# Patient Record
Sex: Female | Born: 1950 | Race: White | Hispanic: No | Smoking: Current every day smoker
Health system: Southern US, Community
[De-identification: ages and names within clinical notes are randomized; demographics above are authoritative.]

## PROBLEM LIST (undated history)

## (undated) DIAGNOSIS — J9601 Acute respiratory failure with hypoxia: Secondary | ICD-10-CM

## (undated) DIAGNOSIS — I700 Atherosclerosis of aorta: Secondary | ICD-10-CM

## (undated) DIAGNOSIS — D509 Iron deficiency anemia, unspecified: Secondary | ICD-10-CM

## (undated) DIAGNOSIS — M8580 Other specified disorders of bone density and structure, unspecified site: Secondary | ICD-10-CM

## (undated) DIAGNOSIS — J45909 Unspecified asthma, uncomplicated: Secondary | ICD-10-CM

## (undated) DIAGNOSIS — D638 Anemia in other chronic diseases classified elsewhere: Secondary | ICD-10-CM

## (undated) DIAGNOSIS — J189 Pneumonia, unspecified organism: Secondary | ICD-10-CM

## (undated) DIAGNOSIS — G5773 Causalgia of bilateral lower limbs: Secondary | ICD-10-CM

## (undated) DIAGNOSIS — I739 Peripheral vascular disease, unspecified: Secondary | ICD-10-CM

## (undated) DIAGNOSIS — F32A Depression, unspecified: Secondary | ICD-10-CM

## (undated) DIAGNOSIS — M810 Age-related osteoporosis without current pathological fracture: Secondary | ICD-10-CM

## (undated) DIAGNOSIS — G9341 Metabolic encephalopathy: Secondary | ICD-10-CM

## (undated) DIAGNOSIS — R112 Nausea with vomiting, unspecified: Secondary | ICD-10-CM

## (undated) DIAGNOSIS — I10 Essential (primary) hypertension: Secondary | ICD-10-CM

## (undated) DIAGNOSIS — J449 Chronic obstructive pulmonary disease, unspecified: Secondary | ICD-10-CM

## (undated) DIAGNOSIS — R0609 Other forms of dyspnea: Secondary | ICD-10-CM

## (undated) DIAGNOSIS — F329 Major depressive disorder, single episode, unspecified: Secondary | ICD-10-CM

## (undated) DIAGNOSIS — Z7982 Long term (current) use of aspirin: Secondary | ICD-10-CM

## (undated) DIAGNOSIS — F419 Anxiety disorder, unspecified: Secondary | ICD-10-CM

## (undated) DIAGNOSIS — R1310 Dysphagia, unspecified: Secondary | ICD-10-CM

## (undated) DIAGNOSIS — Z9889 Other specified postprocedural states: Secondary | ICD-10-CM

## (undated) DIAGNOSIS — K5790 Diverticulosis of intestine, part unspecified, without perforation or abscess without bleeding: Secondary | ICD-10-CM

## (undated) DIAGNOSIS — G5771 Causalgia of right lower limb: Secondary | ICD-10-CM

## (undated) DIAGNOSIS — E785 Hyperlipidemia, unspecified: Secondary | ICD-10-CM

## (undated) DIAGNOSIS — I255 Ischemic cardiomyopathy: Secondary | ICD-10-CM

## (undated) DIAGNOSIS — Z7902 Long term (current) use of antithrombotics/antiplatelets: Secondary | ICD-10-CM

## (undated) DIAGNOSIS — K7460 Unspecified cirrhosis of liver: Secondary | ICD-10-CM

## (undated) DIAGNOSIS — I219 Acute myocardial infarction, unspecified: Secondary | ICD-10-CM

## (undated) HISTORY — PX: CARDIAC SURGERY: SHX584

## (undated) HISTORY — DX: Depression, unspecified: F32.A

## (undated) HISTORY — DX: Major depressive disorder, single episode, unspecified: F32.9

## (undated) HISTORY — DX: Causalgia of right lower limb: G57.71

## (undated) HISTORY — PX: CORONARY ANGIOPLASTY: SHX604

## (undated) HISTORY — PX: CARDIAC CATHETERIZATION: SHX172

## (undated) HISTORY — PX: PARTIAL HYSTERECTOMY: SHX80

## (undated) HISTORY — PX: ABDOMINAL HYSTERECTOMY: SHX81

## (undated) HISTORY — PX: REDUCTION MAMMAPLASTY: SUR839

---

## 2004-06-05 ENCOUNTER — Inpatient Hospital Stay: Payer: Self-pay

## 2004-06-05 ENCOUNTER — Other Ambulatory Visit: Payer: Self-pay

## 2004-06-05 DIAGNOSIS — I214 Non-ST elevation (NSTEMI) myocardial infarction: Secondary | ICD-10-CM

## 2004-06-05 HISTORY — DX: Non-ST elevation (NSTEMI) myocardial infarction: I21.4

## 2004-06-06 ENCOUNTER — Other Ambulatory Visit: Payer: Self-pay

## 2004-06-07 DIAGNOSIS — I5030 Unspecified diastolic (congestive) heart failure: Secondary | ICD-10-CM

## 2004-06-07 HISTORY — DX: Unspecified diastolic (congestive) heart failure: I50.30

## 2004-06-08 ENCOUNTER — Other Ambulatory Visit: Payer: Self-pay

## 2004-06-08 DIAGNOSIS — I2510 Atherosclerotic heart disease of native coronary artery without angina pectoris: Secondary | ICD-10-CM

## 2004-06-08 HISTORY — DX: Atherosclerotic heart disease of native coronary artery without angina pectoris: I25.10

## 2004-07-07 ENCOUNTER — Encounter: Payer: Self-pay | Admitting: Internal Medicine

## 2004-07-29 ENCOUNTER — Encounter: Payer: Self-pay | Admitting: Internal Medicine

## 2004-11-03 ENCOUNTER — Ambulatory Visit: Payer: Self-pay | Admitting: Obstetrics and Gynecology

## 2004-11-08 ENCOUNTER — Ambulatory Visit: Payer: Self-pay | Admitting: Obstetrics and Gynecology

## 2005-01-09 ENCOUNTER — Other Ambulatory Visit: Payer: Self-pay

## 2005-01-09 ENCOUNTER — Emergency Department: Payer: Self-pay | Admitting: General Practice

## 2006-05-11 ENCOUNTER — Ambulatory Visit: Payer: Self-pay

## 2006-08-11 ENCOUNTER — Ambulatory Visit: Payer: Self-pay | Admitting: Obstetrics and Gynecology

## 2008-09-05 ENCOUNTER — Ambulatory Visit: Payer: Self-pay | Admitting: Internal Medicine

## 2009-02-11 ENCOUNTER — Ambulatory Visit: Payer: Self-pay | Admitting: Obstetrics and Gynecology

## 2009-03-20 ENCOUNTER — Ambulatory Visit: Payer: Self-pay | Admitting: Internal Medicine

## 2010-03-16 ENCOUNTER — Emergency Department: Payer: Self-pay | Admitting: Emergency Medicine

## 2010-03-20 ENCOUNTER — Emergency Department: Payer: Self-pay | Admitting: Emergency Medicine

## 2010-04-02 ENCOUNTER — Ambulatory Visit: Payer: Self-pay | Admitting: Gastroenterology

## 2010-04-13 ENCOUNTER — Ambulatory Visit: Payer: Self-pay | Admitting: Obstetrics and Gynecology

## 2011-08-25 ENCOUNTER — Emergency Department: Payer: Self-pay | Admitting: Unknown Physician Specialty

## 2011-09-15 ENCOUNTER — Ambulatory Visit: Payer: Self-pay | Admitting: Obstetrics and Gynecology

## 2013-10-31 ENCOUNTER — Ambulatory Visit: Payer: Self-pay | Admitting: Obstetrics and Gynecology

## 2013-10-31 LAB — LIPID PANEL
CHOLESTEROL: 296 mg/dL — AB (ref 0–200)
HDL Cholesterol: 53 mg/dL (ref 40–60)
Ldl Cholesterol, Calc: 197 mg/dL — ABNORMAL HIGH (ref 0–100)
Triglycerides: 229 mg/dL — ABNORMAL HIGH (ref 0–200)
VLDL CHOLESTEROL, CALC: 46 mg/dL — AB (ref 5–40)

## 2013-10-31 LAB — CBC WITH DIFFERENTIAL/PLATELET
BASOS ABS: 0.1 10*3/uL (ref 0.0–0.1)
Basophil %: 1.1 %
EOS PCT: 3.7 %
Eosinophil #: 0.2 10*3/uL (ref 0.0–0.7)
HCT: 41.4 % (ref 35.0–47.0)
HGB: 13.6 g/dL (ref 12.0–16.0)
LYMPHS PCT: 36.8 %
Lymphocyte #: 2 10*3/uL (ref 1.0–3.6)
MCH: 30 pg (ref 26.0–34.0)
MCHC: 32.9 g/dL (ref 32.0–36.0)
MCV: 91 fL (ref 80–100)
Monocyte #: 0.3 x10 3/mm (ref 0.2–0.9)
Monocyte %: 5 %
Neutrophil #: 2.9 10*3/uL (ref 1.4–6.5)
Neutrophil %: 53.4 %
Platelet: 272 10*3/uL (ref 150–440)
RBC: 4.54 10*6/uL (ref 3.80–5.20)
RDW: 13.5 % (ref 11.5–14.5)
WBC: 5.5 10*3/uL (ref 3.6–11.0)

## 2013-10-31 LAB — COMPREHENSIVE METABOLIC PANEL
ALBUMIN: 4 g/dL (ref 3.4–5.0)
ALT: 20 U/L (ref 12–78)
Alkaline Phosphatase: 82 U/L
Anion Gap: 8 (ref 7–16)
BILIRUBIN TOTAL: 0.2 mg/dL (ref 0.2–1.0)
BUN: 10 mg/dL (ref 7–18)
Calcium, Total: 9.1 mg/dL (ref 8.5–10.1)
Chloride: 101 mmol/L (ref 98–107)
Co2: 29 mmol/L (ref 21–32)
Creatinine: 0.91 mg/dL (ref 0.60–1.30)
EGFR (African American): >60
Glucose: 96 mg/dL (ref 65–99)
Osmolality: 275 (ref 275–301)
POTASSIUM: 4.5 mmol/L (ref 3.5–5.1)
SGOT(AST): 14 U/L — ABNORMAL LOW (ref 15–37)
SODIUM: 138 mmol/L (ref 136–145)
TOTAL PROTEIN: 7.2 g/dL (ref 6.4–8.2)

## 2013-10-31 LAB — TSH: Thyroid Stimulating Horm: 2.23 u[IU]/mL

## 2014-03-26 ENCOUNTER — Ambulatory Visit: Payer: Self-pay | Admitting: General Practice

## 2014-10-02 ENCOUNTER — Encounter: Payer: Self-pay | Admitting: *Deleted

## 2015-06-09 ENCOUNTER — Other Ambulatory Visit: Payer: Self-pay | Admitting: Obstetrics and Gynecology

## 2015-06-09 DIAGNOSIS — N951 Menopausal and female climacteric states: Secondary | ICD-10-CM

## 2015-07-24 ENCOUNTER — Other Ambulatory Visit: Payer: Self-pay | Admitting: Obstetrics and Gynecology

## 2015-07-24 DIAGNOSIS — Z13820 Encounter for screening for osteoporosis: Secondary | ICD-10-CM

## 2015-08-12 ENCOUNTER — Other Ambulatory Visit
Admission: RE | Admit: 2015-08-12 | Discharge: 2015-08-12 | Disposition: A | Discharge status: Home / Self Care | Payer: PPO | Source: Ambulatory Visit | Attending: Obstetrics and Gynecology | Admitting: Obstetrics and Gynecology

## 2015-08-12 DIAGNOSIS — Z131 Encounter for screening for diabetes mellitus: Secondary | ICD-10-CM | POA: Diagnosis not present

## 2015-08-12 DIAGNOSIS — Z136 Encounter for screening for cardiovascular disorders: Secondary | ICD-10-CM | POA: Diagnosis not present

## 2015-08-12 DIAGNOSIS — Z13220 Encounter for screening for lipoid disorders: Secondary | ICD-10-CM | POA: Insufficient documentation

## 2015-08-12 DIAGNOSIS — Z1329 Encounter for screening for other suspected endocrine disorder: Secondary | ICD-10-CM | POA: Insufficient documentation

## 2015-08-12 DIAGNOSIS — Z1321 Encounter for screening for nutritional disorder: Secondary | ICD-10-CM | POA: Diagnosis not present

## 2015-08-12 DIAGNOSIS — I10 Essential (primary) hypertension: Secondary | ICD-10-CM | POA: Diagnosis not present

## 2015-08-12 LAB — COMPREHENSIVE METABOLIC PANEL
ALBUMIN: 4.1 g/dL (ref 3.5–5.0)
ALT: 17 U/L (ref 14–54)
ANION GAP: 7 (ref 5–15)
AST: 19 U/L (ref 15–41)
Alkaline Phosphatase: 82 U/L (ref 38–126)
BUN: 13 mg/dL (ref 6–20)
CO2: 28 mmol/L (ref 22–32)
Calcium: 9.3 mg/dL (ref 8.9–10.3)
Chloride: 105 mmol/L (ref 101–111)
Creatinine, Ser: 0.72 mg/dL (ref 0.44–1.00)
GFR calc Af Amer: >60 mL/min (ref >60)
GFR calc non Af Amer: >60 mL/min (ref >60)
GLUCOSE: 86 mg/dL (ref 65–99)
POTASSIUM: 4.4 mmol/L (ref 3.5–5.1)
SODIUM: 140 mmol/L (ref 135–145)
Total Bilirubin: 0.6 mg/dL (ref 0.3–1.2)
Total Protein: 7 g/dL (ref 6.5–8.1)

## 2015-08-12 LAB — LIPID PANEL
CHOL/HDL RATIO: 3.2 ratio
Cholesterol: 218 mg/dL — ABNORMAL HIGH (ref 0–200)
HDL: 69 mg/dL (ref >40)
LDL CALC: 128 mg/dL — AB (ref 0–99)
Triglycerides: 106 mg/dL (ref <150)
VLDL: 21 mg/dL (ref 0–40)

## 2015-08-12 LAB — CBC WITH DIFFERENTIAL/PLATELET
BASOS ABS: 0.1 10*3/uL (ref 0–0.1)
Basophils Relative: 1 %
EOS ABS: 0.1 10*3/uL (ref 0–0.7)
EOS PCT: 2 %
HCT: 39.2 % (ref 35.0–47.0)
Hemoglobin: 13.1 g/dL (ref 12.0–16.0)
LYMPHS ABS: 1.3 10*3/uL (ref 1.0–3.6)
Lymphocytes Relative: 26 %
MCH: 29.4 pg (ref 26.0–34.0)
MCHC: 33.5 g/dL (ref 32.0–36.0)
MCV: 88 fL (ref 80.0–100.0)
MONO ABS: 0.3 10*3/uL (ref 0.2–0.9)
Monocytes Relative: 6 %
Neutro Abs: 3.3 10*3/uL (ref 1.4–6.5)
Neutrophils Relative %: 65 %
PLATELETS: 244 10*3/uL (ref 150–440)
RBC: 4.45 MIL/uL (ref 3.80–5.20)
RDW: 14.5 % (ref 11.5–14.5)
WBC: 5.1 10*3/uL (ref 3.6–11.0)

## 2015-08-12 LAB — TSH: TSH: 2.47 u[IU]/mL (ref 0.350–4.500)

## 2015-08-13 LAB — MICROALBUMIN, URINE: MICROALB UR: 3.6 ug/mL — AB

## 2015-08-13 LAB — VITAMIN D 25 HYDROXY (VIT D DEFICIENCY, FRACTURES): Vit D, 25-Hydroxy: 15.5 ng/mL — ABNORMAL LOW (ref 30.0–100.0)

## 2015-09-10 ENCOUNTER — Other Ambulatory Visit: Payer: Self-pay

## 2015-09-10 ENCOUNTER — Ambulatory Visit: Payer: Self-pay

## 2015-09-15 DIAGNOSIS — I10 Essential (primary) hypertension: Secondary | ICD-10-CM | POA: Diagnosis not present

## 2015-10-01 DIAGNOSIS — M84361A Stress fracture, right tibia, initial encounter for fracture: Secondary | ICD-10-CM | POA: Diagnosis not present

## 2015-10-01 DIAGNOSIS — M79604 Pain in right leg: Secondary | ICD-10-CM | POA: Diagnosis not present

## 2015-10-02 ENCOUNTER — Other Ambulatory Visit: Payer: Self-pay | Admitting: Orthopedic Surgery

## 2015-10-02 DIAGNOSIS — M84361A Stress fracture, right tibia, initial encounter for fracture: Secondary | ICD-10-CM

## 2015-10-23 ENCOUNTER — Ambulatory Visit
Admission: RE | Admit: 2015-10-23 | Discharge: 2015-10-23 | Disposition: A | Discharge status: Home / Self Care | Payer: PPO | Source: Ambulatory Visit | Attending: Orthopedic Surgery | Admitting: Orthopedic Surgery

## 2015-10-23 ENCOUNTER — Ambulatory Visit
Admission: RE | Admit: 2015-10-23 | Discharge: 2015-10-23 | Disposition: A | Discharge status: Home / Self Care | Payer: PPO | Source: Ambulatory Visit | Attending: Obstetrics and Gynecology | Admitting: Obstetrics and Gynecology

## 2015-10-23 ENCOUNTER — Other Ambulatory Visit: Payer: Self-pay | Admitting: Obstetrics and Gynecology

## 2015-10-23 DIAGNOSIS — Z1231 Encounter for screening mammogram for malignant neoplasm of breast: Secondary | ICD-10-CM

## 2015-10-23 DIAGNOSIS — M85851 Other specified disorders of bone density and structure, right thigh: Secondary | ICD-10-CM | POA: Diagnosis not present

## 2015-10-23 DIAGNOSIS — M810 Age-related osteoporosis without current pathological fracture: Secondary | ICD-10-CM | POA: Diagnosis not present

## 2015-10-23 DIAGNOSIS — Z13820 Encounter for screening for osteoporosis: Secondary | ICD-10-CM

## 2015-10-23 DIAGNOSIS — M7121 Synovial cyst of popliteal space [Baker], right knee: Secondary | ICD-10-CM | POA: Diagnosis not present

## 2015-10-23 DIAGNOSIS — N951 Menopausal and female climacteric states: Secondary | ICD-10-CM

## 2015-10-23 DIAGNOSIS — M84361A Stress fracture, right tibia, initial encounter for fracture: Secondary | ICD-10-CM | POA: Insufficient documentation

## 2015-11-10 DIAGNOSIS — R209 Unspecified disturbances of skin sensation: Secondary | ICD-10-CM | POA: Diagnosis not present

## 2015-11-10 DIAGNOSIS — H608X2 Other otitis externa, left ear: Secondary | ICD-10-CM | POA: Diagnosis not present

## 2015-11-12 ENCOUNTER — Other Ambulatory Visit: Payer: Self-pay | Admitting: Obstetrics and Gynecology

## 2015-11-13 ENCOUNTER — Other Ambulatory Visit: Payer: Self-pay | Admitting: Obstetrics and Gynecology

## 2015-11-13 DIAGNOSIS — R200 Anesthesia of skin: Secondary | ICD-10-CM

## 2015-11-13 DIAGNOSIS — M79604 Pain in right leg: Secondary | ICD-10-CM

## 2015-11-13 DIAGNOSIS — R202 Paresthesia of skin: Secondary | ICD-10-CM

## 2015-11-14 ENCOUNTER — Other Ambulatory Visit: Payer: Self-pay | Admitting: Obstetrics and Gynecology

## 2015-11-14 DIAGNOSIS — R202 Paresthesia of skin: Secondary | ICD-10-CM

## 2015-11-14 DIAGNOSIS — M79604 Pain in right leg: Secondary | ICD-10-CM

## 2015-11-14 DIAGNOSIS — R200 Anesthesia of skin: Secondary | ICD-10-CM

## 2016-01-14 DIAGNOSIS — I10 Essential (primary) hypertension: Secondary | ICD-10-CM | POA: Diagnosis not present

## 2016-06-01 DIAGNOSIS — I10 Essential (primary) hypertension: Secondary | ICD-10-CM | POA: Diagnosis not present

## 2016-09-14 ENCOUNTER — Encounter: Payer: Self-pay | Admitting: Emergency Medicine

## 2016-09-14 ENCOUNTER — Emergency Department: Payer: PPO

## 2016-09-14 ENCOUNTER — Emergency Department
Admission: EM | Admit: 2016-09-14 | Discharge: 2016-09-14 | Disposition: A | Discharge status: Home / Self Care | Payer: PPO | Attending: Emergency Medicine | Admitting: Emergency Medicine

## 2016-09-14 DIAGNOSIS — I10 Essential (primary) hypertension: Secondary | ICD-10-CM | POA: Diagnosis not present

## 2016-09-14 DIAGNOSIS — R0602 Shortness of breath: Secondary | ICD-10-CM | POA: Diagnosis not present

## 2016-09-14 DIAGNOSIS — R112 Nausea with vomiting, unspecified: Secondary | ICD-10-CM | POA: Diagnosis not present

## 2016-09-14 DIAGNOSIS — J40 Bronchitis, not specified as acute or chronic: Secondary | ICD-10-CM | POA: Diagnosis not present

## 2016-09-14 DIAGNOSIS — F17200 Nicotine dependence, unspecified, uncomplicated: Secondary | ICD-10-CM | POA: Insufficient documentation

## 2016-09-14 DIAGNOSIS — R197 Diarrhea, unspecified: Secondary | ICD-10-CM | POA: Insufficient documentation

## 2016-09-14 DIAGNOSIS — R05 Cough: Secondary | ICD-10-CM | POA: Diagnosis not present

## 2016-09-14 HISTORY — DX: Age-related osteoporosis without current pathological fracture: M81.0

## 2016-09-14 HISTORY — DX: Essential (primary) hypertension: I10

## 2016-09-14 MED ORDER — PROMETHAZINE HCL 25 MG PO TABS: 25.0000 mg | ORAL_TABLET | Freq: Four times a day (QID) | ORAL | 0 refills | Status: DC | PRN

## 2016-09-14 MED ORDER — PROMETHAZINE HCL 25 MG PO TABS
25.0000 mg | ORAL_TABLET | Freq: Once | ORAL | Status: AC
  Administered 2016-09-14: 25 mg via ORAL
  Filled 2016-09-14: qty 1

## 2016-09-14 MED ORDER — AZITHROMYCIN 250 MG PO TABS
500.0000 mg | ORAL_TABLET | Freq: Once | ORAL | Status: DC
Start: 2016-09-14 — End: 2016-09-14

## 2016-09-14 MED ORDER — PREDNISONE 20 MG PO TABS
60.0000 mg | ORAL_TABLET | Freq: Once | ORAL | Status: AC
  Administered 2016-09-14: 60 mg via ORAL
  Filled 2016-09-14: qty 3

## 2016-09-14 MED ORDER — DOXYCYCLINE HYCLATE 100 MG PO CAPS: 100.0000 mg | ORAL_CAPSULE | Freq: Two times a day (BID) | ORAL | 0 refills | Status: DC

## 2016-09-14 MED ORDER — PREDNISONE 50 MG PO TABS: ORAL_TABLET | ORAL | 0 refills | Status: DC

## 2016-09-14 MED ORDER — DOXYCYCLINE HYCLATE 100 MG PO TABS
100.0000 mg | ORAL_TABLET | Freq: Once | ORAL | Status: AC
  Administered 2016-09-14: 100 mg via ORAL
  Filled 2016-09-14: qty 1

## 2016-09-14 NOTE — ED Notes (Signed)
Pt discharged home after verbalizing understanding of discharge instructions; nad noted. 

## 2016-09-14 NOTE — ED Notes (Addendum)
Pt presents with cough x 2 weeks and diarrhea x 6 days. Pt has hx of copd and is a smoker (1.5 packs per day). Pt also c/o not being able to eat for a week. She states she has had some vomiting (last time Saturday), but that she has had continual diarrhea since Wednesday. Pt states she has had 12 episodes of diarrhea in last 24 hours. Pt alert & oriented with NAD noted.  Addend: pt states she DOES NOT smoke a pack and a half of cigarettes a day; she only smokes about a pack.

## 2016-09-14 NOTE — ED Triage Notes (Signed)
Patient presents to the ED with cough and congestion with shortness of breath.  Patient states, "my grandkids have been sick lately and I'm worried I have the flu."  Patient states cough and congestion began approx. 1 week ago.  Patient denies fever.  Patient is speaking in full sentences without obvious distress.  Patient has raspy cough.  Patient also has bruising to her right forehead at this time.  Patient states approx. 2 weeks ago she walked into a door frame accidentally.  Patient denies any issues post fall.

## 2016-09-14 NOTE — ED Provider Notes (Signed)
[facility] Emergency Department Provider Note  ____________________________________________   First MD Initiated Contact with Patient 09/14/16 1614     (approximate)  I have reviewed the triage vital signs and the nursing notes.   HISTORY  Chief Complaint Shortness of Breath and Nasal Congestion   HPI Wendy Arias is a 66 y.o. female with a history of hypertension and osteoporosis was present. Emergency department today with 2 weeks of cough, nasal congestion as well as nausea, vomiting and diarrhea. She says that she only has diarrhea and nausea and vomiting when she eats. Says she has not had anything to eat or drink today but says she is ready to eat at this time and would like to when she gets home. She does request Phenergan for her nausea.  Denies any pain at this time. Says that her most concerning symptom is her shortness of breath and a cough. Says that she has been using her rescue inhaler at home. Also sick contacts of her children.   Past Medical History:  Diagnosis Date  . Hypertension   . Osteoporosis     There are no active problems to display for this patient.   Past Surgical History:  Procedure Laterality Date  . ABDOMINAL HYSTERECTOMY    . CARDIAC SURGERY    . REDUCTION MAMMAPLASTY      Prior to Admission medications   Not on File    Allergies Penicillins  Family History  Problem Relation Age of Onset  . Breast cancer Mother 38  . Breast cancer Maternal Aunt 70  . Breast cancer Other 57    Social History Social History  Substance Use Topics  . Smoking status: Current Some Day Smoker  . Smokeless tobacco: Never Used  . Alcohol use Yes     Comment: rarely    Review of Systems Constitutional: No fever/chills Eyes: No visual changes. ENT: No sore throat. Cardiovascular: Denies chest pain. Respiratory: as above Gastrointestinal: No abdominal pain.  No nausea, no vomiting.   Genitourinary: Negative for  dysuria. Musculoskeletal: Negative for back pain. Skin: Negative for rash. Neurological: Negative for headaches, focal weakness or numbness.  10-point ROS otherwise negative.  ____________________________________________   PHYSICAL EXAM:  VITAL SIGNS: ED Triage Vitals  Enc Vitals Group     BP 09/14/16 1544 (!) 120/99     Pulse Rate 09/14/16 1544 87     Resp 09/14/16 1544 18     Temp 09/14/16 1544 97.8 F (36.6 C)     Temp Source 09/14/16 1544 Oral     SpO2 09/14/16 1544 95 %     Weight 09/14/16 1545 102 lb (46.3 kg)     Height 09/14/16 1545 5\' 2"  (1.575 m)     Head Circumference --      Peak Flow --      Pain Score --      Pain Loc --      Pain Edu? --      Excl. in [place]? --     Constitutional: Alert and oriented. Well appearing and in no acute distress. Eyes: Conjunctivae are normal. PERRL. EOMI. Head: Atraumatic. Nose: No congestion/rhinnorhea. Mouth/Throat: Mucous membranes are moist.  Oropharynx non-erythematous. Neck: No stridor.   Cardiovascular: Normal rate, regular rhythm. Grossly normal heart sounds.   Respiratory: Normal respiratory effort.  No retractions. Lungs CTAB. Gastrointestinal: Soft and nontender. No distention.  Musculoskeletal: No lower extremity tenderness nor edema.  No joint effusions. Neurologic:  Normal speech and language. No gross  focal neurologic deficits are appreciated. No gait instability. Skin:  Skin is warm, dry and intact. No rash noted. Psychiatric: Mood and affect are normal. Speech and behavior are normal.  ____________________________________________   LABS (all labs ordered are listed, but only abnormal results are displayed)  Labs Reviewed - No data to display ____________________________________________  EKG   ____________________________________________  RADIOLOGY DG Chest 2 View (Final result)  Result time 09/14/16 16:46:57  Final result by Lavonia Dana, MD (09/14/16 16:46:57)           Narrative:   CLINICAL  DATA: Cough for 2 weeks with nausea and vomiting for 1 week, history COPD, hypertension, smoker, MI and stent placement  EXAM: CHEST 2 VIEW  COMPARISON: 03/26/2014  FINDINGS: Normal heart size, mediastinal contours, and pulmonary vascularity.  Atherosclerotic calcification aorta.  Linear subsegmental atelectasis versus scarring at RIGHT base.  Lungs emphysematous but otherwise clear.  No pleural effusion or pneumothorax.  Osseous demineralization without acute osseous findings  IMPRESSION: Emphysematous changes with linear atelectasis versus scarring at RIGHT lung base.  No acute infiltrate.   Electronically Signed By: Lavonia Dana M.D. On: 09/14/2016 16:46           ____________________________________________   PROCEDURES  Procedure(s) performed:   Procedures  Critical Care performed:   ____________________________________________   INITIAL IMPRESSION / ASSESSMENT AND PLAN / ED COURSE  Pertinent labs & imaging results that were available during my care of the patient were reviewed by me and considered in my medical decision making (see chart for details).  Because of the duration of the patient's symptoms I'll be treating her with doxycycline as well as prednisone and Phenergan when necessary. She says that she is ready to eat normally distention she gets home. We'll discharge to home with her son. She is understanding of the plan and willing to comply.      ____________________________________________   FINAL CLINICAL IMPRESSION(S) / ED DIAGNOSES  Bronchitis. Nausea vomiting and diarrhea.    NEW MEDICATIONS STARTED DURING THIS VISIT:  New Prescriptions   No medications on file     Note:  This document was prepared using Dragon voice recognition software and may include unintentional dictation errors.    Orbie Pyo, MD 09/14/16 [phone]

## 2016-09-21 DIAGNOSIS — I10 Essential (primary) hypertension: Secondary | ICD-10-CM | POA: Diagnosis not present

## 2016-11-30 DIAGNOSIS — R197 Diarrhea, unspecified: Secondary | ICD-10-CM | POA: Diagnosis not present

## 2017-04-12 ENCOUNTER — Encounter
Admission: RE | Disposition: A | Discharge status: Home / Self Care | Payer: Self-pay | Source: Ambulatory Visit | Attending: Gastroenterology

## 2017-04-12 ENCOUNTER — Ambulatory Visit: Payer: PPO | Admitting: Anesthesiology

## 2017-04-12 ENCOUNTER — Ambulatory Visit
Admission: RE | Admit: 2017-04-12 | Discharge: 2017-04-12 | Disposition: A | Discharge status: Home / Self Care | Payer: PPO | Source: Ambulatory Visit | Attending: Gastroenterology | Admitting: Gastroenterology

## 2017-04-12 ENCOUNTER — Encounter: Payer: Self-pay | Admitting: Anesthesiology

## 2017-04-12 DIAGNOSIS — Z90710 Acquired absence of both cervix and uterus: Secondary | ICD-10-CM | POA: Insufficient documentation

## 2017-04-12 DIAGNOSIS — I2510 Atherosclerotic heart disease of native coronary artery without angina pectoris: Secondary | ICD-10-CM | POA: Insufficient documentation

## 2017-04-12 DIAGNOSIS — R197 Diarrhea, unspecified: Secondary | ICD-10-CM | POA: Diagnosis not present

## 2017-04-12 DIAGNOSIS — K529 Noninfective gastroenteritis and colitis, unspecified: Secondary | ICD-10-CM | POA: Diagnosis not present

## 2017-04-12 DIAGNOSIS — Z7982 Long term (current) use of aspirin: Secondary | ICD-10-CM | POA: Insufficient documentation

## 2017-04-12 DIAGNOSIS — Q438 Other specified congenital malformations of intestine: Secondary | ICD-10-CM | POA: Insufficient documentation

## 2017-04-12 DIAGNOSIS — K621 Rectal polyp: Secondary | ICD-10-CM | POA: Diagnosis not present

## 2017-04-12 DIAGNOSIS — I10 Essential (primary) hypertension: Secondary | ICD-10-CM | POA: Diagnosis not present

## 2017-04-12 DIAGNOSIS — K635 Polyp of colon: Secondary | ICD-10-CM | POA: Diagnosis not present

## 2017-04-12 DIAGNOSIS — F17200 Nicotine dependence, unspecified, uncomplicated: Secondary | ICD-10-CM | POA: Insufficient documentation

## 2017-04-12 DIAGNOSIS — Z79899 Other long term (current) drug therapy: Secondary | ICD-10-CM | POA: Diagnosis not present

## 2017-04-12 DIAGNOSIS — K5730 Diverticulosis of large intestine without perforation or abscess without bleeding: Secondary | ICD-10-CM | POA: Insufficient documentation

## 2017-04-12 DIAGNOSIS — K5790 Diverticulosis of intestine, part unspecified, without perforation or abscess without bleeding: Secondary | ICD-10-CM | POA: Diagnosis not present

## 2017-04-12 DIAGNOSIS — D128 Benign neoplasm of rectum: Secondary | ICD-10-CM | POA: Diagnosis not present

## 2017-04-12 DIAGNOSIS — Z880 Allergy status to penicillin: Secondary | ICD-10-CM | POA: Diagnosis not present

## 2017-04-12 DIAGNOSIS — D123 Benign neoplasm of transverse colon: Secondary | ICD-10-CM | POA: Diagnosis not present

## 2017-04-12 DIAGNOSIS — M810 Age-related osteoporosis without current pathological fracture: Secondary | ICD-10-CM | POA: Insufficient documentation

## 2017-04-12 DIAGNOSIS — D124 Benign neoplasm of descending colon: Secondary | ICD-10-CM | POA: Diagnosis not present

## 2017-04-12 HISTORY — PX: COLONOSCOPY WITH PROPOFOL: SHX5780

## 2017-04-12 SURGERY — COLONOSCOPY WITH PROPOFOL
Anesthesia: General

## 2017-04-12 MED ORDER — LIDOCAINE HCL (PF) 2 % IJ SOLN
INTRAMUSCULAR | Status: AC
  Filled 2017-04-12: qty 10

## 2017-04-12 MED ORDER — PHENYLEPHRINE HCL 10 MG/ML IJ SOLN
INTRAMUSCULAR | Status: AC
  Filled 2017-04-12: qty 1

## 2017-04-12 MED ORDER — PROPOFOL 10 MG/ML IV BOLUS
INTRAVENOUS | Status: DC | PRN
  Administered 2017-04-12: 80 mg via INTRAVENOUS

## 2017-04-12 MED ORDER — GLYCOPYRROLATE 0.2 MG/ML IJ SOLN
INTRAMUSCULAR | Status: AC
  Filled 2017-04-12: qty 1

## 2017-04-12 MED ORDER — PROPOFOL 500 MG/50ML IV EMUL
INTRAVENOUS | Status: DC | PRN
  Administered 2017-04-12: 120 ug/kg/min via INTRAVENOUS

## 2017-04-12 MED ORDER — FENTANYL CITRATE (PF) 100 MCG/2ML IJ SOLN
INTRAMUSCULAR | Status: AC
  Filled 2017-04-12: qty 2

## 2017-04-12 MED ORDER — PROPOFOL 500 MG/50ML IV EMUL
INTRAVENOUS | Status: AC
  Filled 2017-04-12: qty 50

## 2017-04-12 MED ORDER — FENTANYL CITRATE (PF) 100 MCG/2ML IJ SOLN
INTRAMUSCULAR | Status: DC | PRN
  Administered 2017-04-12: 50 ug via INTRAVENOUS

## 2017-04-12 MED ORDER — PHENYLEPHRINE HCL 10 MG/ML IJ SOLN
INTRAMUSCULAR | Status: DC | PRN
  Administered 2017-04-12: 100 ug via INTRAVENOUS
  Administered 2017-04-12 (×2): 50 ug via INTRAVENOUS
  Administered 2017-04-12: 100 ug via INTRAVENOUS

## 2017-04-12 MED ORDER — LIDOCAINE 2% (20 MG/ML) 5 ML SYRINGE
INTRAMUSCULAR | Status: DC | PRN
  Administered 2017-04-12: 30 mg via INTRAVENOUS

## 2017-04-12 MED ORDER — SODIUM CHLORIDE 0.9 % IV SOLN
INTRAVENOUS | Status: DC
  Administered 2017-04-12: 07:00:00 via INTRAVENOUS

## 2017-04-12 MED ORDER — EPHEDRINE SULFATE 50 MG/ML IJ SOLN
INTRAMUSCULAR | Status: AC
  Filled 2017-04-12: qty 1

## 2017-04-12 MED ORDER — SODIUM CHLORIDE 0.9 % IV SOLN: INTRAVENOUS | Status: DC

## 2017-04-12 MED ORDER — PROPOFOL 10 MG/ML IV BOLUS
INTRAVENOUS | Status: AC
  Filled 2017-04-12: qty 20

## 2017-04-12 NOTE — Op Note (Signed)
[facility] Patient Name: Wendy Arias Procedure Date: 04/12/2017 7:33 AM MRN: [id number] Account #: [id number] Date of Birth: 09/19/1950 Admit Type: Outpatient Age: 66 Room: [facility] ENDO ROOM 3 Gender: Female Note Status: Finalized Procedure:            Colonoscopy Indications:          Chronic diarrhea Providers:            Lollie Sails, MD Referring MD:         Shelby Mattocks. Georga Bora, MD (Referring MD) Medicines:            Monitored Anesthesia Care Complications:        No immediate complications. Procedure:            Pre-Anesthesia Assessment:                       - ASA Grade Assessment: III - A patient with severe                        systemic disease.                       After obtaining informed consent, the colonoscope was                        passed under direct vision. Throughout the procedure,                        the patient's blood pressure, pulse, and oxygen                        saturations were monitored continuously. The                        Colonoscope was introduced through the anus and                        advanced to the the terminal ileum. The colonoscopy was                        unusually difficult due to a tortuous colon. Successful                        completion of the procedure was aided by changing the                        patient to a supine position, changing the patient to a                        prone position, using manual pressure and withdrawing                        and reinserting the scope. The patient tolerated the                        procedure well. The quality of the bowel preparation                        was fair. Findings:      Multiple small to medium-mouthed diverticula were found in the  sigmoid       colon and descending colon.      Biopsies for histology were taken with a cold forceps from the right       colon and left colon for evaluation of microscopic colitis.  Two sessile polyps were found in the transverse colon. The polyps were 3       to 4 mm in size. These polyps were removed with a cold snare. Resection       and retrieval were complete.      A 6 mm polyp was found in the transverse colon. The polyp was sessile.       The polyp was removed with a cold snare. Resection and retrieval were       complete.      A 7 mm polyp was found in the descending colon. The polyp was sessile.       The polyp was removed with a cold snare. Resection and retrieval were       complete.      Three sessile polyps were found in the rectum. The polyps were 1 to 3 mm       in size. These polyps were removed with a cold biopsy forceps. Resection       and retrieval were complete. Impression:           - Preparation of the colon was fair.                       - Diverticulosis in the sigmoid colon and in the                        descending colon.                       - Two 3 to 4 mm polyps in the transverse colon, removed                        with a cold snare. Resected and retrieved.                       - One 6 mm polyp in the transverse colon, removed with                        a cold snare. Resected and retrieved.                       - One 7 mm polyp in the descending colon, removed with                        a cold snare. Resected and retrieved.                       - Three 1 to 3 mm polyps in the rectum, removed with a                        cold biopsy forceps. Resected and retrieved.                       - Biopsies were taken with a cold forceps from the  right colon and left colon for evaluation of                        microscopic colitis. Recommendation:       - Telephone GI clinic for pathology results in 1 week. Procedure Code(s):    --- Professional ---                       [phone], Colonoscopy, flexible; with removal of tumor(s),                        polyp(s), or other lesion(s) by snare technique                        45380, 29, Colonoscopy, flexible; with biopsy, single                        or multiple Diagnosis Code(s):    --- Professional ---                       D12.3, Benign neoplasm of transverse colon (hepatic                        flexure or splenic flexure)                       K62.1, Rectal polyp                       D12.4, Benign neoplasm of descending colon                       K52.9, Noninfective gastroenteritis and colitis,                        unspecified                       K57.30, Diverticulosis of large intestine without                        perforation or abscess without bleeding CPT copyright 2016 American Medical [employer]. All rights reserved. The codes documented in this report are preliminary and upon coder review may  be revised to meet current compliance requirements. Lollie Sails, MD 04/12/2017 8:41:52 AM This report has been signed electronically. Number of Addenda: 0 Note Initiated On: 04/12/2017 7:33 AM Scope Withdrawal Time: 0 hours 21 minutes 8 seconds  Total Procedure Duration: 0 hours 47 minutes 32 seconds       [facility]

## 2017-04-12 NOTE — H&P (Signed)
Outpatient short stay form Pre-procedure 04/12/2017 7:33 AM Wendy Sails MD  Primary Physician: Dr. Quay Burow  Reason for visit:  colonoscopy  History of present illness:  Patient is a 66 year old female presenting today as above. She has a history of chronic intermittent diarrhea. There seems to be multiple dietary triggers but at times anythiing cause her episode of She does not see blood in the stools. She does have some abdominal discomfort crampiness prior to the bowel movement this relieves with the bowel movement.  Patient tolerated her prep well. She does take a 325 mg aspirin daily but has not for at least a week to 2 weeks. She denies taking any Plavix although this is listed as one of her medications. She denies taking any other aspirin products.    Current Facility-Administered Medications:  .  0.9 %  sodium chloride infusion, , Intravenous, Continuous, Wendy Sails, MD, Last Rate: 20 mL/hr at 04/12/17 0724 .  0.9 %  sodium chloride infusion, , Intravenous, Continuous, Wendy Sails, MD  Prescriptions Prior to Admission  Medication Sig Dispense Refill Last Dose  . ALPRAZolam (XANAX) 1 MG tablet Take 1 mg by mouth 2 (two) times daily as needed for anxiety.   Past Month at Unknown time  . aspirin 325 MG tablet Take 325 mg by mouth daily.   Past Month at Unknown time  . ibandronate (BONIVA) 150 MG tablet Take 150 mg by mouth every 30 (thirty) days. Take in the morning with a full glass of water, on an empty stomach, and do not take anything else by mouth or lie down for the next 30 min.   03/13/2017  . metoprolol succinate (TOPROL-XL) 50 MG 24 hr tablet Take 50 mg by mouth daily. Take with or immediately following a meal.   04/11/2017 at Unknown time  . simvastatin (ZOCOR) 40 MG tablet Take 40 mg by mouth daily.   alprazolam  . vitamin B-12 (CYANOCOBALAMIN) 100 MCG tablet Take 100 mcg by mouth daily.   04/01/2017  . doxycycline (VIBRAMYCIN) 100 MG capsule  Take 1 capsule (100 mg total) by mouth 2 (two) times daily. 20 capsule 0   . predniSONE (DELTASONE) 50 MG tablet Take 1 tab PO daily for 5 days (Patient not taking: Reported on 04/12/2017) 5 tablet 0 Not Taking at Unknown time  . promethazine (PHENERGAN) 25 MG tablet Take 1 tablet (25 mg total) by mouth every 6 (six) hours as needed for nausea or vomiting. (Patient not taking: Reported on 04/12/2017) 15 tablet 0 Not Taking at Unknown time     Allergies  Allergen Reactions  . Penicillins      Past Medical History:  Diagnosis Date  . Hypertension   . Osteoporosis     Review of systems:      Physical Exam    Heart and lungs: regular rate and rhythm without rub or gallop, lungs are bilaterally clear.    HEENT: normocephalic atraumatic eyes are anicteric    Other:     Pertinant exam for procedure: soft nontender nondistended bowel sounds positive normoactive    Planned proceedures: colonoscopy and indicated procedures. I have discussed the risks benefits and complications of procedures to include not limited to bleeding, infection, perforation and the risk of sedation and the patient wishes to proceed.    Wendy Sails, MD Gastroenterology 04/12/2017  7:33 AM

## 2017-04-12 NOTE — Transfer of Care (Signed)
Immediate Anesthesia Transfer of Care Note  Patient: Wendy Arias  Procedure(s) Performed: COLONOSCOPY WITH PROPOFOL (N/A )  Patient Location: PACU and Endoscopy Unit  Anesthesia Type:General  Level of Consciousness: awake and drowsy  Airway & Oxygen Therapy: Patient Spontanous Breathing and Patient connected to nasal cannula oxygen  Post-op Assessment: Report given to RN and Post -op Vital signs reviewed and stable  Post vital signs: Reviewed and stable  Last Vitals:  Vitals:   04/12/17 0708  BP: (!) 158/87  Pulse: 87  Resp: 18  Temp: (!) 35.7 C  SpO2: 97%    Last Pain:  Vitals:   04/12/17 0708  TempSrc: Tympanic         Complications: No apparent anesthesia complications

## 2017-04-12 NOTE — Anesthesia Post-op Follow-up Note (Signed)
Anesthesia QCDR form completed.        

## 2017-04-12 NOTE — Anesthesia Postprocedure Evaluation (Signed)
Anesthesia Post Note  Patient: SHERMAN LIPUMA  Procedure(s) Performed: COLONOSCOPY WITH PROPOFOL (N/A )  Patient location during evaluation: Endoscopy Anesthesia Type: General Level of consciousness: awake and alert Pain management: pain level controlled Vital Signs Assessment: post-procedure vital signs reviewed and stable Respiratory status: spontaneous breathing, nonlabored ventilation, respiratory function stable and patient connected to nasal cannula oxygen Cardiovascular status: blood pressure returned to baseline and stable Postop Assessment: no apparent nausea or vomiting Anesthetic complications: no     Last Vitals:  Vitals:   04/12/17 0901 04/12/17 0911  BP: (!) 152/106 (!) 130/103  Pulse: 68 70  Resp: (!) 22 18  Temp:    SpO2: 99% 98%    Last Pain:  Vitals:   04/12/17 0841  TempSrc: Tympanic                 Precious Haws Nel Stoneking

## 2017-04-12 NOTE — Anesthesia Preprocedure Evaluation (Signed)
Anesthesia Evaluation  Patient identified by MRN, date of birth, ID band Patient awake    Reviewed: Allergy & Precautions, H&P , NPO status , Patient's Chart, lab work & pertinent test results  History of Anesthesia Complications Negative for: history of anesthetic complications  Airway Mallampati: III  TM Distance: <3 FB Neck ROM: limited    Dental  (+) Poor Dentition, Chipped, Missing, Upper Dentures, Partial Lower   Pulmonary neg shortness of breath, Current Smoker,           Cardiovascular Exercise Tolerance: Good hypertension, (-) angina+ CAD and + Cardiac Stents  (-) DOE      Neuro/Psych negative neurological ROS  negative psych ROS   GI/Hepatic negative GI ROS, Neg liver ROS, neg GERD  ,  Endo/Other  negative endocrine ROS  Renal/GU negative Renal ROS  negative genitourinary   Musculoskeletal   Abdominal   Peds  Hematology negative hematology ROS (+)   Anesthesia Other Findings Past Medical History: No date: Hypertension No date: Osteoporosis  Past Surgical History: No date: ABDOMINAL HYSTERECTOMY No date: CARDIAC SURGERY No date: REDUCTION MAMMAPLASTY  BMI    Body Mass Index:  18.66 kg/m      Reproductive/Obstetrics negative OB ROS                             Anesthesia Physical Anesthesia Plan  ASA: III  Anesthesia Plan: General   Post-op Pain Management:    Induction: Intravenous  PONV Risk Score and Plan: Propofol infusion  Airway Management Planned: Natural Airway and Nasal Cannula  Additional Equipment:   Intra-op Plan:   Post-operative Plan:   Informed Consent: I have reviewed the patients History and Physical, chart, labs and discussed the procedure including the risks, benefits and alternatives for the proposed anesthesia with the patient or authorized representative who has indicated his/her understanding and acceptance.   Dental Advisory  Given  Plan Discussed with: Anesthesiologist, CRNA and Surgeon  Anesthesia Plan Comments: (Patient consented for risks of anesthesia including but not limited to:  - adverse reactions to medications - risk of intubation if required - damage to teeth, lips or other oral mucosa - sore throat or hoarseness - Damage to heart, brain, lungs or loss of life  Patient voiced understanding.)        Anesthesia Quick Evaluation

## 2017-04-13 ENCOUNTER — Encounter: Payer: Self-pay | Admitting: Gastroenterology

## 2017-04-14 LAB — SURGICAL PATHOLOGY

## 2017-04-27 ENCOUNTER — Other Ambulatory Visit: Payer: Self-pay | Admitting: Obstetrics and Gynecology

## 2017-04-27 DIAGNOSIS — Z1231 Encounter for screening mammogram for malignant neoplasm of breast: Secondary | ICD-10-CM

## 2017-04-27 DIAGNOSIS — Z13820 Encounter for screening for osteoporosis: Secondary | ICD-10-CM

## 2017-05-13 ENCOUNTER — Ambulatory Visit: Payer: PPO | Admitting: Psychology

## 2017-05-13 DIAGNOSIS — F322 Major depressive disorder, single episode, severe without psychotic features: Secondary | ICD-10-CM | POA: Diagnosis not present

## 2017-05-23 ENCOUNTER — Ambulatory Visit: Payer: PPO | Admitting: Psychology

## 2017-05-30 ENCOUNTER — Ambulatory Visit: Payer: PPO | Admitting: Psychology

## 2017-05-30 DIAGNOSIS — F322 Major depressive disorder, single episode, severe without psychotic features: Secondary | ICD-10-CM | POA: Diagnosis not present

## 2017-06-01 ENCOUNTER — Ambulatory Visit: Payer: PPO | Admitting: Psychology

## 2017-06-13 ENCOUNTER — Ambulatory Visit: Payer: PPO | Admitting: Psychology

## 2017-06-13 DIAGNOSIS — F322 Major depressive disorder, single episode, severe without psychotic features: Secondary | ICD-10-CM

## 2017-06-14 DIAGNOSIS — Z87898 Personal history of other specified conditions: Secondary | ICD-10-CM | POA: Insufficient documentation

## 2017-06-14 DIAGNOSIS — K580 Irritable bowel syndrome with diarrhea: Secondary | ICD-10-CM | POA: Diagnosis not present

## 2017-06-14 DIAGNOSIS — D369 Benign neoplasm, unspecified site: Secondary | ICD-10-CM | POA: Diagnosis not present

## 2017-06-29 ENCOUNTER — Ambulatory Visit: Payer: PPO | Admitting: Psychology

## 2017-08-13 DIAGNOSIS — H6693 Otitis media, unspecified, bilateral: Secondary | ICD-10-CM | POA: Diagnosis not present

## 2017-08-13 DIAGNOSIS — B349 Viral infection, unspecified: Secondary | ICD-10-CM | POA: Diagnosis not present

## 2017-08-13 DIAGNOSIS — H6123 Impacted cerumen, bilateral: Secondary | ICD-10-CM | POA: Diagnosis not present

## 2017-08-16 ENCOUNTER — Ambulatory Visit: Payer: PPO | Admitting: Psychology

## 2017-09-09 DIAGNOSIS — I10 Essential (primary) hypertension: Secondary | ICD-10-CM | POA: Diagnosis not present

## 2017-11-02 ENCOUNTER — Encounter: Payer: Self-pay | Admitting: Emergency Medicine

## 2017-11-02 ENCOUNTER — Other Ambulatory Visit: Payer: Self-pay

## 2017-11-02 ENCOUNTER — Emergency Department: Payer: PPO

## 2017-11-02 ENCOUNTER — Inpatient Hospital Stay
Admission: EM | Admit: 2017-11-02 | Discharge: 2017-11-04 | DRG: 896 | Disposition: A | Discharge status: Home / Self Care | Payer: PPO | Attending: Specialist | Admitting: Specialist

## 2017-11-02 DIAGNOSIS — Z7982 Long term (current) use of aspirin: Secondary | ICD-10-CM

## 2017-11-02 DIAGNOSIS — R4020 Unspecified coma: Secondary | ICD-10-CM | POA: Diagnosis not present

## 2017-11-02 DIAGNOSIS — R4781 Slurred speech: Secondary | ICD-10-CM | POA: Diagnosis not present

## 2017-11-02 DIAGNOSIS — G9340 Encephalopathy, unspecified: Secondary | ICD-10-CM

## 2017-11-02 DIAGNOSIS — M810 Age-related osteoporosis without current pathological fracture: Secondary | ICD-10-CM | POA: Diagnosis present

## 2017-11-02 DIAGNOSIS — F13239 Sedative, hypnotic or anxiolytic dependence with withdrawal, unspecified: Secondary | ICD-10-CM | POA: Diagnosis not present

## 2017-11-02 DIAGNOSIS — E785 Hyperlipidemia, unspecified: Secondary | ICD-10-CM | POA: Diagnosis not present

## 2017-11-02 DIAGNOSIS — F329 Major depressive disorder, single episode, unspecified: Secondary | ICD-10-CM | POA: Diagnosis not present

## 2017-11-02 DIAGNOSIS — R443 Hallucinations, unspecified: Secondary | ICD-10-CM | POA: Diagnosis present

## 2017-11-02 DIAGNOSIS — F17200 Nicotine dependence, unspecified, uncomplicated: Secondary | ICD-10-CM | POA: Diagnosis not present

## 2017-11-02 DIAGNOSIS — F419 Anxiety disorder, unspecified: Secondary | ICD-10-CM | POA: Diagnosis present

## 2017-11-02 DIAGNOSIS — F1010 Alcohol abuse, uncomplicated: Secondary | ICD-10-CM | POA: Diagnosis not present

## 2017-11-02 DIAGNOSIS — I4581 Long QT syndrome: Secondary | ICD-10-CM | POA: Diagnosis not present

## 2017-11-02 DIAGNOSIS — F13939 Sedative, hypnotic or anxiolytic use, unspecified with withdrawal, unspecified: Secondary | ICD-10-CM

## 2017-11-02 DIAGNOSIS — R4182 Altered mental status, unspecified: Secondary | ICD-10-CM | POA: Diagnosis not present

## 2017-11-02 DIAGNOSIS — G92 Toxic encephalopathy: Secondary | ICD-10-CM | POA: Diagnosis present

## 2017-11-02 DIAGNOSIS — Z79899 Other long term (current) drug therapy: Secondary | ICD-10-CM

## 2017-11-02 DIAGNOSIS — F10239 Alcohol dependence with withdrawal, unspecified: Secondary | ICD-10-CM | POA: Diagnosis not present

## 2017-11-02 DIAGNOSIS — I10 Essential (primary) hypertension: Secondary | ICD-10-CM | POA: Diagnosis present

## 2017-11-02 DIAGNOSIS — F13231 Sedative, hypnotic or anxiolytic dependence with withdrawal delirium: Secondary | ICD-10-CM | POA: Diagnosis not present

## 2017-11-02 DIAGNOSIS — N179 Acute kidney failure, unspecified: Secondary | ICD-10-CM | POA: Diagnosis not present

## 2017-11-02 MED ORDER — HALOPERIDOL LACTATE 5 MG/ML IJ SOLN
5.0000 mg | Freq: Once | INTRAMUSCULAR | Status: AC
  Administered 2017-11-03: 5 mg via INTRAVENOUS
  Filled 2017-11-02: qty 1

## 2017-11-02 NOTE — ED Provider Notes (Addendum)
[facility] Emergency Department Provider Note  ____________________________________________   First MD Initiated Contact with Patient 11/02/17 2258     (approximate)  I have reviewed the triage vital signs and the nursing notes.   HISTORY  Chief Complaint Altered Mental Status  Level 5 exemption history limited by the patient's clinical condition  HPI Wendy Arias is a 67 y.o. female is brought to the emergency department via EMS after family noted that the patient had been demonstrating bizarre behavior for the past 3 hours or so.  The patient lives at home alone and has a long-standing history of depression.  Family found multiple prescription pill bottles open and the patient lying on the floor.  She was rather uncooperative with EMS in route.  She had normal vital signs and normal blood sugar.  Patient herself is unable to provide any meaningful history.  Past Medical History:  Diagnosis Date  . Hypertension   . Osteoporosis     Patient Active Problem List   Diagnosis Date Noted  . Acute encephalopathy 11/03/2017    Past Surgical History:  Procedure Laterality Date  . ABDOMINAL HYSTERECTOMY    . CARDIAC SURGERY    . COLONOSCOPY WITH PROPOFOL N/A 04/12/2017   Procedure: COLONOSCOPY WITH PROPOFOL;  Surgeon: Lollie Sails, MD;  Location: [facility] ENDOSCOPY;  Service: Endoscopy;  Laterality: N/A;  . REDUCTION MAMMAPLASTY      Prior to Admission medications   Medication Sig Start Date End Date Taking? Authorizing Provider  alprazolam Duanne Moron) 2 MG tablet Take 1 mg by mouth 4 (four) times daily. 10/05/17  Yes [provider]  busPIRone (BUSPAR) 10 MG tablet Take 10 mg by mouth 2 (two) times daily. 11/01/17  Yes [provider]  citalopram (CELEXA) 20 MG tablet Take 20 mg by mouth daily. 11/01/17  Yes [provider]  dicyclomine (BENTYL) 10 MG capsule Take 10 mg by mouth every 6 (six) hours as needed for spasms. 11/01/17  Yes  [provider]  metoprolol succinate (TOPROL-XL) 50 MG 24 hr tablet Take 50 mg by mouth daily. Take with or immediately following a meal.   Yes [provider]  zolpidem (AMBIEN) 10 MG tablet Take 5-10 mg by mouth at bedtime and may repeat dose one time if needed. 10mg  at bedtime, may repeat 5mg  if needed 10/05/17  Yes [provider]  aspirin 325 MG tablet Take 325 mg by mouth daily.    [provider]  ibandronate (BONIVA) 150 MG tablet Take 150 mg by mouth every 30 (thirty) days. Take in the morning with a full glass of water, on an empty stomach, and do not take anything else by mouth or lie down for the next 30 min.    [provider]  predniSONE (DELTASONE) 50 MG tablet Take 1 tab PO daily for 5 days Patient not taking: Reported on 04/12/2017 09/14/16   Orbie Pyo, MD  promethazine (PHENERGAN) 25 MG tablet Take 1 tablet (25 mg total) by mouth every 6 (six) hours as needed for nausea or vomiting. Patient not taking: Reported on 04/12/2017 09/14/16   Orbie Pyo, MD  simvastatin (ZOCOR) 40 MG tablet Take 40 mg by mouth daily.    [provider]  vitamin B-12 (CYANOCOBALAMIN) 100 MCG tablet Take 100 mcg by mouth daily.    [provider]    Allergies Penicillins  Family History  Problem Relation Age of Onset  . Breast cancer Mother 65  . Breast  cancer Maternal Aunt 70  . Breast cancer Other 60    Social History Social History   Tobacco Use  . Smoking status: Current Some Day Smoker  . Smokeless tobacco: Never Used  Substance Use Topics  . Alcohol use: Yes    Comment: rarely  . Drug use: Not on file    Review of Systems Level 5 exemption history limited by the patient's clinical condition  ____________________________________________   PHYSICAL EXAM:  VITAL SIGNS: ED Triage Vitals  Enc Vitals Group     BP      Pulse      Resp      Temp      Temp src      SpO2      Weight       Height      Head Circumference      Peak Flow      Pain Score      Pain Loc      Pain Edu?      Excl. in [place]?     Constitutional: Alert and oriented x0.  Does not know her name, year, where she is, or why she is here.  She appears frustrated and is confabulating.  She answers questions that have not been asked and seems to be making no sense Eyes: PERRL EOMI. pupils are dilated and reactive and brisk Head: Atraumatic. Nose: No congestion/rhinnorhea. Mouth/Throat: No trismus Neck: No stridor.   Cardiovascular: Tachycardic rate, regular rhythm. Grossly normal heart sounds.  Good peripheral circulation. Respiratory: Normal respiratory effort.  No retractions. Lungs CTAB and moving good air Gastrointestinal: Soft nontender Musculoskeletal: No lower extremity edema   Neurologic: Moves all 4 feels all 4.  Normal reflexes in all 4.  Confabulating Skin:  Skin is warm, dry and intact. No rash noted. Psychiatric: Encephalopathic   ____________________________________________   DIFFERENTIAL includes but not limited to  Stroke, intracerebral hemorrhage, metabolic arrangement, alcohol intoxication, therapeutic misadventure, encephalitis, alcohol withdrawal, Wernicke's encephalitis ____________________________________________   LABS (all labs ordered are listed, but only abnormal results are displayed)  Labs Reviewed  URINALYSIS, COMPLETE (UACMP) WITH MICROSCOPIC - Abnormal; Notable for the following components:      Result Value   Color, Urine YELLOW (*)    APPearance CLEAR (*)    All other components within normal limits  URINE DRUG SCREEN, QUALITATIVE ([facility] ONLY) - Abnormal; Notable for the following components:   Tricyclic, Ur Screen POSITIVE (*)    Benzodiazepine, Ur Scrn POSITIVE (*)    All other components within normal limits  COMPREHENSIVE METABOLIC PANEL - Abnormal; Notable for the following components:   Glucose, Bld 148 (*)    Creatinine, Ser 1.33 (*)    Alkaline  Phosphatase 134 (*)    GFR calc non Af Amer 40 (*)    GFR calc Af Amer 47 (*)    All other components within normal limits  ACETAMINOPHEN LEVEL - Abnormal; Notable for the following components:   Acetaminophen (Tylenol), Serum <10 (*)    All other components within normal limits  CBC WITH DIFFERENTIAL/PLATELET - Abnormal; Notable for the following components:   RDW 19.2 (*)    All other components within normal limits  CK - Abnormal; Notable for the following components:   Total CK 585 (*)    All other components within normal limits  ETHANOL  SALICYLATE LEVEL  AMMONIA  PROTIME-INR  BASIC METABOLIC PANEL  CBC    Lab work reviewed by me is positive for both tricyclics  and benzodiazepines.  Otherwise unremarkable __________________________________________  EKG  ED ECG REPORT I, Darel Hong, the attending physician, personally viewed and interpreted this ECG.  Date: 11/03/2017 EKG Time:  Rate: 97 Rhythm: normal sinus rhythm QRS Axis: normal Intervals: normal ST/T Wave abnormalities: normal Narrative Interpretation: no evidence of acute ischemia  ____________________________________________  RADIOLOGY  Head CT reviewed by me with no acute disease ____________________________________________   PROCEDURES  Procedure(s) performed: no  .Critical Care Performed by: Darel Hong, MD Authorized by: Darel Hong, MD   Critical care provider statement:    Critical care time (minutes):  35   Critical care time was exclusive of:  Separately billable procedures and treating other patients   Critical care was necessary to treat or prevent imminent or life-threatening deterioration of the following conditions:  Toxidrome and CNS failure or compromise   Critical care was time spent personally by me on the following activities:  Development of treatment plan with patient or surrogate, discussions with consultants, evaluation of patient's response to treatment,  examination of patient, obtaining history from patient or surrogate, ordering and performing treatments and interventions, ordering and review of laboratory studies, ordering and review of radiographic studies, pulse oximetry, re-evaluation of patient's condition and review of old charts    Critical Care performed: no  Observation: no ____________________________________________   INITIAL IMPRESSION / ASSESSMENT AND PLAN / ED COURSE  Pertinent labs & imaging results that were available during my care of the patient were reviewed by me and considered in my medical decision making (see chart for details).  The patient arrives tremulous, confused, somewhat agitated.  Differential is broad but includes alcohol intoxication, therapeutic misadventure with benzodiazepines, serotonin syndrome, intracerebral hemorrhage.  The patient is largely uncooperative with our exam and she will require intravenous haloperidol for her own safety to facilitate a medical work-up searching for an organic cause of her behavior.     The patient's behavior remains quite odd and she is confabulating.  She has erratic eye movements and is unsteady when attempting to get up.  According to family she has a long-standing history of chronic alcohol abuse and drinks every single day.  She does not appear to be withdrawing from alcohol and actually concerned that she might have Warnicke's encephalopathy.  At this point she requires inpatient admission for IV vitamin, magnesium, and further work-up and treatment of her altered mental status.  I had a lengthy discussion with her children who verbalized understanding and agreement with the plan.  As the patient appears to have a medical etiology of her symptoms I have lifted her involuntary commitment. ____________________________________________   FINAL CLINICAL IMPRESSION(S) / ED DIAGNOSES  Final diagnoses:  Encephalopathy      NEW MEDICATIONS STARTED DURING THIS  VISIT:  Current Discharge Medication List       Note:  This document was prepared using Dragon voice recognition software and may include unintentional dictation errors.     Darel Hong, MD 11/03/17 9675    Darel Hong, MD 11/09/17 [phone]

## 2017-11-02 NOTE — ED Triage Notes (Addendum)
Pt in from home by [facility] family called ems due to altered mental status and confusion. They told ems that she lives alone and they found pills all over the house, they are unsure of overdose. Pt alert to name and place but not answering all questions appropriately. Does state that she has had diarrhea, no vomiting. FSBS per ems 127, family stated positive etoh today.

## 2017-11-03 ENCOUNTER — Inpatient Hospital Stay: Payer: PPO

## 2017-11-03 DIAGNOSIS — Z79899 Other long term (current) drug therapy: Secondary | ICD-10-CM | POA: Diagnosis not present

## 2017-11-03 DIAGNOSIS — F13231 Sedative, hypnotic or anxiolytic dependence with withdrawal delirium: Secondary | ICD-10-CM | POA: Diagnosis not present

## 2017-11-03 DIAGNOSIS — F13239 Sedative, hypnotic or anxiolytic dependence with withdrawal, unspecified: Secondary | ICD-10-CM | POA: Diagnosis present

## 2017-11-03 DIAGNOSIS — E785 Hyperlipidemia, unspecified: Secondary | ICD-10-CM | POA: Diagnosis present

## 2017-11-03 DIAGNOSIS — F13939 Sedative, hypnotic or anxiolytic use, unspecified with withdrawal, unspecified: Secondary | ICD-10-CM

## 2017-11-03 DIAGNOSIS — F419 Anxiety disorder, unspecified: Secondary | ICD-10-CM | POA: Diagnosis present

## 2017-11-03 DIAGNOSIS — G92 Toxic encephalopathy: Secondary | ICD-10-CM | POA: Diagnosis present

## 2017-11-03 DIAGNOSIS — F17200 Nicotine dependence, unspecified, uncomplicated: Secondary | ICD-10-CM | POA: Diagnosis present

## 2017-11-03 DIAGNOSIS — F10239 Alcohol dependence with withdrawal, unspecified: Secondary | ICD-10-CM | POA: Diagnosis present

## 2017-11-03 DIAGNOSIS — N179 Acute kidney failure, unspecified: Secondary | ICD-10-CM | POA: Diagnosis present

## 2017-11-03 DIAGNOSIS — F329 Major depressive disorder, single episode, unspecified: Secondary | ICD-10-CM | POA: Diagnosis present

## 2017-11-03 DIAGNOSIS — M810 Age-related osteoporosis without current pathological fracture: Secondary | ICD-10-CM | POA: Diagnosis present

## 2017-11-03 DIAGNOSIS — G9340 Encephalopathy, unspecified: Secondary | ICD-10-CM

## 2017-11-03 DIAGNOSIS — I4581 Long QT syndrome: Secondary | ICD-10-CM | POA: Diagnosis not present

## 2017-11-03 DIAGNOSIS — R443 Hallucinations, unspecified: Secondary | ICD-10-CM | POA: Diagnosis present

## 2017-11-03 DIAGNOSIS — Z7982 Long term (current) use of aspirin: Secondary | ICD-10-CM | POA: Diagnosis not present

## 2017-11-03 DIAGNOSIS — I10 Essential (primary) hypertension: Secondary | ICD-10-CM | POA: Diagnosis present

## 2017-11-03 LAB — COMPREHENSIVE METABOLIC PANEL
ALT: 22 U/L (ref 14–54)
ANION GAP: 9 (ref 5–15)
AST: 30 U/L (ref 15–41)
Albumin: 3.9 g/dL (ref 3.5–5.0)
Alkaline Phosphatase: 134 U/L — ABNORMAL HIGH (ref 38–126)
BILIRUBIN TOTAL: 0.4 mg/dL (ref 0.3–1.2)
BUN: 17 mg/dL (ref 6–20)
CHLORIDE: 107 mmol/L (ref 101–111)
CO2: 25 mmol/L (ref 22–32)
Calcium: 8.9 mg/dL (ref 8.9–10.3)
Creatinine, Ser: 1.33 mg/dL — ABNORMAL HIGH (ref 0.44–1.00)
GFR calc non Af Amer: 40 mL/min — ABNORMAL LOW (ref >60)
GFR, EST AFRICAN AMERICAN: 47 mL/min — AB (ref >60)
Glucose, Bld: 148 mg/dL — ABNORMAL HIGH (ref 65–99)
POTASSIUM: 3.7 mmol/L (ref 3.5–5.1)
Sodium: 141 mmol/L (ref 135–145)
TOTAL PROTEIN: 7.1 g/dL (ref 6.5–8.1)

## 2017-11-03 LAB — CBC WITH DIFFERENTIAL/PLATELET
Basophils Absolute: 0 10*3/uL (ref 0–0.1)
Basophils Relative: 1 %
EOS ABS: 0 10*3/uL (ref 0–0.7)
Eosinophils Relative: 1 %
HCT: 36.6 % (ref 35.0–47.0)
HEMOGLOBIN: 12.5 g/dL (ref 12.0–16.0)
LYMPHS ABS: 1.2 10*3/uL (ref 1.0–3.6)
Lymphocytes Relative: 23 %
MCH: 32.4 pg (ref 26.0–34.0)
MCHC: 34.1 g/dL (ref 32.0–36.0)
MCV: 94.8 fL (ref 80.0–100.0)
Monocytes Absolute: 0.5 10*3/uL (ref 0.2–0.9)
Monocytes Relative: 9 %
NEUTROS PCT: 66 %
Neutro Abs: 3.6 10*3/uL (ref 1.4–6.5)
Platelets: 332 10*3/uL (ref 150–440)
RBC: 3.87 MIL/uL (ref 3.80–5.20)
RDW: 19.2 % — ABNORMAL HIGH (ref 11.5–14.5)
WBC: 5.3 10*3/uL (ref 3.6–11.0)

## 2017-11-03 LAB — BASIC METABOLIC PANEL
Anion gap: 10 (ref 5–15)
BUN: 21 mg/dL — AB (ref 6–20)
CALCIUM: 8.6 mg/dL — AB (ref 8.9–10.3)
CHLORIDE: 106 mmol/L (ref 101–111)
CO2: 23 mmol/L (ref 22–32)
CREATININE: 1.02 mg/dL — AB (ref 0.44–1.00)
GFR calc non Af Amer: 56 mL/min — ABNORMAL LOW (ref >60)
Glucose, Bld: 166 mg/dL — ABNORMAL HIGH (ref 65–99)
Potassium: 3.6 mmol/L (ref 3.5–5.1)
SODIUM: 139 mmol/L (ref 135–145)

## 2017-11-03 LAB — URINE DRUG SCREEN, QUALITATIVE (ARMC ONLY)
Amphetamines, Ur Screen: NOT DETECTED
BARBITURATES, UR SCREEN: NOT DETECTED
BENZODIAZEPINE, UR SCRN: POSITIVE — AB
CANNABINOID 50 NG, UR ~~LOC~~: NOT DETECTED
Cocaine Metabolite,Ur ~~LOC~~: NOT DETECTED
MDMA (Ecstasy)Ur Screen: NOT DETECTED
METHADONE SCREEN, URINE: NOT DETECTED
OPIATE, UR SCREEN: NOT DETECTED
PHENCYCLIDINE (PCP) UR S: NOT DETECTED
Tricyclic, Ur Screen: POSITIVE — AB

## 2017-11-03 LAB — URINALYSIS, COMPLETE (UACMP) WITH MICROSCOPIC
BILIRUBIN URINE: NEGATIVE
Bacteria, UA: NONE SEEN
Glucose, UA: NEGATIVE mg/dL
HGB URINE DIPSTICK: NEGATIVE
KETONES UR: NEGATIVE mg/dL
LEUKOCYTES UA: NEGATIVE
Nitrite: NEGATIVE
PH: 5 (ref 5.0–8.0)
Protein, ur: NEGATIVE mg/dL
Specific Gravity, Urine: 1.017 (ref 1.005–1.030)

## 2017-11-03 LAB — CBC
HCT: 35.4 % (ref 35.0–47.0)
Hemoglobin: 11.9 g/dL — ABNORMAL LOW (ref 12.0–16.0)
MCH: 32.1 pg (ref 26.0–34.0)
MCHC: 33.5 g/dL (ref 32.0–36.0)
MCV: 95.9 fL (ref 80.0–100.0)
PLATELETS: 341 10*3/uL (ref 150–440)
RBC: 3.69 MIL/uL — AB (ref 3.80–5.20)
RDW: 19.9 % — AB (ref 11.5–14.5)
WBC: 5 10*3/uL (ref 3.6–11.0)

## 2017-11-03 LAB — ACETAMINOPHEN LEVEL: Acetaminophen (Tylenol), Serum: <10 ug/mL — ABNORMAL LOW (ref 10–30)

## 2017-11-03 LAB — AMMONIA: AMMONIA: 31 umol/L (ref 9–35)

## 2017-11-03 LAB — PROTIME-INR
INR: 0.93
PROTHROMBIN TIME: 12.4 s (ref 11.4–15.2)

## 2017-11-03 LAB — CK: CK TOTAL: 585 U/L — AB (ref 38–234)

## 2017-11-03 LAB — ETHANOL

## 2017-11-03 LAB — GLUCOSE, CAPILLARY: GLUCOSE-CAPILLARY: 138 mg/dL — AB (ref 65–99)

## 2017-11-03 LAB — SALICYLATE LEVEL

## 2017-11-03 LAB — MAGNESIUM: Magnesium: 2.5 mg/dL — ABNORMAL HIGH (ref 1.7–2.4)

## 2017-11-03 MED ORDER — SIMVASTATIN 20 MG PO TABS
40.0000 mg | ORAL_TABLET | Freq: Every day | ORAL | Status: DC
  Administered 2017-11-03: 18:00:00 40 mg via ORAL
  Filled 2017-11-03: qty 2

## 2017-11-03 MED ORDER — SODIUM CHLORIDE 0.9 % IV SOLN
INTRAVENOUS | Status: DC
  Administered 2017-11-03 – 2017-11-04 (×3): via INTRAVENOUS

## 2017-11-03 MED ORDER — TRAZODONE HCL 50 MG PO TABS
25.0000 mg | ORAL_TABLET | Freq: Every evening | ORAL | Status: DC | PRN
Start: 2017-11-03 — End: 2017-11-04

## 2017-11-03 MED ORDER — LORAZEPAM 2 MG/ML IJ SOLN: 0.0000 mg | Freq: Two times a day (BID) | INTRAMUSCULAR | Status: DC

## 2017-11-03 MED ORDER — BUSPIRONE HCL 5 MG PO TABS
10.0000 mg | ORAL_TABLET | Freq: Two times a day (BID) | ORAL | Status: DC
  Administered 2017-11-03 (×2): 10 mg via ORAL
  Filled 2017-11-03: qty 1
  Filled 2017-11-03 (×3): qty 2
  Filled 2017-11-03: qty 1
  Filled 2017-11-03: qty 2

## 2017-11-03 MED ORDER — ALPRAZOLAM 1 MG PO TABS
1.0000 mg | ORAL_TABLET | Freq: Four times a day (QID) | ORAL | Status: DC
  Administered 2017-11-03 – 2017-11-04 (×4): 1 mg via ORAL
  Filled 2017-11-03 (×4): qty 1

## 2017-11-03 MED ORDER — METOPROLOL SUCCINATE ER 50 MG PO TB24
50.0000 mg | ORAL_TABLET | Freq: Every day | ORAL | Status: DC
  Administered 2017-11-03 – 2017-11-04 (×2): 50 mg via ORAL
  Filled 2017-11-03 (×2): qty 1

## 2017-11-03 MED ORDER — MAGNESIUM SULFATE 2 GM/50ML IV SOLN
2.0000 g | Freq: Once | INTRAVENOUS | Status: AC
  Administered 2017-11-03: 2 g via INTRAVENOUS
  Filled 2017-11-03: qty 50

## 2017-11-03 MED ORDER — CITALOPRAM HYDROBROMIDE 20 MG PO TABS
20.0000 mg | ORAL_TABLET | Freq: Every day | ORAL | Status: DC
  Administered 2017-11-03 – 2017-11-04 (×2): 20 mg via ORAL
  Filled 2017-11-03 (×2): qty 1

## 2017-11-03 MED ORDER — THIAMINE HCL 100 MG/ML IJ SOLN
500.0000 mg | Freq: Once | INTRAMUSCULAR | Status: AC
  Administered 2017-11-03: 500 mg via INTRAVENOUS

## 2017-11-03 MED ORDER — ASPIRIN EC 325 MG PO TBEC
325.0000 mg | DELAYED_RELEASE_TABLET | Freq: Every day | ORAL | Status: DC
  Administered 2017-11-03 – 2017-11-04 (×2): 325 mg via ORAL
  Filled 2017-11-03 (×2): qty 1

## 2017-11-03 MED ORDER — DOCUSATE SODIUM 100 MG PO CAPS
100.0000 mg | ORAL_CAPSULE | Freq: Two times a day (BID) | ORAL | Status: DC
  Administered 2017-11-03 (×2): 100 mg via ORAL
  Filled 2017-11-03 (×3): qty 1

## 2017-11-03 MED ORDER — IBANDRONATE SODIUM 150 MG PO TABS: 150.0000 mg | ORAL_TABLET | ORAL | Status: DC

## 2017-11-03 MED ORDER — HYDROCODONE-ACETAMINOPHEN 5-325 MG PO TABS: 1.0000 | ORAL_TABLET | ORAL | Status: DC | PRN

## 2017-11-03 MED ORDER — LORAZEPAM 1 MG PO TABS: 1.0000 mg | ORAL_TABLET | Freq: Four times a day (QID) | ORAL | Status: DC | PRN

## 2017-11-03 MED ORDER — ACETAMINOPHEN 650 MG RE SUPP: 650.0000 mg | Freq: Four times a day (QID) | RECTAL | Status: DC | PRN

## 2017-11-03 MED ORDER — ONDANSETRON HCL 4 MG PO TABS: 4.0000 mg | ORAL_TABLET | Freq: Four times a day (QID) | ORAL | Status: DC | PRN

## 2017-11-03 MED ORDER — DICYCLOMINE HCL 10 MG PO CAPS
10.0000 mg | ORAL_CAPSULE | Freq: Four times a day (QID) | ORAL | Status: DC | PRN
  Filled 2017-11-03: qty 1

## 2017-11-03 MED ORDER — LORAZEPAM 2 MG/ML IJ SOLN: 1.0000 mg | Freq: Four times a day (QID) | INTRAMUSCULAR | Status: DC | PRN

## 2017-11-03 MED ORDER — LORAZEPAM 2 MG/ML IJ SOLN
0.0000 mg | Freq: Four times a day (QID) | INTRAMUSCULAR | Status: DC
  Administered 2017-11-03: 2 mg via INTRAVENOUS
  Filled 2017-11-03: qty 1

## 2017-11-03 MED ORDER — BISACODYL 5 MG PO TBEC: 5.0000 mg | DELAYED_RELEASE_TABLET | Freq: Every day | ORAL | Status: DC | PRN

## 2017-11-03 MED ORDER — ZOLPIDEM TARTRATE 5 MG PO TABS
5.0000 mg | ORAL_TABLET | Freq: Every day | ORAL | Status: DC
  Administered 2017-11-03: 5 mg via ORAL
  Filled 2017-11-03: qty 1

## 2017-11-03 MED ORDER — HEPARIN SODIUM (PORCINE) 5000 UNIT/ML IJ SOLN
5000.0000 [IU] | Freq: Three times a day (TID) | INTRAMUSCULAR | Status: DC
  Administered 2017-11-03 – 2017-11-04 (×4): 5000 [IU] via SUBCUTANEOUS
  Filled 2017-11-03 (×3): qty 1

## 2017-11-03 MED ORDER — VITAMIN B-12 100 MCG PO TABS
100.0000 ug | ORAL_TABLET | Freq: Every day | ORAL | Status: DC
  Administered 2017-11-03 – 2017-11-04 (×2): 100 ug via ORAL
  Filled 2017-11-03 (×2): qty 1

## 2017-11-03 MED ORDER — THIAMINE HCL 100 MG/ML IJ SOLN
100.0000 mg | Freq: Every day | INTRAMUSCULAR | Status: DC
  Administered 2017-11-03: 100 mg via INTRAVENOUS
  Filled 2017-11-03 (×2): qty 1

## 2017-11-03 MED ORDER — ACETAMINOPHEN 325 MG PO TABS: 650.0000 mg | ORAL_TABLET | Freq: Four times a day (QID) | ORAL | Status: DC | PRN

## 2017-11-03 MED ORDER — ADULT MULTIVITAMIN W/MINERALS CH
1.0000 | ORAL_TABLET | Freq: Every day | ORAL | Status: DC
  Administered 2017-11-03: 1 via ORAL
  Filled 2017-11-03 (×2): qty 1

## 2017-11-03 MED ORDER — ONDANSETRON HCL 4 MG/2ML IJ SOLN: 4.0000 mg | Freq: Four times a day (QID) | INTRAMUSCULAR | Status: DC | PRN

## 2017-11-03 MED ORDER — FOLIC ACID 1 MG PO TABS
1.0000 mg | ORAL_TABLET | Freq: Every day | ORAL | Status: DC
  Administered 2017-11-03 – 2017-11-04 (×2): 1 mg via ORAL
  Filled 2017-11-03 (×2): qty 1

## 2017-11-03 MED ORDER — VITAMIN B-1 100 MG PO TABS
100.0000 mg | ORAL_TABLET | Freq: Every day | ORAL | Status: DC
  Administered 2017-11-04: 09:00:00 100 mg via ORAL
  Filled 2017-11-03 (×2): qty 1

## 2017-11-03 NOTE — ED Notes (Addendum)
Report called to [employer]. Pt AC we have to hold patient till sitter available due to IVC status. IVC released per Dr. Mable [place].

## 2017-11-03 NOTE — Plan of Care (Signed)

## 2017-11-03 NOTE — Progress Notes (Signed)
[place] at [place] NAME: Wendy Arias    MR#:  [id number]  DATE OF BIRTH:  11-20-1950  SUBJECTIVE:   Pt. Here due to altered mental status, confusion. Pt. Confabulating and hallucinating at times.  Patient also having twitching and abnormal movements at times.  Patient's daughter is at bedside.  REVIEW OF SYSTEMS:    Review of Systems  Unable to perform ROS: Mental acuity    Nutrition: Heart heatlhy Tolerating Diet: Yes Tolerating PT: Await Eval.    DRUG ALLERGIES:   Allergies  Allergen Reactions  . Penicillins     Has patient had a PCN reaction causing immediate rash, facial/tongue/throat swelling, SOB or lightheadedness with hypotension: Yes Has patient had a PCN reaction causing severe rash involving mucus membranes or skin necrosis: Unknown Has patient had a PCN reaction that required hospitalization: Unknown Has patient had a PCN reaction occurring within the last 10 years: Unknown If all of the above answers are "NO", then may proceed with Cephalosporin use.    VITALS:  Blood pressure 118/90, pulse 81, temperature 98.9 F (37.2 C), temperature source Axillary, resp. rate 18, height 5\' 2"  (1.575 m), weight 49.9 kg (110 lb), SpO2 95 %.  PHYSICAL EXAMINATION:   Physical Exam  GENERAL:  67 y.o.-year-old patient lying in bed confused and hallucinating but in NAD.   EYES: Pupils equal, round, reactive to light and accommodation. No scleral icterus. Extraocular muscles intact.  HEENT: Head atraumatic, normocephalic. Oropharynx and nasopharynx clear.  NECK:  Supple, no jugular venous distention. No thyroid enlargement, no tenderness.  LUNGS: Normal breath sounds bilaterally, no wheezing, rales, rhonchi. No use of accessory muscles of respiration.  CARDIOVASCULAR: S1, S2 normal. No murmurs, rubs, or gallops.  ABDOMEN: Soft, nontender, nondistended. Bowel sounds present. No organomegaly or mass.  EXTREMITIES: No cyanosis, clubbing  or edema b/l.    NEUROLOGIC: Moves all Ext. Spontaneously. Twitching, Tremor at times.  Confused, Hallucinating and confused.  PSYCHIATRIC: The patient is alert and oriented x 1.   SKIN: No obvious rash, lesion, or ulcer.    LABORATORY PANEL:   CBC Recent Labs  Lab 11/03/17 0430  WBC 5.0  HGB 11.9*  HCT 35.4  PLT 341   ------------------------------------------------------------------------------------------------------------------  Chemistries  Recent Labs  Lab 11/02/17 2343 11/03/17 0430  NA 141 139  K 3.7 3.6  CL 107 106  CO2 25 23  GLUCOSE 148* 166*  BUN 17 21*  CREATININE 1.33* 1.02*  CALCIUM 8.9 8.6*  MG  --  2.5*  AST 30  --   ALT 22  --   ALKPHOS 134*  --   BILITOT 0.4  --    ------------------------------------------------------------------------------------------------------------------  Cardiac Enzymes No results for input(s): TROPONINI in the last 168 hours. ------------------------------------------------------------------------------------------------------------------  RADIOLOGY:  Ct Head Wo Contrast  Result Date: 11/03/2017 CLINICAL DATA:  67 year old female with altered mental status. EXAM: CT HEAD WITHOUT CONTRAST TECHNIQUE: Contiguous axial images were obtained from the base of the skull through the vertex without intravenous contrast. COMPARISON:  Head CT dated 08/25/2011 FINDINGS: Brain: No evidence of acute infarction, hemorrhage, hydrocephalus, extra-axial collection or mass lesion/mass effect. Vascular: No hyperdense vessel or unexpected calcification. Skull: Normal. Negative for fracture or focal lesion. Sinuses/Orbits: No acute finding. Other: None. IMPRESSION: Unremarkable noncontrast CT of the brain. Electronically Signed   By: Anner Crete M.D.   On: 11/03/2017 00:10   Mr Brain Wo Contrast  Result Date: 11/03/2017 CLINICAL DATA:  Altered level of consciousness EXAM:  MRI HEAD WITHOUT CONTRAST TECHNIQUE: Multiplanar, multiecho pulse  sequences of the brain and surrounding structures were obtained without intravenous contrast. COMPARISON:  Head CT from yesterday FINDINGS: Brain: No acute infarction, hemorrhage, hydrocephalus, extra-axial collection or mass lesion. Vascular: Major flow voids are grossly preserved Skull and upper cervical spine: No evidence of marrow lesion. Sinuses/Orbits: Negative IMPRESSION: Motion degraded to a degree that findings could easily be obscured. No abnormality detected. Electronically Signed   By: Monte Fantasia M.D.   On: 11/03/2017 12:16     ASSESSMENT AND PLAN:   67 year old female with past medical history of osteoporosis, hypertension, anxiety, hyperlipidemia, irritable bowel syndrome, who presents to the hospital due to her altered mental status/confusion.  1.  Altered mental status/confusion- etiology unclear but suspected to be secondary to polypharmacy/alcohol withdrawal/medication withdrawal. - Patient having significant hallucinations twitching. - Neurologic work-up so far has been negative including CT head, MRI of the brain.  Appreciate neurology consult and will get EEG. -Also get psychiatric consult to help with polypharmacy/med management.  2.  Alcohol abuse- patient actually going through alcohol withdrawal.  Continue CIWA protocol. -Continue thiamine, folate.  3.  Anxiety- continue Xanax, BuSpar.  Await further psychiatric input.  4.  Depression-continue Celexa.  5.  Hyperlipidemia-continue Zocor.  6. Essential HTN - cont. Toprol.    All the records are reviewed and case discussed with Care Management/Social Worker. Management plans discussed with the patient, family and they are in agreement.  CODE STATUS: Full code  DVT Prophylaxis: Hep. SQ  TOTAL TIME TAKING CARE OF THIS PATIENT: 30 minutes.   POSSIBLE D/C IN 2-3 DAYS, DEPENDING ON CLINICAL CONDITION.  Updated family on plan on care over the phone and at bedside.   Henreitta Leber M.D on 11/03/2017 at 4:28  PM  Between 7am to 6pm - Pager - [phone]  After 6pm go to [email or web address] - Proofreader  [facility] [facility]  Office  [phone]  CC: Primary care physician; Elgie Collard, MD

## 2017-11-03 NOTE — Consult Note (Addendum)
[facility] Consult   Reason for Consult: Consult for 67 year old woman brought to the hospital after being found confused by her family Referring Physician: Verdell Carmine Patient Identification: MIGNON BECHLER MRN:  [id number] Principal Diagnosis: Benzodiazepine withdrawal [facility]) Diagnosis:   Patient Active Problem List   Diagnosis Date Noted  . Acute encephalopathy [G93.40] 11/03/2017  . Benzodiazepine withdrawal ([place]) [F13.239] 11/03/2017    Total Time spent with patient: 1 hour  Subjective:   TERREE GAULTNEY is a 67 y.o. female patient admitted with "I been sick for a while".  HPI: Patient interviewed chart reviewed.  This is a 67 year old woman brought to the emergency room by her family with a report that she had seemed confused for several hours.  Patient was not able to give much of any history in the emergency room.  Appears to be delirious but with unclear etiology.  On interview today the patient was disoriented not knowing where she was right now.  Did not know the correct town or year either.  Had no idea how or why she came to be in the hospital.  Patient tried to tell me the names of her medicines but slurred her words and frequently inserted other bizarre words in her speech.  Denied being depressed but said her mood "could be better".  She denies suicidal ideation.  Denies any psychosis.  Patient tells me that she drinks about a half a bottle of wine every day but has never thought that it was a problem.  Patient is on 4 mg/day total of alprazolam and has been getting that for quite a while and also takes 10 mg of Ambien at night which adds up to a pretty good load of medication.  Unclear if she had run out of it early.  Social history: Evidently lives alone  Medical history: Patient has a history of having had a colonoscopy.  Has high blood pressure.  Substance abuse history: Patient denies thinking alcohol was ever a problem for her.  Nothing in the old chart clearly  indicates past substance abuse was considered a problem  Past Psychiatric History: Patient is seen by an outpatient provider for depression or at least for anxiety.  Is on high dose Xanax and has been chronically.  No history of past hospitalization no history of suicide attempts.  Risk to Self: Is patient at risk for suicide?: No Risk to Others:   Prior Inpatient Therapy:   Prior Outpatient Therapy:    Past Medical History:  Past Medical History:  Diagnosis Date  . Hypertension   . Osteoporosis     Past Surgical History:  Procedure Laterality Date  . ABDOMINAL HYSTERECTOMY    . CARDIAC SURGERY    . COLONOSCOPY WITH PROPOFOL N/A 04/12/2017   Procedure: COLONOSCOPY WITH PROPOFOL;  Surgeon: Lollie Sails, MD;  Location: [facility] ENDOSCOPY;  Service: Endoscopy;  Laterality: N/A;  . REDUCTION MAMMAPLASTY     Family History:  Family History  Problem Relation Age of Onset  . Breast cancer Mother 66  . Breast cancer Maternal Aunt 70  . Breast cancer Other 46   Family Psychiatric  History: None known Social History:  Social History   Substance and Sexual Activity  Alcohol Use Yes   Comment: rarely     Social History   Substance and Sexual Activity  Drug Use Not on file    Social History   Socioeconomic History  . Marital status: Divorced    Spouse name: Not on file  .  Number of children: Not on file  . Years of education: Not on file  . Highest education level: Not on file  Occupational History  . Not on file  Social Needs  . Financial resource strain: Not on file  . Food insecurity:    Worry: Not on file    Inability: Not on file  . Transportation needs:    Medical: Not on file    Non-medical: Not on file  Tobacco Use  . Smoking status: Current Some Day Smoker  . Smokeless tobacco: Never Used  Substance and Sexual Activity  . Alcohol use: Yes    Comment: rarely  . Drug use: Not on file  . Sexual activity: Not on file  Lifestyle  . Physical activity:     Days per week: Not on file    Minutes per session: Not on file  . Stress: Not on file  Relationships  . Social connections:    Talks on phone: Not on file    Gets together: Not on file    Attends religious service: Not on file    Active member of club or organization: Not on file    Attends meetings of clubs or organizations: Not on file    Relationship status: Not on file  Other Topics Concern  . Not on file  Social History Narrative  . Not on file   Additional Social History:    Allergies:   Allergies  Allergen Reactions  . Penicillins     Has patient had a PCN reaction causing immediate rash, facial/tongue/throat swelling, SOB or lightheadedness with hypotension: Yes Has patient had a PCN reaction causing severe rash involving mucus membranes or skin necrosis: Unknown Has patient had a PCN reaction that required hospitalization: Unknown Has patient had a PCN reaction occurring within the last 10 years: Unknown If all of the above answers are "NO", then may proceed with Cephalosporin use.    Labs:  Results for orders placed or performed during the hospital encounter of 11/02/17 (from the past 48 hour(s))  Comprehensive metabolic panel     Status: Abnormal   Collection Time: 11/02/17 11:43 PM  Result Value Ref Range   Sodium 141 135 - 145 mmol/L   Potassium 3.7 3.5 - 5.1 mmol/L   Chloride 107 101 - 111 mmol/L   CO2 25 22 - 32 mmol/L   Glucose, Bld 148 (H) 65 - 99 mg/dL   BUN 17 6 - 20 mg/dL   Creatinine, Ser 1.33 (H) 0.44 - 1.00 mg/dL   Calcium 8.9 8.9 - 10.3 mg/dL   Total Protein 7.1 6.5 - 8.1 g/dL   Albumin 3.9 3.5 - 5.0 g/dL   AST 30 15 - 41 U/L   ALT 22 14 - 54 U/L   Alkaline Phosphatase 134 (H) 38 - 126 U/L   Total Bilirubin 0.4 0.3 - 1.2 mg/dL   GFR calc non Af Amer 40 (L) >60 mL/min   GFR calc Af Amer 47 (L) >60 mL/min    Comment: (NOTE) The eGFR has been calculated using the CKD EPI equation. This calculation has not been validated in all clinical  situations. eGFR's persistently <60 mL/min signify possible Chronic Kidney Disease.    Anion gap 9 5 - 15    Comment: Performed at [facility], [place], [place] [address]  Acetaminophen level     Status: Abnormal   Collection Time: 11/02/17 11:43 PM  Result Value Ref Range   Acetaminophen (Tylenol), Serum <10 (  L) 10 - 30 ug/mL    Comment:        THERAPEUTIC CONCENTRATIONS VARY SIGNIFICANTLY. A RANGE OF 10-30 ug/mL MAY BE AN EFFECTIVE CONCENTRATION FOR MANY PATIENTS. HOWEVER, SOME ARE BEST TREATED AT CONCENTRATIONS OUTSIDE THIS RANGE. ACETAMINOPHEN CONCENTRATIONS >150 ug/mL AT 4 HOURS AFTER INGESTION AND >50 ug/mL AT 12 HOURS AFTER INGESTION ARE OFTEN ASSOCIATED WITH TOXIC REACTIONS. Performed at [facility], [address] [place], [place] [address]   Ethanol     Status: None   Collection Time: 11/02/17 11:43 PM  Result Value Ref Range   Alcohol, Ethyl (B) <10 <10 mg/dL    Comment:        LOWEST DETECTABLE LIMIT FOR SERUM ALCOHOL IS 10 mg/dL FOR MEDICAL PURPOSES ONLY Performed at [facility], [address] [place], [place] [address]   Salicylate level     Status: None   Collection Time: 11/02/17 11:43 PM  Result Value Ref Range   Salicylate Lvl <0.2 2.8 - 30.0 mg/dL    Comment: Performed at [facility], [address] [place], [place] [address]  CBC with Differential     Status: Abnormal   Collection Time: 11/02/17 11:43 PM  Result Value Ref Range   WBC 5.3 3.6 - 11.0 K/uL   RBC 3.87 3.80 - 5.20 MIL/uL   Hemoglobin 12.5 12.0 - 16.0 g/dL   HCT 36.6 35.0 - 47.0 %   MCV 94.8 80.0 - 100.0 fL   MCH 32.4 26.0 - 34.0 pg   MCHC 34.1 32.0 - 36.0 g/dL   RDW 19.2 (H) 11.5 - 14.5 %   Platelets 332 150 - 440 K/uL   Neutrophils Relative % 66 %   Neutro Abs 3.6 1.4 - 6.5 K/uL   Lymphocytes Relative 23 %   Lymphs Abs 1.2 1.0 - 3.6 K/uL   Monocytes Relative 9 %   Monocytes Absolute 0.5 0.2 - 0.9 K/uL    Eosinophils Relative 1 %   Eosinophils Absolute 0.0 0 - 0.7 K/uL   Basophils Relative 1 %   Basophils Absolute 0.0 0 - 0.1 K/uL    Comment: Performed at [facility], [address] [place], [place] [address]  CK     Status: Abnormal   Collection Time: 11/02/17 11:43 PM  Result Value Ref Range   Total CK 585 (H) 38 - 234 U/L    Comment: Performed at [facility], [address] [place], [place] [address]  Ammonia     Status: None   Collection Time: 11/02/17 11:43 PM  Result Value Ref Range   Ammonia 31 9 - 35 umol/L    Comment: Performed at [facility], [address] [place], [place] [address]  Protime-INR     Status: None   Collection Time: 11/02/17 11:43 PM  Result Value Ref Range   Prothrombin Time 12.4 11.4 - 15.2 seconds   INR 0.93     Comment: Performed at [facility], [address] [place], [place] [address]  Urinalysis, Complete w Microscopic     Status: Abnormal   Collection Time: 11/03/17  2:28 AM  Result Value Ref Range   Color, Urine YELLOW (A) YELLOW   APPearance CLEAR (A) CLEAR   Specific Gravity, Urine 1.017 1.005 - 1.030   pH 5.0 5.0 - 8.0   Glucose, UA NEGATIVE NEGATIVE mg/dL   Hgb urine dipstick NEGATIVE NEGATIVE   Bilirubin Urine NEGATIVE NEGATIVE   Ketones, ur NEGATIVE NEGATIVE mg/dL  Protein, ur NEGATIVE NEGATIVE mg/dL   Nitrite NEGATIVE NEGATIVE   Leukocytes, UA NEGATIVE NEGATIVE   RBC / HPF 0-5 0 - 5 RBC/hpf   WBC, UA 0-5 0 - 5 WBC/hpf   Bacteria, UA NONE SEEN NONE SEEN   Squamous Epithelial / LPF 0-5 0 - 5    Comment: Please note change in reference range.   Mucus PRESENT    Hyaline Casts, UA PRESENT     Comment: Performed at [facility], [address] [place], [place] [address]  Urine Drug Screen, Qualitative     Status: Abnormal   Collection Time: 11/03/17  2:28 AM  Result Value Ref Range   Tricyclic, Ur Screen POSITIVE (A) NONE DETECTED   Amphetamines, Ur Screen NONE DETECTED NONE  DETECTED   MDMA (Ecstasy)Ur Screen NONE DETECTED NONE DETECTED   Cocaine Metabolite,Ur [place] NONE DETECTED NONE DETECTED   Opiate, Ur Screen NONE DETECTED NONE DETECTED   Phencyclidine (PCP) Ur S NONE DETECTED NONE DETECTED   Cannabinoid 50 Ng, Ur [place] NONE DETECTED NONE DETECTED   Barbiturates, Ur Screen NONE DETECTED NONE DETECTED   Benzodiazepine, Ur Scrn POSITIVE (A) NONE DETECTED   Methadone Scn, Ur NONE DETECTED NONE DETECTED    Comment: (NOTE) Tricyclics + metabolites, urine    Cutoff 1000 ng/mL Amphetamines + metabolites, urine  Cutoff 1000 ng/mL MDMA (Ecstasy), urine              Cutoff 500 ng/mL Cocaine Metabolite, urine          Cutoff 300 ng/mL Opiate + metabolites, urine        Cutoff 300 ng/mL Phencyclidine (PCP), urine         Cutoff 25 ng/mL Cannabinoid, urine                 Cutoff 50 ng/mL Barbiturates + metabolites, urine  Cutoff 200 ng/mL Benzodiazepine, urine              Cutoff 200 ng/mL Methadone, urine                   Cutoff 300 ng/mL The urine drug screen provides only a preliminary, unconfirmed analytical test result and should not be used for non-medical purposes. Clinical consideration and professional judgment should be applied to any positive drug screen result due to possible interfering substances. A more specific alternate chemical method must be used in order to obtain a confirmed analytical result. Gas chromatography / mass spectrometry (GC/MS) is the preferred confirmat ory method. Performed at [facility], [address] [place], [place] [address]   Basic metabolic panel     Status: Abnormal   Collection Time: 11/03/17  4:30 AM  Result Value Ref Range   Sodium 139 135 - 145 mmol/L   Potassium 3.6 3.5 - 5.1 mmol/L   Chloride 106 101 - 111 mmol/L   CO2 23 22 - 32 mmol/L   Glucose, Bld 166 (H) 65 - 99 mg/dL   BUN 21 (H) 6 - 20 mg/dL   Creatinine, Ser 1.02 (H) 0.44 - 1.00 mg/dL   Calcium 8.6 (L) 8.9 - 10.3 mg/dL   GFR calc non Af  Amer 56 (L) >60 mL/min   GFR calc Af Amer >60 >60 mL/min    Comment: (NOTE) The eGFR has been calculated using the CKD EPI equation. This calculation has not been validated in all clinical situations. eGFR's persistently <60 mL/min signify possible Chronic Kidney Disease.    Anion gap 10 5 -  15    Comment: Performed at [facility], [address] [place], [place] [address]  CBC     Status: Abnormal   Collection Time: 11/03/17  4:30 AM  Result Value Ref Range   WBC 5.0 3.6 - 11.0 K/uL   RBC 3.69 (L) 3.80 - 5.20 MIL/uL   Hemoglobin 11.9 (L) 12.0 - 16.0 g/dL   HCT 35.4 35.0 - 47.0 %   MCV 95.9 80.0 - 100.0 fL   MCH 32.1 26.0 - 34.0 pg   MCHC 33.5 32.0 - 36.0 g/dL   RDW 19.9 (H) 11.5 - 14.5 %   Platelets 341 150 - 440 K/uL    Comment: Performed at [facility], [address], [place], [place] [address]  Magnesium     Status: Abnormal   Collection Time: 11/03/17  4:30 AM  Result Value Ref Range   Magnesium 2.5 (H) 1.7 - 2.4 mg/dL    Comment: Performed at [facility], [address] [place], [place] [address]  Glucose, capillary     Status: Abnormal   Collection Time: 11/03/17  7:53 AM  Result Value Ref Range   Glucose-Capillary 138 (H) 65 - 99 mg/dL    Current Facility-Administered Medications  Medication Dose Route Frequency Provider Last Rate Last Dose  . 0.9 %  sodium chloride infusion   Intravenous Continuous Amelia Jo, MD   Stopped at 11/03/17 1114  . acetaminophen (TYLENOL) tablet 650 mg  650 mg Oral Q6H PRN Amelia Jo, MD       Or  . acetaminophen (TYLENOL) suppository 650 mg  650 mg Rectal Q6H PRN Amelia Jo, MD      . ALPRAZolam Duanne Moron) tablet 1 mg  1 mg Oral QID Amelia Jo, MD   1 mg at 11/03/17 1815  . aspirin EC tablet 325 mg  325 mg Oral Daily Amelia Jo, MD   325 mg at 11/03/17 1016  . bisacodyl (DULCOLAX) EC tablet 5 mg  5 mg Oral Daily PRN Amelia Jo, MD      . busPIRone (BUSPAR) tablet 10 mg  10 mg Oral BID  Amelia Jo, MD   10 mg at 11/03/17 1315  . citalopram (CELEXA) tablet 20 mg  20 mg Oral Daily Amelia Jo, MD   20 mg at 11/03/17 1016  . dicyclomine (BENTYL) capsule 10 mg  10 mg Oral Q6H PRN Amelia Jo, MD      . docusate sodium (COLACE) capsule 100 mg  100 mg Oral BID Amelia Jo, MD   100 mg at 11/03/17 1016  . folic acid (FOLVITE) tablet 1 mg  1 mg Oral Daily Amelia Jo, MD   1 mg at 11/03/17 1016  . heparin injection 5,000 Units  5,000 Units Subcutaneous Q8H Amelia Jo, MD   5,000 Units at 11/03/17 1517  . HYDROcodone-acetaminophen (NORCO/VICODIN) 5-325 MG per tablet 1-2 tablet  1-2 tablet Oral Q4H PRN Amelia Jo, MD      . LORazepam (ATIVAN) injection 0-4 mg  0-4 mg Intravenous Q6H Amelia Jo, MD   2 mg at 11/03/17 1015   Followed by  . [START ON 11/05/2017] LORazepam (ATIVAN) injection 0-4 mg  0-4 mg Intravenous Q12H Amelia Jo, MD      . LORazepam (ATIVAN) tablet 1 mg  1 mg Oral Q6H PRN Amelia Jo, MD       Or  . LORazepam (ATIVAN) injection 1 mg  1 mg Intravenous Q6H PRN Amelia Jo, MD      . metoprolol  succinate (TOPROL-XL) 24 hr tablet 50 mg  50 mg Oral Daily Amelia Jo, MD   50 mg at 11/03/17 1016  . multivitamin with minerals tablet 1 tablet  1 tablet Oral Daily Amelia Jo, MD   1 tablet at 11/03/17 1016  . ondansetron (ZOFRAN) tablet 4 mg  4 mg Oral Q6H PRN Amelia Jo, MD       Or  . ondansetron [facility]) injection 4 mg  4 mg Intravenous Q6H PRN Amelia Jo, MD      . simvastatin (ZOCOR) tablet 40 mg  40 mg Oral Daily Amelia Jo, MD   40 mg at 11/03/17 1815  . thiamine (VITAMIN B-1) tablet 100 mg  100 mg Oral Daily Amelia Jo, MD       Or  . thiamine (B-1) injection 100 mg  100 mg Intravenous Daily Amelia Jo, MD   100 mg at 11/03/17 1015  . traZODone (DESYREL) tablet 25 mg  25 mg Oral QHS PRN Amelia Jo, MD      . vitamin B-12 (CYANOCOBALAMIN) tablet 100 mcg  100 mcg Oral Daily Amelia Jo, MD   100 mcg at 11/03/17 1018  .  zolpidem (AMBIEN) tablet 5 mg  5 mg Oral QHS Amelia Jo, MD        Musculoskeletal: Strength & Muscle Tone: decreased Gait & Station: unable to stand Patient leans: N/A  Psychiatric Specialty Exam: Physical Exam  Nursing note and vitals reviewed. Constitutional: She appears well-developed and well-nourished.  HENT:  Head: Normocephalic and atraumatic.  Eyes: Pupils are equal, round, and reactive to light. Conjunctivae are normal.  Neck: Normal range of motion.  Cardiovascular: Regular rhythm and normal heart sounds.  Respiratory: Effort normal. No respiratory distress.  GI: Soft.  Musculoskeletal: Normal range of motion.  Neurological: She is alert.  Skin: Skin is warm and dry.  Psychiatric: Her affect is blunt and inappropriate. Her speech is tangential and slurred. She is not agitated. Thought content is not paranoid. Cognition and memory are impaired. She expresses impulsivity and inappropriate judgment. She expresses no homicidal and no suicidal ideation. She is noncommunicative. She exhibits abnormal recent memory and abnormal remote memory.    Review of Systems  Constitutional: Negative.   HENT: Negative.   Eyes: Negative.   Respiratory: Negative.   Cardiovascular: Negative.   Gastrointestinal: Negative.   Musculoskeletal: Negative.   Skin: Negative.   Neurological: Negative.   Psychiatric/Behavioral: Negative for depression, hallucinations, substance abuse and suicidal ideas.    Blood pressure 118/90, pulse 81, temperature 98.9 F (37.2 C), temperature source Axillary, resp. rate 18, height '5\' 2"'$  (1.575 m), weight 49.9 kg (110 lb), SpO2 95 %.Body mass index is 20.12 kg/m.  General Appearance: Disheveled  Eye Contact:  Minimal  Speech:  Garbled, Slow and Slurred  Volume:  Decreased  Mood:  Dysphoric  Affect:  Blunt  Thought Process:  Disorganized  Orientation:  Negative  Thought Content:  Illogical  Suicidal Thoughts:  No  Homicidal Thoughts:  No  Memory:   Immediate;   Fair Recent;   Poor Remote;   Fair  Judgement:  Impaired  Insight:  Lacking  Psychomotor Activity:  Decreased  Concentration:  Concentration: Fair  Recall:  [employer] of Knowledge:  Fair  Language:  Fair  Akathisia:  No  Handed:  Right  AIMS (if indicated):     Assets:  Desire for Improvement Housing Social Support  ADL's:  Impaired  Cognition:  Impaired,  Moderate  Sleep:  Treatment Plan Summary: Medication management and Plan 67 year old woman in the hospital with delirium.  Presentation would be consistent with benzodiazepine and alcohol withdrawal.  Patient's last fill of her Xanax was on April 10 which means she would have probably been at just the point of running out.  Patient is not depressed not psychotic no indication for psychiatric hospitalization.  I see that the patient has already been put back on alprazolam 1 mg 4 times a day as she was taking at home.  This is probably a reasonably good idea since she is likely to just continue taking this medicine that she has been on chronically at home.  I would not change anything about the detox orders.  She looks like she is already starting to improve.  Once she becomes lucid the question can be raised with her or the family as to whether the alcohol use is a problem.  I would continue the sitter for now if needed for agitation.  Disposition: No evidence of imminent risk to self or others at present.   Patient does not meet criteria for psychiatric inpatient admission. Supportive therapy provided about ongoing stressors.  Alethia Berthold, MD 11/03/2017 6:50 PM

## 2017-11-03 NOTE — H&P (Addendum)
[place] [facility] [place] NAME: Wendy Arias    MR#:  [id number]  DATE OF BIRTH:  July 31, 1950  DATE OF ADMISSION:  11/02/2017  PRIMARY CARE PHYSICIAN: Elgie Collard, MD   REQUESTING/REFERRING PHYSICIAN:   CHIEF COMPLAINT:   Chief Complaint  Patient presents with  . Altered Mental Status    HISTORY OF PRESENT ILLNESS: Wendy Arias  is a 67 y.o. female with a known history of depression, hypertension and alcohol abuse. Patient is unable to provide history due to severe confusion.  Most of the information was taken from reviewing the medical chart and from discussion with emergency room physician. Patient was brought to emergency room for severe confusion noted by the family, when they visited her few hours ago.  Family found her laying on the floor with several prescription pill bottles, laying open around her.  Apparently she was agitated during the EMS route.  She is currently calm, alert, but completely disoriented. Blood test done emergency room are remarkable for creatinine level of 1.33, glucose level at 148.  AST/ALT are within normal limits.  Alcohol level is normal.  Urine drug screen is positive for tricyclics and benzodiazepines.  Salicylate level is within normal limits.  UA is negative for infection.  Brain CT, reviewed by myself, is negative for any acute findings. Patient is admitted for further evaluation and treatment.  PAST MEDICAL HISTORY:   Past Medical History:  Diagnosis Date  . Hypertension   . Osteoporosis     PAST SURGICAL HISTORY:  Past Surgical History:  Procedure Laterality Date  . ABDOMINAL HYSTERECTOMY    . CARDIAC SURGERY    . COLONOSCOPY WITH PROPOFOL N/A 04/12/2017   Procedure: COLONOSCOPY WITH PROPOFOL;  Surgeon: Lollie Sails, MD;  Location: [facility] ENDOSCOPY;  Service: Endoscopy;  Laterality: N/A;  . REDUCTION MAMMAPLASTY      SOCIAL HISTORY:  Social History   Tobacco Use  . Smoking status:  Current Some Day Smoker  . Smokeless tobacco: Never Used  Substance Use Topics  . Alcohol use: Yes    Comment: rarely    FAMILY HISTORY:  Family History  Problem Relation Age of Onset  . Breast cancer Mother 9  . Breast cancer Maternal Aunt 70  . Breast cancer Other 35    DRUG ALLERGIES:  Allergies  Allergen Reactions  . Penicillins     REVIEW OF SYSTEMS:   Unable to obtain, due to patient being confused.  MEDICATIONS AT HOME:  Prior to Admission medications   Medication Sig Start Date End Date Taking? Authorizing Provider  alprazolam Duanne Moron) 2 MG tablet Take 1 mg by mouth 4 (four) times daily. 10/05/17  Yes [provider]  busPIRone (BUSPAR) 10 MG tablet Take 10 mg by mouth 2 (two) times daily. 11/01/17  Yes [provider]  citalopram (CELEXA) 20 MG tablet Take 20 mg by mouth daily. 11/01/17  Yes [provider]  dicyclomine (BENTYL) 10 MG capsule Take 10 mg by mouth every 6 (six) hours as needed for spasms. 11/01/17  Yes [provider]  metoprolol succinate (TOPROL-XL) 50 MG 24 hr tablet Take 50 mg by mouth daily. Take with or immediately following a meal.   Yes [provider]  zolpidem (AMBIEN) 10 MG tablet Take 5-10 mg by mouth at bedtime and may repeat dose one time if needed. 10mg  at bedtime, may repeat 5mg  if needed 10/05/17  Yes [provider]  aspirin 325 MG tablet Take 325  mg by mouth daily.    [provider]  ibandronate (BONIVA) 150 MG tablet Take 150 mg by mouth every 30 (thirty) days. Take in the morning with a full glass of water, on an empty stomach, and do not take anything else by mouth or lie down for the next 30 min.    [provider]  predniSONE (DELTASONE) 50 MG tablet Take 1 tab PO daily for 5 days Patient not taking: Reported on 04/12/2017 09/14/16   Orbie Pyo, MD  promethazine (PHENERGAN) 25 MG tablet Take 1 tablet (25 mg total) by mouth every 6 (six) hours as needed  for nausea or vomiting. Patient not taking: Reported on 04/12/2017 09/14/16   Orbie Pyo, MD  simvastatin (ZOCOR) 40 MG tablet Take 40 mg by mouth daily.    [provider]  vitamin B-12 (CYANOCOBALAMIN) 100 MCG tablet Take 100 mcg by mouth daily.    [provider]      PHYSICAL EXAMINATION:   VITAL SIGNS: Blood pressure (!) 151/99, pulse 84, temperature 98.4 F (36.9 C), temperature source Axillary, resp. rate 18, height 5\' 2"  (1.575 m), weight 49.9 kg (110 lb), SpO2 94 %.  GENERAL:  67 y.o.-year-old patient lying in the bed with no acute distress.  EYES: Pupils equal, round, reactive to light and accommodation. No scleral icterus.   HEENT: Head atraumatic, normocephalic. Oropharynx and nasopharynx clear.  NECK:  Supple, no jugular venous distention. No thyroid enlargement, no tenderness.  LUNGS: Normal breath sounds bilaterally, no wheezing, rales,rhonchi or crepitation. No use of accessory muscles of respiration.  CARDIOVASCULAR: S1, S2 normal. No S3/S4.  ABDOMEN: Soft, nontender, nondistended. Bowel sounds present. No organomegaly or mass.  EXTREMITIES: No pedal edema, cyanosis, or clubbing.  NEUROLOGIC: No focal weakness.  Gait is unstable.  PSYCHIATRIC: The patient is alert, but completely disoriented.  SKIN: No obvious rash, lesion, or ulcer.   LABORATORY PANEL:   CBC Recent Labs  Lab 11/02/17 2343 11/03/17 0430  WBC 5.3 5.0  HGB 12.5 11.9*  HCT 36.6 35.4  PLT 332 341  MCV 94.8 95.9  MCH 32.4 32.1  MCHC 34.1 33.5  RDW 19.2* 19.9*  LYMPHSABS 1.2  --   MONOABS 0.5  --   EOSABS 0.0  --   BASOSABS 0.0  --    ------------------------------------------------------------------------------------------------------------------  Chemistries  Recent Labs  Lab 11/02/17 2343 11/03/17 0430  NA 141 139  K 3.7 3.6  CL 107 106  CO2 25 23  GLUCOSE 148* 166*  BUN 17 21*  CREATININE 1.33* 1.02*  CALCIUM 8.9 8.6*  AST 30  --   ALT 22  --    ALKPHOS 134*  --   BILITOT 0.4  --    ------------------------------------------------------------------------------------------------------------------ estimated creatinine clearance is 42.2 mL/min (A) (by C-G formula based on SCr of 1.02 mg/dL (H)). ------------------------------------------------------------------------------------------------------------------ No results for input(s): TSH, T4TOTAL, T3FREE, THYROIDAB in the last 72 hours.  Invalid input(s): FREET3   Coagulation profile Recent Labs  Lab 11/02/17 2343  INR 0.93   ------------------------------------------------------------------------------------------------------------------- No results for input(s): DDIMER in the last 72 hours. -------------------------------------------------------------------------------------------------------------------  Cardiac Enzymes No results for input(s): CKMB, TROPONINI, MYOGLOBIN in the last 168 hours.  Invalid input(s): CK ------------------------------------------------------------------------------------------------------------------ Invalid input(s): POCBNP  ---------------------------------------------------------------------------------------------------------------  Urinalysis    Component Value Date/Time   COLORURINE YELLOW (A) 11/03/2017 0228   APPEARANCEUR CLEAR (A) 11/03/2017 0228   LABSPEC 1.017 11/03/2017 0228   PHURINE 5.0 11/03/2017 0228   GLUCOSEU NEGATIVE 11/03/2017 0228   HGBUR  NEGATIVE 11/03/2017 0228   BILIRUBINUR NEGATIVE 11/03/2017 0228   KETONESUR NEGATIVE 11/03/2017 0228   PROTEINUR NEGATIVE 11/03/2017 0228   NITRITE NEGATIVE 11/03/2017 0228   LEUKOCYTESUR NEGATIVE 11/03/2017 0228     RADIOLOGY: Ct Head Wo Contrast  Result Date: 11/03/2017 CLINICAL DATA:  67 year old female with altered mental status. EXAM: CT HEAD WITHOUT CONTRAST TECHNIQUE: Contiguous axial images were obtained from the base of the skull through the vertex without  intravenous contrast. COMPARISON:  Head CT dated 08/25/2011 FINDINGS: Brain: No evidence of acute infarction, hemorrhage, hydrocephalus, extra-axial collection or mass lesion/mass effect. Vascular: No hyperdense vessel or unexpected calcification. Skull: Normal. Negative for fracture or focal lesion. Sinuses/Orbits: No acute finding. Other: None. IMPRESSION: Unremarkable noncontrast CT of the brain. Electronically Signed   By: Anner Crete M.D.   On: 11/03/2017 00:10    EKG: Orders placed or performed during the hospital encounter of 11/02/17  . EKG 12-Lead  . EKG 12-Lead    IMPRESSION AND PLAN:  1.  Acute encephalopathy, of unclear etiology.  Will check brain MRI to rule out Wernicke's.  We will start treatment with thiamine and magnesium.  Will place patient on CIWA protocol.  Neurology is consulted for further evaluation and treatment. 2.  Hypertension, stable, will restart home medications. 3.  Hyperlipidemia, stable, will restart home medications. 4.  Anxiety/depression disorder.  It is unclear whether the patient overdosed on some of her psych meds.  We will continue to monitor clinically closely for now and slowly restart some of her home medications, to avoid withdrawal symptoms.  5.  History of alcohol abuse and dependence, per family.  Will monitor patient for withdrawal symptoms.  We will place her on CIWA protocol.  6. ARF, likely prerenal. Will treat with IVF.  All the records are reviewed and case discussed with ED provider.   CODE STATUS: FULL    Code Status Orders  (From admission, onward)        Start     Ordered   11/03/17 0336  Full code  Continuous     11/03/17 0336    Code Status History    This patient has a current code status but no historical code status.       TOTAL TIME TAKING CARE OF THIS PATIENT: 45 minutes.    Amelia Jo M.D on 11/03/2017 at 5:51 AM  Between 7am to 6pm - Pager - [phone]  After 6pm go to [email or web address] - password [email or web address]  [facility]  Office  [phone]  CC: Primary care physician; Elgie Collard, MD

## 2017-11-03 NOTE — ED Notes (Addendum)
Pts family in family room for MD update and plan of care. Rifenbark, MD and this RN in room to provide family with details of pts care and plan. Pts family aware that they are not able to see pt at this time. Visiting hours given to family. Numbers left for contact purposes:  Mariana Kaufman (daughter) [phone] Ryan/Holly (Son/Wife) [phone]    [phone]

## 2017-11-03 NOTE — Consult Note (Signed)
Reason for Consult:Altered mental status Referring Physician: Verdell Carmine  CC: Altered mental status  HPI: Wendy Arias is an 67 y.o. female admitted with AMS.  Patient unable to provide history due to mental status therefore all history obtained from daughter.  At baseline patient is independent and able to drive and handle all affairs.  A few months ago went downhill similar to this but less severe.  Was diagnosed with depression and ETOH abuse.  Went with her sister to [place] and stayed some time.  When she returned she was much better. She lives with her son and niece.  Due to the situation she has spent much of her time in her room and ober the past few weeks has been slowly deteriorating again.  Yesterday patient was found in her room on the floor with multiple empty bottles around her.    Past Medical History:  Diagnosis Date  . Hypertension   . Osteoporosis     Past Surgical History:  Procedure Laterality Date  . ABDOMINAL HYSTERECTOMY    . CARDIAC SURGERY    . COLONOSCOPY WITH PROPOFOL N/A 04/12/2017   Procedure: COLONOSCOPY WITH PROPOFOL;  Surgeon: Lollie Sails, MD;  Location: [facility] ENDOSCOPY;  Service: Endoscopy;  Laterality: N/A;  . REDUCTION MAMMAPLASTY      Family History  Problem Relation Age of Onset  . Breast cancer Mother 22  . Breast cancer Maternal Aunt 70  . Breast cancer Other 44    Social History:  reports that she has been smoking.  She has never used smokeless tobacco. She reports that she drinks alcohol. Her drug history is not on file.  Allergies  Allergen Reactions  . Penicillins     Medications:  I have reviewed the patient's current medications. Prior to Admission:  Medications Prior to Admission  Medication Sig Dispense Refill Last Dose  . alprazolam (XANAX) 2 MG tablet Take 1 mg by mouth 4 (four) times daily.  2 unknown at unknown  . aspirin 325 MG tablet Take 325 mg by mouth daily.   Past Month at Unknown time  . busPIRone (BUSPAR) 10 MG tablet  Take 10 mg by mouth 2 (two) times daily.   unknown at unknown  . citalopram (CELEXA) 20 MG tablet Take 20 mg by mouth daily.   unknown at unknown  . dicyclomine (BENTYL) 10 MG capsule Take 10 mg by mouth every 6 (six) hours as needed for spasms.   prn at prn  . ibandronate (BONIVA) 150 MG tablet Take 150 mg by mouth every 30 (thirty) days. Take in the morning with a full glass of water, on an empty stomach, and do not take anything else by mouth or lie down for the next 30 min.   03/13/2017  . metoprolol succinate (TOPROL-XL) 50 MG 24 hr tablet Take 50 mg by mouth daily. Take with or immediately following a meal.     . simvastatin (ZOCOR) 40 MG tablet Take 40 mg by mouth daily.     . vitamin B-12 (CYANOCOBALAMIN) 1000 MCG tablet Take 1,000 mcg by mouth daily.     Marland Kitchen zolpidem (AMBIEN) 10 MG tablet Take 5-10 mg by mouth at bedtime and may repeat dose one time if needed. 10mg  at bedtime, may repeat 5mg  if needed  2 unknown at unknown  . predniSONE (DELTASONE) 50 MG tablet Take 1 tab PO daily for 5 days (Patient not taking: Reported on 04/12/2017) 5 tablet 0 Completed Course at Unknown time  . promethazine (PHENERGAN) 25  MG tablet Take 1 tablet (25 mg total) by mouth every 6 (six) hours as needed for nausea or vomiting. (Patient not taking: Reported on 04/12/2017) 15 tablet 0 Completed Course at Unknown time   Scheduled: . alprazolam  1 mg Oral QID  . aspirin EC  325 mg Oral Daily  . busPIRone  10 mg Oral BID  . citalopram  20 mg Oral Daily  . docusate sodium  100 mg Oral BID  . folic acid  1 mg Oral Daily  . heparin  5,000 Units Subcutaneous Q8H  . LORazepam  0-4 mg Intravenous Q6H   Followed by  . [START ON 11/05/2017] LORazepam  0-4 mg Intravenous Q12H  . metoprolol succinate  50 mg Oral Daily  . multivitamin with minerals  1 tablet Oral Daily  . simvastatin  40 mg Oral Daily  . thiamine  100 mg Oral Daily   Or  . thiamine  100 mg Intravenous Daily  . vitamin B-12  100 mcg Oral Daily  .  zolpidem  5 mg Oral QHS    ROS: History obtained from daughter  General ROS: negative for - chills, fatigue, fever, night sweats, weight gain or weight loss Psychological ROS: depression Ophthalmic ROS: negative for - blurry vision, double vision, eye pain or loss of vision ENT ROS: negative for - epistaxis, nasal discharge, oral lesions, sore throat, tinnitus or vertigo Allergy and Immunology ROS: negative for - hives or itchy/watery eyes Hematological and Lymphatic ROS: negative for - bleeding problems, bruising or swollen lymph nodes Endocrine ROS: negative for - galactorrhea, hair pattern changes, polydipsia/polyuria or temperature intolerance Respiratory ROS: negative for - cough, hemoptysis, shortness of breath or wheezing Cardiovascular ROS: negative for - chest pain, dyspnea on exertion, edema or irregular heartbeat Gastrointestinal ROS: abdominal pain, diarrhea, abdominal distention Genito-Urinary ROS: negative for - dysuria, hematuria, incontinence or urinary frequency/urgency Musculoskeletal ROS: negative for - joint swelling or muscular weakness Neurological ROS: as noted in HPI Dermatological ROS: negative for rash and skin lesion changes  Physical Examination: Blood pressure (!) 193/90, pulse 98, temperature 98.4 F (36.9 C), temperature source Axillary, resp. rate 18, height 5\' 2"  (1.575 m), weight 49.9 kg (110 lb), SpO2 94 %.  HEENT-  Normocephalic, no lesions, without obvious abnormality.  Normal external eye and conjunctiva.  Normal TM's bilaterally.  Normal auditory canals and external ears. Normal external nose, mucus membranes and septum.  Normal pharynx. Cardiovascular- S1, S2 normal, pulses palpable throughout   Lungs- chest clear, no wheezing, rales, normal symmetric air entry Abdomen- soft, non-tender; bowel sounds normal; no masses,  no organomegaly Extremities- no edema Lymph-no adenopathy palpable Musculoskeletal-no joint tenderness, deformity or  swelling Skin-warm and dry, no hyperpigmentation, vitiligo, or suspicious lesions  Neurological Examination   Mental Status: Alert.  Uncooperative for much of the examination.  Follows some simple commands but for the most part has difficulty with the neurological exam.  Knows she is in the hospital but does not know which one.  Speech at times is fluent and at other times is nonsensical.   Cranial Nerves: II: Does not cooperate to visualize discs.  Pupils slightly dilated and do not appear reactive.   III,IV, VI: Extra-ocular motions grossly intact bilaterally V,VII: smile symmetric, facial light touch sensation normal bilaterally VIII: hearing normal bilaterally IX,X: gag reflex unable to be tested XI: bilateral shoulder shrug XII: midline tongue extension Motor: Moves all extremities against gravity with no focal weakness noted. Sensory: Responds to light noxious stimuli throughout Deep Tendon  Reflexes: 2+ and symmetric with absent AJ's bilaterally Plantars: Unable to reliably obtain due to cooperation. Cerebellar: Unable to perform Gait: not tested due to safety concerns    Laboratory Studies:   Basic Metabolic Panel: Recent Labs  Lab 11/02/17 2343 11/03/17 0430  NA 141 139  K 3.7 3.6  CL 107 106  CO2 25 23  GLUCOSE 148* 166*  BUN 17 21*  CREATININE 1.33* 1.02*  CALCIUM 8.9 8.6*  MG  --  2.5*    Liver Function Tests: Recent Labs  Lab 11/02/17 2343  AST 30  ALT 22  ALKPHOS 134*  BILITOT 0.4  PROT 7.1  ALBUMIN 3.9   No results for input(s): LIPASE, AMYLASE in the last 168 hours. Recent Labs  Lab 11/02/17 2343  AMMONIA 31    CBC: Recent Labs  Lab 11/02/17 2343 11/03/17 0430  WBC 5.3 5.0  NEUTROABS 3.6  --   HGB 12.5 11.9*  HCT 36.6 35.4  MCV 94.8 95.9  PLT 332 341    Cardiac Enzymes: Recent Labs  Lab 11/02/17 2343  CKTOTAL 585*    BNP: Invalid input(s): POCBNP  CBG: Recent Labs  Lab 11/03/17 [place]*     Microbiology: No results found for this or any previous visit.  Coagulation Studies: Recent Labs    11/02/17 2343  LABPROT 12.4  INR 0.93    Urinalysis:  Recent Labs  Lab 11/03/17 0228  COLORURINE YELLOW*  LABSPEC 1.017  PHURINE 5.0  GLUCOSEU NEGATIVE  HGBUR NEGATIVE  BILIRUBINUR NEGATIVE  KETONESUR NEGATIVE  PROTEINUR NEGATIVE  NITRITE NEGATIVE  LEUKOCYTESUR NEGATIVE    Lipid Panel:     Component Value Date/Time   CHOL 218 (H) 08/12/2015 1534   CHOL 296 (H) 10/31/2013 1541   TRIG 106 08/12/2015 1534   TRIG 229 (H) 10/31/2013 1541   HDL 69 08/12/2015 1534   HDL 53 10/31/2013 1541   CHOLHDL 3.2 08/12/2015 1534   VLDL 21 08/12/2015 1534   VLDL 46 (H) 10/31/2013 1541   LDLCALC 128 (H) 08/12/2015 1534   LDLCALC 197 (H) 10/31/2013 1541    HgbA1C: No results found for: HGBA1C  Urine Drug Screen:      Component Value Date/Time   LABOPIA NONE DETECTED 11/03/2017 0228   COCAINSCRNUR NONE DETECTED 11/03/2017 0228   LABBENZ POSITIVE (A) 11/03/2017 0228   AMPHETMU NONE DETECTED 11/03/2017 0228   THCU NONE DETECTED 11/03/2017 0228   LABBARB NONE DETECTED 11/03/2017 0228    Alcohol Level:  Recent Labs  Lab 11/02/17 2343  ETH <10    Other results: EKG: sinus rhythm at 85 bpm.  Imaging: Ct Head Wo Contrast  Result Date: 11/03/2017 CLINICAL DATA:  67 year old female with altered mental status. EXAM: CT HEAD WITHOUT CONTRAST TECHNIQUE: Contiguous axial images were obtained from the base of the skull through the vertex without intravenous contrast. COMPARISON:  Head CT dated 08/25/2011 FINDINGS: Brain: No evidence of acute infarction, hemorrhage, hydrocephalus, extra-axial collection or mass lesion/mass effect. Vascular: No hyperdense vessel or unexpected calcification. Skull: Normal. Negative for fracture or focal lesion. Sinuses/Orbits: No acute finding. Other: None. IMPRESSION: Unremarkable noncontrast CT of the brain. Electronically Signed   By: Anner Crete M.D.   On: 11/03/2017 00:10   Mr Brain Wo Contrast  Result Date: 11/03/2017 CLINICAL DATA:  Altered level of consciousness EXAM: MRI HEAD WITHOUT CONTRAST TECHNIQUE: Multiplanar, multiecho pulse sequences of the brain and surrounding structures were obtained without intravenous contrast. COMPARISON:  Head CT from yesterday FINDINGS:  Brain: No acute infarction, hemorrhage, hydrocephalus, extra-axial collection or mass lesion. Vascular: Major flow voids are grossly preserved Skull and upper cervical spine: No evidence of marrow lesion. Sinuses/Orbits: Negative IMPRESSION: Motion degraded to a degree that findings could easily be obscured. No abnormality detected. Electronically Signed   By: Monte Fantasia M.D.   On: 11/03/2017 12:16     Assessment/Plan: 67 year old female with a history of depression and ETOH abuse presenting with altered mental status.  Mental status changes of a subacute presentation.  Patient found with multiple medication bottles in her midst as well.  No focality noted on neurological examination.  Some questionable serotonin signs.  MRI of the brain reviewed and despite artifact shows no acute changes.  At this point differential includes serotonin syndrome related to ingestion (patient TCA positive), ETOH withdrawal and/or psychotic depression.  Recommendations: 1.  Would discontinue all serotonin medications, psych involvement will be helpful. 2.  EEG 3.  CIWA   Alexis Goodell, MD Neurology [phone] 11/03/2017, 1:07 PM

## 2017-11-04 DIAGNOSIS — F13231 Sedative, hypnotic or anxiolytic dependence with withdrawal delirium: Secondary | ICD-10-CM

## 2017-11-04 LAB — GLUCOSE, CAPILLARY: GLUCOSE-CAPILLARY: 91 mg/dL (ref 65–99)

## 2017-11-04 MED ORDER — BUSPIRONE HCL 15 MG PO TABS
7.5000 mg | ORAL_TABLET | Freq: Two times a day (BID) | ORAL | Status: DC
  Administered 2017-11-04: 15:00:00 7.5 mg via ORAL
  Filled 2017-11-04 (×2): qty 1

## 2017-11-04 NOTE — Consult Note (Signed)
Wendy Arias is an established patient with [facility] in [place]. I made an appointment for her for 11/10/2017 at 2:45 PM. She must have her insurance card and a photo ID. They will check with her insurer and call her on Monday if problems.  If the patient unable to keep this appointment, she has to call. If she misses this appointment, she will not be able to return to [facility].

## 2017-11-04 NOTE — Progress Notes (Signed)
Pt vss. A&O. Eager to be dc.  meds given. Dc instructions given.  Follow appt instructions to call Monday for appt. And medicine instructions given.  Dc home.

## 2017-11-04 NOTE — Progress Notes (Signed)
Subjective: Patient much improved today.  Sitting on side of bed speaking with visitors.    Objective: Current vital signs: BP (!) 149/92 (BP Location: Left Arm)   Pulse 78   Temp (!) 97.4 F (36.3 C) (Axillary)   Resp 16   Ht 5\' 2"  (1.575 m)   Wt 52.3 kg (115 lb 3.2 oz)   SpO2 96%   BMI 21.07 kg/m  Vital signs in last 24 hours: Temp:  [97.4 F (36.3 C)-98.9 F (37.2 C)] 97.4 F (36.3 C) (05/10 0925) Pulse Rate:  [75-81] 78 (05/10 0925) Resp:  [16-18] 16 (05/10 0925) BP: (110-170)/(75-96) 149/92 (05/10 0925) SpO2:  [93 %-96 %] 96 % (05/10 0925) Weight:  [52.3 kg (115 lb 3.2 oz)] 52.3 kg (115 lb 3.2 oz) (05/10 0500)  Intake/Output from previous day: 05/09 0701 - 05/10 0700 In: 1230 [I.V.:1230] Out: -  Intake/Output this shift: Total I/O In: 240 [P.O.:240] Out: -  Nutritional status:  Diet Order           Diet Heart Room service appropriate? Yes; Fluid consistency: Thin  Diet effective now          Neurologic Exam: Mental Status: Alert, oriented, thought content appropriate.  Speech fluent without evidence of aphasia.  Able to follow 3 step commands without difficulty. Cranial Nerves: II: Discs flat bilaterally; Visual fields grossly normal, pupils equal, round, reactive to light and accommodation III,IV, VI: ptosis not present, extra-ocular motions intact bilaterally V,VII: smile symmetric, facial light touch sensation normal bilaterally VIII: hearing normal bilaterally IX,X: gag reflex present XI: bilateral shoulder shrug XII: midline tongue extension Motor: 5/5 throughout    Lab Results: Basic Metabolic Panel: Recent Labs  Lab 11/02/17 2343 11/03/17 0430  NA 141 139  K 3.7 3.6  CL 107 106  CO2 25 23  GLUCOSE 148* 166*  BUN 17 21*  CREATININE 1.33* 1.02*  CALCIUM 8.9 8.6*  MG  --  2.5*    Liver Function Tests: Recent Labs  Lab 11/02/17 2343  AST 30  ALT 22  ALKPHOS 134*  BILITOT 0.4  PROT 7.1  ALBUMIN 3.9   No results for input(s):  LIPASE, AMYLASE in the last 168 hours. Recent Labs  Lab 11/02/17 2343  AMMONIA 31    CBC: Recent Labs  Lab 11/02/17 2343 11/03/17 0430  WBC 5.3 5.0  NEUTROABS 3.6  --   HGB 12.5 11.9*  HCT 36.6 35.4  MCV 94.8 95.9  PLT 332 341    Cardiac Enzymes: Recent Labs  Lab 11/02/17 2343  CKTOTAL 585*    Lipid Panel: No results for input(s): CHOL, TRIG, HDL, CHOLHDL, VLDL, LDLCALC in the last 168 hours.  CBG: Recent Labs  Lab 11/03/17 0753 11/04/17 0733  GLUCAP 138* 91    Microbiology: No results found for this or any previous visit.  Coagulation Studies: Recent Labs    11/02/17 04-Oct-2341  LABPROT 12.4  INR 0.93    Imaging: Ct Head Wo Contrast  Result Date: 11/03/2017 CLINICAL DATA:  67 year old female with altered mental status. EXAM: CT HEAD WITHOUT CONTRAST TECHNIQUE: Contiguous axial images were obtained from the base of the skull through the vertex without intravenous contrast. COMPARISON:  Head CT dated 08/25/2011 FINDINGS: Brain: No evidence of acute infarction, hemorrhage, hydrocephalus, extra-axial collection or mass lesion/mass effect. Vascular: No hyperdense vessel or unexpected calcification. Skull: Normal. Negative for fracture or focal lesion. Sinuses/Orbits: No acute finding. Other: None. IMPRESSION: Unremarkable noncontrast CT of the brain. Electronically Signed   By: Milas Hock  Radparvar M.D.   On: 11/03/2017 00:10   Mr Brain Wo Contrast  Result Date: 11/03/2017 CLINICAL DATA:  Altered level of consciousness EXAM: MRI HEAD WITHOUT CONTRAST TECHNIQUE: Multiplanar, multiecho pulse sequences of the brain and surrounding structures were obtained without intravenous contrast. COMPARISON:  Head CT from yesterday FINDINGS: Brain: No acute infarction, hemorrhage, hydrocephalus, extra-axial collection or mass lesion. Vascular: Major flow voids are grossly preserved Skull and upper cervical spine: No evidence of marrow lesion. Sinuses/Orbits: Negative IMPRESSION: Motion  degraded to a degree that findings could easily be obscured. No abnormality detected. Electronically Signed   By: Monte Fantasia M.D.   On: 11/03/2017 12:16    Medications:  I have reviewed the patient's current medications. Scheduled: . alprazolam  1 mg Oral QID  . aspirin EC  325 mg Oral Daily  . busPIRone  7.5 mg Oral BID  . citalopram  20 mg Oral Daily  . docusate sodium  100 mg Oral BID  . folic acid  1 mg Oral Daily  . heparin  5,000 Units Subcutaneous Q8H  . LORazepam  0-4 mg Intravenous Q6H   Followed by  . [START ON 11/05/2017] LORazepam  0-4 mg Intravenous Q12H  . metoprolol succinate  50 mg Oral Daily  . multivitamin with minerals  1 tablet Oral Daily  . simvastatin  40 mg Oral Daily  . thiamine  100 mg Oral Daily   Or  . thiamine  100 mg Intravenous Daily  . vitamin B-12  100 mcg Oral Daily  . zolpidem  5 mg Oral QHS    Assessment/Plan: Patient much improved.  Agrees that she is psychiatric evaluation.  Hopefully medications can  Be addressed at that time as well.  EEG unremarkable.    No further neurologic intervention is recommended at this time.  If further questions arise, please call or page at that time.  Thank you for allowing neurology to participate in the care of this patient.    LOS: 1 day   Alexis Goodell, MD Neurology [phone] 11/04/2017  12:18 PM

## 2017-11-04 NOTE — Discharge Summary (Signed)
[facility] NAME: Wendy Arias    MR#:  [id number]  DATE OF BIRTH:  1950-08-03  DATE OF ADMISSION:  11/02/2017 ADMITTING PHYSICIAN: Amelia Jo, MD  DATE OF DISCHARGE: 11/04/2017  PRIMARY CARE PHYSICIAN: Elgie Collard, MD    ADMISSION DIAGNOSIS:  Encephalopathy [G93.40]  DISCHARGE DIAGNOSIS:  Principal Problem:   Benzodiazepine withdrawal ([place]) Active Problems:   Acute encephalopathy   SECONDARY DIAGNOSIS:   Past Medical History:  Diagnosis Date  . Hypertension   . Osteoporosis     HOSPITAL COURSE:   67 year old female with past medical history of osteoporosis, hypertension, anxiety, hyperlipidemia, irritable bowel syndrome, who presents to the hospital due to her altered mental status/confusion.  1.  Altered mental status/confusion- this was secondary to a combination of alcohol withdrawal and also withdrawal from benzos. -Patient apparently takes high doses of Xanax and also BuSpar and had stopped them recently as she ran out of her medications.  Patient was admitted to the hospital placed on CIWA protocol.  A neurology and also a psychiatry consult was obtained. - Neurology obtained the EEG which showed no evidence of seizures, and they thought this was likely withdrawal as mentioned above.  After being placed back on her Xanax, BuSpar and being placed on CIWA protocol her mental status has significantly improved and is back to baseline. - She will continue her Xanax and follow-up with psychiatry as an outpatient.  Patient's neurologic work-up including CT head, MRI of the brain were negative for acute pathology.  2.  Alcohol abuse- this was treated with CIWA protocol, she has improved and her mental status is back to baseline.  She was also given thiamine and folate.  3.  Anxiety- She will continue Xanax, BuSpar.  Appreciate psychiatry input and she will continue these medications and follow-up with her  psychiatrist as an outpatient.  4.  Depression- she will continue Celexa.  5.  Hyperlipidemia- she will continue Zocor.  6. Essential HTN - she will cont. Toprol.      DISCHARGE CONDITIONS:   Stable.   CONSULTS OBTAINED:  Treatment Team:  Alexis Goodell, MD Clapacs, Madie Reno, MD  DRUG ALLERGIES:   Allergies  Allergen Reactions  . Penicillins     Has patient had a PCN reaction causing immediate rash, facial/tongue/throat swelling, SOB or lightheadedness with hypotension: Yes Has patient had a PCN reaction causing severe rash involving mucus membranes or skin necrosis: Unknown Has patient had a PCN reaction that required hospitalization: Unknown Has patient had a PCN reaction occurring within the last 10 years: Unknown If all of the above answers are "NO", then may proceed with Cephalosporin use.    DISCHARGE MEDICATIONS:   Allergies as of 11/04/2017      Reactions   Penicillins    Has patient had a PCN reaction causing immediate rash, facial/tongue/throat swelling, SOB or lightheadedness with hypotension: Yes Has patient had a PCN reaction causing severe rash involving mucus membranes or skin necrosis: Unknown Has patient had a PCN reaction that required hospitalization: Unknown Has patient had a PCN reaction occurring within the last 10 years: Unknown If all of the above answers are "NO", then may proceed with Cephalosporin use.      Medication List    STOP taking these medications   predniSONE 50 MG tablet Commonly known as:  DELTASONE   promethazine 25 MG tablet Commonly known as:  PHENERGAN     TAKE these medications   alprazolam 2 MG  tablet Commonly known as:  XANAX Take 1 mg by mouth 4 (four) times daily.   aspirin 325 MG tablet Take 325 mg by mouth daily.   busPIRone 10 MG tablet Commonly known as:  BUSPAR Take 10 mg by mouth 2 (two) times daily.   citalopram 20 MG tablet Commonly known as:  CELEXA Take 20 mg by mouth daily.   dicyclomine  10 MG capsule Commonly known as:  BENTYL Take 10 mg by mouth every 6 (six) hours as needed for spasms.   ibandronate 150 MG tablet Commonly known as:  BONIVA Take 150 mg by mouth every 30 (thirty) days. Take in the morning with a full glass of water, on an empty stomach, and do not take anything else by mouth or lie down for the next 30 min.   metoprolol succinate 50 MG 24 hr tablet Commonly known as:  TOPROL-XL Take 50 mg by mouth daily. Take with or immediately following a meal.   simvastatin 40 MG tablet Commonly known as:  ZOCOR Take 40 mg by mouth daily.   vitamin B-12 1000 MCG tablet Commonly known as:  CYANOCOBALAMIN Take 1,000 mcg by mouth daily.   zolpidem 10 MG tablet Commonly known as:  AMBIEN Take 5-10 mg by mouth at bedtime and may repeat dose one time if needed. 10mg  at bedtime, may repeat 5mg  if needed         DISCHARGE INSTRUCTIONS:   DIET:  Cardiac diet  DISCHARGE CONDITION:  Stable  ACTIVITY:  Activity as tolerated  OXYGEN:  Home Oxygen: No.   Oxygen Delivery: room air  DISCHARGE LOCATION:  home   If you experience worsening of your admission symptoms, develop shortness of breath, life threatening emergency, suicidal or homicidal thoughts you must seek medical attention immediately by calling 911 or calling your MD immediately  if symptoms less severe.  You Must read complete instructions/literature along with all the possible adverse reactions/side effects for all the Medicines you take and that have been prescribed to you. Take any new Medicines after you have completely understood and accpet all the possible adverse reactions/side effects.   Please note  You were cared for by a hospitalist during your hospital stay. If you have any questions about your discharge medications or the care you received while you were in the hospital after you are discharged, you can call the unit and asked to speak with the hospitalist on call if the hospitalist  that took care of you is not available. Once you are discharged, your primary care physician will handle any further medical issues. Please note that NO REFILLS for any discharge medications will be authorized once you are discharged, as it is imperative that you return to your primary care physician (or establish a relationship with a primary care physician if you do not have one) for your aftercare needs so that they can reassess your need for medications and monitor your lab values.     Today   No acute events overnight.  Mental status much improved and back to baseline.  Seen by psychiatry overnight they recommend continuing her Xanax and her maintenance anxiety meds.  She will follow-up with psychiatry as an outpatient.  She stable from the psychiatric standpoint to be discharged home.  VITAL SIGNS:  Blood pressure (!) 154/85, pulse 72, temperature 98.2 F (36.8 C), temperature source Oral, resp. rate (!) 22, height 5\' 2"  (1.575 m), weight 52.3 kg (115 lb 3.2 oz), SpO2 97 %.  I/O:  Intake/Output Summary (Last 24 hours) at 11/04/2017 1545 Last data filed at 11/04/2017 1300 Gross per 24 hour  Intake 1950 ml  Output -  Net 1950 ml    PHYSICAL EXAMINATION:  GENERAL:  67 y.o.-year-old patient lying in the bed with no acute distress.  EYES: Pupils equal, round, reactive to light and accommodation. No scleral icterus. Extraocular muscles intact.  HEENT: Head atraumatic, normocephalic. Oropharynx and nasopharynx clear.  NECK:  Supple, no jugular venous distention. No thyroid enlargement, no tenderness.  LUNGS: Normal breath sounds bilaterally, no wheezing, rales,rhonchi. No use of accessory muscles of respiration.  CARDIOVASCULAR: S1, S2 normal. No murmurs, rubs, or gallops.  ABDOMEN: Soft, non-tender, non-distended. Bowel sounds present. No organomegaly or mass.  EXTREMITIES: No pedal edema, cyanosis, or clubbing.  NEUROLOGIC: Cranial nerves II through XII are intact. No focal motor or  sensory defecits b/l.  PSYCHIATRIC: The patient is alert and oriented x 3. Anxious.  SKIN: No obvious rash, lesion, or ulcer.   DATA REVIEW:   CBC Recent Labs  Lab 11/03/17 0430  WBC 5.0  HGB 11.9*  HCT 35.4  PLT 341    Chemistries  Recent Labs  Lab 11/02/17 2343 11/03/17 0430  NA 141 139  K 3.7 3.6  CL 107 106  CO2 25 23  GLUCOSE 148* 166*  BUN 17 21*  CREATININE 1.33* 1.02*  CALCIUM 8.9 8.6*  MG  --  2.5*  AST 30  --   ALT 22  --   ALKPHOS 134*  --   BILITOT 0.4  --     Cardiac Enzymes No results for input(s): TROPONINI in the last 168 hours.  Microbiology Results  No results found for this or any previous visit.  RADIOLOGY:  Ct Head Wo Contrast  Result Date: 11/03/2017 CLINICAL DATA:  67 year old female with altered mental status. EXAM: CT HEAD WITHOUT CONTRAST TECHNIQUE: Contiguous axial images were obtained from the base of the skull through the vertex without intravenous contrast. COMPARISON:  Head CT dated 08/25/2011 FINDINGS: Brain: No evidence of acute infarction, hemorrhage, hydrocephalus, extra-axial collection or mass lesion/mass effect. Vascular: No hyperdense vessel or unexpected calcification. Skull: Normal. Negative for fracture or focal lesion. Sinuses/Orbits: No acute finding. Other: None. IMPRESSION: Unremarkable noncontrast CT of the brain. Electronically Signed   By: Anner Crete M.D.   On: 11/03/2017 00:10   Mr Brain Wo Contrast  Result Date: 11/03/2017 CLINICAL DATA:  Altered level of consciousness EXAM: MRI HEAD WITHOUT CONTRAST TECHNIQUE: Multiplanar, multiecho pulse sequences of the brain and surrounding structures were obtained without intravenous contrast. COMPARISON:  Head CT from yesterday FINDINGS: Brain: No acute infarction, hemorrhage, hydrocephalus, extra-axial collection or mass lesion. Vascular: Major flow voids are grossly preserved Skull and upper cervical spine: No evidence of marrow lesion. Sinuses/Orbits: Negative IMPRESSION:  Motion degraded to a degree that findings could easily be obscured. No abnormality detected. Electronically Signed   By: Monte Fantasia M.D.   On: 11/03/2017 12:16      Management plans discussed with the patient, family and they are in agreement.  CODE STATUS:     Code Status Orders  (From admission, onward)        Start     Ordered   11/03/17 0336  Full code  Continuous     11/03/17 0336    Code Status History    This patient has a current code status but no historical code status.      TOTAL TIME TAKING CARE OF THIS PATIENT: 50  minutes.    Henreitta Leber M.D on 11/04/2017 at 3:45 PM  Between 7am to 6pm - Pager - [phone]  After 6pm go to [email or web address] - Proofreader  [facility]  Office  [phone]  CC: Primary care physician; Elgie Collard, MD

## 2017-11-04 NOTE — Progress Notes (Signed)
[facility] MD Progress Note  11/04/2017 1:52 PM Wendy Arias  MRN:  [id number]  Subjective:  This is a follow up on a consult by Dr. Weber Cooks last night. Wendy Arias was admitted for AMS status resulting from Xanax withdrawal. She run out. Xanax was restarted here and the patient is back to her baseline. VS are stable. She has been taking high dose of Xanax 1 mg QID for over 20 years and wants to continue.  Spoke with Dr. Verdell Carmine about follow up with psychiatry. Appointment with [facility] scheduled. Spoke with her family at bedside who assured me that the patient has refills on Xanax and BuSpar to pick up immediately.    Principal Problem: Benzodiazepine withdrawal ([place]) Diagnosis:   Patient Active Problem List   Diagnosis Date Noted  . Acute encephalopathy [G93.40] 11/03/2017  . Benzodiazepine withdrawal ([place]) [F13.239] 11/03/2017   Total Time spent with patient: 20 minutes  Past Psychiatric History: anxiety  Past Medical History:  Past Medical History:  Diagnosis Date  . Hypertension   . Osteoporosis     Past Surgical History:  Procedure Laterality Date  . ABDOMINAL HYSTERECTOMY    . CARDIAC SURGERY    . COLONOSCOPY WITH PROPOFOL N/A 04/12/2017   Procedure: COLONOSCOPY WITH PROPOFOL;  Surgeon: Lollie Sails, MD;  Location: [facility] ENDOSCOPY;  Service: Endoscopy;  Laterality: N/A;  . REDUCTION MAMMAPLASTY     Family History:  Family History  Problem Relation Age of Onset  . Breast cancer Mother 17  . Breast cancer Maternal Aunt 70  . Breast cancer Other 51   Family Psychiatric  History: none Social History:  Social History   Substance and Sexual Activity  Alcohol Use Yes   Comment: rarely     Social History   Substance and Sexual Activity  Drug Use Not on file    Social History   Socioeconomic History  . Marital status: Divorced    Spouse name: Not on file  . Number of children: Not on file  . Years of education: Not on file  . Highest education level: Not on file   Occupational History  . Not on file  Social Needs  . Financial resource strain: Not on file  . Food insecurity:    Worry: Not on file    Inability: Not on file  . Transportation needs:    Medical: Not on file    Non-medical: Not on file  Tobacco Use  . Smoking status: Current Some Day Smoker  . Smokeless tobacco: Never Used  Substance and Sexual Activity  . Alcohol use: Yes    Comment: rarely  . Drug use: Not on file  . Sexual activity: Not on file  Lifestyle  . Physical activity:    Days per week: Not on file    Minutes per session: Not on file  . Stress: Not on file  Relationships  . Social connections:    Talks on phone: Not on file    Gets together: Not on file    Attends religious service: Not on file    Active member of club or organization: Not on file    Attends meetings of clubs or organizations: Not on file    Relationship status: Not on file  Other Topics Concern  . Not on file  Social History Narrative  . Not on file   Additional Social History:  Sleep: Fair  Appetite:  Fair  Current Medications: Current Facility-Administered Medications  Medication Dose Route Frequency Provider Last Rate Last Dose  . 0.9 %  sodium chloride infusion   Intravenous Continuous Amelia Jo, MD 75 mL/hr at 11/04/17 0932    . acetaminophen (TYLENOL) tablet 650 mg  650 mg Oral Q6H PRN Amelia Jo, MD       Or  . acetaminophen (TYLENOL) suppository 650 mg  650 mg Rectal Q6H PRN Amelia Jo, MD      . ALPRAZolam Duanne Moron) tablet 1 mg  1 mg Oral QID Amelia Jo, MD   1 mg at 11/04/17 4166  . aspirin EC tablet 325 mg  325 mg Oral Daily Amelia Jo, MD   325 mg at 11/04/17 0630  . bisacodyl (DULCOLAX) EC tablet 5 mg  5 mg Oral Daily PRN Amelia Jo, MD      . busPIRone (BUSPAR) tablet 7.5 mg  7.5 mg Oral BID Henreitta Leber, MD      . citalopram (CELEXA) tablet 20 mg  20 mg Oral Daily Amelia Jo, MD   20 mg at 11/04/17 0929  .  dicyclomine (BENTYL) capsule 10 mg  10 mg Oral Q6H PRN Amelia Jo, MD      . docusate sodium (COLACE) capsule 100 mg  100 mg Oral BID Amelia Jo, MD   100 mg at 11/03/17 2342  . folic acid (FOLVITE) tablet 1 mg  1 mg Oral Daily Amelia Jo, MD   1 mg at 11/04/17 0928  . heparin injection 5,000 Units  5,000 Units Subcutaneous Q8H Amelia Jo, MD   5,000 Units at 11/04/17 0540  . HYDROcodone-acetaminophen (NORCO/VICODIN) 5-325 MG per tablet 1-2 tablet  1-2 tablet Oral Q4H PRN Amelia Jo, MD      . LORazepam (ATIVAN) injection 0-4 mg  0-4 mg Intravenous Q6H Amelia Jo, MD   2 mg at 11/03/17 1015   Followed by  . [START ON 11/05/2017] LORazepam (ATIVAN) injection 0-4 mg  0-4 mg Intravenous Q12H Amelia Jo, MD      . LORazepam (ATIVAN) tablet 1 mg  1 mg Oral Q6H PRN Amelia Jo, MD       Or  . LORazepam (ATIVAN) injection 1 mg  1 mg Intravenous Q6H PRN Amelia Jo, MD      . metoprolol succinate (TOPROL-XL) 24 hr tablet 50 mg  50 mg Oral Daily Amelia Jo, MD   50 mg at 11/04/17 0929  . multivitamin with minerals tablet 1 tablet  1 tablet Oral Daily Amelia Jo, MD   1 tablet at 11/03/17 1016  . ondansetron (ZOFRAN) tablet 4 mg  4 mg Oral Q6H PRN Amelia Jo, MD       Or  . ondansetron [facility]) injection 4 mg  4 mg Intravenous Q6H PRN Amelia Jo, MD      . simvastatin (ZOCOR) tablet 40 mg  40 mg Oral Daily Amelia Jo, MD   40 mg at 11/03/17 1815  . thiamine (VITAMIN B-1) tablet 100 mg  100 mg Oral Daily Amelia Jo, MD   100 mg at 11/04/17 1601   Or  . thiamine (B-1) injection 100 mg  100 mg Intravenous Daily Amelia Jo, MD   100 mg at 11/03/17 1015  . traZODone (DESYREL) tablet 25 mg  25 mg Oral QHS PRN Amelia Jo, MD      . vitamin B-12 (CYANOCOBALAMIN) tablet 100 mcg  100 mcg Oral Daily Amelia Jo, MD   100 mcg at 11/04/17 0929  .  zolpidem (AMBIEN) tablet 5 mg  5 mg Oral QHS Amelia Jo, MD   5 mg at 11/03/17 2342    Lab Results:  Results for  orders placed or performed during the hospital encounter of 11/02/17 (from the past 48 hour(s))  Comprehensive metabolic panel     Status: Abnormal   Collection Time: 11/02/17 11:43 PM  Result Value Ref Range   Sodium 141 135 - 145 mmol/L   Potassium 3.7 3.5 - 5.1 mmol/L   Chloride 107 101 - 111 mmol/L   CO2 25 22 - 32 mmol/L   Glucose, Bld 148 (H) 65 - 99 mg/dL   BUN 17 6 - 20 mg/dL   Creatinine, Ser 1.33 (H) 0.44 - 1.00 mg/dL   Calcium 8.9 8.9 - 10.3 mg/dL   Total Protein 7.1 6.5 - 8.1 g/dL   Albumin 3.9 3.5 - 5.0 g/dL   AST 30 15 - 41 U/L   ALT 22 14 - 54 U/L   Alkaline Phosphatase 134 (H) 38 - 126 U/L   Total Bilirubin 0.4 0.3 - 1.2 mg/dL   GFR calc non Af Amer 40 (L) >60 mL/min   GFR calc Af Amer 47 (L) >60 mL/min    Comment: (NOTE) The eGFR has been calculated using the CKD EPI equation. This calculation has not been validated in all clinical situations. eGFR's persistently <60 mL/min signify possible Chronic Kidney Disease.    Anion gap 9 5 - 15    Comment: Performed at [facility], [address] [place], [place] [address]  Acetaminophen level     Status: Abnormal   Collection Time: 11/02/17 11:43 PM  Result Value Ref Range   Acetaminophen (Tylenol), Serum <10 (L) 10 - 30 ug/mL    Comment:        THERAPEUTIC CONCENTRATIONS VARY SIGNIFICANTLY. A RANGE OF 10-30 ug/mL MAY BE AN EFFECTIVE CONCENTRATION FOR MANY PATIENTS. HOWEVER, SOME ARE BEST TREATED AT CONCENTRATIONS OUTSIDE THIS RANGE. ACETAMINOPHEN CONCENTRATIONS >150 ug/mL AT 4 HOURS AFTER INGESTION AND >50 ug/mL AT 12 HOURS AFTER INGESTION ARE OFTEN ASSOCIATED WITH TOXIC REACTIONS. Performed at [facility], [address] [place], [place] [address]   Ethanol     Status: None   Collection Time: 11/02/17 11:43 PM  Result Value Ref Range   Alcohol, Ethyl (B) <10 <10 mg/dL    Comment:        LOWEST DETECTABLE LIMIT FOR SERUM ALCOHOL IS 10 mg/dL FOR MEDICAL PURPOSES ONLY Performed  at [facility], [address] [place], [place] [address]   Salicylate level     Status: None   Collection Time: 11/02/17 11:43 PM  Result Value Ref Range   Salicylate Lvl <3.7 2.8 - 30.0 mg/dL    Comment: Performed at [facility], [address] [place], [place] [address]  CBC with Differential     Status: Abnormal   Collection Time: 11/02/17 11:43 PM  Result Value Ref Range   WBC 5.3 3.6 - 11.0 K/uL   RBC 3.87 3.80 - 5.20 MIL/uL   Hemoglobin 12.5 12.0 - 16.0 g/dL   HCT 36.6 35.0 - 47.0 %   MCV 94.8 80.0 - 100.0 fL   MCH 32.4 26.0 - 34.0 pg   MCHC 34.1 32.0 - 36.0 g/dL   RDW 19.2 (H) 11.5 - 14.5 %   Platelets 332 150 - 440 K/uL   Neutrophils Relative % 66 %   Neutro Abs 3.6 1.4 - 6.5 K/uL   Lymphocytes Relative 23 %  Lymphs Abs 1.2 1.0 - 3.6 K/uL   Monocytes Relative 9 %   Monocytes Absolute 0.5 0.2 - 0.9 K/uL   Eosinophils Relative 1 %   Eosinophils Absolute 0.0 0 - 0.7 K/uL   Basophils Relative 1 %   Basophils Absolute 0.0 0 - 0.1 K/uL    Comment: Performed at [facility], [address] [place], [place] [address]  CK     Status: Abnormal   Collection Time: 11/02/17 11:43 PM  Result Value Ref Range   Total CK 585 (H) 38 - 234 U/L    Comment: Performed at [facility] [address] [place], [place] [address]  Ammonia     Status: None   Collection Time: 11/02/17 11:43 PM  Result Value Ref Range   Ammonia 31 9 - 35 umol/L    Comment: Performed at [facility], [address] [place], [place] [address]  Protime-INR     Status: None   Collection Time: 11/02/17 11:43 PM  Result Value Ref Range   Prothrombin Time 12.4 11.4 - 15.2 seconds   INR 0.93     Comment: Performed at [facility], [address] [place], [place] [address]  Urinalysis, Complete w Microscopic     Status: Abnormal   Collection Time: 11/03/17  2:28 AM  Result Value Ref Range   Color, Urine YELLOW (A) YELLOW   APPearance CLEAR (A)  CLEAR   Specific Gravity, Urine 1.017 1.005 - 1.030   pH 5.0 5.0 - 8.0   Glucose, UA NEGATIVE NEGATIVE mg/dL   Hgb urine dipstick NEGATIVE NEGATIVE   Bilirubin Urine NEGATIVE NEGATIVE   Ketones, ur NEGATIVE NEGATIVE mg/dL   Protein, ur NEGATIVE NEGATIVE mg/dL   Nitrite NEGATIVE NEGATIVE   Leukocytes, UA NEGATIVE NEGATIVE   RBC / HPF 0-5 0 - 5 RBC/hpf   WBC, UA 0-5 0 - 5 WBC/hpf   Bacteria, UA NONE SEEN NONE SEEN   Squamous Epithelial / LPF 0-5 0 - 5    Comment: Please note change in reference range.   Mucus PRESENT    Hyaline Casts, UA PRESENT     Comment: Performed at [facility], [address] [place], [place] [address]  Urine Drug Screen, Qualitative     Status: Abnormal   Collection Time: 11/03/17  2:28 AM  Result Value Ref Range   Tricyclic, Ur Screen POSITIVE (A) NONE DETECTED   Amphetamines, Ur Screen NONE DETECTED NONE DETECTED   MDMA (Ecstasy)Ur Screen NONE DETECTED NONE DETECTED   Cocaine Metabolite,Ur [place] NONE DETECTED NONE DETECTED   Opiate, Ur Screen NONE DETECTED NONE DETECTED   Phencyclidine (PCP) Ur S NONE DETECTED NONE DETECTED   Cannabinoid 50 Ng, Ur [place] NONE DETECTED NONE DETECTED   Barbiturates, Ur Screen NONE DETECTED NONE DETECTED   Benzodiazepine, Ur Scrn POSITIVE (A) NONE DETECTED   Methadone Scn, Ur NONE DETECTED NONE DETECTED    Comment: (NOTE) Tricyclics + metabolites, urine    Cutoff 1000 ng/mL Amphetamines + metabolites, urine  Cutoff 1000 ng/mL MDMA (Ecstasy), urine              Cutoff 500 ng/mL Cocaine Metabolite, urine          Cutoff 300 ng/mL Opiate + metabolites, urine        Cutoff 300 ng/mL Phencyclidine (PCP), urine         Cutoff 25 ng/mL Cannabinoid, urine  Cutoff 50 ng/mL Barbiturates + metabolites, urine  Cutoff 200 ng/mL Benzodiazepine, urine              Cutoff 200 ng/mL Methadone, urine                   Cutoff 300 ng/mL The urine drug screen provides only a preliminary, unconfirmed analytical test  result and should not be used for non-medical purposes. Clinical consideration and professional judgment should be applied to any positive drug screen result due to possible interfering substances. A more specific alternate chemical method must be used in order to obtain a confirmed analytical result. Gas chromatography / mass spectrometry (GC/MS) is the preferred confirmat ory method. Performed at [facility], [address] [place], [place] [address]   Basic metabolic panel     Status: Abnormal   Collection Time: 11/03/17  4:30 AM  Result Value Ref Range   Sodium 139 135 - 145 mmol/L   Potassium 3.6 3.5 - 5.1 mmol/L   Chloride 106 101 - 111 mmol/L   CO2 23 22 - 32 mmol/L   Glucose, Bld 166 (H) 65 - 99 mg/dL   BUN 21 (H) 6 - 20 mg/dL   Creatinine, Ser 1.02 (H) 0.44 - 1.00 mg/dL   Calcium 8.6 (L) 8.9 - 10.3 mg/dL   GFR calc non Af Amer 56 (L) >60 mL/min   GFR calc Af Amer >60 >60 mL/min    Comment: (NOTE) The eGFR has been calculated using the CKD EPI equation. This calculation has not been validated in all clinical situations. eGFR's persistently <60 mL/min signify possible Chronic Kidney Disease.    Anion gap 10 5 - 15    Comment: Performed at [facility], [address] [place], [place] [address]  CBC     Status: Abnormal   Collection Time: 11/03/17  4:30 AM  Result Value Ref Range   WBC 5.0 3.6 - 11.0 K/uL   RBC 3.69 (L) 3.80 - 5.20 MIL/uL   Hemoglobin 11.9 (L) 12.0 - 16.0 g/dL   HCT 35.4 35.0 - 47.0 %   MCV 95.9 80.0 - 100.0 fL   MCH 32.1 26.0 - 34.0 pg   MCHC 33.5 32.0 - 36.0 g/dL   RDW 19.9 (H) 11.5 - 14.5 %   Platelets 341 150 - 440 K/uL    Comment: Performed at [facility], [address] [place], [place] [address]  Magnesium     Status: Abnormal   Collection Time: 11/03/17  4:30 AM  Result Value Ref Range   Magnesium 2.5 (H) 1.7 - 2.4 mg/dL    Comment: Performed at [facility], [address] [place],  [place] [address]  Glucose, capillary     Status: Abnormal   Collection Time: 11/03/17  7:53 AM  Result Value Ref Range   Glucose-Capillary 138 (H) 65 - 99 mg/dL  Glucose, capillary     Status: None   Collection Time: 11/04/17  7:33 AM  Result Value Ref Range   Glucose-Capillary 91 65 - 99 mg/dL    Blood Alcohol level:  Lab Results  Component Value Date   ETH <10 68/05/7516    Metabolic Disorder Labs: No results found for: HGBA1C, MPG No results found for: PROLACTIN Lab Results  Component Value Date   CHOL 218 (H) 08/12/2015   TRIG 106 08/12/2015   HDL 69 08/12/2015   CHOLHDL 3.2 08/12/2015   VLDL 21 08/12/2015   LDLCALC 128 (H) 08/12/2015  LDLCALC 197 (H) 10/31/2013    Physical Findings: AIMS:  , ,  ,  ,    CIWA:  CIWA-Ar Total: 0 COWS:     Musculoskeletal: Strength & Muscle Tone: within normal limits Gait & Station: normal Patient leans: N/A  Psychiatric Specialty Exam: Physical Exam  Nursing note and vitals reviewed. Psychiatric: She has a normal mood and affect. Her speech is normal and behavior is normal. Judgment and thought content normal. Cognition and memory are normal.    Review of Systems  Neurological: Negative.   Psychiatric/Behavioral: Negative.   All other systems reviewed and are negative.   Blood pressure (!) 149/92, pulse 78, temperature (!) 97.4 F (36.3 C), temperature source Axillary, resp. rate 16, height '5\' 2"'$  (1.575 m), weight 52.3 kg (115 lb 3.2 oz), SpO2 96 %.Body mass index is 21.07 kg/m.  General Appearance: Casual  Eye Contact:  Good  Speech:  Clear and Coherent  Volume:  Normal  Mood:  Euthymic  Affect:  Appropriate  Thought Process:  Goal Directed and Descriptions of Associations: Intact  Orientation:  Full (Time, Place, and Person)  Thought Content:  WDL  Suicidal Thoughts:  No  Homicidal Thoughts:  No  Memory:  Immediate;   Fair Recent;   Fair Remote;   Fair  Judgement:  Impaired  Insight:  Shallow  Psychomotor Activity:   Normal  Concentration:  Concentration: Fair and Attention Span: Fair  Recall:  [employer] of Knowledge:  Fair  Language:  Fair  Akathisia:  No  Handed:  Right  AIMS (if indicated):     Assets:  Communication Skills Desire for Improvement Financial Resources/Insurance Housing Physical Health Resilience Social Support  ADL's:  Intact  Cognition:  WNL  Sleep:        Treatment Plan Summary: Medication management  PLAN: 1. Patient does not meet criteria for IVC. Please discharge as appropriate.  2. Please continue current regimen until patient sees a psychiatrist. No Rx given. Risks of long term, high dose use of Xanax was discussed with the patient and family.  3. Wendy Arias is an established patient with [facility] in [place]. I made an appointment for her for 11/10/2017 at 2:45 PM. She must have her insurance card and a photo ID. They will check with her insurer and call her on Monday if problems.  If the patient unable to keep this appointment, she has to call. If she misses this appointment, she will not be able to return to [facility].  Please include this in discharge information.    Orson Slick, MD 11/04/2017, 1:52 PM

## 2017-11-05 NOTE — Plan of Care (Signed)

## 2017-11-05 NOTE — Progress Notes (Signed)
Patient discharged with daughter, patient verbalized understanding of education. Patient discharged with no complaints.

## 2017-11-10 DIAGNOSIS — F411 Generalized anxiety disorder: Secondary | ICD-10-CM | POA: Diagnosis not present

## 2017-11-10 DIAGNOSIS — Z79899 Other long term (current) drug therapy: Secondary | ICD-10-CM | POA: Diagnosis not present

## 2017-11-10 DIAGNOSIS — F332 Major depressive disorder, recurrent severe without psychotic features: Secondary | ICD-10-CM | POA: Diagnosis not present

## 2017-11-14 ENCOUNTER — Other Ambulatory Visit: Payer: Self-pay | Admitting: Gastroenterology

## 2017-11-14 ENCOUNTER — Ambulatory Visit
Admission: RE | Admit: 2017-11-14 | Discharge: 2017-11-14 | Disposition: A | Discharge status: Home / Self Care | Payer: PPO | Source: Ambulatory Visit | Attending: Gastroenterology | Admitting: Gastroenterology

## 2017-11-14 DIAGNOSIS — R140 Abdominal distension (gaseous): Secondary | ICD-10-CM | POA: Diagnosis not present

## 2017-11-14 DIAGNOSIS — R1084 Generalized abdominal pain: Secondary | ICD-10-CM

## 2017-11-14 DIAGNOSIS — R634 Abnormal weight loss: Secondary | ICD-10-CM | POA: Diagnosis not present

## 2017-11-14 DIAGNOSIS — R109 Unspecified abdominal pain: Secondary | ICD-10-CM | POA: Diagnosis not present

## 2017-11-14 DIAGNOSIS — R918 Other nonspecific abnormal finding of lung field: Secondary | ICD-10-CM | POA: Diagnosis not present

## 2017-11-14 DIAGNOSIS — R635 Abnormal weight gain: Secondary | ICD-10-CM | POA: Diagnosis not present

## 2017-11-14 MED ORDER — IOPAMIDOL (ISOVUE-300) INJECTION 61%
75.0000 mL | Freq: Once | INTRAVENOUS | Status: AC | PRN
  Administered 2017-11-14: 75 mL via INTRAVENOUS

## 2017-11-17 ENCOUNTER — Ambulatory Visit
Admission: RE | Admit: 2017-11-17 | Discharge: 2017-11-17 | Disposition: A | Discharge status: Home / Self Care | Payer: PPO | Source: Ambulatory Visit | Attending: Gastroenterology | Admitting: Gastroenterology

## 2017-11-17 DIAGNOSIS — R918 Other nonspecific abnormal finding of lung field: Secondary | ICD-10-CM | POA: Diagnosis not present

## 2017-11-17 DIAGNOSIS — R590 Localized enlarged lymph nodes: Secondary | ICD-10-CM | POA: Insufficient documentation

## 2017-11-17 DIAGNOSIS — R911 Solitary pulmonary nodule: Secondary | ICD-10-CM | POA: Insufficient documentation

## 2017-11-17 DIAGNOSIS — J439 Emphysema, unspecified: Secondary | ICD-10-CM | POA: Diagnosis not present

## 2017-11-17 DIAGNOSIS — R1084 Generalized abdominal pain: Secondary | ICD-10-CM | POA: Diagnosis present

## 2017-11-17 DIAGNOSIS — I700 Atherosclerosis of aorta: Secondary | ICD-10-CM | POA: Diagnosis not present

## 2017-11-17 MED ORDER — IOPAMIDOL (ISOVUE-300) INJECTION 61%
50.0000 mL | Freq: Once | INTRAVENOUS | Status: AC | PRN
  Administered 2017-11-17: 50 mL via INTRAVENOUS

## 2017-11-18 ENCOUNTER — Encounter: Payer: Self-pay | Admitting: *Deleted

## 2017-11-18 DIAGNOSIS — R918 Other nonspecific abnormal finding of lung field: Secondary | ICD-10-CM

## 2017-11-18 NOTE — Progress Notes (Signed)
  Oncology Nurse Navigator Documentation  Navigator Location: [facility] (11/18/17 1100) Referral date to RadOnc/MedOnc: 11/18/17 (11/18/17 1100) )Navigator Encounter Type: Introductory phone call (11/18/17 1100)   Abnormal Finding Date: 11/17/17 (11/18/17 1100)                     Barriers/Navigation Needs: Coordination of Care (11/18/17 1100)   Interventions: Coordination of Care (11/18/17 1100)   Coordination of Care: Appts;Radiology (11/18/17 1100)        Acuity: Level 2 (11/18/17 1100)   Acuity Level 2: Initial guidance, education and coordination as needed;Educational needs;Assistance expediting appointments (11/18/17 1100)  phone call made to patient to introduce to navigator services and give appt info for scheduled appts on Tues 5/28. Reviewed with pt PET scan scheduled on 5/28 at 9:30am. Instructed to arrive at 9am at the medical mall to register. Instructions given to remain NPO 6 hours before, may take meds with sip of water if can tolerate on empty stomach. Advised to not eat or chew gum. Pt informed that after PET scan to come to the cancer center to meet with Dr. Mike Gip at 11:45am to review results from PET scan if available. Contact info given and instructed pt to call with any further questions or needs. Pt verbalized understanding. Nothing further needed at this time.    Time Spent with Patient: 30 (11/18/17 1100)

## 2017-11-22 ENCOUNTER — Ambulatory Visit (INDEPENDENT_AMBULATORY_CARE_PROVIDER_SITE_OTHER): Payer: PPO | Admitting: Internal Medicine

## 2017-11-22 ENCOUNTER — Encounter: Payer: Self-pay | Admitting: Hematology and Oncology

## 2017-11-22 ENCOUNTER — Inpatient Hospital Stay: Payer: PPO

## 2017-11-22 ENCOUNTER — Encounter: Payer: Self-pay | Admitting: Internal Medicine

## 2017-11-22 ENCOUNTER — Other Ambulatory Visit: Payer: Self-pay

## 2017-11-22 ENCOUNTER — Encounter: Payer: Self-pay | Admitting: *Deleted

## 2017-11-22 ENCOUNTER — Inpatient Hospital Stay: Payer: PPO | Attending: Hematology and Oncology | Admitting: Hematology and Oncology

## 2017-11-22 ENCOUNTER — Encounter
Admission: RE | Admit: 2017-11-22 | Discharge: 2017-11-22 | Disposition: A | Discharge status: Home / Self Care | Payer: PPO | Source: Ambulatory Visit | Attending: Hematology and Oncology | Admitting: Hematology and Oncology

## 2017-11-22 VITALS — BP 110/69 | HR 64 | Temp 97.6°F | Resp 18 | Ht 62.0 in | Wt 118.3 lb

## 2017-11-22 VITALS — BP 90/54 | HR 83 | Resp 16 | Ht 62.0 in | Wt 118.0 lb

## 2017-11-22 DIAGNOSIS — R918 Other nonspecific abnormal finding of lung field: Secondary | ICD-10-CM | POA: Insufficient documentation

## 2017-11-22 DIAGNOSIS — F17210 Nicotine dependence, cigarettes, uncomplicated: Secondary | ICD-10-CM | POA: Diagnosis not present

## 2017-11-22 DIAGNOSIS — Z79899 Other long term (current) drug therapy: Secondary | ICD-10-CM | POA: Insufficient documentation

## 2017-11-22 DIAGNOSIS — I10 Essential (primary) hypertension: Secondary | ICD-10-CM | POA: Insufficient documentation

## 2017-11-22 DIAGNOSIS — J439 Emphysema, unspecified: Secondary | ICD-10-CM | POA: Diagnosis not present

## 2017-11-22 DIAGNOSIS — R590 Localized enlarged lymph nodes: Secondary | ICD-10-CM | POA: Insufficient documentation

## 2017-11-22 DIAGNOSIS — D126 Benign neoplasm of colon, unspecified: Secondary | ICD-10-CM | POA: Diagnosis not present

## 2017-11-22 DIAGNOSIS — Z720 Tobacco use: Secondary | ICD-10-CM

## 2017-11-22 DIAGNOSIS — Z7982 Long term (current) use of aspirin: Secondary | ICD-10-CM | POA: Diagnosis not present

## 2017-11-22 DIAGNOSIS — M25511 Pain in right shoulder: Secondary | ICD-10-CM | POA: Insufficient documentation

## 2017-11-22 DIAGNOSIS — F329 Major depressive disorder, single episode, unspecified: Secondary | ICD-10-CM | POA: Diagnosis not present

## 2017-11-22 LAB — PROTIME-INR
INR: 1.11
Prothrombin Time: 14.2 seconds (ref 11.4–15.2)

## 2017-11-22 LAB — AMMONIA: AMMONIA: 13 umol/L (ref 9–35)

## 2017-11-22 LAB — GLUCOSE, CAPILLARY: Glucose-Capillary: 84 mg/dL (ref 65–99)

## 2017-11-22 MED ORDER — FLUDEOXYGLUCOSE F - 18 (FDG) INJECTION
6.4500 | Freq: Once | INTRAVENOUS | Status: AC | PRN
  Administered 2017-11-22: 6.45 via INTRAVENOUS

## 2017-11-22 MED ORDER — HYDROCODONE-ACETAMINOPHEN 5-325 MG PO TABS: 1.0000 | ORAL_TABLET | Freq: Four times a day (QID) | ORAL | 0 refills | Status: AC | PRN

## 2017-11-22 MED ORDER — BENZONATATE 100 MG PO CAPS: 200.0000 mg | ORAL_CAPSULE | Freq: Three times a day (TID) | ORAL | 2 refills | Status: DC

## 2017-11-22 MED ORDER — TIOTROPIUM BROMIDE MONOHYDRATE 18 MCG IN CAPS: 18.0000 ug | ORAL_CAPSULE | Freq: Every day | RESPIRATORY_TRACT | 12 refills | Status: DC

## 2017-11-22 NOTE — Patient Instructions (Signed)
Will arrange for bronchoscopy.

## 2017-11-22 NOTE — Progress Notes (Signed)
[facility] Consultation      Assessment and Plan:  Lung mass with mediastinal lymphadenopathy. -Discussed today with patient and sisters.  We will plan for EBUS bronchoscopy with EBUS guided lymph node sampling of the right paratracheal and subcarinal nodes, as well as transbronchial fluoroscopic guided biopsies of the right lower lobe mass.  Dyspnea on exertion/cough. - Suspect these are secondary to underlying lung mass.  However may also be secondary to COPD/emphysema, also noted on CT chest. - Start Spiriva. -Start Tessalon.  Nicotine abuse. - Discussed the importance of smoke cessation, spent 3 minutes of discussion.  Does not appear that the patient is ready to quit at this time.  Orders Placed This Encounter  Procedures  . DG C-Arm 1-60 Min-No Report   Meds ordered this encounter  Medications  . tiotropium (SPIRIVA) 18 MCG inhalation capsule    Sig: Place 1 capsule (18 mcg total) into inhaler and inhale daily.    Dispense:  30 capsule    Refill:  12  . benzonatate (TESSALON PERLES) 100 MG capsule    Sig: Take 2 capsules (200 mg total) by mouth 3 (three) times daily.    Dispense:  90 capsule    Refill:  2   Return 1 week after bronchoscopy.   Date: 11/22/2017  MRN# [id number] ABRA LINGENFELTER 1951-02-12  Referring Physician: Dr. Allegra Grana is a 67 y.o. old female seen in consultation for chief complaint of:    Chief Complaint  Patient presents with  . Consult    Right lung mass; poss bronch  . Shortness of Breath    x 2 weeks increased    HPI:  The patient is a 67 year old female, she was admitted to the hospital on 11/02/2017 for 2 days for acute encephalopathy, possibly related to benzodiazepine and/or alcohol withdrawal.  Her mental status improved after being placed on CIWA protocol as well as her baseline Xanax and BuSpar.  Subsequently she was seen by gastroenterology because of abdominal cramping.  She was sent for CT abdomen  pelvis and chest on 11/17/2017, this showed opacities in the right lower lungs.  She was then referred to oncology and pulmonary. She is present today with her sisters. She has been having bad cough and shortness of breath for several week. She also notes that she has pain in her right arm and right neck. She is a smoker of less than 1 ppd. She is not yet ready to quit "I am working on thinking about it". 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 She is an occasional drinker.  She has gained 20 pounds in past few months.  She has never been diagnosed with cancer.   Imaging personally reviewed, CT chest 11/17/2017; right lower lobe opacity, as well as in the base of the right middle lobe, there is also lymphadenopathy at the right paratracheal and subcarinal areas.   PMHX:   Past Medical History:  Diagnosis Date  . Depression   . Hypertension   . Osteoporosis    Surgical Hx:  Past Surgical History:  Procedure Laterality Date  . ABDOMINAL HYSTERECTOMY     partial  . CARDIAC SURGERY    . COLONOSCOPY WITH PROPOFOL N/A 04/12/2017   Procedure: COLONOSCOPY WITH PROPOFOL;  Surgeon: Lollie Sails, MD;  Location: [facility] ENDOSCOPY;  Service: Endoscopy;  Laterality: N/A;  . REDUCTION MAMMAPLASTY     Family Hx:  Family History  Problem Relation Age  of Onset  . Breast cancer Mother 34  . Hypertension Mother   . Dementia Mother   . Breast cancer Maternal Aunt 70  . Breast cancer Other 35  . Hypertension Father   . Heart disease Father   . Diabetes Father   . Diabetes Sister    Social Hx:   Social History   Tobacco Use  . Smoking status: Current Some Day Smoker    Packs/day: 0.50    Years: 20.00    Pack years: 10.00  . Smokeless tobacco: Never Used  Substance Use Topics  . Alcohol use: Yes    Comment: rarely  . Drug use: Never   Medication:    Current Outpatient Medications:  .  alprazolam (XANAX) 2 MG tablet, Take 1 mg by mouth 4 (four) times daily., Disp:  , Rfl: 2 .  aspirin 325 MG tablet, Take 325 mg by mouth daily., Disp: , Rfl:  .  busPIRone (BUSPAR) 10 MG tablet, Take 10 mg by mouth 2 (two) times daily., Disp: , Rfl:  .  citalopram (CELEXA) 20 MG tablet, Take 20 mg by mouth daily., Disp: , Rfl:  .  dicyclomine (BENTYL) 10 MG capsule, Take 10 mg by mouth every 6 (six) hours as needed for spasms., Disp: , Rfl:  .  [START ON 11/23/2017] HYDROcodone-acetaminophen (NORCO/VICODIN) 5-325 MG tablet, Take 1 tablet by mouth every 6 (six) hours as needed for up to 5 days., Disp: 20 tablet, Rfl: 0 .  hydrOXYzine (ATARAX/VISTARIL) 25 MG tablet, , Disp: , Rfl: 0 .  ibandronate (BONIVA) 150 MG tablet, Take 150 mg by mouth every 30 (thirty) days. Take in the morning with a full glass of water, on an empty stomach, and do not take anything else by mouth or lie down for the next 30 min., Disp: , Rfl:  .  metoprolol succinate (TOPROL-XL) 50 MG 24 hr tablet, Take 50 mg by mouth daily. Take with or immediately following a meal., Disp: , Rfl:  .  simvastatin (ZOCOR) 40 MG tablet, Take 40 mg by mouth daily., Disp: , Rfl:  .  venlafaxine XR (EFFEXOR-XR) 75 MG 24 hr capsule, Take 75 mg by mouth daily with breakfast. , Disp: , Rfl:  .  vitamin B-12 (CYANOCOBALAMIN) 1000 MCG tablet, Take 1,000 mcg by mouth daily., Disp: , Rfl:  .  zolpidem (AMBIEN) 10 MG tablet, Take 5-10 mg by mouth at bedtime and may repeat dose one time if needed. 10mg  at bedtime, may repeat 5mg  if needed, Disp: , Rfl: 2   Allergies:  Penicillins  Review of Systems: Gen:  Denies  fever, sweats, chills HEENT: Denies blurred vision, double vision. bleeds, sore throat Cvc:  No dizziness, chest pain. Resp:   Denies cough or sputum production. Gi: Denies swallowing difficulty, stomach pain. Gu:  Denies bladder incontinence, burning urine Ext:   No Joint pain, stiffness. Skin: No skin rash,  hives  Endoc:  No polyuria, polydipsia. Psych: No depression, insomnia. Other:  All other systems were  reviewed with the patient and were negative other that what is mentioned in the HPI.   Physical Examination:   VS: BP (!) 90/54 (BP Location: Left Arm, Cuff Size: Normal)   Pulse 83   Resp 16   Ht 5\' 2"  (1.575 m)   Wt 118 lb (53.5 kg)   SpO2 92%   BMI 21.58 kg/m   General Appearance: No distress  Neuro:without focal findings,  speech normal,  HEENT: PERRLA, EOM intact.   Pulmonary: normal  breath sounds, No wheezing.  CardiovascularNormal S1,S2.  No m/r/g.   Abdomen: Benign, Soft, non-tender. Renal:  No costovertebral tenderness  GU:  No performed at this time. Endoc: No evident thyromegaly, no signs of acromegaly. Skin:   warm, no rashes, no ecchymosis  Extremities: normal, no cyanosis, clubbing.  Other findings:    LABORATORY PANEL:   CBC No results for input(s): WBC, HGB, HCT, PLT in the last 168 hours. ------------------------------------------------------------------------------------------------------------------  Chemistries  No results for input(s): NA, K, CL, CO2, GLUCOSE, BUN, CREATININE, CALCIUM, MG, AST, ALT, ALKPHOS, BILITOT in the last 168 hours.  Invalid input(s): GFRCGP ------------------------------------------------------------------------------------------------------------------  Cardiac Enzymes No results for input(s): TROPONINI in the last 168 hours. ------------------------------------------------------------  RADIOLOGY:  Nm Pet Image Initial (pi) Skull Base To Thigh  Result Date: 11/22/2017 CLINICAL DATA:  Initial treatment strategy for right middle lobe and right lower lobe masslike opacities. EXAM: NUCLEAR MEDICINE PET SKULL BASE TO THIGH TECHNIQUE: 6.5 mCi F-18 FDG was injected intravenously. Full-ring PET imaging was performed from the skull base to thigh after the radiotracer. CT data was obtained and used for attenuation correction and anatomic localization. Fasting blood glucose: 84 mg/dl COMPARISON:  11/17/2017 chest CT. FINDINGS:  Mediastinal blood pool activity: SUV max 2.5 NECK: No hypermetabolic lymph nodes in the neck. Incidental CT findings: none CHEST: Hypermetabolic masslike consolidation in medial right middle lobe measuring 4.6 x 4.0 cm with max SUV 8.8 (series 4/image 105), not appreciably changed since 11/17/2017 chest CT. Hypermetabolic masslike consolidation in the anterior right lower lobe measuring 6.4 x 4.8 cm with max SUV 7.7 (series 4/image 105), not appreciably changed since 11/17/2017 chest CT. Hypermetabolic irregular nodular focus of consolidation in the peripheral right middle lobe with max SUV 3.3 (series 4/image 93), not appreciably changed since 11/17/2017 chest CT. Hypermetabolic right hilar adenopathy with max SUV 3.7. Hypermetabolic enlarged 1.3 cm subcarinal node with max SUV 4.5 (series 4/image 79). Hypermetabolic enlarged 1.6 cm right paratracheal node with max SUV 4.3 (series 4/image 74). No hypermetabolic left mediastinal or left hilar nodes. No hypermetabolic axillary adenopathy. Incidental CT findings: Trace dependent right pleural effusion. Atherosclerotic nonaneurysmal thoracic aorta. ABDOMEN/PELVIS: No abnormal hypermetabolic activity within the liver, pancreas, adrenal glands, or spleen. No hypermetabolic lymph nodes in the abdomen or pelvis. Incidental CT findings: Simple 3.6 cm lateral segment left liver lobe cyst. Mild sigmoid diverticulosis. Atherosclerotic abdominal aorta with ectatic 2.5 cm infrarenal abdominal aorta. SKELETON: No focal hypermetabolic activity to suggest skeletal metastasis. Incidental CT findings: none IMPRESSION: 1. Large hypermetabolic masslike foci of consolidation in the medial right middle lobe and anterior right lower lobe. Hypermetabolic irregular nodular focus of consolidation in the peripheral right middle lobe. These findings are not appreciably changed since the recent 11/17/2017 chest CT study and are nonspecific. Primary bronchogenic malignancy is certainly of  concern, although a multilobar pneumonia cannot be excluded. Tissue sampling may be obtained at this time versus short-term follow-up post treatment chest CT with IV contrast after a trial of antibiotic therapy, depending on level of clinical concern for malignancy. 2. Hypermetabolic right hilar, subcarinal and right paratracheal adenopathy, nonspecific, metastatic disease not excluded. 3. No extrathoracic or skeletal hypermetabolic disease. 4. Trace dependent right pleural effusion. 5.  Aortic Atherosclerosis (ICD10-I70.0). Electronically Signed   By: Ilona Sorrel M.D.   On: 11/22/2017 12:04       Thank  you for the consultation and for allowing [facility] to assist in the care of your patient. Our recommendations are noted above.  Please contact us if we can be of further service.   Marda Stalker, MD.  Board Certified in Internal Medicine, Pulmonary Medicine, [place], and Sleep Medicine.  [facility] Office Number: [phone]  Patricia Pesa, M.D.  Merton Border, M.D  11/22/2017

## 2017-11-22 NOTE — Progress Notes (Signed)
[name] day:  11/22/2017  Chief Complaint: Wendy Arias is a 67 y.o. female with an abnormal chest CT who is referred in consultation by Dr Laurine Blazer, PA for assessment and management.  HPI:  She was admitted to [facility] from 11/02/2017 - 11/14/2017 with encephalopathy.  She presented with altered mental status/confusion secondary to a combination of alcohol and benzodiazepine withdrawal.  She apparently takes high doses of Xanax and also BuSpar and had stopped them recently as she ran out of her medications.  Neurology and psychiatry were consulted.  EEG revealed no seizure.  Head CT and head MRI were negative.  After being placed back on her Xanax, BuSpar and being placed on CIWA protocol, her mental status significantly improved and is back to baseline.  She was seen by Laurine Blazer on 11/14/2017 for follow-up.  She complained of abdominal bloating and progressive distention over about a 6 week period. She had early satiety, but 15 pound weight gain.  She described abdominal cramping.  Abdominal pain radiated to her right shoulder.  Lying flat caused shortness of breath.  Abdomen and pelvic CT on 11/17/2017 revealed no acute intra-abdominal or intra-pelvic abnormalities.  There were 2 large right lung base opacities, incompletely visualized.  Chest CT was recommended.  Chest CT on 11/17/2017 revealed a necrotic 5.4 x 4.2 cm RLL mass and a 4.4 x 4.1 cm RML mass.  Biopsy and PET-CT were felt to be helpful.  There was mediastinal and right hilar adenopathy.  There was a 8 mm subpleural RLL pulmonary nodule.  There was a small left supraclvicular node.  There was no definite osseous metastasis. There was emphysema.  Colonoscopy on 04/12/2017 revealed a tubular adenoma and hyperplastic colon polyp.  Follow-up was recommended in 3 years.  Symptomatically, she has not felt well over the course of the last couple of months. She denies fevers and sweats.  Patient has had a productive cough. She denies hemoptysis.  Patient describes diarrhea for over a year. Patient is more confused as of late per her sisters. She has been recently admitted to the hospital for altered mental status, due to BZO withdrawal, and depression. Patient was referred to psychiatry for further evaluation of her depression.   Patient has pain in her RIGHT shoulder, neck, and supraclavicular area. She noted that palpation of her shoulder causes her to have pain in her RIGHT ear. Short term memory recall was poor. She experienced frustration with 5 minute recall of 3 simple words (blue, seven, and different). Patient only able to recall the word "blue".    Maternal aunt had breast cancer (age 37).  Niece has breast and skin cancer (age 71).    Past Medical History:  Diagnosis Date  . Depression   . Hypertension   . Osteoporosis     Past Surgical History:  Procedure Laterality Date  . ABDOMINAL HYSTERECTOMY     partial  . CARDIAC SURGERY    . COLONOSCOPY WITH PROPOFOL N/A 04/12/2017   Procedure: COLONOSCOPY WITH PROPOFOL;  Surgeon: Lollie Sails, MD;  Location: [facility] ENDOSCOPY;  Service: Endoscopy;  Laterality: N/A;  . REDUCTION MAMMAPLASTY      Family History  Problem Relation Age of Onset  . Breast cancer Mother 18  . Hypertension Mother   . Dementia Mother   . Breast cancer Maternal Aunt 70  . Breast cancer Other 35  . Hypertension Father   . Heart disease Father   . Diabetes  Father   . Diabetes Sister     Social History:  reports that she has been smoking.  She has never used smokeless tobacco. She reports that she drinks alcohol. She reports that she does not use drugs.  She smokes 1-2 packs every 2 days. She has smoked for approximately 20 years. Patient denies known exposures to radiation on toxins.  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  She previously lived in [place].  She moved to [place] 20 years ago.  She lives in  [place].  The patient is accompanied by her sisters, [name] and [name], today. [name] (nurse navigator) is also present.   Allergies:  Allergies  Allergen Reactions  . Penicillins     Has patient had a PCN reaction causing immediate rash, facial/tongue/throat swelling, SOB or lightheadedness with hypotension: Yes Has patient had a PCN reaction causing severe rash involving mucus membranes or skin necrosis: Unknown Has patient had a PCN reaction that required hospitalization: Unknown Has patient had a PCN reaction occurring within the last 10 years: Unknown If all of the above answers are "NO", then may proceed with Cephalosporin use.    Current Medications: Current Outpatient Medications  Medication Sig Dispense Refill  . alprazolam (XANAX) 2 MG tablet Take 1 mg by mouth 4 (four) times daily.  2  . aspirin 325 MG tablet Take 325 mg by mouth daily.    . busPIRone (BUSPAR) 10 MG tablet Take 10 mg by mouth 2 (two) times daily.    . citalopram (CELEXA) 20 MG tablet Take 20 mg by mouth daily.    Marland Kitchen dicyclomine (BENTYL) 10 MG capsule Take 10 mg by mouth every 6 (six) hours as needed for spasms.    Marland Kitchen HYDROcodone-acetaminophen (NORCO/VICODIN) 5-325 MG tablet Take 1 tablet by mouth every 6 (six) hours as needed.     . hydrOXYzine (ATARAX/VISTARIL) 25 MG tablet   0  . ibandronate (BONIVA) 150 MG tablet Take 150 mg by mouth every 30 (thirty) days. Take in the morning with a full glass of water, on an empty stomach, and do not take anything else by mouth or lie down for the next 30 min.    . metoprolol succinate (TOPROL-XL) 50 MG 24 hr tablet Take 50 mg by mouth daily. Take with or immediately following a meal.    . simvastatin (ZOCOR) 40 MG tablet Take 40 mg by mouth daily.    Marland Kitchen venlafaxine XR (EFFEXOR-XR) 75 MG 24 hr capsule Take 75 mg by mouth daily with breakfast.     . vitamin B-12 (CYANOCOBALAMIN) 1000 MCG tablet Take 1,000 mcg by mouth daily.    Marland Kitchen zolpidem (AMBIEN) 10 MG tablet Take 5-10  mg by mouth at bedtime and may repeat dose one time if needed. 10mg  at bedtime, may repeat 5mg  if needed  2   No current facility-administered medications for this visit.     Review of Systems  Constitutional: Positive for malaise/fatigue and weight loss (15-20 pound weight loss; appetite "ok"). Negative for diaphoresis and fever.       Hasn't felt good for 2 months.  HENT: Positive for ear pain (with palpation/manipulation of RIGHT shoulder). Negative for nosebleeds and sore throat.   Eyes: Negative.  Negative for blurred vision, double vision, pain and redness.  Respiratory: Positive for cough and shortness of breath (exertional). Negative for hemoptysis and sputum production.   Cardiovascular: Negative.  Negative for chest pain, palpitations, orthopnea, leg swelling and PND.  Gastrointestinal: Positive for  diarrhea. Negative for abdominal pain, blood in stool, constipation, melena, nausea and vomiting.       Not eating a lot.  Bloating and distention  Genitourinary: Negative for dysuria, frequency, hematuria and urgency.  Musculoskeletal: Positive for joint pain (RIGHT shoulder and neck) and myalgias. Negative for back pain and falls.  Skin: Negative for itching and rash.  Neurological: Negative for dizziness, tremors, weakness and headaches.  Endo/Heme/Allergies: Does not bruise/bleed easily.  Psychiatric/Behavioral: Positive for depression and memory loss. Negative for suicidal ideas. The patient is not nervous/anxious and does not have insomnia.   All other systems reviewed and are negative.  Performance status (ECOG): 1 - Symptomatic but completely ambulatory  Physical Exam: Blood pressure 110/69, pulse 64, temperature 97.6 F (36.4 C), temperature source Tympanic, resp. rate 18, height 5\' 2"  (1.575 m), weight 118 lb 4.8 oz (53.7 kg), SpO2 92 %. GENERAL:  Fatigued appearing woman sitting comfortably in the exam room in no acute distress. MENTAL STATUS:  Alert and oriented to  person, place and time. HEAD:  Long straight brown hair.  Normocephalic, atraumatic, face symmetric, no Cushingoid features. EYES:  Glasses. Green eyes.  Pupils equal round and reactive to light and accomodation.  No conjunctivitis or scleral icterus. ENT:  Oropharynx clear without lesion.  Tongue normal. Mucous membranes moist.  RESPIRATORY:  Clear to auscultation without rales, wheezes or rhonchi. CARDIOVASCULAR:  Regular rate and rhythm without murmur, rub or gallop. ABDOMEN:  Upper and peri-umbilical abdominal tenderness. Firm. Bowel sound active in all quadrants. No hepatosplenomegaly.  No masses. SKIN:  No rashes, ulcers or lesions. EXTREMITIES: No edema, no skin discoloration or tenderness.  No palpable cords. LYMPH NODES: No palpable cervical, supraclavicular, axillary or inguinal adenopathy  NEUROLOGICAL: Alert & oriented, cranial nerves II-XII intact; motor strength 5/5 throughout; sensation intact; finger to nose and RAM normal somewhat difficult; unable to walk heel to toe; negative Rhomberg; no clonus or Babinski.  Difficulty following multi-step commands. PSYCH:  Appropriate.   Hospital Outpatient Visit on 11/22/2017  Component Date Value Ref Range Status  . Glucose-Capillary 11/22/2017 84  65 - 99 mg/dL Final    Assessment:  Wendy Arias is a 67 y.o. female with imaging studies worrisome for probable advanced lung cancer.  She presented with abdominal pain.  Chest CT on 11/17/2017 revealed a necrotic 5.4 x 4.2 cm RLL mass and a 4.4 x 4.1 cm RML mass.  There was mediastinal and right hilar adenopathy.  There was a 8 mm subpleural RLL pulmonary nodule.  There was a small left supraclvicular node.  There was no definite osseous metastasis. There was emphysema.  Clinical stage T4N2.  Head CT without contrast was negative on 11/02/2017.  Head MRI without contrast was negative on 11/03/2017.  CEA is 2.4.  Colonoscopy on 04/12/2017 revealed a tubular adenoma and hyperplastic colon  polyp.  Symptomatically, she has not felt good x 2 months.  She has pain in her RIGHT shoulder, neck, and supraclavicular area.  Memory is poor and multi-step commands are difficult.  Neurologic exam reveals difficulty with RAM and heel to toe walking.    Plan: 1. Labs today: CEA, ammonia, INR. 2. Review CT and PET imaging. Imagining demonstrates hypermetabolic lesions in the RIGHT lung (max SUV 8.8), as well as hypermetabolic RIGHT hilar, subcarinal, and paratracheal adenopathy (max SUV 4.5). Discuss need for biopsy for diagnosis and staging purposes. Patient in agreement.  3. Refer to pulmonary medicine. Patient needs a bronchoscopy. Patient able to be seen today at  1500 by Dr. Ashby Dawes.  4. Discuss patient at multidisciplinary tumor board on 11/24/2017. 5. Schedule bone scan to assess for osseous metastasis.  6. Discuss confusion. Patient had a motion degraded brained MRI on 11/03/2017. Discussed need to repeat imaging with contrast to rule out brain metastasis.  7. Discuss pain control. Patient having significant pain in her RIGHT shoulder and neck.  Abdomen is tender. She has been taking Norco 5/325 mg q6 PRN, however she is out. Given a current oncological diagnosis, this patient has the potential to experience significant cancer related pain. Discussed short term refill. Pain 8/10 in clinic today. [place] CSRS checked prior to prescribing (ORS 530). Benefits versus risks associated with continued therapy considered. Will continue short term pain management with opioids as previously prescribed. Patient educated that medications should not be bitten, chewed, or crushed. Additionally, safety precautions reviewed as she is on concurrent therapy with anxiolytics and sedatives. Reviewed risk for CNS depression. Patient verbalized understanding that medications should not be sold or shared, taken with alcohol, or used while driving. Refill prescription sent in for: Norco 5/325 mg q6h PRN (Disp #20).   8. RTC after bronchoscopy for MD assessment and further discussions regarding the direction of therapy.     Honor Loh, NP  11/22/2017, 12:44 PM   I saw and evaluated the patient, participating in the key portions of the service and reviewing pertinent diagnostic studies and records.  I reviewed the nurse practitioner's note and agree with the findings and the plan.  The assessment and plan were discussed with the patient.  Additional diagnostic studies of bone scan are needed to clarify potential metastatic disease given her bone pain and would change the clinical management.  Multiple questions were asked by the patient and answered.   Nolon Stalls, MD 11/22/2017, 12:44 PM

## 2017-11-22 NOTE — Progress Notes (Signed)
Patient here for initial evaluation. O2 sat 91-92% on room air at rest, voices shortness of breath with exertion.

## 2017-11-22 NOTE — H&P (View-Only) (Signed)
[facility] Consultation      Assessment and Plan:  Lung mass with mediastinal lymphadenopathy. -Discussed today with patient and sisters.  We will plan for EBUS bronchoscopy with EBUS guided lymph node sampling of the right paratracheal and subcarinal nodes, as well as transbronchial fluoroscopic guided biopsies of the right lower lobe mass.  Dyspnea on exertion/cough. - Suspect these are secondary to underlying lung mass.  However may also be secondary to COPD/emphysema, also noted on CT chest. - Start Spiriva. -Start Tessalon.  Nicotine abuse. - Discussed the importance of smoke cessation, spent 3 minutes of discussion.  Does not appear that the patient is ready to quit at this time.  Orders Placed This Encounter  Procedures  . DG C-Arm 1-60 Min-No Report   Meds ordered this encounter  Medications  . tiotropium (SPIRIVA) 18 MCG inhalation capsule    Sig: Place 1 capsule (18 mcg total) into inhaler and inhale daily.    Dispense:  30 capsule    Refill:  12  . benzonatate (TESSALON PERLES) 100 MG capsule    Sig: Take 2 capsules (200 mg total) by mouth 3 (three) times daily.    Dispense:  90 capsule    Refill:  2   Return 1 week after bronchoscopy.   Date: 11/22/2017  MRN# [id number] LENITA PEREGRINA [birth year]  Referring Physician: Dr. Allegra Grana is a 67 y.o. old female seen in consultation for chief complaint of:    Chief Complaint  Patient presents with  . Consult    Right lung mass; poss bronch  . Shortness of Breath    x 2 weeks increased    HPI:  The patient is a 67 year old female, she was admitted to the hospital on 11/02/2017 for 2 days for acute encephalopathy, possibly related to benzodiazepine and/or alcohol withdrawal.  Her mental status improved after being placed on CIWA protocol as well as her baseline Xanax and BuSpar.  Subsequently she was seen by gastroenterology because of abdominal cramping.  She was sent for CT abdomen  pelvis and chest on 11/17/2017, this showed opacities in the right lower lungs.  She was then referred to oncology and pulmonary. She is present today with her sisters. She has been having bad cough and shortness of breath for several week. She also notes that she has pain in her right arm and right neck. She is a smoker of less than 1 ppd. She is not yet ready to quit "I am working on thinking about it". 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 She is an occasional drinker.  She has gained 20 pounds in past few months.  She has never been diagnosed with cancer.   Imaging personally reviewed, CT chest 11/17/2017; right lower lobe opacity, as well as in the base of the right middle lobe, there is also lymphadenopathy at the right paratracheal and subcarinal areas.   PMHX:   Past Medical History:  Diagnosis Date  . Depression   . Hypertension   . Osteoporosis    Surgical Hx:  Past Surgical History:  Procedure Laterality Date  . ABDOMINAL HYSTERECTOMY     partial  . CARDIAC SURGERY    . COLONOSCOPY WITH PROPOFOL N/A 04/12/2017   Procedure: COLONOSCOPY WITH PROPOFOL;  Surgeon: Lollie Sails, MD;  Location: [facility] ENDOSCOPY;  Service: Endoscopy;  Laterality: N/A;  . REDUCTION MAMMAPLASTY     Family Hx:  Family History  Problem Relation Age  of Onset  . Breast cancer Mother 23  . Hypertension Mother   . Dementia Mother   . Breast cancer Maternal Aunt 70  . Breast cancer Other 35  . Hypertension Father   . Heart disease Father   . Diabetes Father   . Diabetes Sister    Social Hx:   Social History   Tobacco Use  . Smoking status: Current Some Day Smoker    Packs/day: 0.50    Years: 20.00    Pack years: 10.00  . Smokeless tobacco: Never Used  Substance Use Topics  . Alcohol use: Yes    Comment: rarely  . Drug use: Never   Medication:    Current Outpatient Medications:  .  alprazolam (XANAX) 2 MG tablet, Take 1 mg by mouth 4 (four) times daily., Disp:  , Rfl: 2 .  aspirin 325 MG tablet, Take 325 mg by mouth daily., Disp: , Rfl:  .  busPIRone (BUSPAR) 10 MG tablet, Take 10 mg by mouth 2 (two) times daily., Disp: , Rfl:  .  citalopram (CELEXA) 20 MG tablet, Take 20 mg by mouth daily., Disp: , Rfl:  .  dicyclomine (BENTYL) 10 MG capsule, Take 10 mg by mouth every 6 (six) hours as needed for spasms., Disp: , Rfl:  .  [START ON 11/23/2017] HYDROcodone-acetaminophen (NORCO/VICODIN) 5-325 MG tablet, Take 1 tablet by mouth every 6 (six) hours as needed for up to 5 days., Disp: 20 tablet, Rfl: 0 .  hydrOXYzine (ATARAX/VISTARIL) 25 MG tablet, , Disp: , Rfl: 0 .  ibandronate (BONIVA) 150 MG tablet, Take 150 mg by mouth every 30 (thirty) days. Take in the morning with a full glass of water, on an empty stomach, and do not take anything else by mouth or lie down for the next 30 min., Disp: , Rfl:  .  metoprolol succinate (TOPROL-XL) 50 MG 24 hr tablet, Take 50 mg by mouth daily. Take with or immediately following a meal., Disp: , Rfl:  .  simvastatin (ZOCOR) 40 MG tablet, Take 40 mg by mouth daily., Disp: , Rfl:  .  venlafaxine XR (EFFEXOR-XR) 75 MG 24 hr capsule, Take 75 mg by mouth daily with breakfast. , Disp: , Rfl:  .  vitamin B-12 (CYANOCOBALAMIN) 1000 MCG tablet, Take 1,000 mcg by mouth daily., Disp: , Rfl:  .  zolpidem (AMBIEN) 10 MG tablet, Take 5-10 mg by mouth at bedtime and may repeat dose one time if needed. 10mg  at bedtime, may repeat 5mg  if needed, Disp: , Rfl: 2   Allergies:  Penicillins  Review of Systems: Gen:  Denies  fever, sweats, chills HEENT: Denies blurred vision, double vision. bleeds, sore throat Cvc:  No dizziness, chest pain. Resp:   Denies cough or sputum production. Gi: Denies swallowing difficulty, stomach pain. Gu:  Denies bladder incontinence, burning urine Ext:   No Joint pain, stiffness. Skin: No skin rash,  hives  Endoc:  No polyuria, polydipsia. Psych: No depression, insomnia. Other:  All other systems were  reviewed with the patient and were negative other that what is mentioned in the HPI.   Physical Examination:   VS: BP (!) 90/54 (BP Location: Left Arm, Cuff Size: Normal)   Pulse 83   Resp 16   Ht 5\' 2"  (1.575 m)   Wt 118 lb (53.5 kg)   SpO2 92%   BMI 21.58 kg/m   General Appearance: No distress  Neuro:without focal findings,  speech normal,  HEENT: PERRLA, EOM intact.   Pulmonary: normal  breath sounds, No wheezing.  CardiovascularNormal S1,S2.  No m/r/g.   Abdomen: Benign, Soft, non-tender. Renal:  No costovertebral tenderness  GU:  No performed at this time. Endoc: No evident thyromegaly, no signs of acromegaly. Skin:   warm, no rashes, no ecchymosis  Extremities: normal, no cyanosis, clubbing.  Other findings:    LABORATORY PANEL:   CBC No results for input(s): WBC, HGB, HCT, PLT in the last 168 hours. ------------------------------------------------------------------------------------------------------------------  Chemistries  No results for input(s): NA, K, CL, CO2, GLUCOSE, BUN, CREATININE, CALCIUM, MG, AST, ALT, ALKPHOS, BILITOT in the last 168 hours.  Invalid input(s): GFRCGP ------------------------------------------------------------------------------------------------------------------  Cardiac Enzymes No results for input(s): TROPONINI in the last 168 hours. ------------------------------------------------------------  RADIOLOGY:  Nm Pet Image Initial (pi) Skull Base To Thigh  Result Date: 11/22/2017 CLINICAL DATA:  Initial treatment strategy for right middle lobe and right lower lobe masslike opacities. EXAM: NUCLEAR MEDICINE PET SKULL BASE TO THIGH TECHNIQUE: 6.5 mCi F-18 FDG was injected intravenously. Full-ring PET imaging was performed from the skull base to thigh after the radiotracer. CT data was obtained and used for attenuation correction and anatomic localization. Fasting blood glucose: 84 mg/dl COMPARISON:  11/17/2017 chest CT. FINDINGS:  Mediastinal blood pool activity: SUV max 2.5 NECK: No hypermetabolic lymph nodes in the neck. Incidental CT findings: none CHEST: Hypermetabolic masslike consolidation in medial right middle lobe measuring 4.6 x 4.0 cm with max SUV 8.8 (series 4/image 105), not appreciably changed since 11/17/2017 chest CT. Hypermetabolic masslike consolidation in the anterior right lower lobe measuring 6.4 x 4.8 cm with max SUV 7.7 (series 4/image 105), not appreciably changed since 11/17/2017 chest CT. Hypermetabolic irregular nodular focus of consolidation in the peripheral right middle lobe with max SUV 3.3 (series 4/image 93), not appreciably changed since 11/17/2017 chest CT. Hypermetabolic right hilar adenopathy with max SUV 3.7. Hypermetabolic enlarged 1.3 cm subcarinal node with max SUV 4.5 (series 4/image 79). Hypermetabolic enlarged 1.6 cm right paratracheal node with max SUV 4.3 (series 4/image 74). No hypermetabolic left mediastinal or left hilar nodes. No hypermetabolic axillary adenopathy. Incidental CT findings: Trace dependent right pleural effusion. Atherosclerotic nonaneurysmal thoracic aorta. ABDOMEN/PELVIS: No abnormal hypermetabolic activity within the liver, pancreas, adrenal glands, or spleen. No hypermetabolic lymph nodes in the abdomen or pelvis. Incidental CT findings: Simple 3.6 cm lateral segment left liver lobe cyst. Mild sigmoid diverticulosis. Atherosclerotic abdominal aorta with ectatic 2.5 cm infrarenal abdominal aorta. SKELETON: No focal hypermetabolic activity to suggest skeletal metastasis. Incidental CT findings: none IMPRESSION: 1. Large hypermetabolic masslike foci of consolidation in the medial right middle lobe and anterior right lower lobe. Hypermetabolic irregular nodular focus of consolidation in the peripheral right middle lobe. These findings are not appreciably changed since the recent 11/17/2017 chest CT study and are nonspecific. Primary bronchogenic malignancy is certainly of  concern, although a multilobar pneumonia cannot be excluded. Tissue sampling may be obtained at this time versus short-term follow-up post treatment chest CT with IV contrast after a trial of antibiotic therapy, depending on level of clinical concern for malignancy. 2. Hypermetabolic right hilar, subcarinal and right paratracheal adenopathy, nonspecific, metastatic disease not excluded. 3. No extrathoracic or skeletal hypermetabolic disease. 4. Trace dependent right pleural effusion. 5.  Aortic Atherosclerosis (ICD10-I70.0). Electronically Signed   By: Ilona Sorrel M.D.   On: 11/22/2017 12:04       Thank  you for the consultation and for allowing [facility] to assist in the care of your patient. Our recommendations are noted above.  Please contact us if we can be of further service.   Marda Stalker, MD.  Board Certified in Internal Medicine, Pulmonary Medicine, [place], and Sleep Medicine.  [facility] Office Number: [phone]  Patricia Pesa, M.D.  Merton Border, M.D  11/22/2017

## 2017-11-23 ENCOUNTER — Telehealth: Payer: Self-pay | Admitting: *Deleted

## 2017-11-23 LAB — CEA: CEA1: 2.4 ng/mL (ref 0.0–4.7)

## 2017-11-23 NOTE — Telephone Encounter (Signed)
Pt having EBUS on 11/28/17.CPT [id number].Marland Kitchen...[address] Dr. Ashby Dawes DX: lung mass right.

## 2017-11-23 NOTE — Progress Notes (Signed)
  Oncology Nurse Navigator Documentation  Navigator Location: [facility]-Med Onc (11/23/17 0800)   )Navigator Encounter Type: Initial MedOnc (11/23/17 0800)                       Treatment Phase: Abnormal Scans (11/23/17 0800) Barriers/Navigation Needs: Coordination of Care (11/23/17 0800)   Interventions: Coordination of Care (11/23/17 0800)   Coordination of Care: Appts;Broncoscopy (11/23/17 0800)         met with patient during initial med-onc consultation with Dr. Mike Gip. All imaging reviewed during visit by provider. All questions answered at the time of visit. Reviewed upcoming appts with patient and her two sisters present. Contact info given and encouraged to call with any further questions or needs. Pt and family verbalized understanding.         Time Spent with Patient: 60 (11/23/17 0800)

## 2017-11-23 NOTE — Telephone Encounter (Signed)
Checked [facility]TA web site and PA is not required for procedure code [phone] and [phone] as long as done outpatient. Rhonda J Cobb

## 2017-11-24 ENCOUNTER — Encounter
Admission: RE | Admit: 2017-11-24 | Discharge: 2017-11-24 | Disposition: A | Discharge status: Home / Self Care | Payer: PPO | Source: Ambulatory Visit | Attending: Internal Medicine | Admitting: Internal Medicine

## 2017-11-24 ENCOUNTER — Other Ambulatory Visit: Payer: Self-pay

## 2017-11-24 HISTORY — DX: Unspecified asthma, uncomplicated: J45.909

## 2017-11-24 HISTORY — DX: Acute myocardial infarction, unspecified: I21.9

## 2017-11-24 HISTORY — DX: Anxiety disorder, unspecified: F41.9

## 2017-11-24 NOTE — Patient Instructions (Signed)
Your procedure is scheduled on:Mon 11/28/17 Report to Day Surgery. To find out your arrival time please call [phone] between 1PM - 3PM on Friday 11/25/17.  Remember: Instructions that are not followed completely may result in serious medical risk, up to and including death, or upon the discretion of your surgeon and anesthesiologist your surgery may need to be rescheduled.     _X__ 1. Do not eat food after midnight the night before your procedure.                 No gum chewing or hard candies. You may drink clear liquids up to 2 hours                 before you are scheduled to arrive for your surgery- DO not drink clear                 liquids within 2 hours of the start of your surgery.                 Clear Liquids include:  water, apple juice without pulp, clear carbohydrate                 drink such as Clearfast of Gartorade, Black Coffee or Tea (Do not add                 anything to coffee or tea).  __X__2.  On the morning of surgery brush your teeth with toothpaste and water, you  may rinse your mouth with mouthwash if you wish.  Do not swallow any  toothpaste of mouthwash.     _X__ 3.  No Alcohol for 24 hours before or after surgery.   _X__ 4.  Do Not Smoke or use e-cigarettes For 24 Hours Prior to Your Surgery.                 Do not use any chewable tobacco products for at least 6 hours prior to                 surgery.  ____  5.  Bring all medications with you on the day of surgery if instructed.   __x__  6.  Notify your doctor if there is any change in your medical condition      (cold, fever, infections).     Do not wear jewelry, make-up, hairpins, clips or nail polish. Do not wear lotions, powders, or perfumes. You may wear deodorant. Do not shave 48 hours prior to surgery. Men may shave face and neck. Do not bring valuables to the hospital.    [facility] is not responsible for any belongings or valuables.  Contacts, dentures or  bridgework may not be worn into surgery. Leave your suitcase in the car. After surgery it may be brought to your room. For patients admitted to the hospital, discharge time is determined by your treatment team.   Patients discharged the day of surgery will not be allowed to drive home.   Please read over the following fact sheets that you were given:    __x__ Take these medicines the morning of surgery with A SIP OF WATER:    1. alprazolam (XANAX) 2 MG tablet  2. benzonatate (TESSALON PERLES) 100 MG capsule  3.busPIRone (BUSPAR) 10 MG tablet   4.citalopram (CELEXA) 20 MG tablet  5.HYDROcodone-acetaminophen (NORCO/VICODIN) 5-325 MG tablet if needed  6.metoprolol succinate (TOPROL-XL) 50 MG 24 hr tablet  7.venlafaxine XR (EFFEXOR-XR) 75 MG 24 hr capsule  ____ Fleet Enema (as directed)   ____ Use CHG Soap as directed  _x___ Use inhalers on the day of surgery tiotropium (SPIRIVA) 18 MCG inhalation capsule  ____ Stop metformin 2 days prior to surgery    ____ Take 1/2 of usual insulin dose the night before surgery. No insulin the morning          of surgery.   __x__ Stop aspirin today   __x__ Stop Anti-inflammatories today  May take up to 4000mg  of tylenol ( included 325mg  in each Norco dose in calculation)   ____ Stop supplements until after surgery.    ____ Bring C-Pap to the hospital.

## 2017-11-25 ENCOUNTER — Telehealth: Payer: Self-pay | Admitting: *Deleted

## 2017-11-25 NOTE — Telephone Encounter (Signed)
Called pt to inform of appt scheduled with Dr. Mike Gip on Friday 6/7 at 11:15am to discuss pathology results and bone scan results. Pt verbalized understanding.

## 2017-11-28 ENCOUNTER — Encounter: Payer: Self-pay | Admitting: Anesthesiology

## 2017-11-28 ENCOUNTER — Ambulatory Visit: Payer: PPO

## 2017-11-28 ENCOUNTER — Ambulatory Visit
Admission: RE | Admit: 2017-11-28 | Discharge: 2017-11-28 | Disposition: A | Discharge status: Home / Self Care | Payer: PPO | Source: Ambulatory Visit | Attending: Internal Medicine | Admitting: Internal Medicine

## 2017-11-28 ENCOUNTER — Ambulatory Visit: Payer: PPO | Admitting: Anesthesiology

## 2017-11-28 ENCOUNTER — Encounter
Admission: RE | Disposition: A | Discharge status: Home / Self Care | Payer: Self-pay | Source: Ambulatory Visit | Attending: Internal Medicine

## 2017-11-28 DIAGNOSIS — Z7982 Long term (current) use of aspirin: Secondary | ICD-10-CM | POA: Diagnosis not present

## 2017-11-28 DIAGNOSIS — R918 Other nonspecific abnormal finding of lung field: Secondary | ICD-10-CM | POA: Insufficient documentation

## 2017-11-28 DIAGNOSIS — J984 Other disorders of lung: Secondary | ICD-10-CM | POA: Diagnosis not present

## 2017-11-28 DIAGNOSIS — I10 Essential (primary) hypertension: Secondary | ICD-10-CM | POA: Diagnosis not present

## 2017-11-28 DIAGNOSIS — R590 Localized enlarged lymph nodes: Secondary | ICD-10-CM | POA: Insufficient documentation

## 2017-11-28 DIAGNOSIS — Z8249 Family history of ischemic heart disease and other diseases of the circulatory system: Secondary | ICD-10-CM | POA: Diagnosis not present

## 2017-11-28 DIAGNOSIS — Z803 Family history of malignant neoplasm of breast: Secondary | ICD-10-CM | POA: Diagnosis not present

## 2017-11-28 DIAGNOSIS — M810 Age-related osteoporosis without current pathological fracture: Secondary | ICD-10-CM | POA: Diagnosis not present

## 2017-11-28 DIAGNOSIS — Z880 Allergy status to penicillin: Secondary | ICD-10-CM | POA: Diagnosis not present

## 2017-11-28 DIAGNOSIS — Z79899 Other long term (current) drug therapy: Secondary | ICD-10-CM | POA: Insufficient documentation

## 2017-11-28 DIAGNOSIS — Z90710 Acquired absence of both cervix and uterus: Secondary | ICD-10-CM | POA: Insufficient documentation

## 2017-11-28 DIAGNOSIS — Z9889 Other specified postprocedural states: Secondary | ICD-10-CM

## 2017-11-28 DIAGNOSIS — F329 Major depressive disorder, single episode, unspecified: Secondary | ICD-10-CM | POA: Diagnosis not present

## 2017-11-28 DIAGNOSIS — J439 Emphysema, unspecified: Secondary | ICD-10-CM | POA: Insufficient documentation

## 2017-11-28 DIAGNOSIS — F17210 Nicotine dependence, cigarettes, uncomplicated: Secondary | ICD-10-CM | POA: Diagnosis not present

## 2017-11-28 HISTORY — PX: ENDOBRONCHIAL ULTRASOUND: SHX5096

## 2017-11-28 SURGERY — ENDOBRONCHIAL ULTRASOUND (EBUS)
Anesthesia: General

## 2017-11-28 MED ORDER — FENTANYL CITRATE (PF) 100 MCG/2ML IJ SOLN
INTRAMUSCULAR | Status: AC
  Filled 2017-11-28: qty 2

## 2017-11-28 MED ORDER — FAMOTIDINE 20 MG PO TABS
20.0000 mg | ORAL_TABLET | Freq: Once | ORAL | Status: AC
  Administered 2017-11-28: 20 mg via ORAL

## 2017-11-28 MED ORDER — IPRATROPIUM-ALBUTEROL 0.5-2.5 (3) MG/3ML IN SOLN
3.0000 mL | RESPIRATORY_TRACT | Status: DC
  Administered 2017-11-28: 3 mL via RESPIRATORY_TRACT

## 2017-11-28 MED ORDER — ROCURONIUM BROMIDE 100 MG/10ML IV SOLN
INTRAVENOUS | Status: DC | PRN
  Administered 2017-11-28 (×2): 10 mg via INTRAVENOUS
  Administered 2017-11-28: 5 mg via INTRAVENOUS
  Administered 2017-11-28: 10 mg via INTRAVENOUS

## 2017-11-28 MED ORDER — FENTANYL CITRATE (PF) 100 MCG/2ML IJ SOLN
INTRAMUSCULAR | Status: AC
  Administered 2017-11-28: 25 ug via INTRAVENOUS
  Filled 2017-11-28: qty 2

## 2017-11-28 MED ORDER — SUCCINYLCHOLINE CHLORIDE 20 MG/ML IJ SOLN
INTRAMUSCULAR | Status: DC | PRN
  Administered 2017-11-28: 100 mg via INTRAVENOUS

## 2017-11-28 MED ORDER — ONDANSETRON HCL 4 MG/2ML IJ SOLN: 4.0000 mg | Freq: Once | INTRAMUSCULAR | Status: DC | PRN

## 2017-11-28 MED ORDER — METHYLPREDNISOLONE SODIUM SUCC 125 MG IJ SOLR
62.5000 mg | Freq: Once | INTRAMUSCULAR | Status: AC
  Administered 2017-11-28: 62.5 mg via INTRAVENOUS

## 2017-11-28 MED ORDER — ONDANSETRON HCL 4 MG/2ML IJ SOLN
INTRAMUSCULAR | Status: AC
  Filled 2017-11-28: qty 2

## 2017-11-28 MED ORDER — ONDANSETRON HCL 4 MG/2ML IJ SOLN
INTRAMUSCULAR | Status: DC | PRN
  Administered 2017-11-28: 4 mg via INTRAVENOUS

## 2017-11-28 MED ORDER — DEXAMETHASONE SODIUM PHOSPHATE 10 MG/ML IJ SOLN
INTRAMUSCULAR | Status: DC | PRN
  Administered 2017-11-28: 10 mg via INTRAVENOUS

## 2017-11-28 MED ORDER — FAMOTIDINE 20 MG PO TABS
ORAL_TABLET | ORAL | Status: AC
  Administered 2017-11-28: 20 mg via ORAL
  Filled 2017-11-28: qty 1

## 2017-11-28 MED ORDER — IPRATROPIUM-ALBUTEROL 0.5-2.5 (3) MG/3ML IN SOLN
3.0000 mL | Freq: Once | RESPIRATORY_TRACT | Status: AC
  Administered 2017-11-28: 3 mL via RESPIRATORY_TRACT

## 2017-11-28 MED ORDER — BUTAMBEN-TETRACAINE-BENZOCAINE 2-2-14 % EX AERO
1.0000 | INHALATION_SPRAY | Freq: Once | CUTANEOUS | Status: DC
  Filled 2017-11-28: qty 20

## 2017-11-28 MED ORDER — FENTANYL CITRATE (PF) 100 MCG/2ML IJ SOLN: INTRAMUSCULAR | Status: DC | PRN

## 2017-11-28 MED ORDER — METHYLPREDNISOLONE SODIUM SUCC 125 MG IJ SOLR
INTRAMUSCULAR | Status: AC
  Administered 2017-11-28: 62.5 mg via INTRAVENOUS
  Filled 2017-11-28: qty 2

## 2017-11-28 MED ORDER — LIDOCAINE HCL (CARDIAC) PF 100 MG/5ML IV SOSY: PREFILLED_SYRINGE | INTRAVENOUS | Status: DC | PRN

## 2017-11-28 MED ORDER — MIDAZOLAM HCL 5 MG/5ML IJ SOLN
INTRAMUSCULAR | Status: DC | PRN
  Administered 2017-11-28: 1 mg via INTRAVENOUS

## 2017-11-28 MED ORDER — LACTATED RINGERS IV SOLN
INTRAVENOUS | Status: DC
  Administered 2017-11-28: 12:00:00 via INTRAVENOUS

## 2017-11-28 MED ORDER — IPRATROPIUM-ALBUTEROL 0.5-2.5 (3) MG/3ML IN SOLN
RESPIRATORY_TRACT | Status: AC
  Administered 2017-11-28: 3 mL via RESPIRATORY_TRACT
  Filled 2017-11-28: qty 3

## 2017-11-28 MED ORDER — ONDANSETRON HCL 4 MG/2ML IJ SOLN
INTRAMUSCULAR | Status: AC
  Filled 2017-11-28: qty 8

## 2017-11-28 MED ORDER — FENTANYL CITRATE (PF) 100 MCG/2ML IJ SOLN
INTRAMUSCULAR | Status: DC | PRN
  Administered 2017-11-28: 100 ug via INTRAVENOUS
  Administered 2017-11-28: 50 ug via INTRAVENOUS

## 2017-11-28 MED ORDER — PROPOFOL 10 MG/ML IV BOLUS
INTRAVENOUS | Status: DC | PRN
  Administered 2017-11-28: 120 mg via INTRAVENOUS

## 2017-11-28 MED ORDER — SUGAMMADEX SODIUM 200 MG/2ML IV SOLN
INTRAVENOUS | Status: DC | PRN
  Administered 2017-11-28: 110 mg via INTRAVENOUS

## 2017-11-28 MED ORDER — FENTANYL CITRATE (PF) 100 MCG/2ML IJ SOLN
25.0000 ug | INTRAMUSCULAR | Status: DC | PRN
  Administered 2017-11-28: 25 ug via INTRAVENOUS

## 2017-11-28 MED ORDER — MIDAZOLAM HCL 2 MG/2ML IJ SOLN
INTRAMUSCULAR | Status: AC
  Filled 2017-11-28: qty 2

## 2017-11-28 MED ORDER — LIDOCAINE HCL 2 % EX GEL
1.0000 "application " | Freq: Once | CUTANEOUS | Status: DC
  Filled 2017-11-28: qty 5

## 2017-11-28 MED ORDER — IPRATROPIUM-ALBUTEROL 0.5-2.5 (3) MG/3ML IN SOLN
RESPIRATORY_TRACT | Status: AC
  Filled 2017-11-28: qty 3

## 2017-11-28 MED ORDER — SUCCINYLCHOLINE CHLORIDE 20 MG/ML IJ SOLN
INTRAMUSCULAR | Status: AC
  Filled 2017-11-28: qty 1

## 2017-11-28 MED ORDER — DEXAMETHASONE SODIUM PHOSPHATE 10 MG/ML IJ SOLN
INTRAMUSCULAR | Status: AC
  Filled 2017-11-28: qty 1

## 2017-11-28 MED ORDER — PROPOFOL 10 MG/ML IV BOLUS
INTRAVENOUS | Status: AC
  Filled 2017-11-28: qty 20

## 2017-11-28 MED ORDER — ROCURONIUM BROMIDE 50 MG/5ML IV SOLN
INTRAVENOUS | Status: AC
  Filled 2017-11-28: qty 1

## 2017-11-28 MED ORDER — PHENYLEPHRINE HCL 0.25 % NA SOLN
1.0000 | Freq: Four times a day (QID) | NASAL | Status: DC | PRN
  Filled 2017-11-28: qty 15

## 2017-11-28 MED ORDER — LIDOCAINE HCL (CARDIAC) PF 100 MG/5ML IV SOSY
PREFILLED_SYRINGE | INTRAVENOUS | Status: DC | PRN
  Administered 2017-11-28: 60 mg via INTRAVENOUS

## 2017-11-28 MED ORDER — SUGAMMADEX SODIUM 200 MG/2ML IV SOLN
INTRAVENOUS | Status: AC
  Filled 2017-11-28: qty 4

## 2017-11-28 NOTE — Anesthesia Post-op Follow-up Note (Signed)
Anesthesia QCDR form completed.        

## 2017-11-28 NOTE — Transfer of Care (Signed)
Immediate Anesthesia Transfer of Care Note  Patient: RENATA GAMBINO  Procedure(s) Performed: ENDOBRONCHIAL ULTRASOUND (N/A )  Patient Location: PACU  Anesthesia Type:General  Level of Consciousness: sedated  Airway & Oxygen Therapy: Patient Spontanous Breathing and Patient connected to face mask oxygen  Post-op Assessment: Report given to RN and Post -op Vital signs reviewed and stable  Post vital signs: Reviewed and stable  Last Vitals:  Vitals Value Taken Time  BP 150/76 11/28/2017  2:39 PM  Temp 37.2 C 11/28/2017  2:39 PM  Pulse 95 11/28/2017  2:42 PM  Resp 18 11/28/2017  2:42 PM  SpO2 97 % 11/28/2017  2:42 PM  Vitals shown include unvalidated device data.  Last Pain:  Vitals:   11/28/17 1143  TempSrc: Oral  PainSc: 8          Complications: No apparent anesthesia complications

## 2017-11-28 NOTE — Anesthesia Procedure Notes (Signed)
Procedure Name: Intubation Date/Time: 11/28/2017 1:08 PM Performed by: Dionne Bucy, CRNA Pre-anesthesia Checklist: Patient identified, Patient being monitored, Timeout performed, Emergency Drugs available and Suction available Patient Re-evaluated:Patient Re-evaluated prior to induction Oxygen Delivery Method: Circle system utilized Preoxygenation: Pre-oxygenation with 100% oxygen Induction Type: IV induction Ventilation: Mask ventilation without difficulty Laryngoscope Size: Mac and 3 Grade View: Grade I Tube type: Oral Tube size: 8.0 mm Number of attempts: 1 Airway Equipment and Method: Stylet Placement Confirmation: ETT inserted through vocal cords under direct vision,  positive ETCO2 and breath sounds checked- equal and bilateral Secured at: 21 cm Tube secured with: Tape Dental Injury: Teeth and Oropharynx as per pre-operative assessment

## 2017-11-28 NOTE — Discharge Instructions (Signed)
  AMBULATORY SURGERY  DISCHARGE INSTRUCTIONS   1) The drugs that you were given will stay in your system until tomorrow so for the next 24 hours you should not:  A) Drive an automobile B) Make any legal decisions C) Drink any alcoholic beverage   2) You may resume regular meals tomorrow.  Today it is better to start with liquids and gradually work up to solid foods.  You may eat anything you prefer, but it is better to start with liquids, then soup and crackers, and gradually work up to solid foods.   3) Please notify your doctor immediately if you have any unusual bleeding, trouble breathing, redness and pain at the surgery site, drainage, fever, or pain not relieved by medication.    4) Additional Instructions: TAKE A STOOL SOFTENER TWICE A DAY WHILE TAKING NARCOTIC PAIN MEDICINE TO PREVENT CONSTIPATION   Please contact your physician with any problems or Same Day Surgery at [phone], Monday through Friday 6 am to 4 pm, or [facility] [facility] Main number at [phone].   

## 2017-11-28 NOTE — Interval H&P Note (Signed)
History and Physical Interval Note:  11/28/2017 12:15 PM  Wendy Arias  has presented today for surgery, with the diagnosis of LUNG MASS  The various methods of treatment have been discussed with the patient and family. After consideration of risks, benefits and other options for treatment, the patient has consented to  Procedure(s): ENDOBRONCHIAL ULTRASOUND (N/A) as a surgical intervention .  The patient's history has been reviewed, patient examined, no change in status, stable for surgery.  I have reviewed the patient's chart and labs.  Questions were answered to the patient's satisfaction.     Laverle Hobby

## 2017-11-28 NOTE — Op Note (Signed)
  [facility]            Bronchoscopy Note   FINDINGS/SUMMARY:  - Mild erythema, copious secretions throughout both airways, mucous plugs were suctioned from both lungs. -Narrowing seen in the medial aspects of the right middle lobe and right lower lobes.  Fluoroscopic guided transbronchial biopsies taken from right middle and right lower lobes. -Fluoroscopic guided transbronchial brushings taken from right middle lobe and right lower lobes. -Bronchoalveolar lavage taken from right middle lobe and right lower lobes. - EBUS guided lymph node sampling performed at the subcarinal and the right paratracheal nodes which were both enlarged.  Indication: Mediastinal lymphadenopathy, right lung mass. The patient (or their representative) was informed of the risks (including but not limited to bleeding, infection, respiratory failure, lung injury, tooth/oral injury) and benefits of the procedure and gave consent, see chart.   Pre-op diagnosis: Mediastinal lymphadenopathy, right lower lobe lung mass. Post-op diagnosis: Same Estimated blood loss: 10 cc  Medications for procedure: Please see anesthesia notes.  Procedure description:  After obtaining informed consent, timeout was called to confirm the patient the procedure.  Patient was intubated by anesthesia please see their notes for further details.  The flexible fiberoptic EBUS bronchoscope was passed by the endotracheal tube, subcarinal enlarged lymphadenopathy was visualized.  Several needle biopsies were taken under EBUS guidance.  The bronchoscope was then taken to the right paratracheal node station, here several more EBUS guided needle biopsies were taken.  The EBUS scope was then removed. The white light bronchoscope was passed by the endotracheal tube, an anatomical tumor was undertaken.  The left lung appeared normal, though there appeared to be moderate mucosal secretions and erythema throughout both lungs which were  suctioned. The bronchoscope was then taken to the right middle lobe transbronchial cytology brushings were performed under fluoroscopic guidance in the medial segments of the right middle lobe, this process was repeated in the medial segments of the right lower lobe.  Subsequently transbronchial fluoroscopic guided forceps biopsies were taken from the similar aspects of both the right middle lobe and right lower lobes.  Washings were then performed in both areas.  The bronchial lavage obtained from the right middle lobe was sent for cultures in addition to cytology.  As adequate samples had been obtained, the procedure was terminated, patient was extubated taken to recovery.   Condition post procedure: Stable   Complications: None noted   Patient instructions: Patient has a follow-up with oncology later this week, I have asked the family to make an appointment with our office for further procedural follow-up next week.   Marda Stalker, MD.  Board Certified in Internal Medicine, Pulmonary Medicine, [place], and Sleep Medicine.  [facility] Office Number: [phone]  Patricia Pesa, M.D.  Cheral Marker, M.D  11/28/2017

## 2017-11-28 NOTE — Addendum Note (Signed)
Addendum  created 11/28/17 1553 by Alvin Critchley, MD   Sign clinical note

## 2017-11-28 NOTE — Anesthesia Postprocedure Evaluation (Addendum)
Anesthesia Post Note  Patient: Wendy Arias  Procedure(s) Performed: ENDOBRONCHIAL ULTRASOUND (N/A )  Patient location during evaluation: PACU Anesthesia Type: General Level of consciousness: awake and alert and oriented Pain management: pain level controlled Respiratory status: spontaneous breathing and patient connected to nasal cannula oxygen Cardiovascular status: blood pressure returned to baseline Anesthetic complications: no Comments: Patient has been running lower sats pre induction of upper 80s to 90 and we had to give mask O2 to get them up We will contact the pulmonologist for some guidance      Last Vitals:  Vitals:   11/28/17 1509 11/28/17 1515  BP: 106/61   Pulse: 80 94  Resp: 15 14  Temp:    SpO2: (!) 89% 91%    Last Pain:  Vitals:   11/28/17 1515  TempSrc:   PainSc: Asleep                 Jonathen Rathman

## 2017-11-28 NOTE — Anesthesia Preprocedure Evaluation (Signed)
Anesthesia Evaluation  Patient identified by MRN, date of birth, ID band Patient awake    Reviewed: Allergy & Precautions, H&P , NPO status , Patient's Chart, lab work & pertinent test results, reviewed documented beta blocker date and time   History of Anesthesia Complications Negative for: history of anesthetic complications  Airway Mallampati: III  TM Distance: >3 FB Neck ROM: full    Dental  (+) Edentulous Upper, Partial Lower, Dental Advidsory Given, Poor Dentition   Pulmonary shortness of breath, asthma , neg sleep apnea, neg COPD, neg recent URI, Current Smoker,           Cardiovascular Exercise Tolerance: Good hypertension, (-) angina+ CAD, + Past MI and + Cardiac Stents  (-) CABG (-) dysrhythmias (-) Valvular Problems/Murmurs     Neuro/Psych PSYCHIATRIC DISORDERS Anxiety Depression negative neurological ROS     GI/Hepatic negative GI ROS, Neg liver ROS,   Endo/Other  negative endocrine ROS  Renal/GU negative Renal ROS  negative genitourinary   Musculoskeletal   Abdominal   Peds  Hematology negative hematology ROS (+)   Anesthesia Other Findings Past Medical History: No date: Anxiety No date: Asthma No date: Depression No date: Hypertension No date: Myocardial infarction ([place]) No date: Osteoporosis   Reproductive/Obstetrics negative OB ROS                             Anesthesia Physical Anesthesia Plan  ASA: II  Anesthesia Plan: General   Post-op Pain Management:    Induction: Intravenous  PONV Risk Score and Plan: 2 and Ondansetron and Dexamethasone  Airway Management Planned: Oral ETT  Additional Equipment:   Intra-op Plan:   Post-operative Plan: Extubation in OR  Informed Consent: I have reviewed the patients History and Physical, chart, labs and discussed the procedure including the risks, benefits and alternatives for the proposed anesthesia with the  patient or authorized representative who has indicated his/her understanding and acceptance.   Dental Advisory Given  Plan Discussed with: Anesthesiologist, CRNA and Surgeon  Anesthesia Plan Comments:         Anesthesia Quick Evaluation

## 2017-11-29 ENCOUNTER — Encounter: Payer: Self-pay | Admitting: Internal Medicine

## 2017-11-29 LAB — SURGICAL PATHOLOGY

## 2017-11-29 LAB — CYTOLOGY - NON PAP

## 2017-11-30 ENCOUNTER — Other Ambulatory Visit: Payer: Self-pay | Admitting: *Deleted

## 2017-11-30 ENCOUNTER — Encounter
Admission: RE | Admit: 2017-11-30 | Discharge: 2017-11-30 | Disposition: A | Discharge status: Home / Self Care | Payer: PPO | Source: Ambulatory Visit | Attending: Urgent Care | Admitting: Urgent Care

## 2017-11-30 DIAGNOSIS — R590 Localized enlarged lymph nodes: Secondary | ICD-10-CM | POA: Insufficient documentation

## 2017-11-30 DIAGNOSIS — S2231XD Fracture of one rib, right side, subsequent encounter for fracture with routine healing: Secondary | ICD-10-CM | POA: Diagnosis not present

## 2017-11-30 DIAGNOSIS — R918 Other nonspecific abnormal finding of lung field: Secondary | ICD-10-CM | POA: Diagnosis not present

## 2017-11-30 LAB — CULTURE, BAL-QUANTITATIVE W GRAM STAIN: Special Requests: NORMAL

## 2017-11-30 LAB — CULTURE, BAL-QUANTITATIVE

## 2017-11-30 LAB — ACID FAST SMEAR (AFB): ACID FAST SMEAR - AFSCU2: NEGATIVE

## 2017-11-30 MED ORDER — TECHNETIUM TC 99M MEDRONATE IV KIT
22.3500 | PACK | Freq: Once | INTRAVENOUS | Status: AC | PRN
  Administered 2017-11-30: 22.35 via INTRAVENOUS

## 2017-11-30 NOTE — Telephone Encounter (Signed)
Pt's sister has requested refill of hydrocodone. Per Gaspar Bidding, NP would like to hold off on refill at this time and pt will need to request refill from PCP. Pt's sister has been made aware.

## 2017-12-02 ENCOUNTER — Encounter: Payer: Self-pay | Admitting: *Deleted

## 2017-12-02 ENCOUNTER — Other Ambulatory Visit: Payer: Self-pay

## 2017-12-02 ENCOUNTER — Inpatient Hospital Stay: Payer: PPO | Attending: Hematology and Oncology | Admitting: Hematology and Oncology

## 2017-12-02 ENCOUNTER — Encounter: Payer: Self-pay | Admitting: Hematology and Oncology

## 2017-12-02 VITALS — BP 112/81 | HR 73 | Temp 97.2°F | Resp 16 | Ht 62.0 in | Wt 114.0 lb

## 2017-12-02 DIAGNOSIS — R599 Enlarged lymph nodes, unspecified: Secondary | ICD-10-CM | POA: Diagnosis not present

## 2017-12-02 DIAGNOSIS — J439 Emphysema, unspecified: Secondary | ICD-10-CM

## 2017-12-02 DIAGNOSIS — Z87891 Personal history of nicotine dependence: Secondary | ICD-10-CM

## 2017-12-02 DIAGNOSIS — R918 Other nonspecific abnormal finding of lung field: Secondary | ICD-10-CM

## 2017-12-02 DIAGNOSIS — J90 Pleural effusion, not elsewhere classified: Secondary | ICD-10-CM | POA: Insufficient documentation

## 2017-12-02 DIAGNOSIS — I10 Essential (primary) hypertension: Secondary | ICD-10-CM | POA: Diagnosis not present

## 2017-12-02 DIAGNOSIS — R590 Localized enlarged lymph nodes: Secondary | ICD-10-CM

## 2017-12-02 DIAGNOSIS — F329 Major depressive disorder, single episode, unspecified: Secondary | ICD-10-CM | POA: Insufficient documentation

## 2017-12-02 DIAGNOSIS — F419 Anxiety disorder, unspecified: Secondary | ICD-10-CM | POA: Insufficient documentation

## 2017-12-02 DIAGNOSIS — I252 Old myocardial infarction: Secondary | ICD-10-CM | POA: Insufficient documentation

## 2017-12-02 DIAGNOSIS — J45909 Unspecified asthma, uncomplicated: Secondary | ICD-10-CM | POA: Insufficient documentation

## 2017-12-02 NOTE — Progress Notes (Signed)
[facility] day:  12/02/2017  Chief Complaint: Wendy Arias is a 67 y.o. female with an abnormal chest CT who is seen for assessment after interval bronchoscopy and discussion regarding direction of therapy.  HPI:  The patient was last seen in the medical oncology clinic on 11/22/2017 for initial consultation.  Imaging studies were worrisome for advanced lung cancer.  We discussed bronchoscopy for tissue diagnosis.  PET scan on 11/22/2017 revealed a 4.6 x 4.0 cm (SUV 8.8) hypermetabolic masslike foci of consolidation in the medial right middle lobe and anterior right lower lobe (6.4 x 4.8 cm; SUV 7.7).  There was a hypermetabolic irregular nodular focus of consolidation in the peripheral right middle lobe (SUV 3.3).  There was hypermetabolic right hilar (SUV 3.7), subcarinal (1.3 cm; SUV 4.5), and right paratracheal adenopathy (1.6 cm; SUV 4.3), nonspecific.  There was no extrathoracic or skeletal hypermetabolic disease.  There was trace dependent right pleural effusion.  Bone scan on 11/30/2017 revealed no evidence of osseous metastatic disease.  She had a healing fracture of the anterior LEFT seventh rib.  She underwent endobronchial ultrasound on 11/28/2017 by Dr. Laverle Hobby.  Findings revealed mild erythema, copious secretions throughout both airways, mucous plugs were suctioned from both lungs.  There was narrowing seen in the medial aspects of the right middle lobe and right lower lobes.  Fluoroscopic guided transbronchial biopsies were taken from right middle and right lower lobes.  Bronchoalveolar lavage taken from right middle lobe and right lower lobes. EBUS guided lymph node sampling was performed at the subcarinal and the right paratracheal nodes which were both enlarged.  Surgical pathology and cytology revealed reactive bronchial and nodal inflammation without malignancy.  Symptomatically, patient feels "crappy" today. She notes that  she experienced coughing following her bronchoscopy. Patient denies other acute symptoms. Patient denies that she has experienced any B symptoms. She denies any interval infections.   Patient maintains an adequate appetite, and notes that she is eating well. Weight, compared to her last visit to the clinic, has decreased by 4 pounds.   Patient denies pain in the clinic today.   Past Medical History:  Diagnosis Date  . Anxiety   . Asthma   . Depression   . Hypertension   . Myocardial infarction ([place])   . Osteoporosis     Past Surgical History:  Procedure Laterality Date  . ABDOMINAL HYSTERECTOMY     partial  . CARDIAC CATHETERIZATION    . CARDIAC SURGERY    . COLONOSCOPY WITH PROPOFOL N/A 04/12/2017   Procedure: COLONOSCOPY WITH PROPOFOL;  Surgeon: Lollie Sails, MD;  Location: [facility] ENDOSCOPY;  Service: Endoscopy;  Laterality: N/A;  . CORONARY ANGIOPLASTY    . ENDOBRONCHIAL ULTRASOUND N/A 11/28/2017   Procedure: ENDOBRONCHIAL ULTRASOUND;  Surgeon: Laverle Hobby, MD;  Location: [facility] ORS;  Service: Pulmonary;  Laterality: N/A;  . REDUCTION MAMMAPLASTY      Family History  Problem Relation Age of Onset  . Breast cancer Mother 40  . Hypertension Mother   . Dementia Mother   . Breast cancer Maternal Aunt 70  . Breast cancer Other 35  . Hypertension Father   . Heart disease Father   . Diabetes Father   . Diabetes Sister     Social History:  reports that she has been smoking.  She has a 10.00 pack-year smoking history. She has never used smokeless tobacco. She reports that she drinks about 0.6 oz of alcohol per week. She  reports that she does not use drugs.  She smokes 1-2 packs every 2 days. She has smoked for approximately 20 years. Patient denies known exposures to radiation on toxins.  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  She previously lived in [place].  She moved to [place] 20 years ago.  She lives in [place].  The patient is  accompanied by her 4 children.  Allergies:  Allergies  Allergen Reactions  . Penicillins Other (See Comments)    Has patient had a PCN reaction causing immediate rash, facial/tongue/throat swelling, SOB or lightheadedness with hypotension: Yes Has patient had a PCN reaction causing severe rash involving mucus membranes or skin necrosis: Unknown Has patient had a PCN reaction that required hospitalization: Unknown Has patient had a PCN reaction occurring within the last 10 years: Unknown If all of the above answers are "NO", then may proceed with Cephalosporin use.    Current Medications: Current Outpatient Medications  Medication Sig Dispense Refill  . alprazolam (XANAX) 2 MG tablet Take 1 mg by mouth 4 (four) times daily.  2  . benzonatate (TESSALON PERLES) 100 MG capsule Take 2 capsules (200 mg total) by mouth 3 (three) times daily. 90 capsule 2  . busPIRone (BUSPAR) 10 MG tablet Take 10 mg by mouth 2 (two) times daily.    . citalopram (CELEXA) 20 MG tablet Take 20 mg by mouth daily.    Marland Kitchen dicyclomine (BENTYL) 10 MG capsule Take 10 mg by mouth every 6 (six) hours as needed for spasms.    . hydrOXYzine (ATARAX/VISTARIL) 25 MG tablet Take 25 mg by mouth as needed (sleep). 1-4 as needed for sleep  0  . ibandronate (BONIVA) 150 MG tablet Take 150 mg by mouth every 30 (thirty) days. Take in the morning with a full glass of water, on an empty stomach, and do not take anything else by mouth or lie down for the next 30 min.    . metoprolol succinate (TOPROL-XL) 50 MG 24 hr tablet Take 50 mg by mouth daily. Take with or immediately following a meal.    . simvastatin (ZOCOR) 40 MG tablet Take 40 mg by mouth daily.    Marland Kitchen tiotropium (SPIRIVA) 18 MCG inhalation capsule Place 1 capsule (18 mcg total) into inhaler and inhale daily. 30 capsule 12  . venlafaxine XR (EFFEXOR-XR) 75 MG 24 hr capsule Take 75 mg by mouth daily with breakfast.     . vitamin B-12 (CYANOCOBALAMIN) 1000 MCG tablet Take 1,000 mcg  by mouth daily.    Marland Kitchen zolpidem (AMBIEN) 10 MG tablet Take 5-10 mg by mouth at bedtime and may repeat dose one time if needed. 9m at bedtime, may repeat 550mif needed  2   No current facility-administered medications for this visit.     Review of Systems  Constitutional: Positive for malaise/fatigue and weight loss (15-20 pound weight loss; weight down 4 pounds since last visit). Negative for diaphoresis and fever.  HENT: Negative.  Negative for ear pain, nosebleeds and sore throat.   Eyes: Negative.  Negative for double vision, photophobia, pain and discharge.  Respiratory: Positive for cough and shortness of breath (exertional). Negative for hemoptysis and sputum production.   Cardiovascular: Negative for chest pain, palpitations, orthopnea, leg swelling and PND.  Gastrointestinal: Positive for diarrhea. Negative for abdominal pain, blood in stool, constipation, melena, nausea and vomiting.       Not eating much. Bloating and sensation of distention.  Genitourinary: Negative for dysuria, frequency, hematuria and  urgency.  Musculoskeletal: Positive for joint pain (RIGHT shoulder and neck) and myalgias. Negative for back pain and falls.  Skin: Negative for itching and rash.  Neurological: Negative for dizziness, tremors, weakness and headaches.  Endo/Heme/Allergies: Does not bruise/bleed easily.  Psychiatric/Behavioral: Positive for depression and memory loss. Negative for suicidal ideas. The patient is not nervous/anxious and does not have insomnia.   All other systems reviewed and are negative.  Performance status (ECOG): 1 - Symptomatic but completely ambulatory  Physical Exam: Blood pressure 112/81, pulse 73, temperature (!) 97.2 F (36.2 C), temperature source Tympanic, resp. rate 16, height _0  (1.575 m), weight 114 lb (51.7 kg). GENERAL:  Fatigued appearing woman sitting comfortably in the conference room in no acute distress. MENTAL STATUS:  Alert and oriented to person, place  and time. HEAD:  Long straight brown hair.  Normocephalic, atraumatic, face symmetric, no Cushingoid features. EYES:  Red glasses. Green eyes.  No conjunctivitis or scleral icterus. NEUROLOGICAL: Appropriate. PSYCH:  Appropriate.   No visits with results within 3 Day(s) from this visit.  Latest known visit with results is:  Admission on 11/28/2017, Discharged on 11/28/2017  Component Date Value Ref Range Status  . CYTOLOGY - NON GYN 11/28/2017    Final-Edited                   Value:Cytology - Non PAP CASE: [id number] PATIENT: Wendy Arias Non-Gyn Cytology Report     SPECIMEN SUBMITTED: A. Subcarinal node; FNA  CLINICAL HISTORY: None Provided  PRE-OPERATIVE DIAGNOSIS: None provided  POST-OPERATIVE DIAGNOSIS: None provided.     DIAGNOSIS: A.  SUBCARINAL LYMPH NODES; EBUS GUIDED FINE-NEEDLE ASPIRATION: - MIXED INFLAMMATION AND CARTILAGE.  Note: Cell block material, ThinPrep, Diff Quik and Pap stained slides were reviewed. Concurrent cases: [phone]; [id number], 294, 295, 296, 297    GROSS DESCRIPTION: A. Site: subcarinal node Procedure: EBUS Cytotechnologist: Shon Baton Specimen(s) collected: 2 Diff Quik stained slides 2  Pap stained slides Specimen labeled: subcarinal node      Volume: n/a      Description: pink cytology fluid with wispy material      Submitted for: thin prep and one cell block    Final Diagnosis performed by Delorse Lek, MD.   Electronically signed 11/29/2017 4:56:02PM Correction performed by Izora Ribas, MD.   Electronically signed 11/29/2017 5:02:18PM The electronic signature indicates that the named Attending Pathologist has evaluated the specimen  Technical component performed at [facility], [address] [place], [place] [address] Lab: [phone] Dir: Rush Farmer, MD, MMM  Professional component performed at [facility], [facility], [place], [place], [place] [address] Lab:  [phone] Dir: Dellia Nims. Rubinas, MD   . CYTOLOGY - NON GYN 11/28/2017    Final                   Value:Cytology - Non PAP CASE: [id number] PATIENT: Wendy Arias Non-Gyn Cytology Report     SPECIMEN SUBMITTED: A. Paratracheal, right; FNA  CLINICAL HISTORY: None Provided  PRE-OPERATIVE DIAGNOSIS: None provided  POST-OPERATIVE DIAGNOSIS: None provided.     DIAGNOSIS: A.  LYMPH NODE, RIGHT PARATRACHEAL; EBUS GUIDED FINE-NEEDLE ASPIRATION: - LYMPH NODE MATERIAL AND REACTIVE BRONCHIAL EPITHELIUM.  Note: Cell block material, ThinPrep, Diff Quik and Pap stained slides were reviewed. Concurrent cases: [phone]; [id number], 294, 295, 296, 297    GROSS DESCRIPTION: A.  Site: Right paratracheal Procedure: EBUS Cytotechnologist: Shon Baton Specimen(s) collected: 1 Diff Quik stained slides 1  Pap stained slides Specimen labeled: Right paratracheal      Volume: n/a      Description: red cytology fluid with tissue fragments      Submitted for: thin prep and one cell block   Final Diagnosis performed by Delorse Lek, MD.   Electronically signed 6/4/201                         9 5:01:28PM The electronic signature indicates that the named Attending Pathologist has evaluated the specimen  Technical component performed at [facility], [address] [place], [place] [address] Lab: [phone] Dir: Rush Farmer, MD, MMM  Professional component performed at [facility], [facility], [place], [place], [place] [address] Lab: [phone] Dir: Dellia Nims. Reuel Derby, MD   . SURGICAL PATHOLOGY 11/28/2017    Final                   Value:Surgical Pathology CASE: [phone] PATIENT: Wendy Arias Surgical Pathology Report     SPECIMEN SUBMITTED: A. Lobe, right middle  CLINICAL HISTORY: None provided  PRE-OPERATIVE DIAGNOSIS: None provided  POST-OPERATIVE DIAGNOSIS: None provided.     DIAGNOSIS: A.  LUNG, RIGHT MIDDLE LOBE; FORCEPS  BIOPSY: - ABUNDANT ACUTE INFLAMMATION AND REACTIVE BRONCHIAL EPITHELIUM.  Concurrent cases: [id number], [id number], 293, 294, 295, 296, 297   GROSS DESCRIPTION: A. Labeled: RML bx Received: in formalin Tissue fragment(s): multiple Size: aggregate, 0.9 x 0.1 x 0.1 cm Description: pink tissue fragments Entirely submitted in one cassette.   Final Diagnosis performed by Delorse Lek, MD.   Electronically signed 11/29/2017 4:47:52PM The electronic signature indicates that the named Attending Pathologist has evaluated the specimen  Technical component performed at [facility], [address] [place], [place] [address] Lab: [phone] Dir: Rush Farmer, MD, MMM                           Professional component performed at [facility], [facility], [place], [place], [place] [address] Lab: [phone] Dir: Dellia Nims. Reuel Derby, MD   . SURGICAL PATHOLOGY 11/28/2017    Final                   Value:Surgical Pathology CASE: [id number] PATIENT: Wendy Arias Surgical Pathology Report     SPECIMEN SUBMITTED: A. Lobe, right lower  CLINICAL HISTORY: None provided  PRE-OPERATIVE DIAGNOSIS: None provided  POST-OPERATIVE DIAGNOSIS: None provided.     DIAGNOSIS: A.  LUNG, RIGHT LOWER LOBE; FORCEPS BIOPSY: - REACTIVE BRONCHIAL EPITHELIUM AND SMALL FRAGMENTS OF LUNG PARENCHYMA, NEGATIVE FOR MALIGNANCY.  Concurrent cases: [id number], [id number], 293, 294, 295, 296, 297 Note: Cell block material and Diff Quik slides were reviewed.   GROSS DESCRIPTION:  A. Labeled: RLL bx Received: in formalin Tissue fragment(s): multiple Size: aggregate, 0.6 x 0.2 x 0.1 cm Description: pink tissue fragments Entirely submitted in one cassette.  2 Diff Quik slides prepared    Final Diagnosis performed by Delorse Lek, MD.   Electronically signed 11/29/2017 4:51:18PM The electronic signature indicates that the named Attending Pathologist has evaluated the specimen  Te                          chnical component performed at [facility], [address] [place], [place] [address] Lab: [phone] Dir: Rush Farmer, MD, MMM  Professional component performed at  [facility], [facility], [place], [place], [place] [address] Lab: [phone] Dir: Dellia Nims. Rubinas, MD   . CYTOLOGY - NON GYN 11/28/2017    Final                   Value:Cytology - Non PAP CASE: [id number] PATIENT: Wendy Arias Non-Gyn Cytology Report     SPECIMEN SUBMITTED: A. Lobe, right middle; brushing  CLINICAL HISTORY: None Provided  PRE-OPERATIVE DIAGNOSIS: None provided  POST-OPERATIVE DIAGNOSIS: None provided.     DIAGNOSIS: A.  LUNG, RIGHT MIDDLE LOBE; BRONCHIAL BRUSHING: - ABUNDANT ACUTE INFLAMMATION AND REACTIVE BRONCHIAL EPITHELIUM.  Concurrent cases: [phone]; [id number], 293, 295, 296, 297 Note: Cell block material, ThinPrep, and Diff Quik slides were reviewed.    GROSS DESCRIPTION: A. Site: Right middle lobe Procedure: bronchoscopy Cytotechnologist: Shon Baton Specimen(s) collected: 4 Diff Quik stained slides 0  Pap stained slides Specimen labeled: RML brush      Volume: n/a      Description: pink CytoLyt with metal brush      Submitted for: thin prep and one cell block   Final Diagnosis performed by Delorse Lek, MD.   Electronically signed 11/29/2017 5:07:04PM The electronic signature                          indicates that the named Attending Pathologist has evaluated the specimen  Technical component performed at [facility], [address] [place], [place] [address] Lab: [phone] Dir: Rush Farmer, MD, MMM  Professional component performed at [facility], [facility], [place], [place], [place] [address] Lab: [phone] Dir: Dellia Nims. Rubinas, MD   . CYTOLOGY - NON GYN 11/28/2017    Final                   Value:Cytology - Non PAP CASE: [id number] PATIENT: Wendy Arias Non-Gyn Cytology  Report     SPECIMEN SUBMITTED: A. Lobe, right middle; lavage  CLINICAL HISTORY: None Provided  PRE-OPERATIVE DIAGNOSIS: None provided  POST-OPERATIVE DIAGNOSIS: None provided.     DIAGNOSIS:  A.  LUNG, RIGHT MIDDLE LOBE; BRONCHIAL LAVAGE: - ABUNDANT ACUTE INFLAMMATION, MACROPHAGES AND REACTIVE BRONCHIAL EPITHELIUM.  Concurrent cases: [phone]; [id number], 293, 294, 296, 297 Note: Cell block material and ThinPrep slides were reviewed.   GROSS DESCRIPTION: A. Site: Right middle lobe Procedure: bronchoscopy Cytotechnologist: Shon Baton Specimen(s) collected: 0 Diff Quik stained slides 0  Pap stained slides Specimen labeled: RML wash      Volume: 10 mL      Description: pink fluid      Submitted for: thin prep and one cell block   Final Diagnosis performed by Delorse Lek, MD.   Electronically signed 11/29/2017 5:17:23PM The electronic signature indicates that the n                         amed Attending Pathologist has evaluated the specimen  Technical component performed at [facility], [address], [place], [place] [address] Lab: [phone] Dir: Rush Farmer, MD, MMM  Professional component performed at [facility], [facility], [place], [place], [place] [address] Lab: [phone] Dir: Dellia Nims. Rubinas, MD   . CYTOLOGY - NON GYN 11/28/2017    Final                   Value:Cytology - Non PAP CASE: [id number] PATIENT: Wendy Arias Non-Gyn Cytology Report     SPECIMEN SUBMITTED: A.  Lobe, right lower; brushing  CLINICAL HISTORY: None Provided  PRE-OPERATIVE DIAGNOSIS: None provided  POST-OPERATIVE DIAGNOSIS: None provided.     DIAGNOSIS: A.  LUNG, RIGHT LOWER LOBE; BRONCHIAL BRUSHING: - REACTIVE BRONCHIAL EPITHELIUM.  Concurrent cases: [phone]; [id number], 293, 294, 295, 297 Note: Cell block material, Diff Quik and ThinPrep slides were reviewed.    GROSS DESCRIPTION: A. Site: Right lower  lobe Procedure: bronchoscopy Cytotechnologist: Shon Baton Specimen(s) collected: 4 Diff Quik stained slides 0  Pap stained slides Specimen labeled: RLL brush      Volume: n/a      Description: CytoLyt with metal brush      Submitted for: thin prep and one cell block    Final Diagnosis performed by Delorse Lek, MD.   Electronically signed 11/29/2017 5:21:24PM The electronic signature indicates that the named Attending Pat                         hologist has evaluated the specimen  Technical component performed at [facility], [address] [place], [place] [address] Lab: [phone] Dir: Rush Farmer, MD, MMM  Professional component performed at [facility], [facility], [place], [place], [place] [address] Lab: [phone] Dir: Dellia Nims. Rubinas, MD   . CYTOLOGY - NON GYN 11/28/2017    Final                   Value:Cytology - Non PAP CASE: [id number] PATIENT: Wendy Arias Non-Gyn Cytology Report     SPECIMEN SUBMITTED: A. Lobe, right lower; lavage  CLINICAL HISTORY: None Provided  PRE-OPERATIVE DIAGNOSIS: None provided  POST-OPERATIVE DIAGNOSIS: None provided.     DIAGNOSIS: A.  LUNG, RIGHT LOWER LOBE; BRONCHIAL LAVAGE: - ABUNDANT ACUTE INFLAMMATION, MACROPHAGES AND REACTIVE BRONCHIAL EPITHELIUM.  Concurrent cases: [phone]; [id number], 293, 294, 295, 296 Note: Cell block material and ThinPrep slides were reviewed.   GROSS DESCRIPTION: A. Site: Right lower lobe Procedure: bronchoscopy Cytotechnologist: Shon Baton Specimen(s) collected: 0 Diff Quik stained slides 0 Pap stained slides Specimen labeled: RLL wash      Volume: 20 mL      Description: cloudy red fluid      Submitted for: thin prep and one cell block   Final Diagnosis performed by Delorse Lek, MD.   Electronically signed 11/29/2017 5:24:01PM The electronic signature indicates that the                          named Attending Pathologist has evaluated the  specimen  Technical component performed at [facility], [address] [place], [place] [address] Lab: [phone] Dir: Rush Farmer, MD, MMM  Professional component performed at [facility], [facility], [place], [place], [place] [address] Lab: [phone] Dir: Dellia Nims. Reuel Derby, MD   . Specimen Description 11/28/2017    Final                   Value:BRONCHIAL ALVEOLAR LAVAGE Performed at [facility], [address] [place], [place] [address]   . Special Requests 11/28/2017    Final                   Value:Normal Performed at [facility], [address] [place], [place] [address]   . Gram Stain 11/28/2017    Final                   Value:FEW WBC PRESENT,BOTH PMN AND MONONUCLEAR NO ORGANISMS SEEN  Performed at [facility], [place] [address], [place], [place] [address]   . Culture 11/28/2017 5,000 COLONIES/mL VIRIDANS STREPTOCOCCUS*  Final  . Report Status 11/28/2017 11/30/2017 FINAL   Final  . AFB Specimen Processing 11/28/2017 Concentration   Final  . Acid Fast Smear 11/28/2017 Negative   Final   Comment: (NOTE) Performed At: [facility] [place], [place] [id number] Rush Farmer MD YC:[id number]   . Source (AFB) 11/28/2017 BRONCHIAL ALVEOLAR LAVAGE   Final   Performed at [facility], [address] [place], [place] [address]  . Specimen Description 11/28/2017    Final                   Value:BRONCHIAL ALVEOLAR LAVAGE Performed at [facility], [address] [place], [place] [address]   . Special Requests 11/28/2017    Final                   Value:Normal Performed at [facility], [address] [place],  [address]   . Culture 11/28/2017    Final                   Value:NO FUNGUS ISOLATED AFTER 3 DAYS Performed at [facility], [place] [address] [place],  [address]   . Report Status 11/28/2017 PENDING   Incomplete    Assessment:  Wendy Arias is a  67 y.o. female with large RML and RLL masses worrisome for malignancy.  She presented with abdominal pain.  Bronchoscopy and endobronchial ultrasound on 11/28/2017 revealed mild erythema, copious secretions throughout both airways.  Mucous plugs were suctioned from both lungs.  There was narrowing seen in the medial aspects of the right middle lobe and right lower lobes.  Fluoroscopic guided transbronchial biopsies from right middle and right lower lobes, bronchoalveolar lavage from right middle lobe and right lower lobes and EBUS guided lymph node sampling at the subcarinal and the right paratracheal nodes was performed.  Pathology revealed reactive bronchial and nodal inflammation without malignancy.  Chest CT on 11/17/2017 revealed a necrotic 5.4 x 4.2 cm RLL mass and a 4.4 x 4.1 cm RML mass.  There was mediastinal and right hilar adenopathy.  There was a 8 mm subpleural RLL pulmonary nodule.  There was a small left supraclvicular node.  There was no definite osseous metastasis. There was emphysema.  Clinical stage T4N2.  Head CT without contrast was negative on 11/02/2017.  Head MRI without contrast was negative on 11/03/2017.  CEA is 2.4.  PET scan on 11/22/2017 revealed a 4.6 x 4.0 cm (SUV 8.8) hypermetabolic masslike foci of consolidation in the medial right middle lobe and anterior right lower lobe (6.4 x 4.8 cm; SUV 7.7).  There was a hypermetabolic irregular nodular focus of consolidation in the peripheral right middle lobe (SUV 3.3).  There was hypermetabolic right hilar (SUV 3.7), subcarinal (1.3 cm; SUV 4.5), and right paratracheal adenopathy (1.6 cm; SUV 4.3), nonspecific.  There was no extrathoracic or skeletal hypermetabolic disease.  There was trace dependent right pleural effusion.  Bone scan on 11/30/2017 revealed no evidence of osseous metastatic disease.  She had a healing fracture of the anterior LEFT seventh rib.  Colonoscopy on 04/12/2017 revealed a tubular adenoma and  hyperplastic colon polyp.  Symptomatically, she continues to have respiratory symptoms.  Bronchoscopy revealed no evidence of malignancy.  Plan: 1. Review interval bronchoscopy- reactive.  No malignancy.  Contact Delorse Lek. 2. Discuss  interval bone scan- no malignancy. 3. Present patient again at multidisciplinary tumor board on 12/08/2017. Suspect sampling error from transbronchial biopsies obtained during recent EBUS. Review that imagining demonstrated hypermetabolic lesions in the RIGHT lung (max SUV 8.8), as well as hypermetabolic RIGHT hilar, subcarinal, and paratracheal adenopathy (max SUV 4.5). Recommendations are for repeat sampling by Dr. Genevive Bi or interventional radiology for diagnosis and staging purposes. Patient in agreement.  4. Will call patient with an appointment after tumor board.    Honor Loh, NP  12/02/2017, 11:42 AM   I saw and evaluated the patient, participating in the key portions of the service and reviewing pertinent diagnostic studies and records.  I reviewed the nurse practitioner's note and agree with the findings and the plan.  The assessment and plan were discussed with the patient.  Multiple questions were asked by the patient and answered.   Nolon Stalls, MD 12/02/2017, 11:42 AM

## 2017-12-07 ENCOUNTER — Other Ambulatory Visit: Payer: Self-pay | Admitting: *Deleted

## 2017-12-08 ENCOUNTER — Telehealth: Payer: Self-pay | Admitting: *Deleted

## 2017-12-08 ENCOUNTER — Encounter: Payer: Self-pay | Admitting: Internal Medicine

## 2017-12-08 ENCOUNTER — Other Ambulatory Visit: Payer: Self-pay | Admitting: Hematology and Oncology

## 2017-12-08 ENCOUNTER — Ambulatory Visit: Payer: PPO | Admitting: Internal Medicine

## 2017-12-08 VITALS — BP 110/84 | HR 87 | Resp 16 | Ht 62.0 in | Wt 111.0 lb

## 2017-12-08 DIAGNOSIS — Z720 Tobacco use: Secondary | ICD-10-CM

## 2017-12-08 DIAGNOSIS — R918 Other nonspecific abnormal finding of lung field: Secondary | ICD-10-CM

## 2017-12-08 MED ORDER — LEVOFLOXACIN 500 MG PO TABS: 500.0000 mg | ORAL_TABLET | Freq: Every day | ORAL | 0 refills | Status: DC

## 2017-12-08 MED ORDER — BENZONATATE 100 MG PO CAPS: 200.0000 mg | ORAL_CAPSULE | Freq: Three times a day (TID) | ORAL | 2 refills | Status: DC

## 2017-12-08 MED ORDER — HYDROCODONE-ACETAMINOPHEN 7.5-325 MG/15ML PO SOLN: 10.0000 mL | Freq: Every day | ORAL | 0 refills | Status: DC

## 2017-12-08 NOTE — Progress Notes (Signed)
[facility]     Assessment and Plan:  Lung mass with mediastinal lymphadenopathy. - Status post EBUS bronchoscopy with fine-needle aspiration of the right paratracheal and subcarinal lymph nodes with good returns, in addition there was transbronchial biopsies of the right lower lobe which for fluoroscopically guided, all biopsies were negative for malignancy. - Discussed with oncology, lung cancer conference, suspicion remains for malignancy, will plan for CT-guided needle biopsy. -If negative will start empiric treatment with steroids and antibiotics for possible organizing pneumonia.  If no response patient will need surgical lung biopsy.  Intractable cough - Suspect  secondary to underlying lung mass.  However may also be secondary to COPD/emphysema, also noted on CT chest. - Continue Spiriva.  Will start Tessalon 3 times daily. - Prescribed narcotic cough medicine to be used at night to help with sleep. - Patient advised that she needs to get a primary care doctor.  Nicotine abuse. - Again discussed the importance of smoke cessation, spent 3 minutes of discussion.  Does not appear that the patient is ready to quit at this time.  She continues to smoke 1 pack of cigarettes per day.   Meds ordered this encounter  Medications  . benzonatate (TESSALON PERLES) 100 MG capsule    Sig: Take 2 capsules (200 mg total) by mouth 3 (three) times daily.    Dispense:  90 capsule    Refill:  2  . HYDROcodone-acetaminophen (HYCET) 7.5-325 mg/15 ml solution    Sig: Take 10-15 mLs by mouth at bedtime for 28 days.    Dispense:  120 mL    Refill:  0   Return in about 1 week (around 12/15/2017) for after lung biopsy.   Date: 12/08/2017  MRN# [id number] Wendy Arias [birth year]  Referring Physician: Dr. Allegra Grana is a 67 y.o. old female seen in consultation for chief complaint of:    Chief Complaint  Patient presents with  . Follow-up    f/u bronch:   . Fever    . Cough    pt needs something for cough. She has been coughing constantly  . Shortness of Breath    with any exertion/talking    HPI:  The patient is a 67 year old female, she was admitted to the hospital on 11/02/2017 for 2 days for acute encephalopathy, possibly related to benzodiazepine and/or alcohol withdrawal.   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 She is an occasional drinker.  She has gained 20 pounds in past few months.  She has never been diagnosed with cancer.   Imaging personally reviewed, CT chest 11/17/2017; right lower lobe opacity, as well as in the base of the right middle lobe, there is also lymphadenopathy at the right paratracheal and subcarinal areas.  Social Hx:   Social History   Tobacco Use  . Smoking status: Current Some Day Smoker    Packs/day: 0.50    Years: 20.00    Pack years: 10.00  . Smokeless tobacco: Never Used  Substance Use Topics  . Alcohol use: Yes    Alcohol/week: 0.6 oz    Types: 1 Glasses of wine per week    Comment: rarely  . Drug use: Never   Medication:    Current Outpatient Medications:  .  alprazolam (XANAX) 2 MG tablet, Take 1 mg by mouth 4 (  four) times daily., Disp: , Rfl: 2 .  benzonatate (TESSALON PERLES) 100 MG capsule, Take 2 capsules (200 mg total) by mouth 3 (three) times daily., Disp: 90 capsule, Rfl: 2 .  busPIRone (BUSPAR) 10 MG tablet, Take 10 mg by mouth 2 (two) times daily., Disp: , Rfl:  .  citalopram (CELEXA) 20 MG tablet, Take 20 mg by mouth daily., Disp: , Rfl:  .  dicyclomine (BENTYL) 10 MG capsule, Take 10 mg by mouth every 6 (six) hours as needed for spasms., Disp: , Rfl:  .  hydrOXYzine (ATARAX/VISTARIL) 25 MG tablet, Take 25 mg by mouth as needed (sleep). 1-4 as needed for sleep, Disp: , Rfl: 0 .  ibandronate (BONIVA) 150 MG tablet, Take 150 mg by mouth every 30 (thirty)  days. Take in the morning with a full glass of water, on an empty stomach, and do not take anything else by mouth or lie down for the next 30 min., Disp: , Rfl:  .  metoprolol succinate (TOPROL-XL) 50 MG 24 hr tablet, Take 50 mg by mouth daily. Take with or immediately following a meal., Disp: , Rfl:  .  simvastatin (ZOCOR) 40 MG tablet, Take 40 mg by mouth daily., Disp: , Rfl:  .  tiotropium (SPIRIVA) 18 MCG inhalation capsule, Place 1 capsule (18 mcg total) into inhaler and inhale daily., Disp: 30 capsule, Rfl: 12 .  venlafaxine XR (EFFEXOR-XR) 75 MG 24 hr capsule, Take 75 mg by mouth daily with breakfast. , Disp: , Rfl:  .  vitamin B-12 (CYANOCOBALAMIN) 1000 MCG tablet, Take 1,000 mcg by mouth daily., Disp: , Rfl:  .  zolpidem (AMBIEN) 10 MG tablet, Take 5-10 mg by mouth at bedtime and may repeat dose one time if needed. 10mg  at bedtime, may repeat 5mg  if needed, Disp: , Rfl: 2   Allergies:  Penicillins  Review of Systems: Gen:  Denies  fever, sweats, chills HEENT: Denies blurred vision, double vision. bleeds, sore throat Cvc:  No dizziness, chest pain. Resp:   Denies cough or sputum production. Gi: Denies swallowing difficulty, stomach pain. Gu:  Denies bladder incontinence, burning urine Ext:   No Joint pain, stiffness. Skin: No skin rash,  hives  Endoc:  No polyuria, polydipsia. Psych: No depression, insomnia. Other:  All other systems were reviewed with the patient and were negative other that what is mentioned in the HPI.   Physical Examination:   VS: BP 110/84 (BP Location: Left Arm, Cuff Size: Normal)   Pulse 87   Resp 16   Ht 5\' 2"  (1.575 m)   Wt 111 lb (50.3 kg)   SpO2 93%   BMI 20.30 kg/m   General Appearance: No distress  Neuro:without focal findings,  speech normal,  HEENT: PERRLA, EOM intact.   Pulmonary: normal breath sounds, No wheezing.  CardiovascularNormal S1,S2.  No m/r/g.   Abdomen: Benign, Soft, non-tender. Renal:  No costovertebral tenderness  GU:   No performed at this time. Endoc: No evident thyromegaly, no signs of acromegaly. Skin:   warm, no rashes, no ecchymosis  Extremities: normal, no cyanosis, clubbing.  Other findings:    LABORATORY PANEL:   CBC No results for input(s): WBC, HGB, HCT, PLT in the last 168 hours. ------------------------------------------------------------------------------------------------------------------  Chemistries  No results for input(s): NA, K, CL, CO2, GLUCOSE, BUN, CREATININE, CALCIUM, MG, AST, ALT, ALKPHOS, BILITOT in the last 168 hours.  Invalid input(s): GFRCGP ------------------------------------------------------------------------------------------------------------------  Cardiac Enzymes No results for input(s): TROPONINI in the last 168 hours. ------------------------------------------------------------  RADIOLOGY:  No results found.     Thank  you for the consultation and for allowing [facility] to assist in the care of your patient. Our recommendations are noted above.  Please contact us if we can be of further service.   Marda Stalker, MD.  Board Certified in Internal Medicine, Pulmonary Medicine, [place], and Sleep Medicine.  [facility] Office Number: [phone]  Patricia Pesa, M.D.  Merton Border, M.D  12/08/2017

## 2017-12-08 NOTE — Patient Instructions (Addendum)
Start tessalon 2 tablets three times daily.  Start mucinex DM three times daily.  Will give you a narcotic cough medicine to take at night.   Make sure you start seeing a primary care doctor.

## 2017-12-08 NOTE — Telephone Encounter (Signed)
Spoke with pt to inform of md recommendations discussed at case conference on 12/08/17. Pt made aware that recommendations are to proceed with CT guided biopsy of the RML lung mass. Order placed for biopsy and checklist has been faxed. Pt informed that will be notified with appt for biopsy once scheduled.   While on the phone pt informed me that she is having worsening cough with intermittent chills. States feels like has a fever but has not check her temp with a thermometer. Pt requested if could get a prescription for antibiotics since has started showing signs of infection. Per Dr. Mike Gip will call in prescription for levaquin shortly. Pt made aware to check with pharmacy in about an hour to see if prescription is ready. Pt verbalized understanding. Nothing further needed at this time.

## 2017-12-09 ENCOUNTER — Encounter: Payer: Self-pay | Admitting: *Deleted

## 2017-12-09 NOTE — Progress Notes (Signed)
  Oncology Nurse Navigator Documentation  Navigator Location: [facility] (12/09/17 1000)   )Navigator Encounter Type: Telephone (12/09/17 1000) Telephone: [facility] Crocker Call (12/09/17 1000)                       Barriers/Navigation Needs: Coordination of Care (12/09/17 1000)   Interventions: Coordination of Care (12/09/17 1000)   Coordination of Care: Appts (12/09/17 1000)     spoke with patient to inform her of her biopsy that is scheduled on Tues 6/25 at 9am with 8am arrival at the medical mall to check in. Informed that is scheduled to follow up with Dr. Mike Gip on Mon 7/1 at 3:30pm. Pt verbalized understanding. No further questions or needs at this time. Appts have been mailed to patient as well.             Time Spent with Patient: 30 (12/09/17 1000)

## 2017-12-15 ENCOUNTER — Other Ambulatory Visit: Payer: Self-pay | Admitting: Hematology and Oncology

## 2017-12-16 DIAGNOSIS — I10 Essential (primary) hypertension: Secondary | ICD-10-CM | POA: Insufficient documentation

## 2017-12-16 DIAGNOSIS — R918 Other nonspecific abnormal finding of lung field: Secondary | ICD-10-CM | POA: Diagnosis not present

## 2017-12-16 DIAGNOSIS — R05 Cough: Secondary | ICD-10-CM | POA: Diagnosis not present

## 2017-12-19 ENCOUNTER — Other Ambulatory Visit: Payer: Self-pay | Admitting: Radiology

## 2017-12-19 LAB — CULTURE, FUNGUS WITHOUT SMEAR: SPECIAL REQUESTS: NORMAL

## 2017-12-20 ENCOUNTER — Other Ambulatory Visit (HOSPITAL_COMMUNITY): Payer: Self-pay | Admitting: Diagnostic Radiology

## 2017-12-20 ENCOUNTER — Other Ambulatory Visit: Payer: Self-pay | Admitting: Hematology and Oncology

## 2017-12-20 ENCOUNTER — Ambulatory Visit
Admission: RE | Admit: 2017-12-20 | Discharge: 2017-12-20 | Disposition: A | Discharge status: Home / Self Care | Payer: PPO | Source: Ambulatory Visit | Attending: Hematology and Oncology | Admitting: Hematology and Oncology

## 2017-12-20 ENCOUNTER — Telehealth: Payer: Self-pay | Admitting: *Deleted

## 2017-12-20 DIAGNOSIS — K7689 Other specified diseases of liver: Secondary | ICD-10-CM | POA: Diagnosis not present

## 2017-12-20 DIAGNOSIS — F329 Major depressive disorder, single episode, unspecified: Secondary | ICD-10-CM | POA: Diagnosis not present

## 2017-12-20 DIAGNOSIS — K5730 Diverticulosis of large intestine without perforation or abscess without bleeding: Secondary | ICD-10-CM | POA: Insufficient documentation

## 2017-12-20 DIAGNOSIS — R918 Other nonspecific abnormal finding of lung field: Secondary | ICD-10-CM | POA: Diagnosis not present

## 2017-12-20 DIAGNOSIS — M810 Age-related osteoporosis without current pathological fracture: Secondary | ICD-10-CM | POA: Insufficient documentation

## 2017-12-20 DIAGNOSIS — F17210 Nicotine dependence, cigarettes, uncomplicated: Secondary | ICD-10-CM | POA: Insufficient documentation

## 2017-12-20 DIAGNOSIS — J45909 Unspecified asthma, uncomplicated: Secondary | ICD-10-CM | POA: Insufficient documentation

## 2017-12-20 DIAGNOSIS — I252 Old myocardial infarction: Secondary | ICD-10-CM | POA: Diagnosis not present

## 2017-12-20 DIAGNOSIS — Z79899 Other long term (current) drug therapy: Secondary | ICD-10-CM | POA: Diagnosis not present

## 2017-12-20 DIAGNOSIS — F419 Anxiety disorder, unspecified: Secondary | ICD-10-CM | POA: Diagnosis not present

## 2017-12-20 DIAGNOSIS — I10 Essential (primary) hypertension: Secondary | ICD-10-CM | POA: Insufficient documentation

## 2017-12-20 DIAGNOSIS — J181 Lobar pneumonia, unspecified organism: Secondary | ICD-10-CM | POA: Diagnosis not present

## 2017-12-20 DIAGNOSIS — I700 Atherosclerosis of aorta: Secondary | ICD-10-CM | POA: Insufficient documentation

## 2017-12-20 LAB — CBC
HCT: 33.9 % — ABNORMAL LOW (ref 35.0–47.0)
Hemoglobin: 11.4 g/dL — ABNORMAL LOW (ref 12.0–16.0)
MCH: 30.7 pg (ref 26.0–34.0)
MCHC: 33.5 g/dL (ref 32.0–36.0)
MCV: 91.5 fL (ref 80.0–100.0)
Platelets: 517 10*3/uL — ABNORMAL HIGH (ref 150–440)
RBC: 3.71 MIL/uL — ABNORMAL LOW (ref 3.80–5.20)
RDW: 18 % — AB (ref 11.5–14.5)
WBC: 8.7 10*3/uL (ref 3.6–11.0)

## 2017-12-20 LAB — PROTIME-INR
INR: 1
PROTHROMBIN TIME: 13.1 s (ref 11.4–15.2)

## 2017-12-20 MED ORDER — SODIUM CHLORIDE 0.9 % IV SOLN
INTRAVENOUS | Status: DC
  Administered 2017-12-20: 09:00:00 via INTRAVENOUS

## 2017-12-20 MED ORDER — MIDAZOLAM HCL 5 MG/5ML IJ SOLN
INTRAMUSCULAR | Status: AC
  Filled 2017-12-20: qty 5

## 2017-12-20 MED ORDER — FENTANYL CITRATE (PF) 100 MCG/2ML IJ SOLN
INTRAMUSCULAR | Status: AC
  Filled 2017-12-20: qty 4

## 2017-12-20 MED ORDER — LEVOFLOXACIN 500 MG PO TABS: 500.0000 mg | ORAL_TABLET | Freq: Every day | ORAL | 0 refills | Status: DC

## 2017-12-20 MED ORDER — IOPAMIDOL (ISOVUE-300) INJECTION 61%
75.0000 mL | Freq: Once | INTRAVENOUS | Status: AC | PRN
  Administered 2017-12-20: 75 mL via INTRAVENOUS

## 2017-12-20 NOTE — Telephone Encounter (Signed)
Pt scheduled to f/u with Dr. Juanell Fairly on [place] 6/27 at 9:45am, arrive at 9:30am. Called pt and left message with appt details. Instructed to call back with any further needs. Appt with Dr. Mike Gip on Mon 7/1 has been cancelled per pt request as well.

## 2017-12-20 NOTE — Telephone Encounter (Signed)
Pt called to inform that biopsy was not performed today due to improving consolidation in right middle and right lower lobes since completing antibiotics. Pt questioned if needed to be put back on antibiotics and if needs to take steroids to help with the inflammation. Per Dr. Mike Gip may refill antibiotic prescription for levaquin but will need to follow up with pulmonary for further management of resolving infection with steroids. Message left with patient regarding MD recommendations.

## 2017-12-20 NOTE — Discharge Instructions (Signed)
Moderate Conscious Sedation, Adult, Care After °These instructions provide you with information about caring for yourself after your procedure. Your health care provider may also give you more specific instructions. Your treatment has been planned according to current medical practices, but problems sometimes occur. Call your health care provider if you have any problems or questions after your procedure. °What can I expect after the procedure? °After your procedure, it is common: °· To feel sleepy for several hours. °· To feel clumsy and have poor balance for several hours. °· To have poor judgment for several hours. °· To vomit if you eat too soon. ° °Follow these instructions at home: °For at least 24 hours after the procedure: ° °· Do not: °? Participate in activities where you could fall or become injured. °? Drive. °? Use heavy machinery. °? Drink alcohol. °? Take sleeping pills or medicines that cause drowsiness. °? Make important decisions or sign legal documents. °? Take care of children on your own. °· Rest. °Eating and drinking °· Follow the diet recommended by your health care provider. °· If you vomit: °? Drink water, juice, or soup when you can drink without vomiting. °? Make sure you have little or no nausea before eating solid foods. °General instructions °· Have a responsible adult stay with you until you are awake and alert. °· Take over-the-counter and prescription medicines only as told by your health care provider. °· If you smoke, do not smoke without supervision. °· Keep all follow-up visits as told by your health care provider. This is important. °Contact a health care provider if: °· You keep feeling nauseous or you keep vomiting. °· You feel light-headed. °· You develop a rash. °· You have a fever. °Get help right away if: °· You have trouble breathing. °This information is not intended to replace advice given to you by your health care provider. Make sure you discuss any questions you have  with your health care provider. °Document Released: 04/04/2013 Document Revised: 11/17/2015 Document Reviewed: 10/04/2015 °Elsevier Interactive Patient Education © 2018 [employer] °Lung Biopsy, Care After °This sheet gives you information about how to care for yourself after your procedure. Your health care provider may also give you more specific instructions depending on the type of biopsy you had. If you have problems or questions, contact your health care provider. °What can I expect after the procedure? °After the procedure, it is common to have: °· A cough. °· A sore throat. °· Pain where a needle, bronchoscope, or incision was used to collect a biopsy sample (biopsy site). ° °Follow these instructions at home: °Medicines °· Take over-the-counter and prescription medicines only as told by your health care provider. °· Do not drive for 24 hours if you were given a sedative. °· Do not drink alcohol while taking pain medicine. °· Do not drive or use heavy machinery while taking prescription pain medicine. °· To prevent or treat constipation while you are taking prescription pain medicine, your health care provider may recommend that you: °? Drink enough fluid to keep your urine clear or pale yellow. °? Take over-the-counter or prescription medicines. °? Eat foods that are high in fiber, such as fresh fruits and vegetables, whole grains, and beans. °? Limit foods that are high in fat and processed sugars, such as fried and sweet foods. °Activity °· If you had an incision during your procedure, avoid activities that may pull the incision site open. °· Return to your normal activities as told by your health   care provider. Ask your health care provider what activities are safe for you. °If you had an open biopsy:  °· Follow instructions from your health care provider about how to take care of your incision. Make sure you: °? Wash your hands with soap and water before you change your bandage (dressing). If soap and  water are not available, use hand sanitizer. °? Change your dressing as told by your health care provider. °? Leave stitches (sutures), skin glue, or adhesive strips in place. These skin closures may need to stay in place for 2 weeks or longer. If adhesive strip edges start to loosen and curl up, you may trim the loose edges. Do not remove adhesive strips completely unless your health care provider tells you to do that. °· Check your incision area every day for signs of infection. Check for: °? Redness, swelling, or pain. °? Fluid or blood. °? Warmth. °? Pus or a bad smell. °General instructions °· It is up to you to get the results of your procedure. Ask your health care provider, or the department that is doing the procedure, when your results will be ready. °Contact a health care provider if: °· You have a fever. °· You have redness, swelling, or pain around your biopsy site. °· You have fluid or blood coming from your biopsy site. °· Your biopsy site feels warm to the touch. °· You have pus or a bad smell coming from your biopsy site. °Get help right away if: °· You cough up blood. °· You have trouble breathing. °· You have chest pain. °Summary °· After the procedure, it is common to have a sore throat and a cough. °· Return to your normal activities as told by your health care provider. Ask your health care provider what activities are safe for you. °· Take over-the-counter and prescription medicines only as told by your health care provider. °· Report any unusual symptoms to your health care provider. °This information is not intended to replace advice given to you by your health care provider. Make sure you discuss any questions you have with your health care provider. °Document Released: 07/13/2016 Document Revised: 07/13/2016 Document Reviewed: 07/13/2016 °Elsevier Interactive Patient Education © 2018 [employer] ° °

## 2017-12-20 NOTE — Progress Notes (Signed)
Patient clinically stable, after repeat ct scan per Dr Anselm Pancoast with contrast, it was decided that no ct lung biopsy necessary at this time. Patient's sister at bedside. Discharged at this time.

## 2017-12-20 NOTE — H&P (Signed)
Chief Complaint: Patient was seen in consultation today for CT-guided lung lesion biopsy at the request of Bandera C  Referring Physician(s): Orbisonia C  Patient Status: [facility] - Out-pt  History of Present Illness: NGA Wendy Arias is a 67 y.o. female with history of intractable cough and found to have a suspicious findings on cross-sectional imaging and PET/CT.  Patient underwent EBUS bronchoscopy of mediastinal lymph nodes and transbronchial biopsy of the right lower lobe.  These biopsies were negative for malignancy.  There is still concern for an underlying neoplasm based on the imaging.  There is a peripheral area of consolidation and hypermetabolic activity along the base of the right middle lobe which is approachable from a CT-guided standpoint.  Patient presents for CT-guided biopsy of this area.  Patient continues to have a chronic cough but denies hemoptysis.  She has no other breathing problems.  She denies chest pain.  No abdominal pains.  No problems with her urinary tract or bowels.  Patient is still smoking.  Past Medical History:  Diagnosis Date  . Anxiety   . Asthma   . Depression   . Hypertension   . Myocardial infarction ([place])   . Osteoporosis     Past Surgical History:  Procedure Laterality Date  . ABDOMINAL HYSTERECTOMY     partial  . CARDIAC CATHETERIZATION    . CARDIAC SURGERY    . COLONOSCOPY WITH PROPOFOL N/A 04/12/2017   Procedure: COLONOSCOPY WITH PROPOFOL;  Surgeon: Lollie Sails, MD;  Location: [facility] ENDOSCOPY;  Service: Endoscopy;  Laterality: N/A;  . CORONARY ANGIOPLASTY    . ENDOBRONCHIAL ULTRASOUND N/A 11/28/2017   Procedure: ENDOBRONCHIAL ULTRASOUND;  Surgeon: Laverle Hobby, MD;  Location: [facility] ORS;  Service: Pulmonary;  Laterality: N/A;  . REDUCTION MAMMAPLASTY      Allergies: Penicillins  Medications: Prior to Admission medications   Medication Sig Start Date End Date Taking? Authorizing Provider  alprazolam  Duanne Moron) 2 MG tablet Take 1 mg by mouth 4 (four) times daily. 10/05/17  Yes [provider]  benzonatate (TESSALON PERLES) 100 MG capsule Take 2 capsules (200 mg total) by mouth 3 (three) times daily. 11/22/17 11/22/18 Yes Laverle Hobby, MD  benzonatate (TESSALON PERLES) 100 MG capsule Take 2 capsules (200 mg total) by mouth 3 (three) times daily. 12/08/17 12/08/18 Yes Laverle Hobby, MD  busPIRone (BUSPAR) 10 MG tablet Take 10 mg by mouth 2 (two) times daily. 11/01/17  Yes [provider]  citalopram (CELEXA) 20 MG tablet Take 20 mg by mouth daily. 11/01/17  Yes [provider]  dicyclomine (BENTYL) 10 MG capsule Take 10 mg by mouth every 6 (six) hours as needed for spasms. 11/01/17  Yes [provider]  HYDROcodone-acetaminophen (HYCET) 7.5-325 mg/15 ml solution Take 10-15 mLs by mouth at bedtime for 28 days. 12/08/17 01/05/18 Yes Laverle Hobby, MD  hydrOXYzine (ATARAX/VISTARIL) 25 MG tablet Take 25 mg by mouth as needed (sleep). 1-4 as needed for sleep 11/10/17  Yes [provider]  ibandronate (BONIVA) 150 MG tablet Take 150 mg by mouth every 30 (thirty) days. Take in the morning with a full glass of water, on an empty stomach, and do not take anything else by mouth or lie down for the next 30 min.   Yes [provider]  metoprolol succinate (TOPROL-XL) 50 MG 24 hr tablet Take 50 mg by mouth daily. Take with or immediately following a meal.   Yes [provider]  simvastatin (ZOCOR) 40 MG tablet Take 40 mg by  mouth daily.   Yes [provider]  tiotropium (SPIRIVA) 18 MCG inhalation capsule Place 1 capsule (18 mcg total) into inhaler and inhale daily. 11/22/17 12/22/17 Yes Laverle Hobby, MD  venlafaxine XR (EFFEXOR-XR) 75 MG 24 hr capsule Take 75 mg by mouth daily with breakfast.    Yes [provider]  vitamin B-12 (CYANOCOBALAMIN) 1000 MCG tablet Take 1,000 mcg by mouth daily.   Yes [provider]  zolpidem (AMBIEN) 10 MG tablet Take 5-10 mg by mouth at bedtime and may repeat dose one time if needed. 10mg  at bedtime, may repeat 5mg  if needed 10/05/17  Yes [provider]     Family History  Problem Relation Age of Onset  . Breast cancer Mother 30  . Hypertension Mother   . Dementia Mother   . Breast cancer Maternal Aunt 70  . Breast cancer Other 35  . Hypertension Father   . Heart disease Father   . Diabetes Father   . Diabetes Sister     Social History   Socioeconomic History  . Marital status: Divorced    Spouse name: Not on file  . Number of children: Not on file  . Years of education: Not on file  . Highest education level: Not on file  Occupational History  . Not on file  Social Needs  . Financial resource strain: Not on file  . Food insecurity:    Worry: Not on file    Inability: Not on file  . Transportation needs:    Medical: Not on file    Non-medical: Not on file  Tobacco Use  . Smoking status: Current Some Day Smoker    Packs/day: 0.50    Years: 20.00    Pack years: 10.00  . Smokeless tobacco: Never Used  Substance and Sexual Activity  . Alcohol use: Yes    Alcohol/week: 0.6 oz    Types: 1 Glasses of wine per week    Comment: rarely  . Drug use: Never  . Sexual activity: Not on file  Lifestyle  . Physical activity:    Days per week: Not on file    Minutes per session: Not on file  . Stress: Not on file  Relationships  . Social connections:    Talks on phone: Not on file    Gets together: Not on file    Attends religious service: Not on file    Active member of club or organization: Not on file    Attends meetings of clubs or organizations: Not on file    Relationship status: Not on file  Other Topics Concern  . Not on file  Social History Narrative  . Not on file    Review of Systems: A 12 point ROS discussed and pertinent positives are indicated in the HPI above.  All other systems are negative.  Review of Systems    Constitutional: Negative for activity change and unexpected weight change.  Respiratory: Positive for cough.   Cardiovascular: Negative for chest pain.  Gastrointestinal: Negative.  Negative for abdominal distention and abdominal pain.  Genitourinary: Negative.     Vital Signs: BP 121/86   Pulse 63   Temp 97.9 F (36.6 C) (Oral)   Resp 20   Ht 5\' 2"  (1.575 m)   Wt 111 lb (50.3 kg)   SpO2 98%   BMI 20.30 kg/m   Physical Exam  Constitutional: She is oriented to person, place, and time. No distress.  HENT:  Mouth/Throat: Oropharynx is clear  and moist.  Cardiovascular: Normal rate, regular rhythm and normal heart sounds.  Pulmonary/Chest:  Normal breath sounds in the left chest.  Decreased breath sounds in the right chest and this is compatible with previous imaging findings.  Abdominal: Soft. Bowel sounds are normal. She exhibits no distension. There is no tenderness.  Neurological: She is alert and oriented to person, place, and time.    Imaging: Dg Chest 1 View  Result Date: 11/28/2017 CLINICAL DATA:  Post endobronchial ultrasound and RIGHT lung biopsy. EXAM: CHEST  1 VIEW COMPARISON:  11/22/2017 PET CT, 11/17/2017 chest CT 09/14/2016 chest radiograph and prior studies FINDINGS: RIGHT LOWER lung opacities have increased since recent PET-CT and compatible with known consolidation/mass with increasing airspace disease/atelectasis. There may be a small RIGHT pleural effusion present. There is no evidence of pneumothorax. Cardiomediastinal silhouette is unchanged. IMPRESSION: Increased RIGHT LOWER lung opacities compatible with known consolidation/mass with increasing airspace disease/atelectasis/post biopsy changes. No pneumothorax. Question small RIGHT pleural effusion. Electronically Signed   By: Margarette Canada M.D.   On: 11/28/2017 15:15   Nm Bone Scan Whole Body  Result Date: 11/30/2017 CLINICAL DATA:  RIGHT lung masses in RIGHT middle and RIGHT lower lobes EXAM: NUCLEAR MEDICINE  WHOLE BODY BONE SCAN TECHNIQUE: Whole body anterior and posterior images were obtained approximately 3 hours after intravenous injection of radiopharmaceutical. RADIOPHARMACEUTICALS:  22.35 mCi Technetium-62m MDP IV COMPARISON:  None Correlation: PET-CT 11/22/2017, CT chest 11/17/2017 FINDINGS: Single focus of abnormal increased tracer localization at an anterior lower LEFT rib approximately seventh, corresponding to a healing fracture on prior CT chest. Minimal uptake at the shoulders, typically degenerative. No additional sites of abnormal osseous tracer accumulation are identified to suggest osseous metastatic disease. Expected urinary tract and soft tissue distribution of tracer. IMPRESSION: No scintigraphic evidence of osseous metastatic disease. Healing fracture anterior LEFT seventh rib. Electronically Signed   By: Lavonia Dana M.D.   On: 11/30/2017 16:53   Nm Pet Image Initial (pi) Skull Base To Thigh  Result Date: 11/22/2017 CLINICAL DATA:  Initial treatment strategy for right middle lobe and right lower lobe masslike opacities. EXAM: NUCLEAR MEDICINE PET SKULL BASE TO THIGH TECHNIQUE: 6.5 mCi F-18 FDG was injected intravenously. Full-ring PET imaging was performed from the skull base to thigh after the radiotracer. CT data was obtained and used for attenuation correction and anatomic localization. Fasting blood glucose: 84 mg/dl COMPARISON:  11/17/2017 chest CT. FINDINGS: Mediastinal blood pool activity: SUV max 2.5 NECK: No hypermetabolic lymph nodes in the neck. Incidental CT findings: none CHEST: Hypermetabolic masslike consolidation in medial right middle lobe measuring 4.6 x 4.0 cm with max SUV 8.8 (series 4/image 105), not appreciably changed since 11/17/2017 chest CT. Hypermetabolic masslike consolidation in the anterior right lower lobe measuring 6.4 x 4.8 cm with max SUV 7.7 (series 4/image 105), not appreciably changed since 11/17/2017 chest CT. Hypermetabolic irregular nodular focus of  consolidation in the peripheral right middle lobe with max SUV 3.3 (series 4/image 93), not appreciably changed since 11/17/2017 chest CT. Hypermetabolic right hilar adenopathy with max SUV 3.7. Hypermetabolic enlarged 1.3 cm subcarinal node with max SUV 4.5 (series 4/image 79). Hypermetabolic enlarged 1.6 cm right paratracheal node with max SUV 4.3 (series 4/image 74). No hypermetabolic left mediastinal or left hilar nodes. No hypermetabolic axillary adenopathy. Incidental CT findings: Trace dependent right pleural effusion. Atherosclerotic nonaneurysmal thoracic aorta. ABDOMEN/PELVIS: No abnormal hypermetabolic activity within the liver, pancreas, adrenal glands, or spleen. No hypermetabolic lymph nodes in the abdomen or pelvis. Incidental CT findings:  Simple 3.6 cm lateral segment left liver lobe cyst. Mild sigmoid diverticulosis. Atherosclerotic abdominal aorta with ectatic 2.5 cm infrarenal abdominal aorta. SKELETON: No focal hypermetabolic activity to suggest skeletal metastasis. Incidental CT findings: none IMPRESSION: 1. Large hypermetabolic masslike foci of consolidation in the medial right middle lobe and anterior right lower lobe. Hypermetabolic irregular nodular focus of consolidation in the peripheral right middle lobe. These findings are not appreciably changed since the recent 11/17/2017 chest CT study and are nonspecific. Primary bronchogenic malignancy is certainly of concern, although a multilobar pneumonia cannot be excluded. Tissue sampling may be obtained at this time versus short-term follow-up post treatment chest CT with IV contrast after a trial of antibiotic therapy, depending on level of clinical concern for malignancy. 2. Hypermetabolic right hilar, subcarinal and right paratracheal adenopathy, nonspecific, metastatic disease not excluded. 3. No extrathoracic or skeletal hypermetabolic disease. 4. Trace dependent right pleural effusion. 5.  Aortic Atherosclerosis (ICD10-I70.0).  Electronically Signed   By: Ilona Sorrel M.D.   On: 11/22/2017 12:04   Dg C-arm 1-60 Min-no Report  Result Date: 11/28/2017 Fluoroscopy was utilized by the requesting physician.  No radiographic interpretation.    Labs:  CBC: Recent Labs    11/02/17 2343 11/03/17 0430 12/20/17 0834  WBC 5.3 5.0 8.7  HGB 12.5 11.9* 11.4*  HCT 36.6 35.4 33.9*  PLT 332 341 517*    COAGS: Recent Labs    11/02/17 2343 11/22/17 1330 12/20/17 0834  INR 0.93 1.11 1.00    BMP: Recent Labs    11/02/17 2343 11/03/17 0430  NA 141 139  K 3.7 3.6  CL 107 106  CO2 25 23  GLUCOSE 148* 166*  BUN 17 21*  CALCIUM 8.9 8.6*  CREATININE 1.33* 1.02*  GFRNONAA 40* 56*  GFRAA 47* >60    LIVER FUNCTION TESTS: Recent Labs    11/02/17 2343  BILITOT 0.4  AST 30  ALT 22  ALKPHOS 134*  PROT 7.1  ALBUMIN 3.9    TUMOR MARKERS: No results for input(s): AFPTM, CEA, CA199, CHROMGRNA in the last 8760 hours.  Assessment and Plan:  66 year old smoker with consolidation in the right lower lung and suspicious areas of hypermetabolic activity within the right lung base and mediastinal lymph nodes.  Previous bronchoscopic biopsies were negative for malignancy.  Patient presents for a CT-guided biopsy of the peripheral right middle lobe lesion.  There is consolidated tissue at the base of the right middle lobe and not clear if this represents a true lesion versus postobstructive changes.  However, this area is very hypermetabolic and approachable for CT-guided biopsy.  The risk of the procedure including pneumothorax and hemoptysis were explained to the patient.  She has a good understanding of the risks and benefits of the procedure.  Patient also understands that if the biopsy is negative for malignancy she may require additional procedures to exclude malignancy such as an open biopsy.  Informed consent was obtained for CT-guided biopsy of the right middle lobe lesion with moderate sedation.  Thank you for  this interesting consult.  I greatly enjoyed meeting WILMARIE SPARLIN and look forward to participating in their care.  A copy of this report was sent to the requesting provider on this date.  Electronically Signed: Burman Riis, MD 12/20/2017, 9:12 AM   I spent a total of  15 Minutes   in face to face in clinical consultation, greater than 50% of which was counseling/coordinating care for CT-guided lung biopsy.

## 2017-12-22 ENCOUNTER — Ambulatory Visit (INDEPENDENT_AMBULATORY_CARE_PROVIDER_SITE_OTHER): Payer: PPO | Admitting: Internal Medicine

## 2017-12-22 ENCOUNTER — Encounter: Payer: Self-pay | Admitting: Internal Medicine

## 2017-12-22 VITALS — BP 110/80 | HR 75 | Resp 16 | Ht 62.0 in | Wt 110.0 lb

## 2017-12-22 DIAGNOSIS — R918 Other nonspecific abnormal finding of lung field: Secondary | ICD-10-CM | POA: Diagnosis not present

## 2017-12-22 DIAGNOSIS — J8489 Other specified interstitial pulmonary diseases: Secondary | ICD-10-CM | POA: Diagnosis not present

## 2017-12-22 DIAGNOSIS — G4719 Other hypersomnia: Secondary | ICD-10-CM | POA: Diagnosis not present

## 2017-12-22 DIAGNOSIS — F17210 Nicotine dependence, cigarettes, uncomplicated: Secondary | ICD-10-CM

## 2017-12-22 MED ORDER — PREDNISONE 10 MG PO TABS: 10.0000 mg | ORAL_TABLET | Freq: Every day | ORAL | 0 refills | Status: DC

## 2017-12-22 MED ORDER — HYDROCODONE-ACETAMINOPHEN 7.5-325 MG/15ML PO SOLN
10.0000 mL | Freq: Every day | ORAL | 0 refills | Status: AC
Start: 2017-12-22 — End: 2018-01-19

## 2017-12-22 NOTE — Patient Instructions (Signed)
Will send for sleep study.   I prescribed a one-month course of prednisone.  We are also ordering blood work.   Sleep Apnea    Sleep apnea is disorder that affects a person's sleep. A person with sleep apnea has abnormal pauses in their breathing when they sleep. It is hard for them to get a good sleep. This makes a person tired during the day. It also can lead to other physical problems. There are three types of sleep apnea. One type is when breathing stops for a short time because your airway is blocked (obstructive sleep apnea). Another type is when the brain sometimes fails to give the normal signal to breathe to the muscles that control your breathing (central sleep apnea). The third type is a combination of the other two types.  HOME CARE   Take all medicine as told by your doctor.  Avoid alcohol, calming medicines (sedatives), and depressant drugs.  Try to lose weight if you are overweight. Talk to your doctor about a healthy weight goal.  Your doctor may have you use a device that helps to open your airway. It can help you get the air that you need. It is called a positive airway pressure (PAP) device.   MAKE SURE YOU:   Understand these instructions.  Will watch your condition.  Will get help right away if you are not doing well or get worse.  It may take approximately 1 month for you to get used to wearing her CPAP every night.  Be sure to work with your machine to get used to it, be patient, it may take time!  If you have trouble tolerating CPAP DO NOT RETURN YOUR MACHINE; Contact our office to see if we can help you tolerate the CPAP better first!

## 2017-12-22 NOTE — Progress Notes (Signed)
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  - Status post EBUS bronchoscopy with fine-needle aspiration of the right paratracheal and subcarinal lymph nodes with good returns, in addition there was transbronchial biopsies of the right lower lobe which for fluoroscopically guided, all biopsies were negative for malignancy. 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 Will give course of steroid for 4 weeks. Check serologies.   Intractable cough - Suspect  secondary to underlying lung mass.   - Prescribed narcotic cough medicine to be used at night to help with sleep. - Patient advised that she needs to get a primary care doctor.  Nicotine abuse. - Again discussed the importance of smoke cessation, spent 3 minutes of discussion.  Does not appear that the patient is ready to quit at this time.  She continues to smoke   Meds ordered this encounter  Medications  . HYDROcodone-acetaminophen (HYCET) 7.5-325 mg/15 ml solution    Sig: Take 10-15 mLs by mouth at bedtime for 28 days.    Dispense:  120 mL    Refill:  0  . predniSONE (DELTASONE) 10 MG tablet    Sig: Take 1 tablet (10 mg total) by mouth daily with breakfast. Take 4 tablets daily for 7 days, then Take 3 tablets daily for 7 days, then Take 2 tablets daily for 7 days, then Take 1 tablet daily for 7 days then stop.    Dispense:  75 tablet    Refill:  0   Meds ordered this encounter  Medications  . HYDROcodone-acetaminophen (HYCET) 7.5-325 mg/15 ml solution    Sig: Take 10-15 mLs by mouth at bedtime for 28 days.    Dispense:  120 mL    Refill:  0  . predniSONE (DELTASONE) 10 MG tablet    Sig: Take 1 tablet (10 mg total) by mouth daily with breakfast. Take 4 tablets daily for 7 days, then Take 3 tablets daily for 7 days, then Take 2 tablets daily for 7 days, then Take 1 tablet daily for 7 days then stop.    Dispense:  75 tablet    Refill:  0     Return in about 3 months (around 03/24/2018).   Date: 12/22/2017  MRN# [id number] Wendy Arias Nov 20, 1950  Referring Physician: Dr. Allegra Grana is a 67 y.o. old female seen in consultation for chief complaint of:    Chief Complaint  Patient presents with  . Lung Mass    pt c/o extreme fatigue  . Cough  . Fatigue    HPI:  The patient is a 67 year old female, she was admitted to the hospital on 11/02/2017 for 2 days for acute encephalopathy, possibly related to benzodiazepine and/or alcohol withdrawal.   Today she continues to have occasional pain in her right shoulder and right jaw. She is tired much of the time, she is sleepy and snores at night. She had a sleep study about 10 years ago and was negative.  She notes improvement in her pain and fatigue when she was on steroids and antibiotics.   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 She is an occasional drinker.  She has gained 20 pounds in past few months.  She has never been diagnosed with cancer.   **CT chest 11/17/2017 in comparison with subsequent scan on 12/20/2017; right lower lobe opacity, as well as in the base of the right middle lobe, there is  also lymphadenopathy at the right paratracheal and subcarinal areas.  Lymphadenopathy appears relatively unchanged, there is migratory infiltrate with new infiltrate/areas of consolidation in the periphery of the right mid zone, other previously changed areas appear improved or relatively unchanged.  The previously seen pleural effusion have resolved  Social Hx:   Social History   Tobacco Use  . Smoking status: Current Some Day Smoker    Packs/day: 0.50    Years: 20.00    Pack years: 10.00  . Smokeless tobacco: Never Used  Substance Use Topics  . Alcohol use: Yes    Alcohol/week: 0.6 oz    Types: 1 Glasses of wine per week    Comment: rarely  . Drug use: Never   Medication:    Current Outpatient Medications:  .  alprazolam (XANAX) 2 MG  tablet, Take 1 mg by mouth 4 (four) times daily., Disp: , Rfl: 2 .  busPIRone (BUSPAR) 10 MG tablet, Take 10 mg by mouth 2 (two) times daily., Disp: , Rfl:  .  citalopram (CELEXA) 20 MG tablet, Take 20 mg by mouth daily., Disp: , Rfl:  .  dicyclomine (BENTYL) 10 MG capsule, Take 10 mg by mouth every 6 (six) hours as needed for spasms., Disp: , Rfl:  .  HYDROcodone-acetaminophen (HYCET) 7.5-325 mg/15 ml solution, Take 10-15 mLs by mouth at bedtime for 28 days., Disp: 120 mL, Rfl: 0 .  hydrOXYzine (ATARAX/VISTARIL) 25 MG tablet, Take 25 mg by mouth as needed (sleep). 1-4 as needed for sleep, Disp: , Rfl: 0 .  ibandronate (BONIVA) 150 MG tablet, Take 150 mg by mouth every 30 (thirty) days. Take in the morning with a full glass of water, on an empty stomach, and do not take anything else by mouth or lie down for the next 30 min., Disp: , Rfl:  .  levofloxacin (LEVAQUIN) 500 MG tablet, Take 1 tablet (500 mg total) by mouth daily., Disp: 10 tablet, Rfl: 0 .  metoprolol succinate (TOPROL-XL) 50 MG 24 hr tablet, Take 50 mg by mouth daily. Take with or immediately following a meal., Disp: , Rfl:  .  simvastatin (ZOCOR) 40 MG tablet, Take 40 mg by mouth daily., Disp: , Rfl:  .  tiotropium (SPIRIVA) 18 MCG inhalation capsule, Place 1 capsule (18 mcg total) into inhaler and inhale daily., Disp: 30 capsule, Rfl: 12 .  venlafaxine XR (EFFEXOR-XR) 75 MG 24 hr capsule, Take 75 mg by mouth daily with breakfast. , Disp: , Rfl:  .  vitamin B-12 (CYANOCOBALAMIN) 1000 MCG tablet, Take 1,000 mcg by mouth daily., Disp: , Rfl:  .  zolpidem (AMBIEN) 10 MG tablet, Take 5-10 mg by mouth at bedtime and may repeat dose one time if needed. 10mg  at bedtime, may repeat 5mg  if needed, Disp: , Rfl: 2   Allergies:  Penicillins  Review of Systems: Gen:  Denies  fever, sweats, chills HEENT: Denies blurred vision, double vision. bleeds, sore throat Cvc:  No dizziness, chest pain. Resp:   Denies cough or sputum production. Gi:  Denies swallowing difficulty, stomach pain. Gu:  Denies bladder incontinence, burning urine Ext:   No Joint pain, stiffness. Skin: No skin rash,  hives  Endoc:  No polyuria, polydipsia. Psych: No depression, insomnia. Other:  All other systems were reviewed with the patient and were negative other that what is mentioned in the HPI.   Physical Examination:   VS: BP 110/80 (BP Location: Left Arm, Cuff Size: Normal)   Pulse 75   Resp 16  Ht 5\' 2"  (1.575 m)   Wt 110 lb (49.9 kg)   SpO2 94%   BMI 20.12 kg/m   General Appearance: No distress  Neuro:without focal findings,  speech normal,  HEENT: PERRLA, EOM intact.   Pulmonary: normal breath sounds, No wheezing.  CardiovascularNormal S1,S2.  No m/r/g.   Abdomen: Benign, Soft, non-tender. Renal:  No costovertebral tenderness  GU:  No performed at this time. Endoc: No evident thyromegaly, no signs of acromegaly. Skin:   warm, no rashes, no ecchymosis  Extremities: normal, no cyanosis, clubbing.  Other findings:    LABORATORY PANEL:   CBC Recent Labs  Lab 12/20/17 0834  WBC 8.7  HGB 11.4*  HCT 33.9*  PLT 517*   ------------------------------------------------------------------------------------------------------------------  Chemistries  No results for input(s): NA, K, CL, CO2, GLUCOSE, BUN, CREATININE, CALCIUM, MG, AST, ALT, ALKPHOS, BILITOT in the last 168 hours.  Invalid input(s): GFRCGP ------------------------------------------------------------------------------------------------------------------  Cardiac Enzymes No results for input(s): TROPONINI in the last 168 hours. ------------------------------------------------------------  RADIOLOGY:  Ct Chest W Contrast  Result Date: 12/20/2017 CLINICAL DATA:  67 year old with suspicious lesions at the right lung base and chest lymphadenopathy. Previous bronchoscopic biopsies were negative for malignancy. Patient presents for CT-guided biopsy the right middle lobe  lesion. Initial CT images of the chest were obtained for the biopsy. The disease at the right lung base appeared to be decreased compared to the previous examination. Therefore, a diagnostic study with IV contrast was performed in order to better characterize the right lung disease. EXAM: CT CHEST WITH CONTRAST TECHNIQUE: Multidetector CT imaging of the chest was performed during intravenous contrast administration. CONTRAST:  69mL ISOVUE-300 IOPAMIDOL (ISOVUE-300) INJECTION 61% COMPARISON:  Chest CT 11/17/2017 and PET-CT 11/22/2017 FINDINGS: Cardiovascular: Atherosclerotic disease in the thoracic aorta without aneurysm. Heart size is normal without significant pericardial fluid. Mediastinum/Nodes: Again noted are prominent lymph nodes in the mediastinum. Index lymph node in the subcarinal station measures 1.1 cm in the short axis on sequence 5, image 63, previously measured up to 1.4 cm. Precarinal soft tissue on sequence 5, image 54 measures 1.6 cm in short axis and unchanged. Small lymph nodes in the AP window are again noted. Pretracheal lymph node on sequence 5, image 45 measures 0.7 cm in the short axis and stable. Again noted are small supraclavicular lymph nodes. Markedly decreased soft tissue surrounding the central right lower lobe airways. No significant left hilar lymphadenopathy. No significant axillary lymph node enlargement. Lungs/Pleura: The trachea and mainstem bronchi are patent. There is improved aeration in the right lower lobe but there continues to be focal volume loss at the anterior right lower lobe base. There continues to be focal volume loss at the medial right middle lobe base but these areas of consolidation have decreased in size. Within these areas of consolidation, there are focal areas of low density suggestive for necrotic material. Necrotic area in the right lower lobe consolidation measures up to 1.2 cm and previously measured 2.2 cm. Focal low-density area in the right middle  lobe consolidation measures roughly 1.2 cm and previously measured 1.7 cm. The right pleural effusion has essentially resolved. The patchy ground-glass disease in the right lower lobe has decreased. Again noted is a flattened pleural-based density in the right lower lobe measuring 8 x 4 mm on sequence 6, image 74 and minimally changed. There is a new area of peripheral consolidation involving the lateral right middle lobe on sequence 6, image 80. There are small low-density areas within this new consolidation and consistent with  necrotic tissue. Small peripheral density in the lingula on sequence 6, image 110 probably represents atelectasis. Overall, there is no significant disease in the left lung. Again noted are subtle areas of ground-glass density in the left upper lobe. Upper Abdomen: Images of the upper abdomen are unremarkable. Musculoskeletal: No acute or suspicious bone findings. IMPRESSION: The consolidation at the base of the right lower lobe and right middle lobe has decreased in size and findings are most compatible with an improving infectious or inflammatory process. There is a new area of peripheral airspace disease in the right middle lobe suggestive for a new infection. Small low-density collections within these areas of consolidation are suggestive for areas of lung necrosis. These areas of necrosis have decreased in size within the base of the right middle lobe and right lower lobe. There are small foci of low density or necrosis within the new area of airspace disease in the periphery of the right middle lobe. Right pleural effusion has resolved. CT-guided lung biopsy was not performed due to the interval improvement. Pleural-based nodule in the right lower lobe with a mean diameter 6 mm. This nodule is indeterminate and recommend attention on follow up imaging. Suggest short-term follow-up chest CT within 3 months to further evaluate the areas of consolidation. Aortic Atherosclerosis  (ICD10-I70.0). Electronically Signed   By: Markus Daft M.D.   On: 12/20/2017 10:55       Thank  you for the consultation and for allowing [facility] to assist in the care of your patient. Our recommendations are noted above.  Please contact us if we can be of further service.   Marda Stalker, M.D., F.C.C.P.  Board Certified in Internal Medicine, Pulmonary Medicine, [place], and Sleep Medicine.  [facility] Office Number: [phone]   12/22/2017

## 2017-12-23 ENCOUNTER — Other Ambulatory Visit
Admission: RE | Admit: 2017-12-23 | Discharge: 2017-12-23 | Disposition: A | Discharge status: Home / Self Care | Payer: PPO | Source: Ambulatory Visit | Attending: Internal Medicine | Admitting: Internal Medicine

## 2017-12-23 DIAGNOSIS — J8489 Other specified interstitial pulmonary diseases: Secondary | ICD-10-CM | POA: Diagnosis not present

## 2017-12-23 DIAGNOSIS — R918 Other nonspecific abnormal finding of lung field: Secondary | ICD-10-CM | POA: Diagnosis not present

## 2017-12-24 LAB — ANGIOTENSIN CONVERTING ENZYME: Angiotensin-Converting Enzyme: 31 U/L (ref 14–82)

## 2017-12-26 ENCOUNTER — Ambulatory Visit: Payer: Self-pay | Admitting: Hematology and Oncology

## 2017-12-27 LAB — PAN-ANCA
ANCA Proteinase 3: <3.5 U/mL (ref 0.0–3.5)
Atypical P-ANCA titer: <1:20 {titer}
C-ANCA: <1:20 {titer}
Myeloperoxidase Abs: <9 U/mL (ref 0.0–9.0)
P-ANCA: <1:20 {titer}

## 2018-01-03 DIAGNOSIS — F411 Generalized anxiety disorder: Secondary | ICD-10-CM | POA: Diagnosis not present

## 2018-01-03 DIAGNOSIS — F332 Major depressive disorder, recurrent severe without psychotic features: Secondary | ICD-10-CM | POA: Diagnosis not present

## 2018-01-10 LAB — ACID FAST CULTURE WITH REFLEXED SENSITIVITIES (MYCOBACTERIA): Acid Fast Culture: NEGATIVE

## 2018-01-17 DIAGNOSIS — S32019A Unspecified fracture of first lumbar vertebra, initial encounter for closed fracture: Secondary | ICD-10-CM | POA: Diagnosis not present

## 2018-01-17 DIAGNOSIS — I252 Old myocardial infarction: Secondary | ICD-10-CM | POA: Diagnosis not present

## 2018-01-17 DIAGNOSIS — F17210 Nicotine dependence, cigarettes, uncomplicated: Secondary | ICD-10-CM | POA: Diagnosis not present

## 2018-01-17 DIAGNOSIS — Z7952 Long term (current) use of systemic steroids: Secondary | ICD-10-CM | POA: Diagnosis not present

## 2018-01-17 DIAGNOSIS — F329 Major depressive disorder, single episode, unspecified: Secondary | ICD-10-CM | POA: Diagnosis not present

## 2018-01-17 DIAGNOSIS — R1030 Lower abdominal pain, unspecified: Secondary | ICD-10-CM | POA: Diagnosis not present

## 2018-01-17 DIAGNOSIS — I10 Essential (primary) hypertension: Secondary | ICD-10-CM | POA: Diagnosis not present

## 2018-01-17 DIAGNOSIS — Z79899 Other long term (current) drug therapy: Secondary | ICD-10-CM | POA: Diagnosis not present

## 2018-01-17 DIAGNOSIS — R109 Unspecified abdominal pain: Secondary | ICD-10-CM | POA: Diagnosis not present

## 2018-01-17 DIAGNOSIS — M545 Low back pain: Secondary | ICD-10-CM | POA: Diagnosis not present

## 2018-01-17 DIAGNOSIS — F419 Anxiety disorder, unspecified: Secondary | ICD-10-CM | POA: Diagnosis not present

## 2018-02-16 ENCOUNTER — Inpatient Hospital Stay
Admission: EM | Admit: 2018-02-16 | Discharge: 2018-02-19 | DRG: 178 | Disposition: A | Discharge status: Home / Self Care | Payer: PPO | Attending: Internal Medicine | Admitting: Internal Medicine

## 2018-02-16 ENCOUNTER — Other Ambulatory Visit: Payer: Self-pay

## 2018-02-16 ENCOUNTER — Emergency Department: Payer: PPO

## 2018-02-16 ENCOUNTER — Telehealth: Payer: Self-pay | Admitting: Internal Medicine

## 2018-02-16 ENCOUNTER — Encounter: Payer: Self-pay | Admitting: Emergency Medicine

## 2018-02-16 DIAGNOSIS — R079 Chest pain, unspecified: Secondary | ICD-10-CM | POA: Diagnosis not present

## 2018-02-16 DIAGNOSIS — I10 Essential (primary) hypertension: Secondary | ICD-10-CM | POA: Diagnosis present

## 2018-02-16 DIAGNOSIS — J852 Abscess of lung without pneumonia: Secondary | ICD-10-CM | POA: Diagnosis not present

## 2018-02-16 DIAGNOSIS — Z9861 Coronary angioplasty status: Secondary | ICD-10-CM | POA: Diagnosis not present

## 2018-02-16 DIAGNOSIS — R0602 Shortness of breath: Secondary | ICD-10-CM | POA: Diagnosis not present

## 2018-02-16 DIAGNOSIS — Z79899 Other long term (current) drug therapy: Secondary | ICD-10-CM

## 2018-02-16 DIAGNOSIS — R0781 Pleurodynia: Secondary | ICD-10-CM | POA: Diagnosis not present

## 2018-02-16 DIAGNOSIS — F329 Major depressive disorder, single episode, unspecified: Secondary | ICD-10-CM | POA: Diagnosis present

## 2018-02-16 DIAGNOSIS — F17200 Nicotine dependence, unspecified, uncomplicated: Secondary | ICD-10-CM | POA: Diagnosis present

## 2018-02-16 DIAGNOSIS — Z7983 Long term (current) use of bisphosphonates: Secondary | ICD-10-CM

## 2018-02-16 DIAGNOSIS — M8008XA Age-related osteoporosis with current pathological fracture, vertebra(e), initial encounter for fracture: Secondary | ICD-10-CM | POA: Diagnosis not present

## 2018-02-16 DIAGNOSIS — Z880 Allergy status to penicillin: Secondary | ICD-10-CM

## 2018-02-16 DIAGNOSIS — E785 Hyperlipidemia, unspecified: Secondary | ICD-10-CM | POA: Diagnosis present

## 2018-02-16 DIAGNOSIS — Z792 Long term (current) use of antibiotics: Secondary | ICD-10-CM

## 2018-02-16 DIAGNOSIS — J851 Abscess of lung with pneumonia: Secondary | ICD-10-CM | POA: Diagnosis not present

## 2018-02-16 DIAGNOSIS — Z66 Do not resuscitate: Secondary | ICD-10-CM | POA: Diagnosis present

## 2018-02-16 DIAGNOSIS — I252 Old myocardial infarction: Secondary | ICD-10-CM

## 2018-02-16 DIAGNOSIS — J90 Pleural effusion, not elsewhere classified: Secondary | ICD-10-CM | POA: Diagnosis not present

## 2018-02-16 DIAGNOSIS — F419 Anxiety disorder, unspecified: Secondary | ICD-10-CM | POA: Diagnosis not present

## 2018-02-16 DIAGNOSIS — J45909 Unspecified asthma, uncomplicated: Secondary | ICD-10-CM | POA: Diagnosis present

## 2018-02-16 DIAGNOSIS — J869 Pyothorax without fistula: Secondary | ICD-10-CM

## 2018-02-16 DIAGNOSIS — M810 Age-related osteoporosis without current pathological fracture: Secondary | ICD-10-CM | POA: Diagnosis not present

## 2018-02-16 DIAGNOSIS — R52 Pain, unspecified: Secondary | ICD-10-CM

## 2018-02-16 DIAGNOSIS — R0789 Other chest pain: Secondary | ICD-10-CM

## 2018-02-16 DIAGNOSIS — J860 Pyothorax with fistula: Secondary | ICD-10-CM

## 2018-02-16 HISTORY — DX: Pyothorax without fistula: J86.9

## 2018-02-16 LAB — BASIC METABOLIC PANEL
Anion gap: 9 (ref 5–15)
BUN: 11 mg/dL (ref 8–23)
CHLORIDE: 107 mmol/L (ref 98–111)
CO2: 23 mmol/L (ref 22–32)
CREATININE: 0.77 mg/dL (ref 0.44–1.00)
Calcium: 8.6 mg/dL — ABNORMAL LOW (ref 8.9–10.3)
GFR calc Af Amer: >60 mL/min (ref >60)
GFR calc non Af Amer: >60 mL/min (ref >60)
Glucose, Bld: 99 mg/dL (ref 70–99)
Potassium: 3.8 mmol/L (ref 3.5–5.1)
Sodium: 139 mmol/L (ref 135–145)

## 2018-02-16 LAB — HEPATIC FUNCTION PANEL
ALT: 9 U/L (ref 0–44)
AST: 14 U/L — ABNORMAL LOW (ref 15–41)
Albumin: 3.2 g/dL — ABNORMAL LOW (ref 3.5–5.0)
Alkaline Phosphatase: 143 U/L — ABNORMAL HIGH (ref 38–126)
BILIRUBIN TOTAL: 0.2 mg/dL — AB (ref 0.3–1.2)
Total Protein: 6.8 g/dL (ref 6.5–8.1)

## 2018-02-16 LAB — CBC
HCT: 34.6 % — ABNORMAL LOW (ref 35.0–47.0)
Hemoglobin: 11.2 g/dL — ABNORMAL LOW (ref 12.0–16.0)
MCH: 29.9 pg (ref 26.0–34.0)
MCHC: 32.4 g/dL (ref 32.0–36.0)
MCV: 92.1 fL (ref 80.0–100.0)
PLATELETS: 511 10*3/uL — AB (ref 150–440)
RBC: 3.76 MIL/uL — ABNORMAL LOW (ref 3.80–5.20)
RDW: 18.6 % — AB (ref 11.5–14.5)
WBC: 17.1 10*3/uL — ABNORMAL HIGH (ref 3.6–11.0)

## 2018-02-16 LAB — TROPONIN I: Troponin I: <0.03 ng/mL (ref <0.03)

## 2018-02-16 LAB — LIPASE, BLOOD: Lipase: 23 U/L (ref 11–51)

## 2018-02-16 LAB — LACTIC ACID, PLASMA: Lactic Acid, Venous: 0.9 mmol/L (ref 0.5–1.9)

## 2018-02-16 MED ORDER — HYDROMORPHONE HCL 1 MG/ML IJ SOLN
1.0000 mg | INTRAMUSCULAR | Status: AC
  Administered 2018-02-16: 1 mg via INTRAVENOUS
  Filled 2018-02-16: qty 1

## 2018-02-16 MED ORDER — METOPROLOL SUCCINATE ER 50 MG PO TB24
50.0000 mg | ORAL_TABLET | Freq: Every day | ORAL | Status: DC
  Administered 2018-02-17 – 2018-02-19 (×3): 50 mg via ORAL
  Filled 2018-02-16 (×3): qty 1

## 2018-02-16 MED ORDER — SODIUM CHLORIDE 0.9 % IV SOLN
2.0000 g | Freq: Two times a day (BID) | INTRAVENOUS | Status: DC
  Administered 2018-02-17: 2 g via INTRAVENOUS
  Filled 2018-02-16 (×4): qty 2

## 2018-02-16 MED ORDER — HYDROXYZINE HCL 25 MG PO TABS: 25.0000 mg | ORAL_TABLET | Freq: Every evening | ORAL | Status: DC | PRN

## 2018-02-16 MED ORDER — ALPRAZOLAM 1 MG PO TABS
1.0000 mg | ORAL_TABLET | Freq: Four times a day (QID) | ORAL | Status: DC
  Administered 2018-02-16 – 2018-02-19 (×11): 1 mg via ORAL
  Filled 2018-02-16 (×3): qty 1
  Filled 2018-02-16: qty 2
  Filled 2018-02-16 (×7): qty 1

## 2018-02-16 MED ORDER — DICYCLOMINE HCL 10 MG PO CAPS: 10.0000 mg | ORAL_CAPSULE | Freq: Four times a day (QID) | ORAL | Status: DC | PRN

## 2018-02-16 MED ORDER — HEPARIN SODIUM (PORCINE) 5000 UNIT/ML IJ SOLN
5000.0000 [IU] | Freq: Three times a day (TID) | INTRAMUSCULAR | Status: DC
  Administered 2018-02-16 – 2018-02-19 (×9): 5000 [IU] via SUBCUTANEOUS
  Filled 2018-02-16 (×9): qty 1

## 2018-02-16 MED ORDER — HYDROMORPHONE HCL 1 MG/ML IJ SOLN
1.0000 mg | INTRAMUSCULAR | Status: DC | PRN
  Administered 2018-02-16 – 2018-02-17 (×7): 1 mg via INTRAVENOUS
  Filled 2018-02-16 (×8): qty 1

## 2018-02-16 MED ORDER — VENLAFAXINE HCL ER 75 MG PO CP24
75.0000 mg | ORAL_CAPSULE | Freq: Every day | ORAL | Status: DC
  Administered 2018-02-16 – 2018-02-19 (×4): 75 mg via ORAL
  Filled 2018-02-16 (×4): qty 1

## 2018-02-16 MED ORDER — VANCOMYCIN HCL IN DEXTROSE 1-5 GM/200ML-% IV SOLN
1000.0000 mg | Freq: Once | INTRAVENOUS | Status: AC
  Administered 2018-02-17: 1000 mg via INTRAVENOUS
  Filled 2018-02-16: qty 200

## 2018-02-16 MED ORDER — HYDROMORPHONE HCL 1 MG/ML IJ SOLN
0.5000 mg | INTRAMUSCULAR | Status: AC
  Administered 2018-02-16: 0.5 mg via INTRAVENOUS
  Filled 2018-02-16: qty 1

## 2018-02-16 MED ORDER — NICOTINE 14 MG/24HR TD PT24
14.0000 mg | MEDICATED_PATCH | Freq: Every day | TRANSDERMAL | Status: DC
  Administered 2018-02-16 – 2018-02-19 (×4): 14 mg via TRANSDERMAL
  Filled 2018-02-16 (×5): qty 1

## 2018-02-16 MED ORDER — ONDANSETRON HCL 4 MG PO TABS: 4.0000 mg | ORAL_TABLET | Freq: Four times a day (QID) | ORAL | Status: DC | PRN

## 2018-02-16 MED ORDER — SIMVASTATIN 40 MG PO TABS
40.0000 mg | ORAL_TABLET | Freq: Every day | ORAL | Status: DC
  Administered 2018-02-17 – 2018-02-19 (×3): 40 mg via ORAL
  Filled 2018-02-16 (×4): qty 1

## 2018-02-16 MED ORDER — ACETAMINOPHEN 325 MG PO TABS
650.0000 mg | ORAL_TABLET | Freq: Four times a day (QID) | ORAL | Status: DC | PRN
  Filled 2018-02-16: qty 2

## 2018-02-16 MED ORDER — IOPAMIDOL (ISOVUE-370) INJECTION 76%
75.0000 mL | Freq: Once | INTRAVENOUS | Status: AC | PRN
  Administered 2018-02-16: 75 mL via INTRAVENOUS

## 2018-02-16 MED ORDER — SODIUM CHLORIDE 0.9 % IV SOLN
2.0000 g | Freq: Once | INTRAVENOUS | Status: AC
  Administered 2018-02-16: 2 g via INTRAVENOUS
  Filled 2018-02-16: qty 2

## 2018-02-16 MED ORDER — SODIUM CHLORIDE 0.9 % IV BOLUS
500.0000 mL | Freq: Once | INTRAVENOUS | Status: AC
  Administered 2018-02-16: 500 mL via INTRAVENOUS

## 2018-02-16 MED ORDER — BUSPIRONE HCL 10 MG PO TABS
10.0000 mg | ORAL_TABLET | Freq: Two times a day (BID) | ORAL | Status: DC
  Administered 2018-02-16 – 2018-02-19 (×6): 10 mg via ORAL
  Filled 2018-02-16 (×7): qty 1

## 2018-02-16 MED ORDER — ONDANSETRON HCL 4 MG/2ML IJ SOLN
4.0000 mg | INTRAMUSCULAR | Status: AC
  Administered 2018-02-16: 4 mg via INTRAVENOUS
  Filled 2018-02-16: qty 2

## 2018-02-16 MED ORDER — MORPHINE SULFATE (PF) 4 MG/ML IV SOLN
4.0000 mg | Freq: Once | INTRAVENOUS | Status: AC
  Administered 2018-02-16: 4 mg via INTRAVENOUS
  Filled 2018-02-16: qty 1

## 2018-02-16 MED ORDER — ACETAMINOPHEN 650 MG RE SUPP: 650.0000 mg | Freq: Four times a day (QID) | RECTAL | Status: DC | PRN

## 2018-02-16 MED ORDER — TIOTROPIUM BROMIDE MONOHYDRATE 18 MCG IN CAPS
18.0000 ug | ORAL_CAPSULE | Freq: Every day | RESPIRATORY_TRACT | Status: DC
  Administered 2018-02-17 – 2018-02-19 (×3): 18 ug via RESPIRATORY_TRACT
  Filled 2018-02-16: qty 5

## 2018-02-16 MED ORDER — ONDANSETRON HCL 4 MG/2ML IJ SOLN: 4.0000 mg | Freq: Four times a day (QID) | INTRAMUSCULAR | Status: DC | PRN

## 2018-02-16 NOTE — ED Notes (Signed)
Pt in x-ray at this time

## 2018-02-16 NOTE — H&P (Signed)
[facility] [place] NAME: Wendy Arias    MR#:  [id number]  DATE OF BIRTH:  Mar 14, 1951  DATE OF ADMISSION:  02/16/2018  PRIMARY CARE PHYSICIAN: Glendon Axe, MD   REQUESTING/REFERRING PHYSICIAN: Hinda Kehr, MD  CHIEF COMPLAINT:   Chief Complaint  Patient presents with  . Chest Pain  . Shortness of Breath    HISTORY OF PRESENT ILLNESS:  Wendy Arias  is a 67 y.o. female with a known history of HTN, hx MI, asthma, osteoporosis, anxiety/depression who presented to the ED with right sided chest pain that started this morning at 7 AM.  The pain woke her up from sleep.  The pain is sharp in nature and radiates to her abdomen, back, and right arm.  She endorses fevers and chills over the last couple of months.  She endorses associated nausea.  No shortness of breath and no hemoptysis.  She was first noticed to have a right middle lung mass on 11/17/2017 was found as an incidental finding on a CT chest/abdomen/pelvis.  She had a PET scan on 11/22/2017 that showed a large hypermetabolic masslike foci of consolidation in the medial right middle lobe and anterior right lower lobe as well as a hypermetabolic irregular nodular focus of consolidation in the peripheral right middle lobe, concerning for primary bronchogenic malignancy versus multi lobar pneumonia.  She had an EBUS bronchoscopy on 11/28/2017 with fluoroscopic guided transbronchial biopsies and lymph node biopsies.  Biopsies were negative for malignancy. She then had a bone scan on 11/30/2017 that did not show any osseous metastatic disease.  She then underwent CT-guided needle biopsy on 12/20/2017.  Biopsy was negative for malignancy.  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 She was started on vanc and cefepime. Hospitalist team was called for admission.  PAST MEDICAL HISTORY:   Past Medical History:  Diagnosis Date  . Anxiety   . Asthma   . Depression   . Hypertension   . Myocardial infarction ([place])   . Osteoporosis     PAST SURGICAL HISTORY:   Past Surgical History:  Procedure Laterality Date  . ABDOMINAL HYSTERECTOMY     partial  . CARDIAC CATHETERIZATION    . CARDIAC SURGERY    . COLONOSCOPY WITH PROPOFOL N/A 04/12/2017   Procedure: COLONOSCOPY WITH PROPOFOL;  Surgeon: Lollie Sails, MD;  Location: [facility] ENDOSCOPY;  Service: Endoscopy;  Laterality: N/A;  . CORONARY ANGIOPLASTY    . ENDOBRONCHIAL ULTRASOUND N/A 11/28/2017   Procedure: ENDOBRONCHIAL ULTRASOUND;  Surgeon: Laverle Hobby, MD;  Location: [facility] ORS;  Service: Pulmonary;  Laterality: N/A;  . REDUCTION MAMMAPLASTY      SOCIAL HISTORY:   Social History   Tobacco Use  . Smoking status: Current Some Day Smoker    Packs/day: 0.50    Years: 20.00    Pack years: 10.00  . Smokeless tobacco: Never Used  Substance Use Topics  . Alcohol use: Yes    Alcohol/week: 1.0 standard drinks    Types: 1 Glasses of wine per week    Comment: rarely    FAMILY HISTORY:   Family History  Problem Relation Age of Onset  . Breast cancer Mother 49  . Hypertension Mother   .  Dementia Mother   . Breast cancer Maternal Aunt 70  . Breast cancer Other 35  . Hypertension Father   . Heart disease Father   . Diabetes Father   . Diabetes Sister     DRUG ALLERGIES:   Allergies  Allergen Reactions  . Penicillins Other (See Comments)    Has patient had a PCN reaction causing immediate rash, facial/tongue/throat swelling, SOB or lightheadedness with hypotension: Yes Has patient had a PCN reaction causing severe rash involving mucus membranes or skin necrosis: Unknown Has patient had a PCN reaction that  required hospitalization: Unknown Has patient had a PCN reaction occurring within the last 10 years: Unknown If all of the above answers are "NO", then may proceed with Cephalosporin use.    REVIEW OF SYSTEMS:   Review of Systems  Constitutional: Positive for chills, fever and malaise/fatigue.  HENT: Negative for congestion and sore throat.   Eyes: Negative for blurred vision and double vision.  Respiratory: Positive for cough and shortness of breath. Negative for hemoptysis.   Cardiovascular: Positive for chest pain. Negative for leg swelling.  Gastrointestinal: Positive for abdominal pain. Negative for nausea and vomiting.  Genitourinary: Negative for dysuria and frequency.  Musculoskeletal: Positive for back pain. Negative for neck pain.  Neurological: Negative for dizziness and headaches.  Psychiatric/Behavioral: Negative for depression. The patient is nervous/anxious.     MEDICATIONS AT HOME:   Prior to Admission medications   Medication Sig Start Date End Date Taking? Authorizing Provider  alprazolam Duanne Moron) 2 MG tablet Take 1 mg by mouth 4 (four) times daily. 10/05/17   [provider]  busPIRone (BUSPAR) 10 MG tablet Take 10 mg by mouth 2 (two) times daily. 11/01/17   [provider]  citalopram (CELEXA) 20 MG tablet Take 20 mg by mouth daily. 11/01/17   [provider]  dicyclomine (BENTYL) 10 MG capsule Take 10 mg by mouth every 6 (six) hours as needed for spasms. 11/01/17   [provider]  hydrOXYzine (ATARAX/VISTARIL) 25 MG tablet Take 25 mg by mouth as needed (sleep). 1-4 as needed for sleep 11/10/17   [provider]  ibandronate (BONIVA) 150 MG tablet Take 150 mg by mouth every 30 (thirty) days. Take in the morning with a full glass of water, on an empty stomach, and do not take anything else by mouth or lie down for the next 30 min.    [provider]  levofloxacin (LEVAQUIN) 500 MG tablet Take 1 tablet (500 mg total) by  mouth daily. 12/20/17   Lequita Asal, MD  metoprolol succinate (TOPROL-XL) 50 MG 24 hr tablet Take 50 mg by mouth daily. Take with or immediately following a meal.    [provider]  predniSONE (DELTASONE) 10 MG tablet Take 1 tablet (10 mg total) by mouth daily with breakfast. Take 4 tablets daily for 7 days, then Take 3 tablets daily for 7 days, then Take 2 tablets daily for 7 days, then Take 1 tablet daily for 7 days then stop. 12/22/17   Laverle Hobby, MD  simvastatin (ZOCOR) 40 MG tablet Take 40 mg by mouth daily.    [provider]  tiotropium (SPIRIVA) 18 MCG inhalation capsule Place 1 capsule (18 mcg total) into inhaler and inhale daily. 11/22/17 12/22/17  Laverle Hobby, MD  venlafaxine XR (EFFEXOR-XR) 75 MG 24 hr capsule Take 75 mg by mouth daily with breakfast.     [provider]  vitamin B-12 (CYANOCOBALAMIN) 1000 MCG tablet Take 1,000 mcg  by mouth daily.    [provider]  zolpidem (AMBIEN) 10 MG tablet Take 5-10 mg by mouth at bedtime and may repeat dose one time if needed. 10mg  at bedtime, may repeat 5mg  if needed 10/05/17   [provider]      VITAL SIGNS:  Blood pressure 113/62, pulse 85, temperature 98.3 F (36.8 C), temperature source Oral, resp. rate (!) 22, height 5\' 2"  (1.575 m), weight 50.8 kg, SpO2 91 %.  PHYSICAL EXAMINATION:  Physical Exam  GENERAL:  67 y.o.-year-old patient lying in the bed with no acute distress.  EYES: Pupils equal, round, reactive to light and accommodation. No scleral icterus. Extraocular muscles intact.  HEENT: Head atraumatic, normocephalic. Oropharynx and nasopharynx clear.  NECK:  Supple, no jugular venous distention. No thyroid enlargement, no tenderness.  LUNGS: Taking shallow breaths, diminished breath sounds in the lung bases bilaterally, +right basilar crackles, no wheezing or rhonchi. No use of accessory muscles of respiration.  CARDIOVASCULAR: RRR, S1, S2 normal. No  murmurs, rubs, or gallops.  ABDOMEN: Soft, nontender, nondistended. Bowel sounds present. No organomegaly or mass.  EXTREMITIES: No pedal edema, cyanosis, or clubbing.  NEUROLOGIC: Cranial nerves II through XII are intact. Muscle strength 5/5 in all extremities. Sensation intact. Gait not checked.  PSYCHIATRIC: The patient is alert and oriented x 3.  SKIN: No obvious rash, lesion, or ulcer.   LABORATORY PANEL:   CBC Recent Labs  Lab 02/16/18 1411  WBC 17.1*  HGB 11.2*  HCT 34.6*  PLT 511*   ------------------------------------------------------------------------------------------------------------------  Chemistries  Recent Labs  Lab 02/16/18 1411 02/16/18 1525  NA 139  --   K 3.8  --   CL 107  --   CO2 23  --   GLUCOSE 99  --   BUN 11  --   CREATININE 0.77  --   CALCIUM 8.6*  --   AST  --  14*  ALT  --  9  ALKPHOS  --  143*  BILITOT  --  0.2*   ------------------------------------------------------------------------------------------------------------------  Cardiac Enzymes Recent Labs  Lab 02/16/18 1411  TROPONINI <0.03   ------------------------------------------------------------------------------------------------------------------  RADIOLOGY:  Dg Chest 2 View  Result Date: 02/16/2018 CLINICAL DATA:  Severe right-sided chest pain this morning. EXAM: CHEST - 2 VIEW COMPARISON:  CT chest dated December 20, 2017. Chest x-ray dated November 28, 2017. FINDINGS: The heart size and mediastinal contours are within normal limits. Normal pulmonary vascularity. Atherosclerotic calcification of the aortic arch. Persistent but slightly improved opacity in the peripheral right middle lobe. No new focal airspace disease. No pleural effusion or pneumothorax. New moderate compression fracture of L1. IMPRESSION: 1. Persistent airspace disease in the right middle lobe. This could be better evaluated with chest CT, allowing for better direct comparison to prior CT chest from June. 2. New  age-indeterminate moderate L1 compression fracture. Correlate with point tenderness. Electronically Signed   By: Titus Dubin M.D.   On: 02/16/2018 15:02   Ct Angio Chest Pe W/cm &/or Wo Cm  Result Date: 02/16/2018 CLINICAL DATA:  67 year old female with history of right-sided chest pain radiating into her back and shoulder. Recent history of lung biopsy on the right side 1 month ago. History of prior myocardial infarction. Shortness of breath. EXAM: CT ANGIOGRAPHY CHEST WITH CONTRAST TECHNIQUE: Multidetector CT imaging of the chest was performed using the standard protocol during bolus administration of intravenous contrast. Multiplanar CT image reconstructions and MIPs were obtained to evaluate the vascular anatomy. CONTRAST:  71mL ISOVUE-370 IOPAMIDOL (ISOVUE-370)  INJECTION 76% COMPARISON:  Chest CT 12/20/2017. FINDINGS: Cardiovascular: No filling defects in the pulmonary arterial tree to suggest underlying pulmonary embolism. Heart size is normal. There is no significant pericardial fluid, thickening or pericardial calcification. Aortic atherosclerosis. No definite coronary artery calcifications are identified. Mediastinum/Nodes: Prominent borderline enlarged low right paratracheal lymph node measuring 1.3 cm in short axis. Subcarinal lymph node also borderline enlarged measuring 1.1 cm in short axis. No definite hilar lymphadenopathy. Esophagus is unremarkable in appearance. No axillary lymphadenopathy. Lungs/Pleura: There continues to be a mass-like area in the lateral segment of the right middle lobe (axial image 57 of series 6) which measures 4.6 x 5.5 cm. "CT angiogram sign" through this region suggest that this is predominantly airspace consolidation. There is an irregular area of enhancement with some central low-attenuation (axial image 58 of series 4) which extends to the pleural surface, concerning for potential small intrapulmonary abscess. Compared to the prior study there is new complex  partially loculated small pleural effusion throughout the right mid to lower hemithorax. Additional areas of scarring and/or subsegmental atelectasis are noted in the right lung base, as well as in the inferior segment of the lingula. No left pleural effusion. Upper Abdomen: Aortic atherosclerosis. Musculoskeletal: New compression fracture of L1 with 80% loss of anterior vertebral body height, well-defined fracture lines, and small amount of paravertebral soft tissue swelling, suggestive of a subacute injury. There are no aggressive appearing lytic or blastic lesions noted in the visualized portions of the skeleton. Review of the MIP images confirms the above findings. IMPRESSION: 1. No evidence of pulmonary embolism. 2. Increasing mass-like area of probable airspace consolidation in the lateral segment of the right middle lobe, within which there is a potential area of intrapulmonary abscess which appears to extend to the pleural surface. Given the presence of what appears to be a very small but complex and loculated right-sided pleural effusion, the possibility of developing bronchopleural fistula should be considered. 3. New likely subacute L1 vertebral body compression fracture with 80% loss of anterior vertebral body height. 4. Aortic atherosclerosis. Aortic Atherosclerosis (ICD10-I70.0). Electronically Signed   By: Vinnie Langton M.D.   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 Has been worked up as outpatient for right middle lung mass and has had negative biopsies. Has been following with Dr. Ashby Dawes (pulm) and Dr. Mike Gip (onc) as an outpatient. WBC 17.1, but patient is not septic. - continue vanc/cefepime - blood cultures pending - pulmonology consulted - cardiothoracic surgery consulted - dilaudid for pain - wean O2 as able   Hypertension- normotensive - continue metoprolol  Hyperlipidemia-  stable - continue zocor  Anxiety- worsened with shortness of breath - continue xanax, buspar, effexor, and hydroxyzine  Osteoporosis with L1 compression fracture- read as "new" on CTA chest, but patient is aware that she has this. - takes ibandronate q30 days at home  Tobacco use - nicotine patch prn  DVT prophylaxis- heparin  All the records are reviewed and case discussed with ED provider. Management plans discussed with the patient, family and they are in agreement.  CODE STATUS: DNR  TOTAL TIME TAKING CARE OF THIS PATIENT: 40 minutes.    Berna Spare Mayo M.D on 02/16/2018 at 5:18 PM  Between 7am to 6pm - Pager [phone]  After 6pm go to [email or web address] - [employer] [facility]  Office  [phone]  CC: Primary  care physician; Glendon Axe, MD   Note: This dictation was prepared with Dragon dictation along with smaller phrase technology. Any transcriptional errors that result from this process are unintentional.

## 2018-02-16 NOTE — Telephone Encounter (Signed)
Patient on the way to the er now having sob and pain that is intolerable .

## 2018-02-16 NOTE — ED Triage Notes (Signed)
Pt comes into the Ed via POV c/o right side chest pain that radiates into her back and right shoulder.  Patient recently underwent a lung biopsy on the right side about a month ago.  Patient has h/o MI in the past.  Patient short of breath at this time and taking shallow breaths.  Patient does not present with diaphoresis at this time. Patient has COPD.

## 2018-02-16 NOTE — ED Provider Notes (Signed)
 [facility] Emergency Department Provider Note  ____________________________________________   First MD Initiated Contact with Patient 02/16/18 1437     (approximate)  I have reviewed the triage vital signs and the nursing notes.   HISTORY  Chief Complaint Chest Pain and Shortness of Breath    HPI Wendy Arias is a 67 y.o. female with medical history as listed below that notably includes a right middle lobe mass for which she has been worked up in the past to determine the possibility of lung cancer.  She presents today for acute onset and severe pain with associated shortness of breath in the right side of her chest.  She did not have any injury of which she is aware, but reports that right before arrival the pain began and was severe immediately.  She describes it as sharp and stabbing as well as aching and located in the middle of her chest and radiating to her back and front.  She describes it as 10 out of 10 and nothing makes it better or worse.  It is severe enough she is having difficulty speaking and breathing.  She has had pain before including a fall about a month ago resulting in a L1 compression fracture while she was visiting [place] but nothing like this.  She denies recent fever/chills, nausea, vomiting, and abdominal pain.  Past Medical History:  Diagnosis Date  . Anxiety   . Asthma   . Depression   . Hypertension   . Myocardial infarction (HCC)   . Osteoporosis     Patient Active Problem List   Diagnosis Date Noted  . Mass of middle lobe of right lung 11/22/2017  . Mass of lower lobe of right lung 11/22/2017  . Mediastinal adenopathy 11/22/2017  . Acute encephalopathy 11/03/2017  . Benzodiazepine withdrawal (HCC) 11/03/2017  . H/O diarrhea 06/14/2017    Past Surgical History:  Procedure Laterality Date  . ABDOMINAL HYSTERECTOMY     partial  . CARDIAC CATHETERIZATION    . CARDIAC SURGERY    . COLONOSCOPY WITH PROPOFOL N/A  04/12/2017   Procedure: COLONOSCOPY WITH PROPOFOL;  Surgeon: Skulskie, Martin U, MD;  Location: [facility] ENDOSCOPY;  Service: Endoscopy;  Laterality: N/A;  . CORONARY ANGIOPLASTY    . ENDOBRONCHIAL ULTRASOUND N/A 11/28/2017   Procedure: ENDOBRONCHIAL ULTRASOUND;  Surgeon: Ramachandran, Pradeep, MD;  Location: [facility] ORS;  Service: Pulmonary;  Laterality: N/A;  . REDUCTION MAMMAPLASTY      Prior to Admission medications   Medication Sig Start Date End Date Taking? Authorizing Provider  alprazolam (XANAX) 2 MG tablet Take 1 mg by mouth 4 (four) times daily. 10/05/17   [provider]  busPIRone (BUSPAR) 10 MG tablet Take 10 mg by mouth 2 (two) times daily. 11/01/17   [provider]  citalopram (CELEXA) 20 MG tablet Take 20 mg by mouth daily. 11/01/17   [provider]  dicyclomine (BENTYL) 10 MG capsule Take 10 mg by mouth every 6 (six) hours as needed for spasms. 11/01/17   [provider]  hydrOXYzine (ATARAX/VISTARIL) 25 MG tablet Take 25 mg by mouth as needed (sleep). 1-4 as needed for sleep 11/10/17   [provider]  ibandronate (BONIVA) 150 MG tablet Take 150 mg by mouth every 30 (thirty) days. Take in the morning with a full glass of water, on an empty stomach, and do not take anything else by mouth or lie down for the next 30 min.    [provider]  levofloxacin (  LEVAQUIN) 500 MG tablet Take 1 tablet (500 mg total) by mouth daily. 12/20/17   Corcoran, Melissa C, MD  metoprolol succinate (TOPROL-XL) 50 MG 24 hr tablet Take 50 mg by mouth daily. Take with or immediately following a meal.    [provider]  predniSONE (DELTASONE) 10 MG tablet Take 1 tablet (10 mg total) by mouth daily with breakfast. Take 4 tablets daily for 7 days, then Take 3 tablets daily for 7 days, then Take 2 tablets daily for 7 days, then Take 1 tablet daily for 7 days then stop. 12/22/17   Ramachandran, Pradeep, MD  simvastatin (ZOCOR) 40 MG tablet Take 40 mg by mouth  daily.    [provider]  tiotropium (SPIRIVA) 18 MCG inhalation capsule Place 1 capsule (18 mcg total) into inhaler and inhale daily. 11/22/17 12/22/17  Ramachandran, Pradeep, MD  venlafaxine XR (EFFEXOR-XR) 75 MG 24 hr capsule Take 75 mg by mouth daily with breakfast.     [provider]  vitamin B-12 (CYANOCOBALAMIN) 1000 MCG tablet Take 1,000 mcg by mouth daily.    [provider]  zolpidem (AMBIEN) 10 MG tablet Take 5-10 mg by mouth at bedtime and may repeat dose one time if needed. 10mg at bedtime, may repeat 5mg if needed 10/05/17   [provider]    Allergies Penicillins  Family History  Problem Relation Age of Onset  . Breast cancer Mother 70  . Hypertension Mother   . Dementia Mother   . Breast cancer Maternal Aunt 70  . Breast cancer Other 35  . Hypertension Father   . Heart disease Father   . Diabetes Father   . Diabetes Sister     Social History Social History   Tobacco Use  . Smoking status: Current Some Day Smoker    Packs/day: 0.50    Years: 20.00    Pack years: 10.00  . Smokeless tobacco: Never Used  Substance Use Topics  . Alcohol use: Yes    Alcohol/week: 1.0 standard drinks    Types: 1 Glasses of wine per week    Comment: rarely  . Drug use: Never    Review of Systems Constitutional: No fever/chills Eyes: No visual changes. ENT: No sore throat. Cardiovascular: Acute onset severe chest pain as described above Respiratory: shortness of breath associated with the severe chest pain Gastrointestinal: No abdominal pain.  No nausea, no vomiting.  No diarrhea.  No constipation. Genitourinary: Negative for dysuria. Musculoskeletal: Negative for neck pain.  Negative for back pain. Integumentary: Negative for rash. Neurological: Negative for headaches, focal weakness or numbness.   ____________________________________________   PHYSICAL EXAM:  VITAL SIGNS: ED Triage Vitals [02/16/18 1409]  Enc Vitals Group     BP  100/68     Pulse Rate 86     Resp (!) 24     Temp 98.3 F (36.8 C)     Temp Source Oral     SpO2 94 %     Weight 50.8 kg (112 lb)     Height 1.575 m (5' 2")     Head Circumference      Peak Flow      Pain Score 10     Pain Loc      Pain Edu?      Excl. in GC?     Constitutional: Alert and oriented.  Appears to be in a great deal of pain and is splinting.  Small stature and thin body habitus. Eyes: Conjunctivae are normal.  Head:   Atraumatic. Nose: No congestion/rhinnorhea. Mouth/Throat: Mucous membranes are moist. Neck: No stridor.  No meningeal signs.   Cardiovascular: Normal rate, regular rhythm. Good peripheral circulation. Grossly normal heart sounds. Respiratory: Splinting, increased respiratory effort with intercostal retractions and very shallow breaths due to pain.  Lung sounds are heard throughout all lung fields including the apices. Gastrointestinal: Thin habitus.  Soft and nontender. No distention.  Musculoskeletal: No lower extremity tenderness nor edema. No gross deformities of extremities. Neurologic: Clipped speech due to pain but otherwise unremarkable. No gross focal neurologic deficits are appreciated.  Skin:  Skin is warm, dry and intact. No rash noted. Psychiatric: Mood and affect are normal. Speech and behavior are normal.  ____________________________________________   LABS (all labs ordered are listed, but only abnormal results are displayed)  Labs Reviewed  BASIC METABOLIC PANEL - Abnormal; Notable for the following components:      Result Value   Calcium 8.6 (*)    All other components within normal limits  CBC - Abnormal; Notable for the following components:   WBC 17.1 (*)    RBC 3.76 (*)    Hemoglobin 11.2 (*)    HCT 34.6 (*)    RDW 18.6 (*)    Platelets 511 (*)    All other components within normal limits  HEPATIC FUNCTION PANEL - Abnormal; Notable for the following components:   Albumin 3.2 (*)    AST 14 (*)    Alkaline Phosphatase 143  (*)    Total Bilirubin 0.2 (*)    All other components within normal limits  CULTURE, BLOOD (ROUTINE X 2)  CULTURE, BLOOD (ROUTINE X 2)  TROPONIN I  LACTIC ACID, PLASMA  LIPASE, BLOOD   ____________________________________________  EKG  ED ECG REPORT I, Hinda Kehr, the attending physician, personally viewed and interpreted this ECG.  Date: 02/16/2018 EKG Time: 1402 Rate: 85 Rhythm: normal sinus rhythm QRS Axis: normal Intervals: normal ST/T Wave abnormalities: normal Narrative Interpretation: Extensive artifact is present due to tremulousness, but there is no evidence of acute ischemia  ____________________________________________  RADIOLOGY I, Hinda Kehr, personally viewed and evaluated these images (plain radiographs) as part of my medical decision making, as well as reviewing the written report by the radiologist. Also discussed the  CTA chest by phone with the radiologist.  ED MD interpretation: Persistent airspace disease in the right middle lobe.  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  No pneumothorax.  Official radiology report(s): Dg Chest 2 View  Result Date: 02/16/2018 CLINICAL DATA:  Severe right-sided chest pain this morning. EXAM: CHEST - 2 VIEW COMPARISON:  CT chest dated December 20, 2017. Chest x-ray dated November 28, 2017. FINDINGS: The heart size and mediastinal contours are within normal limits. Normal pulmonary vascularity. Atherosclerotic calcification of the aortic arch. Persistent but slightly improved opacity in the peripheral right middle lobe. No new focal airspace disease. No pleural effusion or pneumothorax. New moderate compression fracture of L1. IMPRESSION: 1. Persistent airspace disease in the right middle lobe. This could be better evaluated with chest CT, allowing for better direct comparison to prior CT chest from June. 2. New age-indeterminate moderate L1 compression fracture. Correlate with  point tenderness. Electronically Signed   By: Titus Dubin M.D.   On: 02/16/2018 15:02   Ct Angio Chest Pe W/cm &/or Wo Cm  Result Date: 02/16/2018 CLINICAL DATA:  67 year old female with history of right-sided chest pain radiating into her back and shoulder. Recent history of lung  biopsy on the right side 1 month ago. History of prior myocardial infarction. Shortness of breath. EXAM: CT ANGIOGRAPHY CHEST WITH CONTRAST TECHNIQUE: Multidetector CT imaging of the chest was performed using the standard protocol during bolus administration of intravenous contrast. Multiplanar CT image reconstructions and MIPs were obtained to evaluate the vascular anatomy. CONTRAST:  34m ISOVUE-370 IOPAMIDOL (ISOVUE-370) INJECTION 76% COMPARISON:  Chest CT 12/20/2017. FINDINGS: Cardiovascular: No filling defects in the pulmonary arterial tree to suggest underlying pulmonary embolism. Heart size is normal. There is no significant pericardial fluid, thickening or pericardial calcification. Aortic atherosclerosis. No definite coronary artery calcifications are identified. Mediastinum/Nodes: Prominent borderline enlarged low right paratracheal lymph node measuring 1.3 cm in short axis. Subcarinal lymph node also borderline enlarged measuring 1.1 cm in short axis. No definite hilar lymphadenopathy. Esophagus is unremarkable in appearance. No axillary lymphadenopathy. Lungs/Pleura: There continues to be a mass-like area in the lateral segment of the right middle lobe (axial image 57 of series 6) which measures 4.6 x 5.5 cm. "CT angiogram sign" through this region suggest that this is predominantly airspace consolidation. There is an irregular area of enhancement with some central low-attenuation (axial image 58 of series 4) which extends to the pleural surface, concerning for potential small intrapulmonary abscess. Compared to the prior study there is new complex partially loculated small pleural effusion throughout the right mid to  lower hemithorax. Additional areas of scarring and/or subsegmental atelectasis are noted in the right lung base, as well as in the inferior segment of the lingula. No left pleural effusion. Upper Abdomen: Aortic atherosclerosis. Musculoskeletal: New compression fracture of L1 with 80% loss of anterior vertebral body height, well-defined fracture lines, and small amount of paravertebral soft tissue swelling, suggestive of a subacute injury. There are no aggressive appearing lytic or blastic lesions noted in the visualized portions of the skeleton. Review of the MIP images confirms the above findings. IMPRESSION: 1. No evidence of pulmonary embolism. 2. Increasing mass-like area of probable airspace consolidation in the lateral segment of the right middle lobe, within which there is a potential area of intrapulmonary abscess which appears to extend to the pleural surface. Given the presence of what appears to be a very small but complex and loculated right-sided pleural effusion, the possibility of developing bronchopleural fistula should be considered. 3. New likely subacute L1 vertebral body compression fracture with 80% loss of anterior vertebral body height. 4. Aortic atherosclerosis. Aortic Atherosclerosis (ICD10-I70.0). Electronically Signed   By: DVinnie LangtonM.D.   On: 02/16/2018 15:58    ____________________________________________   PROCEDURES  Critical Care performed: No   Procedure(s) performed:   Procedures   ____________________________________________   INITIAL IMPRESSION / ASSESSMENT AND PLAN / ED COURSE  As part of my medical decision making, I reviewed the following data within the e[place]notes reviewed and incorporated, Labs reviewed , EKG interpreted , Old chart reviewed, Radiograph reviewed , Discussed with admitting physician , Discussed with radiologist and Notes from prior ED visits    Differential diagnosis includes, but is not limited  to, pulmonary embolism, pneumothorax, aortic dissection, ACS, pneumonia or empyema, growing neoplasm always with the possibility of postobstructive pneumonia.  The patient continues to smoke and is at high risk for any of the issues described above.  Fortunately she had lung sounds throughout her lung feels and her chest x-ray did not demonstrate a pneumothorax.  I evaluated broadly with lab work and an EKG.  No evidence of acute ischemia on EKG and  her lab work is reassuring with an essentially normal basic metabolic panel, normal LFTs except for slightly elevated alk phos of uncertain significance, normal lactic acid, normal troponin, normal lipase.  Her CBC was notable for a leukocytosis of 17.1 and he reports not being on oral steroids for at least 2 weeks or more.  After her creatinine came back normal I ordered a 500 mL normal saline bolus and a CTA chest to rule out pulmonary embolism.  I spoke with the radiologist after reading the report.  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  She has received 3 doses of IV narcotics in the ED (4 mg of morphine IV, Dilaudid 0.5 mg IV, and Dilaudid 1 mg IV) and her pain is barely controlled at this time.  I have ordered empiric antibiotics of cefepime 2 g IV and vancomycin 1 g IV for broad-spectrum coverage and I have admitted the patient to the hospitalist for further management and consultation with pulmonology and cardiothoracic surgery.  The patient is hemodynamically stable and feeling much better although she still has at least moderate pain.  In spite of everything described above, she does not meet sepsis criteria; I believe that her tachypnea was the result of her pain, she is afebrile, and she does not have tachycardia.  I put her on 2 L of oxygen by nasal cannula  for comfort as well as for the fact that her oxygenation is around 92% she also has a history of tobacco use and COPD.   Clinical Course as of Feb 17 1656  Thu Feb 16, 2018  1559 Lactic Acid, Venous: 0.9 [CF]  1623 Lipase: 23 [CF]  1623 No acute abnormalities identifed on hepatic function panel.  Hepatic function panel(!) [CF]  1623 WBC(!): 17.1 [CF]    Clinical Course User Index [CF] , , MD    ____________________________________________  FINAL CLINICAL IMPRESSION(S) / ED DIAGNOSES  Final diagnoses:  Intractable pain  Abscess of middle lobe of right lung with pneumonia (HCC)  Bronchopleural fistula (HCC)  Atypical chest pain     MEDICATIONS GIVEN DURING THIS VISIT:  Medications  vancomycin (VANCOCIN) IVPB 1000 mg/200 mL premix (has no administration in time range)  ceFEPIme (MAXIPIME) 2 g in sodium chloride 0.9 % 100 mL IVPB (has no administration in time range)  morphine 4 MG/ML injection 4 mg (4 mg Intravenous Given 02/16/18 1448)  ondansetron (ZOFRAN) injection 4 mg (4 mg Intravenous Given 02/16/18 1446)  HYDROmorphone (DILAUDID) injection 0.5 mg (0.5 mg Intravenous Given 02/16/18 1504)  sodium chloride 0.9 % bolus 500 mL (0 mLs Intravenous Stopped 02/16/18 1619)  iopamidol (ISOVUE-370) 76 % injection 75 mL (75 mLs Intravenous Contrast Given 02/16/18 1524)  HYDROmorphone (DILAUDID) injection 1 mg (1 mg Intravenous Given 02/16/18 1558)     ED Discharge Orders    None       Note:  This document was prepared using Dragon voice recognition software and may include unintentional dictation errors.    , , MD 02/16/18 1657  

## 2018-02-16 NOTE — Telephone Encounter (Signed)
Pt states she still has a bad cough,  States she is having dificulty breathing. Please call.

## 2018-02-16 NOTE — ED Notes (Signed)
Pt has been provided with 3 sodas.

## 2018-02-16 NOTE — Plan of Care (Signed)
Assisted to BR by carenurse,steady gait noted.Will continue to follow set regimen.

## 2018-02-16 NOTE — ED Notes (Signed)
Report given to Barnabas Lister, Therapist, sports. Barnabas Lister aware this nurse going to release dilaudid and xanax orders.

## 2018-02-16 NOTE — Progress Notes (Signed)
Family Meeting Note  Advance Directive:yes  Today a meeting took place with the Patient and family.  Patient is able to participate.   The following clinical team members were present during this meeting:MD  The following were discussed:Patient's diagnosis: , Patient's progosis: Unable to determine and Goals for treatment: DNR  Additional follow-up to be provided: prn  Time spent during discussion:20 minutes  Evette Doffing, MD

## 2018-02-17 ENCOUNTER — Telehealth: Payer: Self-pay | Admitting: *Deleted

## 2018-02-17 DIAGNOSIS — J851 Abscess of lung with pneumonia: Principal | ICD-10-CM

## 2018-02-17 LAB — BASIC METABOLIC PANEL
Anion gap: 5 (ref 5–15)
BUN: 10 mg/dL (ref 8–23)
CO2: 24 mmol/L (ref 22–32)
CREATININE: 0.71 mg/dL (ref 0.44–1.00)
Calcium: 8.4 mg/dL — ABNORMAL LOW (ref 8.9–10.3)
Chloride: 104 mmol/L (ref 98–111)
GFR calc Af Amer: >60 mL/min (ref >60)
GFR calc non Af Amer: >60 mL/min (ref >60)
GLUCOSE: 102 mg/dL — AB (ref 70–99)
Potassium: 3.9 mmol/L (ref 3.5–5.1)
SODIUM: 133 mmol/L — AB (ref 135–145)

## 2018-02-17 LAB — CBC
HCT: 30.8 % — ABNORMAL LOW (ref 35.0–47.0)
Hemoglobin: 10.2 g/dL — ABNORMAL LOW (ref 12.0–16.0)
MCH: 30.6 pg (ref 26.0–34.0)
MCHC: 33.1 g/dL (ref 32.0–36.0)
MCV: 92.6 fL (ref 80.0–100.0)
PLATELETS: 435 10*3/uL (ref 150–440)
RBC: 3.32 MIL/uL — ABNORMAL LOW (ref 3.80–5.20)
RDW: 18.1 % — AB (ref 11.5–14.5)
WBC: 21.4 10*3/uL — AB (ref 3.6–11.0)

## 2018-02-17 LAB — MRSA PCR SCREENING: MRSA by PCR: NEGATIVE

## 2018-02-17 MED ORDER — CLINDAMYCIN PHOSPHATE 600 MG/50ML IV SOLN
600.0000 mg | Freq: Three times a day (TID) | INTRAVENOUS | Status: DC
  Filled 2018-02-17 (×3): qty 50

## 2018-02-17 MED ORDER — HYDROCODONE-ACETAMINOPHEN 5-325 MG PO TABS
1.0000 | ORAL_TABLET | ORAL | Status: DC | PRN
  Administered 2018-02-17 – 2018-02-18 (×4): 2 via ORAL
  Filled 2018-02-17 (×4): qty 2

## 2018-02-17 MED ORDER — VANCOMYCIN HCL IN DEXTROSE 750-5 MG/150ML-% IV SOLN
750.0000 mg | INTRAVENOUS | Status: DC
  Filled 2018-02-17: qty 150

## 2018-02-17 MED ORDER — HYDROCODONE-ACETAMINOPHEN 5-325 MG PO TABS: 1.0000 | ORAL_TABLET | ORAL | Status: DC | PRN

## 2018-02-17 MED ORDER — HYDROMORPHONE HCL 1 MG/ML IJ SOLN
1.0000 mg | INTRAMUSCULAR | Status: DC | PRN
  Administered 2018-02-17 – 2018-02-18 (×6): 1 mg via INTRAVENOUS
  Filled 2018-02-17 (×5): qty 1

## 2018-02-17 MED ORDER — SODIUM CHLORIDE 0.9 % IV SOLN
2.0000 g | Freq: Two times a day (BID) | INTRAVENOUS | Status: DC
  Administered 2018-02-17 – 2018-02-18 (×3): 2 g via INTRAVENOUS
  Filled 2018-02-17 (×5): qty 2

## 2018-02-17 NOTE — Progress Notes (Addendum)
[facility] [place] NAME: Wendy Arias    MR#:  [id number]  DATE OF BIRTH:  [birth year]  SUBJECTIVE:  Pleuritic right sided chest pain  REVIEW OF SYSTEMS:   Review of Systems  Constitutional: Negative for chills, fever and weight loss.  HENT: Negative for ear discharge, ear pain and nosebleeds.   Eyes: Negative for blurred vision, pain and discharge.  Respiratory: Positive for shortness of breath. Negative for sputum production, wheezing and stridor.   Cardiovascular: Positive for chest pain. Negative for palpitations, orthopnea and PND.  Gastrointestinal: Negative for abdominal pain, diarrhea, nausea and vomiting.  Genitourinary: Negative for frequency and urgency.  Musculoskeletal: Negative for back pain and joint pain.  Neurological: Negative for sensory change, speech change, focal weakness and weakness.  Psychiatric/Behavioral: Negative for depression and hallucinations. The patient is not nervous/anxious.    Tolerating Diet:yes Tolerating PT: ambulatory  DRUG ALLERGIES:   Allergies  Allergen Reactions  . Penicillins Other (See Comments)    Has patient had a PCN reaction causing immediate rash, facial/tongue/throat swelling, SOB or lightheadedness with hypotension: Yes Has patient had a PCN reaction causing severe rash involving mucus membranes or skin necrosis: Unknown Has patient had a PCN reaction that required hospitalization: Unknown Has patient had a PCN reaction occurring within the last 10 years: Unknown If all of the above answers are "NO", then may proceed with Cephalosporin use.    VITALS:  Blood pressure 99/66, pulse 77, temperature 98.4 F (36.9 C), temperature source Oral, resp. rate 18, height 5\' 2"  (1.575 m), weight 50.8 kg, SpO2 98 %.  PHYSICAL EXAMINATION:   Physical Exam  GENERAL:  67 y.o.-year-old patient lying in the bed with no acute distress.  EYES: Pupils equal, round, reactive to light and  accommodation. No scleral icterus. Extraocular muscles intact.  HEENT: Head atraumatic, normocephalic. Oropharynx and nasopharynx clear.  NECK:  Supple, no jugular venous distention. No thyroid enlargement, no tenderness.  LUNGS: Normal breath sounds bilaterally, no wheezing, few right sided rales, no rhonchi. No use of accessory muscles of respiration.  CARDIOVASCULAR: S1, S2 normal. No murmurs, rubs, or gallops.  ABDOMEN: Soft, nontender, nondistended. Bowel sounds present. No organomegaly or mass.  EXTREMITIES: No cyanosis, clubbing or edema b/l.    NEUROLOGIC: Cranial nerves II through XII are intact. No focal Motor or sensory deficits b/l.   PSYCHIATRIC:  patient is alert and oriented x 3.  SKIN: No obvious rash, lesion, or ulcer.   LABORATORY PANEL:  CBC Recent Labs  Lab 02/17/18 0349  WBC 21.4*  HGB 10.2*  HCT 30.8*  PLT 435    Chemistries  Recent Labs  Lab 02/16/18 1525 02/17/18 0349  NA  --  133*  K  --  3.9  CL  --  104  CO2  --  24  GLUCOSE  --  102*  BUN  --  10  CREATININE  --  0.71  CALCIUM  --  8.4*  AST 14*  --   ALT 9  --   ALKPHOS 143*  --   BILITOT 0.2*  --    Cardiac Enzymes Recent Labs  Lab 02/16/18 1411  TROPONINI <0.03   RADIOLOGY:  Dg Chest 2 View  Result Date: 02/16/2018 CLINICAL DATA:  Severe right-sided chest pain this morning. EXAM: CHEST - 2 VIEW COMPARISON:  CT chest dated December 20, 2017. Chest x-ray dated November 28, 2017. FINDINGS: The heart size and mediastinal contours are within normal limits. Normal  pulmonary vascularity. Atherosclerotic calcification of the aortic arch. Persistent but slightly improved opacity in the peripheral right middle lobe. No new focal airspace disease. No pleural effusion or pneumothorax. New moderate compression fracture of L1. IMPRESSION: 1. Persistent airspace disease in the right middle lobe. This could be better evaluated with chest CT, allowing for better direct comparison to prior CT chest from June. 2.  New age-indeterminate moderate L1 compression fracture. Correlate with point tenderness. Electronically Signed   By: Titus Dubin M.D.   On: 02/16/2018 15:02   Ct Angio Chest Pe W/cm &/or Wo Cm  Result Date: 02/16/2018 CLINICAL DATA:  67 year old female with history of right-sided chest pain radiating into her back and shoulder. Recent history of lung biopsy on the right side 1 month ago. History of prior myocardial infarction. Shortness of breath. EXAM: CT ANGIOGRAPHY CHEST WITH CONTRAST TECHNIQUE: Multidetector CT imaging of the chest was performed using the standard protocol during bolus administration of intravenous contrast. Multiplanar CT image reconstructions and MIPs were obtained to evaluate the vascular anatomy. CONTRAST:  9mL ISOVUE-370 IOPAMIDOL (ISOVUE-370) INJECTION 76% COMPARISON:  Chest CT 12/20/2017. FINDINGS: Cardiovascular: No filling defects in the pulmonary arterial tree to suggest underlying pulmonary embolism. Heart size is normal. There is no significant pericardial fluid, thickening or pericardial calcification. Aortic atherosclerosis. No definite coronary artery calcifications are identified. Mediastinum/Nodes: Prominent borderline enlarged low right paratracheal lymph node measuring 1.3 cm in short axis. Subcarinal lymph node also borderline enlarged measuring 1.1 cm in short axis. No definite hilar lymphadenopathy. Esophagus is unremarkable in appearance. No axillary lymphadenopathy. Lungs/Pleura: There continues to be a mass-like area in the lateral segment of the right middle lobe (axial image 57 of series 6) which measures 4.6 x 5.5 cm. "CT angiogram sign" through this region suggest that this is predominantly airspace consolidation. There is an irregular area of enhancement with some central low-attenuation (axial image 58 of series 4) which extends to the pleural surface, concerning for potential small intrapulmonary abscess. Compared to the prior study there is new complex  partially loculated small pleural effusion throughout the right mid to lower hemithorax. Additional areas of scarring and/or subsegmental atelectasis are noted in the right lung base, as well as in the inferior segment of the lingula. No left pleural effusion. Upper Abdomen: Aortic atherosclerosis. Musculoskeletal: New compression fracture of L1 with 80% loss of anterior vertebral body height, well-defined fracture lines, and small amount of paravertebral soft tissue swelling, suggestive of a subacute injury. There are no aggressive appearing lytic or blastic lesions noted in the visualized portions of the skeleton. Review of the MIP images confirms the above findings. IMPRESSION: 1. No evidence of pulmonary embolism. 2. Increasing mass-like area of probable airspace consolidation in the lateral segment of the right middle lobe, within which there is a potential area of intrapulmonary abscess which appears to extend to the pleural surface. Given the presence of what appears to be a very small but complex and loculated right-sided pleural effusion, the possibility of developing bronchopleural fistula should be considered. 3. New likely subacute L1 vertebral body compression fracture with 80% loss of anterior vertebral body height. 4. Aortic atherosclerosis. Aortic Atherosclerosis (ICD10-I70.0). Electronically Signed   By: Vinnie Langton M.D.   On: 02/16/2018 15:58   ASSESSMENT AND PLAN:  Wendy Arias  is a 67 y.o. female with a known history of HTN, hx MI, asthma, osteoporosis, anxiety/depression who presented to the ED with right sided chest pain that started this morning at 7 AM.    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 Has been worked up as outpatient for right middle lung mass and has had negative biopsies. Has been following with Dr. Ashby Dawes (pulm) and Dr. Mike Gip (onc) as an outpatient. WBC 17.1, but patient is not septic. - continue cefepime for now and  clinda oral as out pt per DR Ram - blood cultures negative - pulmonology consult appreciated - cardiothoracic surgery consulted with Dr Genevive Bi noted. Recommends IR to drain fluid however pt declines it - dilaudid for pain - wean O2 as able   *Hypertension- normotensive - continue metoprolol  *Hyperlipidemia- stable - continue zocor  *Anxiety- worsened with shortness of breath - continue xanax, buspar, effexor, and hydroxyzine  *Osteoporosis with L1 compression fracture- read as "new" on CTA chest, but patient is aware that she has this. - takes ibandronate q30 days at home  *Tobacco use - nicotine patch prn  *DVT prophylaxis- heparin  Case discussed with Care Management/Social Worker. Management plans discussed with the patient, family and they are in agreement.  CODE STATUS: DNR  DVT Prophylaxis: heparin  TOTAL TIME TAKING CARE OF THIS PATIENT: *30 minutes.  >50% time spent on counselling and coordination of care  POSSIBLE D/C IN *2-3* DAYS, DEPENDING ON CLINICAL CONDITION.  Note: This dictation was prepared with Dragon dictation along with smaller phrase technology. Any transcriptional errors that result from this process are unintentional.  Fritzi Mandes M.D on 02/17/2018 at 4:34 PM  Between 7am to 6pm - Pager - [phone]  After 6pm go to [email or web address] - password [email or web address]  Office  [phone]  CC: Primary care physician; Glendon Axe, MDPatient ID: Wendy Arias, female   DOB: 05/05/1951, 67 y.o.   MRN: [id number]

## 2018-02-17 NOTE — Progress Notes (Signed)
Patient ID: Wendy Arias, female   DOB: [birth year], 67 y.o.   MRN: [id number]  Chief Complaint  Patient presents with  . Chest Pain  . Shortness of Breath    Referred By Dr. Azell Der Reason for Referral right lung mass  HPI Location, Quality, Duration, Severity, Timing, Context, Modifying Factors, Associated Signs and Symptoms.  Wendy Arias is a 67 y.o. female.  Her problems began several months ago when she began experiencing some right sided chest pain.  Chest x-ray was obtained which revealed a probable right lower lobe and right middle lobe pneumonias.  Over the course of the next several months she has had several CT scans including a bronchoscopy to evaluate this process.  She is been on antibiotics as well.  The bronchoscopy failed to reveal any evidence of malignancy.  She was readmitted to the hospital with continuing right-sided chest pain particularly with deep inspiration.  A repeat CT scan of the chest showed a pneumonia within the right midlung zone with possible small cavity.  There was some pleural effusion present but this appeared relatively small.  I was asked to see her for any consideration of surgical intervention.  She is a lifelong smoker and continues to smoke.  She states she is trying to quit.   Past Medical History:  Diagnosis Date  . Anxiety   . Asthma   . Depression   . Hypertension   . Myocardial infarction ([place])   . Osteoporosis     Past Surgical History:  Procedure Laterality Date  . ABDOMINAL HYSTERECTOMY     partial  . CARDIAC CATHETERIZATION    . CARDIAC SURGERY    . COLONOSCOPY WITH PROPOFOL N/A 04/12/2017   Procedure: COLONOSCOPY WITH PROPOFOL;  Surgeon: Lollie Sails, MD;  Location[facility] ENDOSCOPY;  Service: Endoscopy;  Laterality: N/A;  . CORONARY ANGIOPLASTY    . ENDOBRONCHIAL ULTRASOUND N/A 11/28/2017   Procedure: ENDOBRONCHIAL ULTRASOUND;  Surgeon: Laverle Hobby, MD;  Location: [facility] ORS;  Service: Pulmonary;   Laterality: N/A;  . REDUCTION MAMMAPLASTY      Family History  Problem Relation Age of Onset  . Breast cancer Mother 16  . Hypertension Mother   . Dementia Mother   . Breast cancer Maternal Aunt 70  . Breast cancer Other 35  . Hypertension Father   . Heart disease Father   . Diabetes Father   . Diabetes Sister     Social History Social History   Tobacco Use  . Smoking status: Current Some Day Smoker    Packs/day: 1.50    Years: 20.00    Pack years: 30.00    Types: Cigarettes  . Smokeless tobacco: Never Used  Substance Use Topics  . Alcohol use: Yes    Alcohol/week: 3.0 standard drinks    Types: 1 Glasses of wine, 2 Cans of beer per week    Comment: rarely  . Drug use: Never    Allergies  Allergen Reactions  . Penicillins Other (See Comments)    Has patient had a PCN reaction causing immediate rash, facial/tongue/throat swelling, SOB or lightheadedness with hypotension: Yes Has patient had a PCN reaction causing severe rash involving mucus membranes or skin necrosis: Unknown Has patient had a PCN reaction that required hospitalization: Unknown Has patient had a PCN reaction occurring within the last 10 years: Unknown If all of the above answers are "NO", then may proceed with Cephalosporin use.    Current Facility-Administered Medications  Medication Dose Route Frequency Provider Last  Rate Last Dose  . acetaminophen (TYLENOL) tablet 650 mg  650 mg Oral Q6H PRN Mayo, Pete Pelt, MD       Or  . acetaminophen (TYLENOL) suppository 650 mg  650 mg Rectal Q6H PRN Mayo, Pete Pelt, MD      . ALPRAZolam Duanne Moron) tablet 1 mg  1 mg Oral QID Mayo, Pete Pelt, MD   1 mg at 02/17/18 1019  . busPIRone (BUSPAR) tablet 10 mg  10 mg Oral BID Sela Hua, MD   10 mg at 02/17/18 1020  . ceFEPIme (MAXIPIME) 2 g in sodium chloride 0.9 % 100 mL IVPB  2 g Intravenous Q12H Mayo, Pete Pelt, MD 200 mL/hr at 02/17/18 0755 2 g at 02/17/18 0755  . dicyclomine (BENTYL) capsule 10 mg  10 mg Oral  Q6H PRN Mayo, Pete Pelt, MD      . heparin injection 5,000 Units  5,000 Units Subcutaneous Q8H Mayo, Pete Pelt, MD   5,000 Units at 02/17/18 [phone]  . HYDROcodone-acetaminophen (NORCO/VICODIN) 5-325 MG per tablet 1-2 tablet  1-2 tablet Oral Q4H PRN Fritzi Mandes, MD   2 tablet at 02/17/18 1055  . HYDROmorphone (DILAUDID) injection 1 mg  1 mg Intravenous Q3H PRN Fritzi Mandes, MD      . hydrOXYzine (ATARAX/VISTARIL) tablet 25 mg  25 mg Oral QHS PRN Mayo, Pete Pelt, MD      . metoprolol succinate (TOPROL-XL) 24 hr tablet 50 mg  50 mg Oral Daily Mayo, Pete Pelt, MD   50 mg at 02/17/18 1019  . nicotine (NICODERM CQ - dosed in mg/24 hours) patch 14 mg  14 mg Transdermal Daily Mayo, Pete Pelt, MD   14 mg at 02/17/18 1024  . ondansetron (ZOFRAN) tablet 4 mg  4 mg Oral Q6H PRN Mayo, Pete Pelt, MD       Or  . ondansetron [facility]) injection 4 mg  4 mg Intravenous Q6H PRN Mayo, Pete Pelt, MD      . simvastatin (ZOCOR) tablet 40 mg  40 mg Oral Daily Mayo, Pete Pelt, MD   40 mg at 02/17/18 1020  . tiotropium (SPIRIVA) inhalation capsule 18 mcg  18 mcg Inhalation Daily Mayo, Pete Pelt, MD   18 mcg at 02/17/18 1023  . vancomycin (VANCOCIN) IVPB 750 mg/150 ml premix  750 mg Intravenous Q18H Mayo, Pete Pelt, MD      . venlafaxine XR (EFFEXOR-XR) 24 hr capsule 75 mg  75 mg Oral Q breakfast Mayo, Pete Pelt, MD   75 mg at 02/17/18 1019      Review of Systems A complete review of systems was asked and was negative except for the following positive findings some weight loss and early fatigue.  Cough which was nonproductive.  Blood pressure 103/60, pulse 81, temperature 98.6 F (37 C), temperature source Oral, resp. rate 20, height 5\' 2"  (1.575 m), weight 50.8 kg, SpO2 98 %.  Physical Exam CONSTITUTIONAL:  Pleasant, well-developed, well-nourished, and in no acute distress. EYES: Pupils equal and reactive to light, Sclera non-icteric EARS, NOSE, MOUTH AND THROAT:  The oropharynx was clear.  Dentition is good repair.   Oral mucosa pink and moist. LYMPH NODES:  Lymph nodes in the neck and axillae were normal RESPIRATORY:  Lungs were clear but diminished bilaterally..  Normal respiratory effort without pathologic use of accessory muscles of respiration CARDIOVASCULAR: Heart was regular without murmurs.  There were no carotid bruits. GI: The abdomen was soft, nontender, and nondistended. There were no palpable masses. There was  no hepatosplenomegaly. There were normal bowel sounds in all quadrants. GU:  Rectal deferred.   MUSCULOSKELETAL:  Normal muscle strength and tone.  No clubbing or cyanosis.   SKIN:  There were no pathologic skin lesions.  There were no nodules on palpation. NEUROLOGIC:  Sensation is normal.  Cranial nerves are grossly intact. PSYCH:  Oriented to person, place and time.  Mood and affect are normal.  Data Reviewed Multiple CT scans  I have personally reviewed the patient's imaging, laboratory findings and medical records.    Assessment    The most recent CT scan does show small pleural effusion in the setting of a right midlung pneumonia.  I do not see anything to suggest an empyema at this point.  I would recommend that we continue her antibiotics.    Plan    I did not see any need for any surgical intervention at this time.  We will continue to follow the patient clinically.      Nestor Lewandowsky, MD 02/17/2018, 10:59 AM   Patient ID: Wendy Arias, female   DOB: 09/04/1950, 67 y.o.   MRN: [id number]

## 2018-02-17 NOTE — Progress Notes (Signed)
Pharmacy Antibiotic Note  Wendy Arias is a 67 y.o. female admitted on 02/16/2018 with pneumonia.  Pharmacy has been consulted for vancomycin and cefepime dosing.  Plan: DW 51kg  Vd 36L kei 0.049 hr-1  T1/2 14 hours Vancomycin 750 mg q 18 hours ordered with stacked dosing. Level before 5th dose. Goal trough 15-20  Cefepime 2 grams q 12 hours ordered  Height: 5\' 2"  (157.5 cm) Weight: 112 lb (50.8 kg) IBW/kg (Calculated) : 50.1  Temp (24hrs), Avg:98.3 F (36.8 C), Min:98 F (36.7 C), Max:98.6 F (37 C)  Recent Labs  Lab 02/16/18 1411 02/16/18 1510  WBC 17.1*  --   CREATININE 0.77  --   LATICACIDVEN  --  0.9    Estimated Creatinine Clearance: 54 mL/min (by C-G formula based on SCr of 0.77 mg/dL).    Allergies  Allergen Reactions  . Penicillins Other (See Comments)    Has patient had a PCN reaction causing immediate rash, facial/tongue/throat swelling, SOB or lightheadedness with hypotension: Yes Has patient had a PCN reaction causing severe rash involving mucus membranes or skin necrosis: Unknown Has patient had a PCN reaction that required hospitalization: Unknown Has patient had a PCN reaction occurring within the last 10 years: Unknown If all of the above answers are "NO", then may proceed with Cephalosporin use.    Antimicrobials this admission: Cefepime 8/22, Vancomycin 8/23  >>    >>   Dose adjustments this admission:   Microbiology results: 8/22 BCx: pending 8/23 MRSA PCR: pending      8/22 CXR: Right middle lobe disease  Thank you for allowing pharmacy to be a part of this patient's care.  Quanika Solem S 02/17/2018 12:54 AM

## 2018-02-17 NOTE — Consult Note (Signed)
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 Severe pleuritic chest pain secondary to above. -Continue IV antibiotics. - Patient should be transitioned to oral antibiotics at the time of discharge. Given her PCN allergy would use Clindamycin 300 mg three times daily with concomitant probiotic to prevent C dif risk as patient will require a minimum of 4 weeks of abx.  --Pain control as required.  --follow up in my office in 1-2 weeks from discharge.   Date: 02/17/2018  MRN# [id number] Wendy Arias 68-21-1952  Referring Physician: Dr. Posey Pronto for lung mass  Wendy Arias is a 67 y.o. old female seen in consultation for chief complaint of:    Chief Complaint  Patient presents with  . Chest Pain  . Shortness of Breath    HPI:   The patient is a 67 year old female that I had seen previously, she was noted in the past to have lung mass with mediastinal lymphadenopathy combined with dyspnea on exertion and cough.  Review of imaging at that time, CT chest 11/17/2017 showed a right lower lobe opacity with right paratracheal and subcarinal lymphadenopathy confirmed to be hypermetabolic on subsequent PET scan on 5/28.  She underwent EBUS bronchoscopy on 11/28/2017 with transbronchial biopsies.  Subsequent follow-up all biopsies were negative for malignancy.  She was discussed at tumor board, it was recommended that she undergo needle  biopsy, for which she went on 6/25, however repeat imaging at that time showed improvement in the infiltrates, suggesting inflammation or infection. I subsequently saw the patient in the office on 12/22/2017 and she was given a course of steroids for 4 weeks.  She was also encouraged smoking cessation.  The patient then presented to the hospital on 02/16/2018 with right-sided chest pain that radiated into her back, and was admitted. Imaging showed lung  lung abscess.  Her dyspnea is mild to minimal, her main complaint at this time is chest pain which is predominantly pleuritic in nature.  MRSA screen is negative.  Blood culture negative thus far.  **CT chest 02/16/2018 in comparison with previous on 5/23>> there is new right middle lobe pleural-based consolidation with an area of central necrosis.  This appears to be smaller and in a different location in the previous infiltrate in May, however it appears similar and enlarged compared to the most recent scan on 6/25.  In addition there is pleural thickening, small loculated effusion.  PMHX:   Past Medical History:  Diagnosis Date  . Anxiety   . Asthma   . Depression   . Hypertension   . Myocardial infarction ([place])   . Osteoporosis    Surgical Hx:  Past Surgical History:  Procedure Laterality Date  . ABDOMINAL HYSTERECTOMY     partial  . CARDIAC CATHETERIZATION    . CARDIAC SURGERY    . COLONOSCOPY WITH PROPOFOL N/A 04/12/2017   Procedure: COLONOSCOPY WITH PROPOFOL;  Surgeon: Lollie Sails, MD;  Location: [facility] ENDOSCOPY;  Service: Endoscopy;  Laterality: N/A;  . CORONARY ANGIOPLASTY    . ENDOBRONCHIAL ULTRASOUND N/A 11/28/2017   Procedure: ENDOBRONCHIAL ULTRASOUND;  Surgeon: Laverle Hobby, MD;  Location: [facility] ORS;  Service: Pulmonary;  Laterality: N/A;  . REDUCTION MAMMAPLASTY     Family Hx:  Family History  Problem Relation Age of Onset  . Breast cancer Mother 51  . Hypertension Mother   . Dementia Mother   . Breast cancer Maternal Aunt  70  . Breast cancer Other 35  . Hypertension Father   . Heart disease Father   . Diabetes Father   . Diabetes Sister    Social Hx:   Social History   Tobacco Use  . Smoking status: Current Some Day Smoker    Packs/day: 1.50    Years: 20.00    Pack years: 30.00    Types: Cigarettes  . Smokeless tobacco: Never Used  Substance Use Topics  . Alcohol use: Yes    Alcohol/week: 3.0 standard drinks    Types: 1 Glasses of wine,  2 Cans of beer per week    Comment: rarely  . Drug use: Never   Medication:    Current Facility-Administered Medications:  .  acetaminophen (TYLENOL) tablet 650 mg, 650 mg, Oral, Q6H PRN **OR** acetaminophen (TYLENOL) suppository 650 mg, 650 mg, Rectal, Q6H PRN, Mayo, Pete Pelt, MD .  ALPRAZolam Duanne Moron) tablet 1 mg, 1 mg, Oral, QID, Mayo, Pete Pelt, MD, 1 mg at 02/17/18 1019 .  busPIRone (BUSPAR) tablet 10 mg, 10 mg, Oral, BID, Mayo, Pete Pelt, MD, 10 mg at 02/17/18 1020 .  ceFEPIme (MAXIPIME) 2 g in sodium chloride 0.9 % 100 mL IVPB, 2 g, Intravenous, Q12H, Mayo, Pete Pelt, MD, Stopped at 02/17/18 [phone] .  dicyclomine (BENTYL) capsule 10 mg, 10 mg, Oral, Q6H PRN, Mayo, Pete Pelt, MD .  heparin injection 5,000 Units, 5,000 Units, Subcutaneous, Q8H, Mayo, Pete Pelt, MD, 5,000 Units at 02/17/18 [phone] .  HYDROcodone-acetaminophen (NORCO/VICODIN) 5-325 MG per tablet 1-2 tablet, 1-2 tablet, Oral, Q4H PRN, Fritzi Mandes, MD, 2 tablet at 02/17/18 1055 .  HYDROmorphone (DILAUDID) injection 1 mg, 1 mg, Intravenous, Q3H PRN, Fritzi Mandes, MD, 1 mg at 02/17/18 1035 .  hydrOXYzine (ATARAX/VISTARIL) tablet 25 mg, 25 mg, Oral, QHS PRN, Mayo, Pete Pelt, MD .  metoprolol succinate (TOPROL-XL) 24 hr tablet 50 mg, 50 mg, Oral, Daily, Mayo, Pete Pelt, MD, 50 mg at 02/17/18 1019 .  nicotine (NICODERM CQ - dosed in mg/24 hours) patch 14 mg, 14 mg, Transdermal, Daily, Mayo, Pete Pelt, MD, 14 mg at 02/17/18 1024 .  ondansetron (ZOFRAN) tablet 4 mg, 4 mg, Oral, Q6H PRN **OR** ondansetron (ZOFRAN) injection 4 mg, 4 mg, Intravenous, Q6H PRN, Mayo, Pete Pelt, MD .  simvastatin (ZOCOR) tablet 40 mg, 40 mg, Oral, Daily, Mayo, Pete Pelt, MD, 40 mg at 02/17/18 1020 .  tiotropium (SPIRIVA) inhalation capsule 18 mcg, 18 mcg, Inhalation, Daily, Mayo, Pete Pelt, MD, 18 mcg at 02/17/18 1023 .  vancomycin (VANCOCIN) IVPB 750 mg/150 ml premix, 750 mg, Intravenous, Q18H, Mayo, Pete Pelt, MD .  venlafaxine XR (EFFEXOR-XR) 24 hr capsule 75  mg, 75 mg, Oral, Q breakfast, Mayo, Pete Pelt, MD, 75 mg at 02/17/18 1019   Allergies:  Penicillins  Review of Systems: Gen:  Denies  fever, sweats, chills HEENT: Denies blurred vision, double vision. bleeds, sore throat Cvc:  No dizziness, chest pain. Resp:   Denies cough or sputum production, shortness of breath Gi: Denies swallowing difficulty, stomach pain. Gu:  Denies bladder incontinence, burning urine Ext:   No Joint pain, stiffness. Skin: No skin rash,  hives  Endoc:  No polyuria, polydipsia. Psych: No depression, insomnia. Other:  All other systems were reviewed with the patient and were negative other that what is mentioned in the HPI.   Physical Examination:   VS: BP 103/60 (BP Location: Right Arm)   Pulse 81   Temp 98.6 F (37 C) (Oral)   Resp 20  Ht 5\' 2"  (1.575 m)   Wt 50.8 kg   SpO2 98%   BMI 20.49 kg/m   General Appearance: No distress  Neuro:without focal findings,  speech normal,  HEENT: PERRLA, EOM intact.   Pulmonary: normal breath sounds, No wheezing.  CardiovascularNormal S1,S2.  No m/r/g.   Abdomen: Benign, Soft, non-tender. Renal:  No costovertebral tenderness  GU:  No performed at this time. Endoc: No evident thyromegaly, no signs of acromegaly. Skin:   warm, no rashes, no ecchymosis  Extremities: normal, no cyanosis, clubbing.  Other findings:    LABORATORY PANEL:   CBC Recent Labs  Lab 02/17/18 0349  WBC 21.4*  HGB 10.2*  HCT 30.8*  PLT 435   ------------------------------------------------------------------------------------------------------------------  Chemistries  Recent Labs  Lab 02/16/18 1525 02/17/18 0349  NA  --  133*  K  --  3.9  CL  --  104  CO2  --  24  GLUCOSE  --  102*  BUN  --  10  CREATININE  --  0.71  CALCIUM  --  8.4*  AST 14*  --   ALT 9  --   ALKPHOS 143*  --   BILITOT 0.2*  --     ------------------------------------------------------------------------------------------------------------------  Cardiac Enzymes Recent Labs  Lab 02/16/18 1411  TROPONINI <0.03   ------------------------------------------------------------  RADIOLOGY:  Dg Chest 2 View  Result Date: 02/16/2018 CLINICAL DATA:  Severe right-sided chest pain this morning. EXAM: CHEST - 2 VIEW COMPARISON:  CT chest dated December 20, 2017. Chest x-ray dated November 28, 2017. FINDINGS: The heart size and mediastinal contours are within normal limits. Normal pulmonary vascularity. Atherosclerotic calcification of the aortic arch. Persistent but slightly improved opacity in the peripheral right middle lobe. No new focal airspace disease. No pleural effusion or pneumothorax. New moderate compression fracture of L1. IMPRESSION: 1. Persistent airspace disease in the right middle lobe. This could be better evaluated with chest CT, allowing for better direct comparison to prior CT chest from June. 2. New age-indeterminate moderate L1 compression fracture. Correlate with point tenderness. Electronically Signed   By: Titus Dubin M.D.   On: 02/16/2018 15:02   Ct Angio Chest Pe W/cm &/or Wo Cm  Result Date: 02/16/2018 CLINICAL DATA:  67 year old female with history of right-sided chest pain radiating into her back and shoulder. Recent history of lung biopsy on the right side 1 month ago. History of prior myocardial infarction. Shortness of breath. EXAM: CT ANGIOGRAPHY CHEST WITH CONTRAST TECHNIQUE: Multidetector CT imaging of the chest was performed using the standard protocol during bolus administration of intravenous contrast. Multiplanar CT image reconstructions and MIPs were obtained to evaluate the vascular anatomy. CONTRAST:  68mL ISOVUE-370 IOPAMIDOL (ISOVUE-370) INJECTION 76% COMPARISON:  Chest CT 12/20/2017. FINDINGS: Cardiovascular: No filling defects in the pulmonary arterial tree to suggest underlying pulmonary  embolism. Heart size is normal. There is no significant pericardial fluid, thickening or pericardial calcification. Aortic atherosclerosis. No definite coronary artery calcifications are identified. Mediastinum/Nodes: Prominent borderline enlarged low right paratracheal lymph node measuring 1.3 cm in short axis. Subcarinal lymph node also borderline enlarged measuring 1.1 cm in short axis. No definite hilar lymphadenopathy. Esophagus is unremarkable in appearance. No axillary lymphadenopathy. Lungs/Pleura: There continues to be a mass-like area in the lateral segment of the right middle lobe (axial image 57 of series 6) which measures 4.6 x 5.5 cm. "CT angiogram sign" through this region suggest that this is predominantly airspace consolidation. There is an irregular area of enhancement with some central low-attenuation (axial  image 58 of series 4) which extends to the pleural surface, concerning for potential small intrapulmonary abscess. Compared to the prior study there is new complex partially loculated small pleural effusion throughout the right mid to lower hemithorax. Additional areas of scarring and/or subsegmental atelectasis are noted in the right lung base, as well as in the inferior segment of the lingula. No left pleural effusion. Upper Abdomen: Aortic atherosclerosis. Musculoskeletal: New compression fracture of L1 with 80% loss of anterior vertebral body height, well-defined fracture lines, and small amount of paravertebral soft tissue swelling, suggestive of a subacute injury. There are no aggressive appearing lytic or blastic lesions noted in the visualized portions of the skeleton. Review of the MIP images confirms the above findings. IMPRESSION: 1. No evidence of pulmonary embolism. 2. Increasing mass-like area of probable airspace consolidation in the lateral segment of the right middle lobe, within which there is a potential area of intrapulmonary abscess which appears to extend to the pleural  surface. Given the presence of what appears to be a very small but complex and loculated right-sided pleural effusion, the possibility of developing bronchopleural fistula should be considered. 3. New likely subacute L1 vertebral body compression fracture with 80% loss of anterior vertebral body height. 4. Aortic atherosclerosis. Aortic Atherosclerosis (ICD10-I70.0). Electronically Signed   By: Vinnie Langton M.D.   On: 02/16/2018 15:58       Thank  you for the consultation and for allowing [facility] to assist in the care of your patient. Our recommendations are noted above.  Please contact us if we can be of further service.   Marda Stalker, M.D., F.C.C.P.  Board Certified in Internal Medicine, Pulmonary Medicine, [place], and Sleep Medicine.  [facility] Office Number: [phone]   02/17/2018

## 2018-02-17 NOTE — Telephone Encounter (Signed)
-----   Message from Laverle Hobby, MD sent at 02/17/2018  2:55 PM EDT ----- Regarding: HFU Needs hfu in 1-2 weeks for lung abscess.

## 2018-02-18 LAB — HIV ANTIBODY (ROUTINE TESTING W REFLEX): HIV Screen 4th Generation wRfx: NONREACTIVE

## 2018-02-18 MED ORDER — FENTANYL 25 MCG/HR TD PT72
25.0000 ug | MEDICATED_PATCH | TRANSDERMAL | Status: DC
  Administered 2018-02-18: 25 ug via TRANSDERMAL
  Filled 2018-02-18: qty 1

## 2018-02-18 MED ORDER — IBUPROFEN 400 MG PO TABS
400.0000 mg | ORAL_TABLET | Freq: Four times a day (QID) | ORAL | Status: DC
  Administered 2018-02-18 – 2018-02-19 (×5): 400 mg via ORAL
  Filled 2018-02-18 (×5): qty 1

## 2018-02-18 MED ORDER — HYDROMORPHONE HCL 1 MG/ML IJ SOLN
1.0000 mg | Freq: Three times a day (TID) | INTRAMUSCULAR | Status: DC | PRN
  Administered 2018-02-18 – 2018-02-19 (×2): 1 mg via INTRAVENOUS
  Filled 2018-02-18 (×2): qty 1

## 2018-02-18 MED ORDER — SODIUM CHLORIDE 0.9 % IV SOLN
INTRAVENOUS | Status: DC | PRN
  Administered 2018-02-18: 250 mL via INTRAVENOUS

## 2018-02-18 NOTE — Progress Notes (Signed)
  Patient ID: Wendy Arias, female   DOB: 07/16/1950, 67 y.o.   MRN: [id number]  HISTORY: Overall she states that she has continued pain on her right side.  She is getting quite frustrated with this.   Vitals:   02/18/18 0450 02/18/18 0758  BP: (!) 128/95 109/67  Pulse: 100 94  Resp: (!) 24 (!) 24  Temp: 97.8 F (36.6 C) 97.9 F (36.6 C)  SpO2: 93% 94%     EXAM:    Resp: Lungs are clear bilaterally.  No respiratory distress, normal effort. Heart:  Regular without murmurs Abd:  Abdomen is soft, non distended and non tender. No masses are palpable.  There is no rebound and no guarding.  Neurological: Alert and oriented to person, place, and time. Coordination normal.  Skin: Skin is warm and dry. No rash noted. No diaphoretic. No erythema. No pallor.  Psychiatric: Normal mood and affect. Normal behavior. Judgment and thought content normal.    ASSESSMENT: Overall she seems to be slowly improving.  I did not see any need for any surgical intervention at this time.   PLAN:   She is to be treated as an outpatient with 3 weeks of clindamycin.  I will sign off now.  If there is anything I can do to help please not hesitate to call.    Nestor Lewandowsky, MDPatient ID: Wendy Arias, female   DOB: [birth year], 67 y.o.   MRN: [id number]

## 2018-02-18 NOTE — Progress Notes (Signed)
[facility] [place] NAME: Wendy Arias    MR#:  [id number]  DATE OF BIRTH:  1951-01-14  SUBJECTIVE:  Pleuritic right sided chest pain  REVIEW OF SYSTEMS:   Review of Systems  Constitutional: Negative for chills, fever and weight loss.  HENT: Negative for ear discharge, ear pain and nosebleeds.   Eyes: Negative for blurred vision, pain and discharge.  Respiratory: Positive for shortness of breath. Negative for sputum production, wheezing and stridor.   Cardiovascular: Positive for chest pain. Negative for palpitations, orthopnea and PND.  Gastrointestinal: Negative for abdominal pain, diarrhea, nausea and vomiting.  Genitourinary: Negative for frequency and urgency.  Musculoskeletal: Negative for back pain and joint pain.  Neurological: Negative for sensory change, speech change, focal weakness and weakness.  Psychiatric/Behavioral: Negative for depression and hallucinations. The patient is not nervous/anxious.    Tolerating Diet:yes Tolerating PT: ambulatory  DRUG ALLERGIES:   Allergies  Allergen Reactions  . Penicillins Other (See Comments)    Has patient had a PCN reaction causing immediate rash, facial/tongue/throat swelling, SOB or lightheadedness with hypotension: Yes Has patient had a PCN reaction causing severe rash involving mucus membranes or skin necrosis: Unknown Has patient had a PCN reaction that required hospitalization: Unknown Has patient had a PCN reaction occurring within the last 10 years: Unknown If all of the above answers are "NO", then may proceed with Cephalosporin use.    VITALS:  Blood pressure 109/67, pulse 94, temperature 97.9 F (36.6 C), temperature source Oral, resp. rate (!) 24, height 5\' 2"  (1.575 m), weight 50.8 kg, SpO2 94 %.  PHYSICAL EXAMINATION:   Physical Exam  GENERAL:  67 y.o.-year-old patient lying in the bed with no acute distress.  EYES: Pupils equal, round, reactive to light  and accommodation. No scleral icterus. Extraocular muscles intact.  HEENT: Head atraumatic, normocephalic. Oropharynx and nasopharynx clear.  NECK:  Supple, no jugular venous distention. No thyroid enlargement, no tenderness.  LUNGS: Normal breath sounds bilaterally, no wheezing, few right sided rales, no rhonchi. No use of accessory muscles of respiration.  CARDIOVASCULAR: S1, S2 normal. No murmurs, rubs, or gallops.  ABDOMEN: Soft, nontender, nondistended. Bowel sounds present. No organomegaly or mass.  EXTREMITIES: No cyanosis, clubbing or edema b/l.    NEUROLOGIC: Cranial nerves II through XII are intact. No focal Motor or sensory deficits b/l.   PSYCHIATRIC:  patient is alert and oriented x 3.  SKIN: No obvious rash, lesion, or ulcer.   LABORATORY PANEL:  CBC Recent Labs  Lab 02/17/18 0349  WBC 21.4*  HGB 10.2*  HCT 30.8*  PLT 435    Chemistries  Recent Labs  Lab 02/16/18 1525 02/17/18 0349  NA  --  133*  K  --  3.9  CL  --  104  CO2  --  24  GLUCOSE  --  102*  BUN  --  10  CREATININE  --  0.71  CALCIUM  --  8.4*  AST 14*  --   ALT 9  --   ALKPHOS 143*  --   BILITOT 0.2*  --    Cardiac Enzymes Recent Labs  Lab 02/16/18 1411  TROPONINI <0.03   RADIOLOGY:  Dg Chest 2 View  Result Date: 02/16/2018 CLINICAL DATA:  Severe right-sided chest pain this morning. EXAM: CHEST - 2 VIEW COMPARISON:  CT chest dated December 20, 2017. Chest x-ray dated November 28, 2017. FINDINGS: The heart size and mediastinal contours are within normal limits.  Normal pulmonary vascularity. Atherosclerotic calcification of the aortic arch. Persistent but slightly improved opacity in the peripheral right middle lobe. No new focal airspace disease. No pleural effusion or pneumothorax. New moderate compression fracture of L1. IMPRESSION: 1. Persistent airspace disease in the right middle lobe. This could be better evaluated with chest CT, allowing for better direct comparison to prior CT chest from June.  2. New age-indeterminate moderate L1 compression fracture. Correlate with point tenderness. Electronically Signed   By: Titus Dubin M.D.   On: 02/16/2018 15:02   Ct Angio Chest Pe W/cm &/or Wo Cm  Result Date: 02/16/2018 CLINICAL DATA:  67 year old female with history of right-sided chest pain radiating into her back and shoulder. Recent history of lung biopsy on the right side 1 month ago. History of prior myocardial infarction. Shortness of breath. EXAM: CT ANGIOGRAPHY CHEST WITH CONTRAST TECHNIQUE: Multidetector CT imaging of the chest was performed using the standard protocol during bolus administration of intravenous contrast. Multiplanar CT image reconstructions and MIPs were obtained to evaluate the vascular anatomy. CONTRAST:  70mL ISOVUE-370 IOPAMIDOL (ISOVUE-370) INJECTION 76% COMPARISON:  Chest CT 12/20/2017. FINDINGS: Cardiovascular: No filling defects in the pulmonary arterial tree to suggest underlying pulmonary embolism. Heart size is normal. There is no significant pericardial fluid, thickening or pericardial calcification. Aortic atherosclerosis. No definite coronary artery calcifications are identified. Mediastinum/Nodes: Prominent borderline enlarged low right paratracheal lymph node measuring 1.3 cm in short axis. Subcarinal lymph node also borderline enlarged measuring 1.1 cm in short axis. No definite hilar lymphadenopathy. Esophagus is unremarkable in appearance. No axillary lymphadenopathy. Lungs/Pleura: There continues to be a mass-like area in the lateral segment of the right middle lobe (axial image 57 of series 6) which measures 4.6 x 5.5 cm. "CT angiogram sign" through this region suggest that this is predominantly airspace consolidation. There is an irregular area of enhancement with some central low-attenuation (axial image 58 of series 4) which extends to the pleural surface, concerning for potential small intrapulmonary abscess. Compared to the prior study there is new  complex partially loculated small pleural effusion throughout the right mid to lower hemithorax. Additional areas of scarring and/or subsegmental atelectasis are noted in the right lung base, as well as in the inferior segment of the lingula. No left pleural effusion. Upper Abdomen: Aortic atherosclerosis. Musculoskeletal: New compression fracture of L1 with 80% loss of anterior vertebral body height, well-defined fracture lines, and small amount of paravertebral soft tissue swelling, suggestive of a subacute injury. There are no aggressive appearing lytic or blastic lesions noted in the visualized portions of the skeleton. Review of the MIP images confirms the above findings. IMPRESSION: 1. No evidence of pulmonary embolism. 2. Increasing mass-like area of probable airspace consolidation in the lateral segment of the right middle lobe, within which there is a potential area of intrapulmonary abscess which appears to extend to the pleural surface. Given the presence of what appears to be a very small but complex and loculated right-sided pleural effusion, the possibility of developing bronchopleural fistula should be considered. 3. New likely subacute L1 vertebral body compression fracture with 80% loss of anterior vertebral body height. 4. Aortic atherosclerosis. Aortic Atherosclerosis (ICD10-I70.0). Electronically Signed   By: Vinnie Langton M.D.   On: 02/16/2018 15:58   ASSESSMENT AND PLAN:  Addaleigh Nicholls  is a 67 y.o. female with a known history of HTN, hx MI, asthma, osteoporosis, anxiety/depression who presented to the ED with right sided chest pain that started this morning at 7 AM.    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 Has been worked up as outpatient for right middle lung mass and has had negative biopsies. Has been following with Dr. Ashby Dawes (pulm) and Dr. Mike Gip (onc) as an outpatient. WBC 17.1, but patient is not septic. - continue cefepime for now  and clinda oral as out pt per DR Ram - blood cultures negative - pulmonology consult appreciated - cardiothoracic surgery consulted with Dr Genevive Bi noted. Recommends IR to drain fluid however pt declines it - wean off dilaudid for pain, added oral norco and added duragesic patch - wean O2 as able   *Hypertension- normotensive - continue metoprolol  *Hyperlipidemia- stable - continue zocor  *Anxiety- worsened with shortness of breath - continue xanax, buspar, effexor, and hydroxyzine  *Osteoporosis with L1 compression fracture- read as "new" on CTA chest, but patient is aware that she has this. - takes ibandronate q30 days at home  *Tobacco use - nicotine patch prn  *DVT prophylaxis- heparin  If remains stable d/c in am--pt agreeable D/w dr Genevive Bi  Case discussed with Care Management/Social Worker. Management plans discussed with the patient, family and they are in agreement.  CODE STATUS: DNR  DVT Prophylaxis: heparin  TOTAL TIME TAKING CARE OF THIS PATIENT: *30 minutes.  >50% time spent on counselling and coordination of care  POSSIBLE D/C IN *1 DAYS, DEPENDING ON CLINICAL CONDITION.  Note: This dictation was prepared with Dragon dictation along with smaller phrase technology. Any transcriptional errors that result from this process are unintentional.  Fritzi Mandes M.D on 02/18/2018 at 8:35 AM  Between 7am to 6pm - Pager - [phone]  After 6pm go to [email or web address] - password [email or web address] [facility]  Office  [phone]  CC: Primary care physician; Glendon Axe, MDPatient ID: Karmen Bongo, female   DOB: [birth year], 67 y.o.   MRN: [id number]

## 2018-02-18 NOTE — Progress Notes (Signed)
Pt complained of severe chest pain. VSS. MD aware. Orders for round the clock ibuprofen ordered. Pt sitting up in bed with oxygen on.

## 2018-02-19 LAB — CBC
HEMATOCRIT: 27.4 % — AB (ref 35.0–47.0)
Hemoglobin: 9 g/dL — ABNORMAL LOW (ref 12.0–16.0)
MCH: 30.1 pg (ref 26.0–34.0)
MCHC: 33 g/dL (ref 32.0–36.0)
MCV: 91.2 fL (ref 80.0–100.0)
PLATELETS: 406 10*3/uL (ref 150–440)
RBC: 3 MIL/uL — ABNORMAL LOW (ref 3.80–5.20)
RDW: 18.4 % — AB (ref 11.5–14.5)
WBC: 16.3 10*3/uL — AB (ref 3.6–11.0)

## 2018-02-19 MED ORDER — SACCHAROMYCES BOULARDII 250 MG PO CAPS: 250.0000 mg | ORAL_CAPSULE | Freq: Two times a day (BID) | ORAL | 0 refills | Status: DC

## 2018-02-19 MED ORDER — HYDROCODONE-ACETAMINOPHEN 5-325 MG PO TABS: 1.0000 | ORAL_TABLET | Freq: Four times a day (QID) | ORAL | 0 refills | Status: DC | PRN

## 2018-02-19 MED ORDER — FENTANYL 25 MCG/HR TD PT72: 25.0000 ug | MEDICATED_PATCH | TRANSDERMAL | 0 refills | Status: DC

## 2018-02-19 MED ORDER — IBUPROFEN 400 MG PO TABS: 400.0000 mg | ORAL_TABLET | Freq: Four times a day (QID) | ORAL | 0 refills | Status: DC

## 2018-02-19 MED ORDER — CLINDAMYCIN HCL 150 MG PO CAPS
300.0000 mg | ORAL_CAPSULE | Freq: Three times a day (TID) | ORAL | Status: DC
  Administered 2018-02-19: 300 mg via ORAL
  Filled 2018-02-19 (×2): qty 2

## 2018-02-19 MED ORDER — CLINDAMYCIN HCL 300 MG PO CAPS: 300.0000 mg | ORAL_CAPSULE | Freq: Three times a day (TID) | ORAL | 0 refills | Status: AC

## 2018-02-19 MED ORDER — SACCHAROMYCES BOULARDII 250 MG PO CAPS
250.0000 mg | ORAL_CAPSULE | Freq: Two times a day (BID) | ORAL | Status: DC
  Administered 2018-02-19: 250 mg via ORAL
  Filled 2018-02-19 (×2): qty 1

## 2018-02-19 NOTE — Discharge Summary (Signed)
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 Anxiety   . Asthma   . Depression   . Hypertension   . Myocardial infarction ([place])   . Osteoporosis     HOSPITAL COURSE:  GaleDouttis a67 y.o.femalewith a known history of HTN, hx MI, asthma, osteoporosis, anxiety/depression who presented tothe ED with right sided chest pain that started this morning at 7 AM.   *Right Midlung abscess and concern for bronchopleural fistula- seen on CTA chest. Has been worked up as outpatient for right middle lung mass and has had negative biopsies. Has been following with Dr. Ashby Dawes (pulm) and Dr. Mike Gip (onc) as an outpatient. WBC 17.1, but patient is not septic. - continue cefepime for now and changed to clinda oral as out pt per DR Ram for [redacted] weeks along with probiotic - blood cultures negative - pulmonology consult appreciated - cardiothoracic surgery consulted with Dr Genevive Bi noted. - Recommends IR to drain fluid ---d/w Dr Barbie Banner IR--not adequate fluid for drain - wean off dilaudid for pain, added oral norco and added duragesic patch - sats 92-93% RA on ambulation  *Hypertension- normotensive - continue metoprolol  *Hyperlipidemia- stable - continue zocor  *Anxiety- worsened with shortness of breath - continue xanax, buspar, effexor, and hydroxyzine  *Osteoporosis with L1 compression  fracture- read as "new" on CTA chest, but patient is aware that she has this. - takes ibandronate q30 days at home  *Tobacco use - nicotine patch prn  *DVT prophylaxis- heparin  Spoke with sister in the room Will d/c her today to go home  CONSULTS OBTAINED:  Treatment Team:  Nestor Lewandowsky, MD  DRUG ALLERGIES:   Allergies  Allergen Reactions  . Penicillins Other (See Comments)    Has patient had a PCN reaction causing immediate rash, facial/tongue/throat swelling, SOB or lightheadedness with hypotension: Yes Has patient had a PCN reaction causing severe rash involving mucus membranes or skin necrosis: Unknown Has patient had a PCN reaction that required hospitalization: Unknown Has patient had a PCN reaction occurring within the last 10 years: Unknown If all of the above answers are "NO", then may proceed with Cephalosporin use.    DISCHARGE MEDICATIONS:   Allergies as of 02/19/2018      Reactions   Penicillins Other (See Comments)   Has patient had a PCN reaction causing immediate rash, facial/tongue/throat swelling, SOB or lightheadedness with hypotension: Yes Has patient had a PCN reaction causing severe rash involving mucus membranes or skin necrosis: Unknown Has patient had a PCN reaction that required hospitalization: Unknown Has patient had a PCN reaction occurring within the last 10 years: Unknown If all of the above answers are "NO", then may proceed with Cephalosporin use.      Medication List    STOP taking these medications   predniSONE 10 MG tablet Commonly known as:  DELTASONE     TAKE these medications   ALPRAZolam 1 MG tablet Commonly known  as:  XANAX Take 1 mg by mouth 3 (three) times daily as needed for anxiety.   busPIRone 10 MG tablet Commonly known as:  BUSPAR Take 15 mg by mouth 2 (two) times daily.   clindamycin 300 MG capsule Commonly known as:  CLEOCIN Take 1 capsule (300 mg total) by mouth every 8 (eight) hours for 17 days.    dicyclomine 10 MG capsule Commonly known as:  BENTYL Take 10 mg by mouth every 6 (six) hours as needed for spasms.   fentaNYL 25 MCG/HR patch Commonly known as:  DURAGESIC - dosed mcg/hr Place 1 patch (25 mcg total) onto the skin every 3 (three) days. Start taking on:  02/21/2018   HYDROcodone-acetaminophen 5-325 MG tablet Commonly known as:  NORCO/VICODIN Take 1-2 tablets by mouth every 6 (six) hours as needed for moderate pain or severe pain.   hydrOXYzine 25 MG tablet Commonly known as:  ATARAX/VISTARIL Take 25-100 mg by mouth as needed (sleep).   ibandronate 150 MG tablet Commonly known as:  BONIVA Take 150 mg by mouth every 30 (thirty) days. Take in the morning with a full glass of water, on an empty stomach, and do not take anything else by mouth or lie down for the next 30 min.   ibuprofen 400 MG tablet Commonly known as:  ADVIL,MOTRIN Take 1 tablet (400 mg total) by mouth every 6 (six) hours.   metoprolol succinate 50 MG 24 hr tablet Commonly known as:  TOPROL-XL Take 50 mg by mouth daily. Take with or immediately following a meal.   saccharomyces boulardii 250 MG capsule Commonly known as:  FLORASTOR Take 1 capsule (250 mg total) by mouth 2 (two) times daily for 18 days.   simvastatin 40 MG tablet Commonly known as:  ZOCOR Take 40 mg by mouth daily.   tiotropium 18 MCG inhalation capsule Commonly known as:  SPIRIVA Place 1 capsule (18 mcg total) into inhaler and inhale daily.   venlafaxine XR 75 MG 24 hr capsule Commonly known as:  EFFEXOR-XR Take 75 mg by mouth daily with breakfast.   vitamin B-12 1000 MCG tablet Commonly known as:  CYANOCOBALAMIN Take 1,000 mcg by mouth daily.   zolpidem 10 MG tablet Commonly known as:  AMBIEN Take 1 tablet (10MG ) by mouth at bedtime as needed for sleep - may take additional  tablet (5MG ) if needed       If you experience worsening of your admission symptoms, develop shortness of breath, life threatening emergency,  suicidal or homicidal thoughts you must seek medical attention immediately by calling 911 or calling your MD immediately  if symptoms less severe.  You Must read complete instructions/literature along with all the possible adverse reactions/side effects for all the Medicines you take and that have been prescribed to you. Take any new Medicines after you have completely understood and accept all the possible adverse reactions/side effects.   Please note  You were cared for by a hospitalist during your hospital stay. If you have any questions about your discharge medications or the care you received while you were in the hospital after you are discharged, you can call the unit and asked to speak with the hospitalist on call if the hospitalist that took care of you is not available. Once you are discharged, your primary care physician will handle any further medical issues. Please note that NO REFILLS for any discharge medications will be authorized once you are discharged, as it is imperative that you return to your primary care  physician (or establish a relationship with a primary care physician if you do not have one) for your aftercare needs so that they can reassess your need for medications and monitor your lab values. Today   SUBJECTIVE   Some right pleuritic CP ambulated well  VITAL SIGNS:  Blood pressure 104/65, pulse 75, temperature 98.2 F (36.8 C), temperature source Oral, resp. rate 18, height 5\' 2"  (1.575 m), weight 50.8 kg, SpO2 95 %.  I/O:    Intake/Output Summary (Last 24 hours) at 02/19/2018 1139 Last data filed at 02/19/2018 0850 Gross per 24 hour  Intake 218.79 ml  Output 0 ml  Net 218.79 ml    PHYSICAL EXAMINATION:  GENERAL:  67 y.o.-year-old patient lying in the bed with no acute distress. Thin,cachectic EYES: Pupils equal, round, reactive to light and accommodation. No scleral icterus. Extraocular muscles intact.  HEENT: Head atraumatic, normocephalic. Oropharynx and  nasopharynx clear.  NECK:  Supple, no jugular venous distention. No thyroid enlargement, no tenderness.  LUNGS: Normal breath sounds bilaterally, no wheezing, rales,rhonchi or crepitation. No use of accessory muscles of respiration.  CARDIOVASCULAR: S1, S2 normal. No murmurs, rubs, or gallops.  ABDOMEN: Soft, non-tender, non-distended. Bowel sounds present. No organomegaly or mass.  EXTREMITIES: No pedal edema, cyanosis, or clubbing.  NEUROLOGIC: Cranial nerves II through XII are intact. Muscle strength 5/5 in all extremities. Sensation intact. Gait not checked.  PSYCHIATRIC: The patient is alert and oriented x 3.  SKIN: No obvious rash, lesion, or ulcer.   DATA REVIEW:   CBC  Recent Labs  Lab 02/19/18 1058  WBC 16.3*  HGB 9.0*  HCT 27.4*  PLT 406    Chemistries  Recent Labs  Lab 02/16/18 1525 02/17/18 0349  NA  --  133*  K  --  3.9  CL  --  104  CO2  --  24  GLUCOSE  --  102*  BUN  --  10  CREATININE  --  0.71  CALCIUM  --  8.4*  AST 14*  --   ALT 9  --   ALKPHOS 143*  --   BILITOT 0.2*  --     Microbiology Results   Recent Results (from the past 240 hour(s))  Blood Culture (routine x 2)     Status: None (Preliminary result)   Collection Time: 02/16/18  4:51 PM  Result Value Ref Range Status   Specimen Description BLOOD RIGHT ANTECUBITAL  Final   Special Requests   Final    BOTTLES DRAWN AEROBIC AND ANAEROBIC Blood Culture results may not be optimal due to an excessive volume of blood received in culture bottles   Culture   Final    NO GROWTH 3 DAYS Performed at [facility], [address] [place], [place] [address]    Report Status PENDING  Incomplete  Blood Culture (routine x 2)     Status: None (Preliminary result)   Collection Time: 02/16/18  4:51 PM  Result Value Ref Range Status   Specimen Description BLOOD BLOOD RIGHT HAND  Final   Special Requests   Final    BOTTLES DRAWN AEROBIC AND ANAEROBIC Blood Culture results may not be optimal  due to an excessive volume of blood received in culture bottles   Culture   Final    NO GROWTH 3 DAYS Performed at [facility], [address] [place], [place] [address]    Report Status PENDING  Incomplete  MRSA PCR Screening     Status: None  Collection Time: 02/17/18 11:45 AM  Result Value Ref Range Status   MRSA by PCR NEGATIVE NEGATIVE Final    Comment:        The GeneXpert MRSA Assay (FDA approved for NASAL specimens only), is one component of a comprehensive MRSA colonization surveillance program. It is not intended to diagnose MRSA infection nor to guide or monitor treatment for MRSA infections. Performed at [facility], [address] [place], [place] [address]     RADIOLOGY:  No results found.   Management plans discussed with the patient, family and they are in agreement.  CODE STATUS:     Code Status Orders  (From admission, onward)         Start     Ordered   02/16/18 1843  Do not attempt resuscitation (DNR)  Continuous    Question Answer Comment  In the event of cardiac or respiratory ARREST Do not call a "code blue"   In the event of cardiac or respiratory ARREST Do not perform Intubation, CPR, defibrillation or ACLS   In the event of cardiac or respiratory ARREST Use medication by any route, position, wound care, and other measures to relive pain and suffering. May use oxygen, suction and manual treatment of airway obstruction as needed for comfort.      02/16/18 1842        Code Status History    Date Active Date Inactive Code Status Order ID Comments User Context   11/03/2017 0336 11/04/2017 1928 Full Code [id number]  Amelia Jo, MD Inpatient      TOTAL TIME TAKING CARE OF THIS PATIENT: *40* minutes.    Fritzi Mandes M.D on 02/19/2018 at 11:39 AM  Between 7am to 6pm - Pager - [phone] After 6pm go to [email or web address] - password [email or web address] [facility]  Office  [phone]  CC: Primary care  physician; Glendon Axe, MD

## 2018-02-19 NOTE — Progress Notes (Signed)
Patient ambulated using pulse ox. Patient's O2 sats at 95% at rest and with walking ranged from 94%-90%. Patient tolerated walk well.

## 2018-02-19 NOTE — Discharge Instructions (Signed)
Incentive spirometer

## 2018-02-19 NOTE — Progress Notes (Signed)
Discharge teaching given to patient, patient verbalized understanding and had no questions. Patient IV removed by the patient. Patient will be transported home by family. All patient belongings gathered prior to leaving.

## 2018-02-21 LAB — CULTURE, BLOOD (ROUTINE X 2)
Culture: NO GROWTH
Culture: NO GROWTH

## 2018-02-22 NOTE — Telephone Encounter (Signed)
Patient scheduled 03/10/18

## 2018-02-28 DIAGNOSIS — J851 Abscess of lung with pneumonia: Secondary | ICD-10-CM | POA: Diagnosis not present

## 2018-02-28 DIAGNOSIS — E7800 Pure hypercholesterolemia, unspecified: Secondary | ICD-10-CM | POA: Diagnosis not present

## 2018-02-28 DIAGNOSIS — K219 Gastro-esophageal reflux disease without esophagitis: Secondary | ICD-10-CM | POA: Diagnosis not present

## 2018-02-28 DIAGNOSIS — R05 Cough: Secondary | ICD-10-CM | POA: Diagnosis not present

## 2018-02-28 DIAGNOSIS — I10 Essential (primary) hypertension: Secondary | ICD-10-CM | POA: Diagnosis not present

## 2018-03-01 DIAGNOSIS — K219 Gastro-esophageal reflux disease without esophagitis: Secondary | ICD-10-CM | POA: Insufficient documentation

## 2018-03-01 DIAGNOSIS — E7800 Pure hypercholesterolemia, unspecified: Secondary | ICD-10-CM | POA: Insufficient documentation

## 2018-03-02 ENCOUNTER — Telehealth: Payer: Self-pay | Admitting: Internal Medicine

## 2018-03-02 NOTE — Telephone Encounter (Signed)
Patient sister calling stating patient is out on her Hydrocodone And was hoping to get another refill on this  Patient won't be in to see [place] until 03/10/18  If we are able to please send to [facility] in [place]  Please advise

## 2018-03-03 ENCOUNTER — Other Ambulatory Visit: Payer: Self-pay | Admitting: Internal Medicine

## 2018-03-03 MED ORDER — HYDROCODONE-ACETAMINOPHEN 5-325 MG PO TABS: 1.0000 | ORAL_TABLET | Freq: Four times a day (QID) | ORAL | 0 refills | Status: DC | PRN

## 2018-03-07 ENCOUNTER — Other Ambulatory Visit: Payer: Self-pay | Admitting: Pharmacist

## 2018-03-07 NOTE — Patient Outreach (Signed)
[facility]) Care Management  03/07/2018   Wendy Arias [birth year] [id number]  Subjective:   67 year old female referred to [facility] for 30 day post discharge medication review.  PMHx includes, but not limited to, HTN, hx MI, HLD, osteoporosis, depression/anxiety.  Contacted patient today; her sister, Wendy Arias (on [place]) was available. HIPAA verifiers identified. Wendy Arias was able to review medications with me today. She noted that over the past year, Wendy Arias's confusion and mental cloudiness has gotten significantly worse. She is constantly confused. The family manages her medications, and come to her house twice daily to administer her medications. They are extremely concerned about her ability to live safely by herself.  Objective:   Current Medications:  Current Outpatient Medications  Medication Sig Dispense Refill  . ALPRAZolam (XANAX) 1 MG tablet Take 1 mg by mouth 3 (three) times daily as needed for anxiety.   2  . busPIRone (BUSPAR) 10 MG tablet Take 15 mg by mouth 2 (two) times daily.     . clindamycin (CLEOCIN) 300 MG capsule Take 1 capsule (300 mg total) by mouth every 8 (eight) hours for 17 days. 51 capsule 0  . fentaNYL (DURAGESIC - DOSED MCG/HR) 25 MCG/HR patch Place 1 patch (25 mcg total) onto the skin every 3 (three) days. 5 patch 0  . hydrOXYzine (ATARAX/VISTARIL) 25 MG tablet Take 25-100 mg by mouth as needed (sleep).   0  . ibuprofen (ADVIL,MOTRIN) 400 MG tablet Take 1 tablet (400 mg total) by mouth every 6 (six) hours. 30 tablet 0  . metoprolol succinate (TOPROL-XL) 50 MG 24 hr tablet Take 50 mg by mouth daily. Take with or immediately following a meal.    . simvastatin (ZOCOR) 20 MG tablet Take 40 mg by mouth daily.     Marland Kitchen venlafaxine XR (EFFEXOR-XR) 75 MG 24 hr capsule Take 75 mg by mouth daily with breakfast.     . vitamin B-12 (CYANOCOBALAMIN) 1000 MCG tablet Take 1,000 mcg by mouth daily.    Marland Kitchen dicyclomine (BENTYL) 10 MG capsule Take 10 mg  by mouth every 6 (six) hours as needed for spasms.    Marland Kitchen HYDROcodone-acetaminophen (NORCO/VICODIN) 5-325 MG tablet Take 1-2 tablets by mouth every 6 (six) hours as needed for moderate pain or severe pain. (Patient not taking: Reported on 03/07/2018) 20 tablet 0  . ibandronate (BONIVA) 150 MG tablet Take 150 mg by mouth every 30 (thirty) days. Take in the morning with a full glass of water, on an empty stomach, and do not take anything else by mouth or lie down for the next 30 min.    . tiotropium (SPIRIVA) 18 MCG inhalation capsule Place 1 capsule (18 mcg total) into inhaler and inhale daily. 30 capsule 12   No current facility-administered medications for this visit.     Functional Status:  In your present state of health, do you have any difficulty performing the following activities: 02/16/2018 11/24/2017  Hearing? Y N  Vision? N N  Difficulty concentrating or making decisions? Y N  Walking or climbing stairs? Y N  Dressing or bathing? N N  Doing errands, shopping? Y N  Some recent data might be hidden    Fall/Depression Screening: No flowsheet data found. No flowsheet data found.  Assessment:   ASSESSMENT: Date Discharged from Hospital: 02/19/2018 Date Medication Reconciliation Performed: 03/07/2018  New Medications at Discharge:   Clindamycin   Probiotic  Fentanyl patch  Hydrocodone-acetaminophen  Patient was recently discharged from hospital and all  medications have been reviewed  Drugs sorted by system:  Neurologic/Psychologic: alprazolam, buspirone, venlafaxine, hydroxyzine   Cardiovascular: metoprolol, simvastatin  Pulmonary: Albuterol HFA  Pain: fentanyl, hydrocodone-acetaminophen, ibuprofen  Vitamins/Minerals: vitamin B12  Infectious Diseases: clindamycin  Miscellaneous: probiotic  Medications to avoid in the elderly:  - Alprazolam: This drug is identified in the [place]ers Criteria as a potentially inappropriate medication to be avoided in patients 65 years  and older due to increased risk of impaired cognition, delirium, falls, fractures, and motor vehicle accidents with benzodiazepine use.  -  Hydroxyzine: This drug is identified in the Beers Criteria as a potentially inappropriate medication to be avoided in patients 65 years and older due to anticholinergic side effects (confusion, urinary retention, blurred vision, constipation) - Combination of opioids and benzodiazepines: Combination recommended to be avoided due to increased CNS depression  Other Problems Identified:  - Patient is NOT taking ibandronate; [facility] does not have a prescription on file for this medication. Consider risk vs benefit of continued bisphosphonate treatment   PLAN: - Counseled patient's sister on indications and side effects of medications; identified which medications may contribute to confusion, CNS depression; Recommended that they continue to work with the patient's PCP regarding mental status and pulmonologist regarding infection - Will route note to patient's PCP for information   Catie Darnelle Maffucci, PharmD PGY2 Ambulatory Care Pharmacy Resident Phone: [phone]

## 2018-03-09 NOTE — Progress Notes (Signed)
[facility]     Assessment and Plan:  Lung mass with mediastinal lymphadenopathy, migratory infiltrates. Suspected organizing pneumonia, biopsies negative for malignancy. - Status post EBUS bronchoscopy with fine-needle aspiration of the right paratracheal and subcarinal lymph nodes with good returns which were negative.  - tried empiric course of steroids for possible Organizing Pneumonia (BOOP) but she had no improvement.    Intractable cough - Suspect  secondary to underlying lung mass.   - Prescribed narcotic cough medicine to be used at night to help with sleep. - Patient advised that she needs to get a primary care doctor.  Nicotine abuse - Again discussed the importance of smoke cessation, spent 3 minutes of discussion.  Does not appear that the patient is ready to quit at this time.  She continues to smoke   No orders of the defined types were placed in this encounter.  No orders of the defined types were placed in this encounter.   No follow-ups on file.   Date: 03/09/2018  MRN# [id number] Wendy Arias 11-20-1950   Wendy Arias is a 67 y.o. old female    Chief Complaint  Patient presents with  . Hospitalization Follow-up    pt c/o sob and cough  . Altered Mental Status    HPI:  The patient is a 67 yo female, she has been having cough with mass-like RLL infiltrates. She was originally was sent for CT-guided lung biopsy, however upon presentation for that it was noted that her infiltrates had improved, therefore this was canceled.  She then underwent bronchoscopy with EBUS which was unrevealing. The changes persisted and evolved/changed, she was tried empirically on a course of steroids for possible organizing pneumonia but she had no improvement. She was seen in the hospital for pneumonia and discharged with a 2 week course of abx.  And follows up at this time as a hospital follow-up. She continues to have the chest congestion and cough. She is  present with her sister who notes that she has been confused.  She tells me that she is not taking any medications, but her sister disagrees and notes that they are making sure that she is taking all of her medications.  The patient lives with her son. She is not drinking alcohol. Today she is A and O times 3 and does not appear confused.  She has stopped smoking.    **CT chest 02/16/2018 in comparison with previous on  12/20/2017; there is continued presence and possibly enlarging right lung opacity.  Continued subcarinal and right paratracheal lymphadenopathy.  Ace level 12/23/17; negative.   Social Hx:   Social History   Tobacco Use  . Smoking status: Current Some Day Smoker    Packs/day: 1.50    Years: 20.00    Pack years: 30.00    Types: Cigarettes  . Smokeless tobacco: Never Used  Substance Use Topics  . Alcohol use: Yes    Alcohol/week: 3.0 standard drinks    Types: 1 Glasses of wine, 2 Cans of beer per week    Comment: rarely  . Drug use: Never   Medication:    Current Outpatient Medications:  .  albuterol (PROVENTIL HFA;VENTOLIN HFA) 108 (90 Base) MCG/ACT inhaler, Inhale into the lungs every 6 (six) hours as needed for wheezing or shortness of breath., Disp: , Rfl:  .  ALPRAZolam (XANAX) 1 MG tablet, Take 1 mg by mouth 3 (three) times daily as needed for anxiety. , Disp: , Rfl: 2 .  busPIRone (BUSPAR) 10 MG tablet, Take 15 mg by mouth 2 (two) times daily. , Disp: , Rfl:  .  dicyclomine (BENTYL) 10 MG capsule, Take 10 mg by mouth every 6 (six) hours as needed for spasms., Disp: , Rfl:  .  fentaNYL (DURAGESIC - DOSED MCG/HR) 25 MCG/HR patch, Place 1 patch (25 mcg total) onto the skin every 3 (three) days., Disp: 5 patch, Rfl: 0 .  HYDROcodone-acetaminophen (NORCO/VICODIN) 5-325 MG tablet, Take 1-2 tablets by mouth every 6 (six) hours as needed for moderate pain or severe pain. (Patient not taking: Reported on 03/07/2018), Disp: 20 tablet, Rfl: 0 .  hydrOXYzine (ATARAX/VISTARIL)  25 MG tablet, Take 25-100 mg by mouth as needed (sleep). , Disp: , Rfl: 0 .  ibandronate (BONIVA) 150 MG tablet, Take 150 mg by mouth every 30 (thirty) days. Take in the morning with a full glass of water, on an empty stomach, and do not take anything else by mouth or lie down for the next 30 min., Disp: , Rfl:  .  ibuprofen (ADVIL,MOTRIN) 400 MG tablet, Take 1 tablet (400 mg total) by mouth every 6 (six) hours., Disp: 30 tablet, Rfl: 0 .  metoprolol succinate (TOPROL-XL) 50 MG 24 hr tablet, Take 50 mg by mouth daily. Take with or immediately following a meal., Disp: , Rfl:  .  simvastatin (ZOCOR) 20 MG tablet, Take 40 mg by mouth daily. , Disp: , Rfl:  .  tiotropium (SPIRIVA) 18 MCG inhalation capsule, Place 1 capsule (18 mcg total) into inhaler and inhale daily. (Patient not taking: Reported on 03/07/2018), Disp: 30 capsule, Rfl: 12 .  venlafaxine XR (EFFEXOR-XR) 75 MG 24 hr capsule, Take 75 mg by mouth daily with breakfast. , Disp: , Rfl:  .  vitamin B-12 (CYANOCOBALAMIN) 1000 MCG tablet, Take 1,000 mcg by mouth daily., Disp: , Rfl:    Allergies:  Penicillins      LABORATORY PANEL:   CBC No results for input(s): WBC, HGB, HCT, PLT in the last 168 hours. ------------------------------------------------------------------------------------------------------------------  Chemistries  No results for input(s): NA, K, CL, CO2, GLUCOSE, BUN, CREATININE, CALCIUM, MG, AST, ALT, ALKPHOS, BILITOT in the last 168 hours.  Invalid input(s): GFRCGP ------------------------------------------------------------------------------------------------------------------  Cardiac Enzymes No results for input(s): TROPONINI in the last 168 hours. ------------------------------------------------------------  RADIOLOGY:  No results found.     Thank  you for the consultation and for allowing [facility] to assist in the care of your patient. Our recommendations are noted above.   Please contact us if we can be of further service.   Marda Stalker, M.D., F.C.C.P.  Board Certified in Internal Medicine, Pulmonary Medicine, [place], and Sleep Medicine.  [facility] Office Number: [phone]   03/09/2018

## 2018-03-10 ENCOUNTER — Ambulatory Visit: Payer: PPO | Admitting: Internal Medicine

## 2018-03-10 ENCOUNTER — Other Ambulatory Visit
Admission: RE | Admit: 2018-03-10 | Discharge: 2018-03-10 | Disposition: A | Discharge status: Home / Self Care | Payer: PPO | Source: Ambulatory Visit | Attending: Internal Medicine | Admitting: Internal Medicine

## 2018-03-10 ENCOUNTER — Encounter: Payer: Self-pay | Admitting: Internal Medicine

## 2018-03-10 VITALS — BP 112/88 | HR 74 | Resp 16 | Ht 62.0 in | Wt 114.0 lb

## 2018-03-10 DIAGNOSIS — R918 Other nonspecific abnormal finding of lung field: Secondary | ICD-10-CM | POA: Diagnosis not present

## 2018-03-10 DIAGNOSIS — J8489 Other specified interstitial pulmonary diseases: Secondary | ICD-10-CM

## 2018-03-10 NOTE — Patient Instructions (Addendum)
Will send for lung biopsy and blood work.  Continue current medications.

## 2018-03-11 LAB — MISC LABCORP TEST (SEND OUT)
LABCORP TEST CODE: 252908
LABCORP TEST CODE: 52373

## 2018-03-11 LAB — RHEUMATOID FACTOR: RHEUMATOID FACTOR: 22.5 [IU]/mL — AB (ref 0.0–13.9)

## 2018-03-13 ENCOUNTER — Telehealth: Payer: Self-pay | Admitting: Internal Medicine

## 2018-03-13 NOTE — Telephone Encounter (Signed)
Gave patient's sister scheduling # [phone]. Informed if they didn't know prep they may be able to attach her # as contact for nurse to call. Nothing further needed.

## 2018-03-13 NOTE — Telephone Encounter (Signed)
Patients sister Loletha Carrow calling (on [place]) States she would like to know details on how patient should be prepping for CT biopsy scheduled on 9/25 Please call [name] to discuss at [phone]

## 2018-03-15 ENCOUNTER — Other Ambulatory Visit: Payer: Self-pay | Admitting: Internal Medicine

## 2018-03-15 ENCOUNTER — Telehealth: Payer: Self-pay | Admitting: Internal Medicine

## 2018-03-15 DIAGNOSIS — R0602 Shortness of breath: Secondary | ICD-10-CM | POA: Diagnosis not present

## 2018-03-15 DIAGNOSIS — J851 Abscess of lung with pneumonia: Secondary | ICD-10-CM | POA: Diagnosis not present

## 2018-03-15 DIAGNOSIS — R0789 Other chest pain: Secondary | ICD-10-CM | POA: Diagnosis not present

## 2018-03-15 DIAGNOSIS — J860 Pyothorax with fistula: Secondary | ICD-10-CM | POA: Diagnosis not present

## 2018-03-15 DIAGNOSIS — F411 Generalized anxiety disorder: Secondary | ICD-10-CM | POA: Diagnosis not present

## 2018-03-15 DIAGNOSIS — F332 Major depressive disorder, recurrent severe without psychotic features: Secondary | ICD-10-CM | POA: Diagnosis not present

## 2018-03-15 MED ORDER — HYDROCODONE-ACETAMINOPHEN 7.5-325 MG/15ML PO SOLN: 10.0000 mL | Freq: Every day | ORAL | 0 refills | Status: DC

## 2018-03-15 NOTE — Telephone Encounter (Signed)
Patient coughing brownish black sputum and is unable to sleep   Patient sister wants a call back to discuss abx and something for cough with codeine so the patient can sleep

## 2018-03-15 NOTE — Telephone Encounter (Signed)
Returned called to make patient's sister aware [facility].

## 2018-03-15 NOTE — Telephone Encounter (Signed)
Need to hold off on steroids or antibiotics until after the biopsy. Sent in a script for narcotic cough syrup to use at night for sleep.

## 2018-03-21 ENCOUNTER — Other Ambulatory Visit: Payer: Self-pay | Admitting: Student

## 2018-03-22 ENCOUNTER — Telehealth: Payer: Self-pay | Admitting: *Deleted

## 2018-03-22 ENCOUNTER — Other Ambulatory Visit: Payer: Self-pay | Admitting: Internal Medicine

## 2018-03-22 ENCOUNTER — Ambulatory Visit
Admission: RE | Admit: 2018-03-22 | Discharge: 2018-03-22 | Disposition: A | Discharge status: Home / Self Care | Payer: PPO | Source: Ambulatory Visit | Attending: Internal Medicine | Admitting: Internal Medicine

## 2018-03-22 ENCOUNTER — Telehealth: Payer: Self-pay | Admitting: Interventional Radiology

## 2018-03-22 DIAGNOSIS — R791 Abnormal coagulation profile: Secondary | ICD-10-CM

## 2018-03-22 DIAGNOSIS — R918 Other nonspecific abnormal finding of lung field: Secondary | ICD-10-CM | POA: Insufficient documentation

## 2018-03-22 LAB — CBC
HCT: 27.7 % — ABNORMAL LOW (ref 35.0–47.0)
HEMOGLOBIN: 8.9 g/dL — AB (ref 12.0–16.0)
MCH: 28.4 pg (ref 26.0–34.0)
MCHC: 32.2 g/dL (ref 32.0–36.0)
MCV: 88.4 fL (ref 80.0–100.0)
Platelets: 464 10*3/uL — ABNORMAL HIGH (ref 150–440)
RBC: 3.13 MIL/uL — AB (ref 3.80–5.20)
RDW: 18.5 % — ABNORMAL HIGH (ref 11.5–14.5)
WBC: 12.1 10*3/uL — ABNORMAL HIGH (ref 3.6–11.0)

## 2018-03-22 LAB — PROTIME-INR
INR: 1.75
INR: 1.7
PROTHROMBIN TIME: 20.3 s — AB (ref 11.4–15.2)
Prothrombin Time: 19.8 seconds — ABNORMAL HIGH (ref 11.4–15.2)

## 2018-03-22 LAB — APTT: aPTT: 47 seconds — ABNORMAL HIGH (ref 24–36)

## 2018-03-22 MED ORDER — MIDAZOLAM HCL 5 MG/5ML IJ SOLN
INTRAMUSCULAR | Status: AC
  Filled 2018-03-22: qty 5

## 2018-03-22 MED ORDER — VITAMIN K 100 MCG PO TABS: 100.0000 ug | ORAL_TABLET | Freq: Every day | ORAL | 0 refills | Status: DC

## 2018-03-22 MED ORDER — FENTANYL CITRATE (PF) 100 MCG/2ML IJ SOLN
INTRAMUSCULAR | Status: AC
  Filled 2018-03-22: qty 4

## 2018-03-22 MED ORDER — SODIUM CHLORIDE 0.9 % IV SOLN
INTRAVENOUS | Status: DC
  Administered 2018-03-22: 10:00:00 via INTRAVENOUS

## 2018-03-22 MED ORDER — HYDROCODONE-ACETAMINOPHEN 7.5-325 MG/15ML PO SOLN: 10.0000 mL | Freq: Every day | ORAL | 0 refills | Status: AC

## 2018-03-22 NOTE — Telephone Encounter (Signed)
Sent script for narcotic cough syrup.

## 2018-03-22 NOTE — Telephone Encounter (Signed)
Pts sister has been made aware of Rx for Vit K. She says patient will not be able to start until tomorrow due to pharmacy not having in stock. She is requesting another refill of cough syrup and Hydrocodone. Please advise?

## 2018-03-22 NOTE — Telephone Encounter (Signed)
[name] notified rx cough syrup sent pharmacy.

## 2018-03-22 NOTE — Telephone Encounter (Signed)
Patient presented today for attempted CT-guided biopsy of right middle lobe pulmonary mass however patient's INR was found to be elevated at 1.7, confirmed with repeat blood draw.  No biopsy attempted.  The etiology of the patient's elevated INR is indeterminate, specifically, the patient is not on any type of anticoagulation and has not started any new medications or supplements.  This was discussed with ordering pulmonologist, Dr. Ashby Dawes, who will see the patient in follow-up consultation.  [name], MD Pager #: [phone]

## 2018-03-22 NOTE — Telephone Encounter (Signed)
-----   Message from Laverle Hobby, MD sent at 03/22/2018 11:36 AM EDT ----- Regarding: Repeat INR Pt's IR lung biopsy was cancelled today due to elevated INR. I have called in a script to start vitamin K, she should start it today. I have ordered her to have repeat blood work on Monday, once her INR is normalized, we can reschedule the CT biopsy.

## 2018-03-22 NOTE — Telephone Encounter (Signed)
LMTCB x1.ss 

## 2018-03-27 ENCOUNTER — Telehealth: Payer: Self-pay | Admitting: Internal Medicine

## 2018-03-27 NOTE — Telephone Encounter (Signed)
Returned call to [facility] and advised that biopsy is his last option. Pt thinks it's just a infection and that she should be given another round off antibiotics before proceeding with biopsy. Please advise.

## 2018-03-27 NOTE — Telephone Encounter (Signed)
We have already given her multiple courses of antibiotics and prednisone without any significant improvement. At this time she needs to have a biopsy in order to determine what therapy might be effective. I have no further recommendations at this time. If she does not want the biopsy, she can follow up with [place] as needed.

## 2018-03-27 NOTE — Telephone Encounter (Signed)
Patient's sister advised. Pt wants to talk to her pcp in regards to biopsy. If she changes her mind then will call back to reschedule. Nothing further at this time.

## 2018-03-27 NOTE — Telephone Encounter (Signed)
Pt sis ter is calling, states that pt has decided not to r/s her lung biopsy. Please call to discuss.

## 2018-03-28 ENCOUNTER — Other Ambulatory Visit: Payer: Self-pay | Admitting: Internal Medicine

## 2018-03-28 DIAGNOSIS — Z1239 Encounter for other screening for malignant neoplasm of breast: Secondary | ICD-10-CM | POA: Diagnosis not present

## 2018-03-28 DIAGNOSIS — Z1231 Encounter for screening mammogram for malignant neoplasm of breast: Secondary | ICD-10-CM

## 2018-03-28 DIAGNOSIS — Z780 Asymptomatic menopausal state: Secondary | ICD-10-CM | POA: Diagnosis not present

## 2018-03-28 DIAGNOSIS — R05 Cough: Secondary | ICD-10-CM | POA: Diagnosis not present

## 2018-03-28 DIAGNOSIS — J851 Abscess of lung with pneumonia: Secondary | ICD-10-CM | POA: Diagnosis not present

## 2018-03-28 NOTE — Telephone Encounter (Signed)
Pt sister called, states pt would like to reschedule her lung biopsy. Please call.

## 2018-03-29 ENCOUNTER — Telehealth: Payer: Self-pay | Admitting: Internal Medicine

## 2018-03-29 DIAGNOSIS — Z780 Asymptomatic menopausal state: Secondary | ICD-10-CM | POA: Insufficient documentation

## 2018-03-29 DIAGNOSIS — R791 Abnormal coagulation profile: Secondary | ICD-10-CM

## 2018-03-29 NOTE — Telephone Encounter (Signed)
Called PCP office to discuss INR still 1.7. INR was 1.7 on 03/22/18. Dr. Ashby Dawes prescribed Vit K 100 mg daily. Pt has been on Vit K for 1 week. Please advise.

## 2018-03-29 NOTE — Telephone Encounter (Signed)
PCP is calling states patient's INR 1.7. Please call to discuss

## 2018-04-03 NOTE — Telephone Encounter (Signed)
Let's recheck the INR again.

## 2018-04-03 NOTE — Telephone Encounter (Signed)
Called patient sister and asked if they would have INR redrawn for review before procedure can be rescheduled. She will take her tomorrow. Nothing further needed.

## 2018-04-05 ENCOUNTER — Other Ambulatory Visit
Admission: RE | Admit: 2018-04-05 | Discharge: 2018-04-05 | Disposition: A | Discharge status: Home / Self Care | Payer: PPO | Source: Ambulatory Visit | Attending: Internal Medicine | Admitting: Internal Medicine

## 2018-04-05 DIAGNOSIS — R791 Abnormal coagulation profile: Secondary | ICD-10-CM | POA: Diagnosis not present

## 2018-04-05 LAB — BASIC METABOLIC PANEL
Anion gap: 10 (ref 5–15)
BUN: 12 mg/dL (ref 8–23)
CALCIUM: 7.8 mg/dL — AB (ref 8.9–10.3)
CO2: 27 mmol/L (ref 22–32)
CREATININE: 0.66 mg/dL (ref 0.44–1.00)
Chloride: 98 mmol/L (ref 98–111)
Glucose, Bld: 96 mg/dL (ref 70–99)
Potassium: 3.1 mmol/L — ABNORMAL LOW (ref 3.5–5.1)
SODIUM: 135 mmol/L (ref 135–145)

## 2018-04-05 LAB — PROTIME-INR
INR: 1.23
PROTHROMBIN TIME: 15.4 s — AB (ref 11.4–15.2)

## 2018-04-05 NOTE — Addendum Note (Signed)
Addended by: Santiago Bur on: 04/05/2018 08:45 AM   Modules accepted: Orders

## 2018-04-09 ENCOUNTER — Encounter: Payer: Self-pay | Admitting: Emergency Medicine

## 2018-04-09 ENCOUNTER — Inpatient Hospital Stay
Admission: EM | Admit: 2018-04-09 | Discharge: 2018-04-14 | DRG: 177 | Disposition: A | Discharge status: Other Acute Inpatient Hospital | Payer: PPO | Attending: Internal Medicine | Admitting: Internal Medicine

## 2018-04-09 ENCOUNTER — Emergency Department: Payer: PPO

## 2018-04-09 ENCOUNTER — Other Ambulatory Visit: Payer: Self-pay

## 2018-04-09 ENCOUNTER — Inpatient Hospital Stay: Payer: PPO

## 2018-04-09 DIAGNOSIS — J440 Chronic obstructive pulmonary disease with acute lower respiratory infection: Secondary | ICD-10-CM | POA: Diagnosis not present

## 2018-04-09 DIAGNOSIS — Z90710 Acquired absence of both cervix and uterus: Secondary | ICD-10-CM

## 2018-04-09 DIAGNOSIS — E785 Hyperlipidemia, unspecified: Secondary | ICD-10-CM | POA: Diagnosis present

## 2018-04-09 DIAGNOSIS — Z79899 Other long term (current) drug therapy: Secondary | ICD-10-CM | POA: Diagnosis not present

## 2018-04-09 DIAGNOSIS — D649 Anemia, unspecified: Secondary | ICD-10-CM | POA: Diagnosis not present

## 2018-04-09 DIAGNOSIS — Z8249 Family history of ischemic heart disease and other diseases of the circulatory system: Secondary | ICD-10-CM | POA: Diagnosis not present

## 2018-04-09 DIAGNOSIS — Z66 Do not resuscitate: Secondary | ICD-10-CM | POA: Diagnosis not present

## 2018-04-09 DIAGNOSIS — K7460 Unspecified cirrhosis of liver: Secondary | ICD-10-CM | POA: Diagnosis present

## 2018-04-09 DIAGNOSIS — R410 Disorientation, unspecified: Secondary | ICD-10-CM | POA: Diagnosis not present

## 2018-04-09 DIAGNOSIS — D638 Anemia in other chronic diseases classified elsewhere: Secondary | ICD-10-CM | POA: Diagnosis not present

## 2018-04-09 DIAGNOSIS — R899 Unspecified abnormal finding in specimens from other organs, systems and tissues: Secondary | ICD-10-CM

## 2018-04-09 DIAGNOSIS — R443 Hallucinations, unspecified: Secondary | ICD-10-CM | POA: Diagnosis not present

## 2018-04-09 DIAGNOSIS — J918 Pleural effusion in other conditions classified elsewhere: Secondary | ICD-10-CM | POA: Diagnosis not present

## 2018-04-09 DIAGNOSIS — R0789 Other chest pain: Secondary | ICD-10-CM | POA: Diagnosis not present

## 2018-04-09 DIAGNOSIS — I252 Old myocardial infarction: Secondary | ICD-10-CM | POA: Diagnosis not present

## 2018-04-09 DIAGNOSIS — R627 Adult failure to thrive: Secondary | ICD-10-CM | POA: Diagnosis present

## 2018-04-09 DIAGNOSIS — M7989 Other specified soft tissue disorders: Secondary | ICD-10-CM | POA: Diagnosis not present

## 2018-04-09 DIAGNOSIS — J860 Pyothorax with fistula: Secondary | ICD-10-CM | POA: Diagnosis present

## 2018-04-09 DIAGNOSIS — F17201 Nicotine dependence, unspecified, in remission: Secondary | ICD-10-CM | POA: Diagnosis not present

## 2018-04-09 DIAGNOSIS — J851 Abscess of lung with pneumonia: Principal | ICD-10-CM | POA: Diagnosis present

## 2018-04-09 DIAGNOSIS — G8929 Other chronic pain: Secondary | ICD-10-CM | POA: Diagnosis present

## 2018-04-09 DIAGNOSIS — Z9861 Coronary angioplasty status: Secondary | ICD-10-CM | POA: Diagnosis not present

## 2018-04-09 DIAGNOSIS — G2581 Restless legs syndrome: Secondary | ICD-10-CM | POA: Diagnosis present

## 2018-04-09 DIAGNOSIS — Z791 Long term (current) use of non-steroidal anti-inflammatories (NSAID): Secondary | ICD-10-CM

## 2018-04-09 DIAGNOSIS — J90 Pleural effusion, not elsewhere classified: Secondary | ICD-10-CM | POA: Diagnosis not present

## 2018-04-09 DIAGNOSIS — Z833 Family history of diabetes mellitus: Secondary | ICD-10-CM

## 2018-04-09 DIAGNOSIS — M8008XA Age-related osteoporosis with current pathological fracture, vertebra(e), initial encounter for fracture: Secondary | ICD-10-CM | POA: Diagnosis not present

## 2018-04-09 DIAGNOSIS — F419 Anxiety disorder, unspecified: Secondary | ICD-10-CM | POA: Diagnosis not present

## 2018-04-09 DIAGNOSIS — J189 Pneumonia, unspecified organism: Secondary | ICD-10-CM | POA: Diagnosis not present

## 2018-04-09 DIAGNOSIS — R1010 Upper abdominal pain, unspecified: Secondary | ICD-10-CM | POA: Diagnosis not present

## 2018-04-09 DIAGNOSIS — R0600 Dyspnea, unspecified: Secondary | ICD-10-CM | POA: Diagnosis not present

## 2018-04-09 DIAGNOSIS — Z8701 Personal history of pneumonia (recurrent): Secondary | ICD-10-CM | POA: Diagnosis not present

## 2018-04-09 DIAGNOSIS — K08409 Partial loss of teeth, unspecified cause, unspecified class: Secondary | ICD-10-CM | POA: Diagnosis not present

## 2018-04-09 DIAGNOSIS — M8008XD Age-related osteoporosis with current pathological fracture, vertebra(e), subsequent encounter for fracture with routine healing: Secondary | ICD-10-CM | POA: Diagnosis not present

## 2018-04-09 DIAGNOSIS — R1013 Epigastric pain: Secondary | ICD-10-CM | POA: Diagnosis not present

## 2018-04-09 DIAGNOSIS — J869 Pyothorax without fistula: Secondary | ICD-10-CM

## 2018-04-09 DIAGNOSIS — K769 Liver disease, unspecified: Secondary | ICD-10-CM | POA: Diagnosis not present

## 2018-04-09 DIAGNOSIS — F17210 Nicotine dependence, cigarettes, uncomplicated: Secondary | ICD-10-CM | POA: Diagnosis present

## 2018-04-09 DIAGNOSIS — R0602 Shortness of breath: Secondary | ICD-10-CM | POA: Diagnosis not present

## 2018-04-09 DIAGNOSIS — J449 Chronic obstructive pulmonary disease, unspecified: Secondary | ICD-10-CM | POA: Diagnosis not present

## 2018-04-09 DIAGNOSIS — M79661 Pain in right lower leg: Secondary | ICD-10-CM | POA: Diagnosis not present

## 2018-04-09 DIAGNOSIS — I10 Essential (primary) hypertension: Secondary | ICD-10-CM | POA: Diagnosis present

## 2018-04-09 DIAGNOSIS — G9341 Metabolic encephalopathy: Secondary | ICD-10-CM | POA: Diagnosis not present

## 2018-04-09 DIAGNOSIS — Z880 Allergy status to penicillin: Secondary | ICD-10-CM

## 2018-04-09 DIAGNOSIS — J439 Emphysema, unspecified: Secondary | ICD-10-CM | POA: Diagnosis not present

## 2018-04-09 LAB — CBC
HCT: 26.9 % — ABNORMAL LOW (ref 36.0–46.0)
Hemoglobin: 8.1 g/dL — ABNORMAL LOW (ref 12.0–15.0)
MCH: 27.9 pg (ref 26.0–34.0)
MCHC: 30.1 g/dL (ref 30.0–36.0)
MCV: 92.8 fL (ref 80.0–100.0)
NRBC: 0.3 % — AB (ref 0.0–0.2)
PLATELETS: 332 10*3/uL (ref 150–400)
RBC: 2.9 MIL/uL — ABNORMAL LOW (ref 3.87–5.11)
RDW: 20.4 % — AB (ref 11.5–15.5)
WBC: 12 10*3/uL — ABNORMAL HIGH (ref 4.0–10.5)

## 2018-04-09 LAB — BASIC METABOLIC PANEL
Anion gap: 11 (ref 5–15)
BUN: 13 mg/dL (ref 8–23)
CALCIUM: 7.9 mg/dL — AB (ref 8.9–10.3)
CO2: 25 mmol/L (ref 22–32)
CREATININE: 0.79 mg/dL (ref 0.44–1.00)
Chloride: 98 mmol/L (ref 98–111)
GFR calc non Af Amer: >60 mL/min (ref >60)
Glucose, Bld: 128 mg/dL — ABNORMAL HIGH (ref 70–99)
Potassium: 3.8 mmol/L (ref 3.5–5.1)
Sodium: 134 mmol/L — ABNORMAL LOW (ref 135–145)

## 2018-04-09 LAB — LACTIC ACID, PLASMA
Lactic Acid, Venous: 1.4 mmol/L (ref 0.5–1.9)
Lactic Acid, Venous: 1.8 mmol/L (ref 0.5–1.9)

## 2018-04-09 LAB — TROPONIN I: Troponin I: 0.05 ng/mL (ref <0.03)

## 2018-04-09 MED ORDER — GUAIFENESIN-CODEINE 100-10 MG/5ML PO SOLN
5.0000 mL | Freq: Four times a day (QID) | ORAL | Status: DC | PRN
  Administered 2018-04-09 – 2018-04-10 (×3): 5 mL via ORAL
  Filled 2018-04-09 (×3): qty 5

## 2018-04-09 MED ORDER — IOHEXOL 300 MG/ML  SOLN
75.0000 mL | Freq: Once | INTRAMUSCULAR | Status: AC | PRN
  Administered 2018-04-09: 75 mL via INTRAVENOUS

## 2018-04-09 MED ORDER — HEPARIN SODIUM (PORCINE) 5000 UNIT/ML IJ SOLN
5000.0000 [IU] | Freq: Three times a day (TID) | INTRAMUSCULAR | Status: DC
  Administered 2018-04-09 – 2018-04-11 (×5): 5000 [IU] via SUBCUTANEOUS
  Filled 2018-04-09 (×6): qty 1

## 2018-04-09 MED ORDER — PROMETHAZINE-CODEINE 6.25-10 MG/5ML PO SYRP
5.0000 mL | ORAL_SOLUTION | Freq: Four times a day (QID) | ORAL | Status: DC | PRN
  Filled 2018-04-09 (×2): qty 5

## 2018-04-09 MED ORDER — SIMVASTATIN 40 MG PO TABS
40.0000 mg | ORAL_TABLET | Freq: Every day | ORAL | Status: DC
  Administered 2018-04-09 – 2018-04-14 (×6): 40 mg via ORAL
  Filled 2018-04-09 (×3): qty 2
  Filled 2018-04-09 (×2): qty 1
  Filled 2018-04-09 (×2): qty 2

## 2018-04-09 MED ORDER — VITAMIN B-12 1000 MCG PO TABS
1000.0000 ug | ORAL_TABLET | Freq: Every day | ORAL | Status: DC
  Administered 2018-04-09 – 2018-04-14 (×6): 1000 ug via ORAL
  Filled 2018-04-09 (×6): qty 1

## 2018-04-09 MED ORDER — VENLAFAXINE HCL ER 75 MG PO CP24
75.0000 mg | ORAL_CAPSULE | Freq: Every day | ORAL | Status: DC
  Administered 2018-04-10 – 2018-04-12 (×3): 75 mg via ORAL
  Filled 2018-04-09 (×4): qty 1

## 2018-04-09 MED ORDER — TIOTROPIUM BROMIDE MONOHYDRATE 18 MCG IN CAPS
18.0000 ug | ORAL_CAPSULE | Freq: Every day | RESPIRATORY_TRACT | Status: DC
  Filled 2018-04-09: qty 5

## 2018-04-09 MED ORDER — BUSPIRONE HCL 15 MG PO TABS
15.0000 mg | ORAL_TABLET | Freq: Two times a day (BID) | ORAL | Status: DC
  Administered 2018-04-09 – 2018-04-12 (×5): 15 mg via ORAL
  Filled 2018-04-09: qty 2
  Filled 2018-04-09: qty 1
  Filled 2018-04-09: qty 2
  Filled 2018-04-09 (×2): qty 1
  Filled 2018-04-09 (×3): qty 2
  Filled 2018-04-09: qty 1
  Filled 2018-04-09: qty 2

## 2018-04-09 MED ORDER — IBANDRONATE SODIUM 150 MG PO TABS: 150.0000 mg | ORAL_TABLET | ORAL | Status: DC

## 2018-04-09 MED ORDER — SODIUM CHLORIDE 0.9 % IV SOLN
1.0000 g | Freq: Once | INTRAVENOUS | Status: AC
  Administered 2018-04-09: 1 g via INTRAVENOUS
  Filled 2018-04-09: qty 1

## 2018-04-09 MED ORDER — IPRATROPIUM-ALBUTEROL 0.5-2.5 (3) MG/3ML IN SOLN
3.0000 mL | RESPIRATORY_TRACT | Status: DC
  Administered 2018-04-09 – 2018-04-10 (×2): 3 mL via RESPIRATORY_TRACT
  Filled 2018-04-09 (×3): qty 3

## 2018-04-09 MED ORDER — HYDROCODONE-ACETAMINOPHEN 5-325 MG PO TABS
1.0000 | ORAL_TABLET | Freq: Four times a day (QID) | ORAL | Status: DC | PRN
  Administered 2018-04-09: 1 via ORAL
  Administered 2018-04-10: 2 via ORAL
  Administered 2018-04-10 (×2): 1 via ORAL
  Administered 2018-04-11 – 2018-04-13 (×7): 2 via ORAL
  Filled 2018-04-09: qty 2
  Filled 2018-04-09: qty 1
  Filled 2018-04-09 (×6): qty 2
  Filled 2018-04-09 (×2): qty 1
  Filled 2018-04-09: qty 2

## 2018-04-09 MED ORDER — METOPROLOL SUCCINATE ER 50 MG PO TB24
50.0000 mg | ORAL_TABLET | Freq: Every day | ORAL | Status: DC
  Administered 2018-04-09 – 2018-04-14 (×5): 50 mg via ORAL
  Filled 2018-04-09 (×5): qty 1

## 2018-04-09 MED ORDER — HYDROXYZINE HCL 25 MG PO TABS
25.0000 mg | ORAL_TABLET | ORAL | Status: DC | PRN
  Filled 2018-04-09: qty 4

## 2018-04-09 MED ORDER — DICYCLOMINE HCL 10 MG PO CAPS
10.0000 mg | ORAL_CAPSULE | Freq: Four times a day (QID) | ORAL | Status: DC | PRN
  Filled 2018-04-09: qty 1

## 2018-04-09 MED ORDER — VANCOMYCIN HCL IN DEXTROSE 750-5 MG/150ML-% IV SOLN
750.0000 mg | INTRAVENOUS | Status: DC
  Administered 2018-04-10: 750 mg via INTRAVENOUS
  Filled 2018-04-09: qty 150

## 2018-04-09 MED ORDER — SODIUM CHLORIDE 0.9 % IV SOLN
2.0000 g | Freq: Two times a day (BID) | INTRAVENOUS | Status: DC
  Administered 2018-04-10 – 2018-04-13 (×7): 2 g via INTRAVENOUS
  Filled 2018-04-09 (×9): qty 2

## 2018-04-09 MED ORDER — IBUPROFEN 400 MG PO TABS
400.0000 mg | ORAL_TABLET | Freq: Four times a day (QID) | ORAL | Status: DC
  Administered 2018-04-09 – 2018-04-11 (×6): 400 mg via ORAL
  Filled 2018-04-09 (×6): qty 1

## 2018-04-09 MED ORDER — DOCUSATE SODIUM 100 MG PO CAPS: 100.0000 mg | ORAL_CAPSULE | Freq: Two times a day (BID) | ORAL | Status: DC | PRN

## 2018-04-09 MED ORDER — VANCOMYCIN HCL IN DEXTROSE 1-5 GM/200ML-% IV SOLN
1000.0000 mg | Freq: Once | INTRAVENOUS | Status: AC
  Administered 2018-04-09: 1000 mg via INTRAVENOUS
  Filled 2018-04-09: qty 200

## 2018-04-09 MED ORDER — ALPRAZOLAM 0.5 MG PO TABS
1.0000 mg | ORAL_TABLET | Freq: Three times a day (TID) | ORAL | Status: DC | PRN
  Administered 2018-04-14: 1 mg via ORAL
  Filled 2018-04-09: qty 2

## 2018-04-09 NOTE — Consult Note (Signed)
Pharmacy Antibiotic Note  Wendy Arias is a 67 y.o. female admitted on 04/09/2018 with pneumonia.  Pharmacy has been consulted for vancomycin dosing. 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 She has no recent admissions at [place]: 1) Vancomycin 750mg  IV every 18 hours beginning 16 hours after 1st dose of 1000mg  in the ED.    Goal trough 15-20 mcg/mL.  K 0.041  Vd 36L  T1/2: 17h  Calculated concentrations at steady-state: 34.3/17.1 mcg/mL  Vt prior to 4th dose 2) Cefepime 2 grams IV every 12 hours  Height: 5\' 2"  (157.5 cm) Weight: 114 lb (51.7 kg) IBW/kg (Calculated) : 50.1  Temp (24hrs), Avg:98 F (36.7 C), Min:98 F (36.7 C), Max:98 F (36.7 C)  Recent Labs  Lab 04/05/18 0857 04/09/18 1446  WBC  --  12.0*  CREATININE 0.66 0.79    Estimated Creatinine Clearance: 53.2 mL/min (by C-G formula based on SCr of 0.79 mg/dL).    Allergies  Allergen Reactions  . Penicillins Other (See Comments)    Has patient had a PCN reaction causing immediate rash, facial/tongue/throat swelling, SOB or lightheadedness with hypotension: Yes Has patient had a PCN reaction causing severe rash involving mucus membranes or skin necrosis: Unknown Has patient had a PCN reaction that required hospitalization: Unknown Has patient had a PCN reaction occurring within the last 10 years: Unknown If all of the above answers are "NO", then may proceed with Cephalosporin use.    Antimicrobials this admission: vancomycin 10/13 >>  cefepime 10/13 >>   Microbiology results: 10/13 BCx: pending 10/14 MRSA PCR:   Thank you for allowing pharmacy to be a part of this patient's care.  Dallie Piles, PharmD 04/09/2018 4:26 PM

## 2018-04-09 NOTE — Progress Notes (Signed)

## 2018-04-09 NOTE — ED Provider Notes (Signed)
[facility] Department Provider Note   ____________________________________________    I have reviewed the triage vital signs and the nursing notes.   HISTORY  Chief Complaint Shortness of Breath     HPI Wendy Arias is a 67 y.o. 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  She is not sure if she has had fevers.  She describes the pain is significant and although she has chronic pain in her right chest it is far worse today.  No diaphoresis.  No nausea or vomiting.  Has lung biopsy scheduled this week.  Follows with Dr. Ashby Dawes of pulmonology   Past Medical History:  Diagnosis Date  . Anxiety   . Asthma   . Depression   . Hypertension   . Myocardial infarction ([place])   . Osteoporosis     Patient Active Problem List   Diagnosis Date Noted  . Abscess of middle lobe of right lung with pneumonia ([place]) 02/16/2018  . Mass of middle lobe of right lung 11/22/2017  . Mass of lower lobe of right lung 11/22/2017  . Mediastinal adenopathy 11/22/2017  . Acute encephalopathy 11/03/2017  . Benzodiazepine withdrawal ([place]) 11/03/2017  . H/O diarrhea 06/14/2017    Past Surgical History:  Procedure Laterality Date  . ABDOMINAL HYSTERECTOMY     partial  . CARDIAC CATHETERIZATION    . CARDIAC SURGERY    . COLONOSCOPY WITH PROPOFOL N/A 04/12/2017   Procedure: COLONOSCOPY WITH PROPOFOL;  Surgeon: Lollie Sails, MD;  Location: [facility] ENDOSCOPY;  Service: Endoscopy;  Laterality: N/A;  . CORONARY ANGIOPLASTY    . ENDOBRONCHIAL ULTRASOUND N/A 11/28/2017   Procedure: ENDOBRONCHIAL ULTRASOUND;  Surgeon: Laverle Hobby, MD;  Location: [facility] ORS;  Service: Pulmonary;  Laterality: N/A;  .  REDUCTION MAMMAPLASTY      Prior to Admission medications   Medication Sig Start Date End Date Taking? Authorizing Provider  albuterol (PROVENTIL HFA;VENTOLIN HFA) 108 (90 Base) MCG/ACT inhaler Inhale into the lungs every 6 (six) hours as needed for wheezing or shortness of breath.    [provider]  ALPRAZolam Duanne Moron) 1 MG tablet Take 1 mg by mouth 3 (three) times daily as needed for anxiety.  10/05/17   [provider]  busPIRone (BUSPAR) 10 MG tablet Take 15 mg by mouth 2 (two) times daily.     [provider]  dicyclomine (BENTYL) 10 MG capsule Take 10 mg by mouth every 6 (six) hours as needed for spasms. 11/01/17   [provider]  fentaNYL (DURAGESIC - DOSED MCG/HR) 25 MCG/HR patch Place 1 patch (25 mcg total) onto the skin every 3 (three) days. 02/21/18   Fritzi Mandes, MD  HYDROcodone-acetaminophen (NORCO/VICODIN) 5-325 MG tablet Take 1-2 tablets by mouth every 6 (six) hours as needed for moderate pain or severe pain. Patient not taking: Reported on 03/22/2018 03/03/18   Laverle Hobby, MD  hydrOXYzine (ATARAX/VISTARIL) 25 MG tablet Take 25-100 mg by mouth as needed (sleep).  11/10/17   [provider]  ibandronate (BONIVA) 150 MG tablet Take 150 mg by mouth every 30 (thirty) days. Take in the morning with a full glass of water, on an empty stomach, and do not take anything else by mouth or lie down for the  next 30 min.    [provider]  ibuprofen (ADVIL,MOTRIN) 400 MG tablet Take 1 tablet (400 mg total) by mouth every 6 (six) hours. 02/19/18   Fritzi Mandes, MD  metoprolol succinate (TOPROL-XL) 50 MG 24 hr tablet Take 50 mg by mouth daily. Take with or immediately following a meal.    [provider]  simvastatin (ZOCOR) 20 MG tablet Take 40 mg by mouth daily.     [provider]  tiotropium (SPIRIVA) 18 MCG inhalation capsule Place 1 capsule (18 mcg total) into inhaler and inhale daily. Patient not taking: Reported on  03/07/2018 11/22/17 02/16/18  Laverle Hobby, MD  venlafaxine XR (EFFEXOR-XR) 75 MG 24 hr capsule Take 75 mg by mouth daily with breakfast.     [provider]  vitamin B-12 (CYANOCOBALAMIN) 1000 MCG tablet Take 1,000 mcg by mouth daily.    [provider]  vitamin k 100 MCG tablet Take 1 tablet (100 mcg total) by mouth daily. 03/22/18   Laverle Hobby, MD     Allergies Penicillins  Family History  Problem Relation Age of Onset  . Breast cancer Mother 61  . Hypertension Mother   . Dementia Mother   . Breast cancer Maternal Aunt 70  . Breast cancer Other 35  . Hypertension Father   . Heart disease Father   . Diabetes Father   . Diabetes Sister     Social History Social History   Tobacco Use  . Smoking status: Current Some Day Smoker    Packs/day: 0.25    Years: 20.00    Pack years: 5.00    Types: Cigarettes  . Smokeless tobacco: Never Used  Substance Use Topics  . Alcohol use: Yes    Alcohol/week: 3.0 standard drinks    Types: 1 Glasses of wine, 2 Cans of beer per week    Comment: rarely  . Drug use: Never    Review of Systems  Constitutional: No fever/chills Eyes: No visual changes.  ENT: No sore throat. Cardiovascular: As above Respiratory: As above Gastrointestinal: No abdominal pain.  No nausea, no vomiting.   Genitourinary: Negative for dysuria. Musculoskeletal: Negative for back pain. Skin: Negative for rash. Neurological: Negative for headaches    ____________________________________________   PHYSICAL EXAM:  VITAL SIGNS: ED Triage Vitals [04/09/18 1439]  Enc Vitals Group     BP (!) 155/73     Pulse Rate 67     Resp (!) 26     Temp 98 F (36.7 C)     Temp Source Oral     SpO2 98 %     Weight 51.7 kg (114 lb)     Height 1.575 m (5\' 2" )     Head Circumference      Peak Flow      Pain Score 9     Pain Loc      Pain Edu?      Excl. in [place]?     Constitutional: Alert and oriented.  Eyes: Conjunctivae are  normal.   Nose: No congestion/rhinnorhea. Mouth/Throat: Mucous membranes are moist.    Cardiovascular: Normal rate, regular rhythm. Grossly normal heart sounds.  Good peripheral circulation. Respiratory: Normal respiratory effort.  Decreased breath sounds right lower lobe Gastrointestinal: Soft and nontender. No distention.  No CVA tenderness. Genitourinary: deferred Musculoskeletal: Mild right sided lower extremity edema warm and well perfused Neurologic:  Normal speech and language. No gross focal neurologic deficits are appreciated.  Skin:  Skin is warm, dry and intact. No  rash noted. Psychiatric: Mood and affect are normal. Speech and behavior are normal.  ____________________________________________   LABS (all labs ordered are listed, but only abnormal results are displayed)  Labs Reviewed  BASIC METABOLIC PANEL - Abnormal; Notable for the following components:      Result Value   Sodium 134 (*)    Glucose, Bld 128 (*)    Calcium 7.9 (*)    All other components within normal limits  CBC - Abnormal; Notable for the following components:   WBC 12.0 (*)    RBC 2.90 (*)    Hemoglobin 8.1 (*)    HCT 26.9 (*)    RDW 20.4 (*)    nRBC 0.3 (*)    All other components within normal limits  TROPONIN I - Abnormal; Notable for the following components:   Troponin I 0.05 (*)    All other components within normal limits  CULTURE, BLOOD (ROUTINE X 2)  CULTURE, BLOOD (ROUTINE X 2)  LACTIC ACID, PLASMA  LACTIC ACID, PLASMA   ____________________________________________  EKG  ED ECG REPORT I, Lavonia Drafts, the attending physician, personally viewed and interpreted this ECG.  Date: 04/09/2018  Rhythm: normal sinus rhythm QRS Axis: normal Intervals: normal ST/T Wave abnormalities: normal Narrative Interpretation: no evidence of acute ischemia  ____________________________________________  RADIOLOGY  Chest x-ray demonstrates worsened right middle lobe airspace  disease ____________________________________________   PROCEDURES  Procedure(s) performed: No  Procedures   Critical Care performed: No ____________________________________________   INITIAL IMPRESSION / ASSESSMENT AND PLAN / ED COURSE  Pertinent labs & imaging results that were available during my care of the patient were reviewed by me and considered in my medical decision making (see chart for details).  Patient presents with worsening chest pain, worsening shortness of breath of the last several days.  Lab work significant for mildly elevated white blood cell count, mildly elevated troponin likely related to strain of increased work of breathing.  Chest x-ray demonstrates worsening airspace disease.  We will start antibiotics and admit to the hospitalist service    ____________________________________________   FINAL CLINICAL IMPRESSION(S) / ED DIAGNOSES  Final diagnoses:  Abscess of right lung with pneumonia, unspecified part of lung ([place])  Other chest pain        Note:  This document was prepared using Dragon voice recognition software and may include unintentional dictation errors.    Lavonia Drafts, MD 04/09/18 [phone]

## 2018-04-09 NOTE — ED Triage Notes (Signed)
Here for [facility]. Has been chronic for couple years but worse over last week.  1 week ago patient started vitamin K daily because blood was too think for lung biopsy that is needed (not on blood thinners).  Pt now has RLE edema and pain since starting vitamin K.   Pain to right chest.  Unlabored at this time. Cough noted. Has not been able to be on abx because of need for biopsy.

## 2018-04-09 NOTE — H&P (Signed)
[facility] [place] [place] NAME: Wendy Arias    MR#:  [id number]  DATE OF BIRTH:  Feb 24, 1951  DATE OF ADMISSION:  04/09/2018  PRIMARY CARE PHYSICIAN: Glendon Axe, MD   REQUESTING/REFERRING PHYSICIAN: Corky Downs  CHIEF COMPLAINT:   Chief Complaint  Patient presents with  . Shortness of Breath    HISTORY OF PRESENT ILLNESS: Wendy Arias  is a 67 y.o. female with a known history of anxiety, asthma, depression, hypertension, myocardial infarction, osteoporosis-had been having pneumonia recurrent for last 1 year and required multiple doses and course of antibiotic treatments so far. 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  She follows with pulmonologist Dr. Juanell Fairly in clinic.  The plan was to do lung biopsy as outpatient, she was scheduled to have it done this week.  Meanwhile for last few days her cough and chest pain continue to get worse and she was getting short of breath with minimal exertion so decided to come back to the emergency room tonight. She is noted to have worsening in her pneumonia and possible complicated effusion along with that.  Advised to admit to medical services for further management.   PAST MEDICAL HISTORY:   Past Medical History:  Diagnosis Date  . Anxiety   . Asthma   . Depression   . Hypertension   . Myocardial infarction ([place])   . Osteoporosis     PAST SURGICAL HISTORY:  Past Surgical History:  Procedure Laterality Date  . ABDOMINAL HYSTERECTOMY     partial  . CARDIAC CATHETERIZATION    . CARDIAC SURGERY    . COLONOSCOPY WITH PROPOFOL N/A 04/12/2017   Procedure: COLONOSCOPY WITH PROPOFOL;  Surgeon: Lollie Sails, MD;  Location: [facility] ENDOSCOPY;  Service: Endoscopy;  Laterality: N/A;  . CORONARY ANGIOPLASTY    . ENDOBRONCHIAL ULTRASOUND N/A 11/28/2017   Procedure: ENDOBRONCHIAL ULTRASOUND;  Surgeon: Laverle Hobby, MD;  Location: [facility] ORS;  Service: Pulmonary;  Laterality:  N/A;  . REDUCTION MAMMAPLASTY      SOCIAL HISTORY:  Social History   Tobacco Use  . Smoking status: Current Some Day Smoker    Packs/day: 0.25    Years: 20.00    Pack years: 5.00    Types: Cigarettes  . Smokeless tobacco: Never Used  Substance Use Topics  . Alcohol use: Yes    Alcohol/week: 3.0 standard drinks    Types: 1 Glasses of wine, 2 Cans of beer per week    Comment: rarely    FAMILY HISTORY:  Family History  Problem Relation Age of Onset  . Breast cancer Mother 64  . Hypertension Mother   . Dementia Mother   . Breast cancer Maternal Aunt 70  . Breast cancer Other 35  . Hypertension Father   . Heart disease Father   . Diabetes Father   . Diabetes Sister     DRUG ALLERGIES:  Allergies  Allergen Reactions  . Penicillins Other (See Comments)    Has patient had a PCN reaction causing immediate rash, facial/tongue/throat swelling, SOB or lightheadedness with hypotension: Yes Has patient had a PCN reaction causing severe rash involving mucus membranes or skin necrosis: Unknown Has patient had a PCN reaction that required hospitalization: Unknown Has patient had a PCN reaction occurring within the last 10 years: Unknown If all of the above answers are "NO", then may proceed with Cephalosporin use.    REVIEW OF SYSTEMS:   CONSTITUTIONAL: No fever, fatigue or weakness.  EYES: No blurred or double vision.  EARS, NOSE, AND THROAT: No tinnitus or ear pain.  RESPIRATORY: She have cough, shortness of breath, no wheezing or hemoptysis.  CARDIOVASCULAR: She had a right-sided chest pain, no orthopnea, edema.  GASTROINTESTINAL: No nausea, vomiting, diarrhea or abdominal pain.  GENITOURINARY: No dysuria, hematuria.  ENDOCRINE: No polyuria, nocturia,  HEMATOLOGY: No anemia, easy bruising or bleeding SKIN: No rash or lesion. MUSCULOSKELETAL: No joint pain or arthritis.   NEUROLOGIC: No tingling, numbness, weakness.  PSYCHIATRY: No anxiety or depression.   MEDICATIONS  AT HOME:  Prior to Admission medications   Medication Sig Start Date End Date Taking? Authorizing Provider  albuterol (PROVENTIL HFA;VENTOLIN HFA) 108 (90 Base) MCG/ACT inhaler Inhale into the lungs every 6 (six) hours as needed for wheezing or shortness of breath.    [provider]  ALPRAZolam Duanne Moron) 1 MG tablet Take 1 mg by mouth 3 (three) times daily as needed for anxiety.  10/05/17   [provider]  busPIRone (BUSPAR) 10 MG tablet Take 15 mg by mouth 2 (two) times daily.     [provider]  dicyclomine (BENTYL) 10 MG capsule Take 10 mg by mouth every 6 (six) hours as needed for spasms. 11/01/17   [provider]  fentaNYL (DURAGESIC - DOSED MCG/HR) 25 MCG/HR patch Place 1 patch (25 mcg total) onto the skin every 3 (three) days. 02/21/18   Fritzi Mandes, MD  HYDROcodone-acetaminophen (NORCO/VICODIN) 5-325 MG tablet Take 1-2 tablets by mouth every 6 (six) hours as needed for moderate pain or severe pain. Patient not taking: Reported on 03/22/2018 03/03/18   Laverle Hobby, MD  hydrOXYzine (ATARAX/VISTARIL) 25 MG tablet Take 25-100 mg by mouth as needed (sleep).  11/10/17   [provider]  ibandronate (BONIVA) 150 MG tablet Take 150 mg by mouth every 30 (thirty) days. Take in the morning with a full glass of water, on an empty stomach, and do not take anything else by mouth or lie down for the next 30 min.    [provider]  ibuprofen (ADVIL,MOTRIN) 400 MG tablet Take 1 tablet (400 mg total) by mouth every 6 (six) hours. 02/19/18   Fritzi Mandes, MD  metoprolol succinate (TOPROL-XL) 50 MG 24 hr tablet Take 50 mg by mouth daily. Take with or immediately following a meal.    [provider]  simvastatin (ZOCOR) 20 MG tablet Take 40 mg by mouth daily.     [provider]  tiotropium (SPIRIVA) 18 MCG inhalation capsule Place 1 capsule (18 mcg total) into inhaler and inhale daily. Patient not taking: Reported on 03/07/2018 11/22/17  02/16/18  Laverle Hobby, MD  venlafaxine XR (EFFEXOR-XR) 75 MG 24 hr capsule Take 75 mg by mouth daily with breakfast.     [provider]  vitamin B-12 (CYANOCOBALAMIN) 1000 MCG tablet Take 1,000 mcg by mouth daily.    [provider]  vitamin k 100 MCG tablet Take 1 tablet (100 mcg total) by mouth daily. 03/22/18   Laverle Hobby, MD      PHYSICAL EXAMINATION:   VITAL SIGNS: Blood pressure (!) 155/73, pulse 67, temperature 98 F (36.7 C), temperature source Oral, resp. rate (!) 26, height 5\' 2"  (1.575 m), weight 51.7 kg, SpO2 98 %.  GENERAL:  67 y.o.-year-old patient lying in the bed with no acute distress.  EYES: Pupils equal, round, reactive to light and accommodation. No scleral icterus. Extraocular muscles intact.  HEENT: Head atraumatic, normocephalic. Oropharynx and nasopharynx clear.  NECK:  Supple, no jugular venous distention. No thyroid enlargement, no tenderness.  LUNGS: Normal breath sounds bilaterally, no wheezing, have right lower lobe crepitation. No use of accessory muscles of respiration.  CARDIOVASCULAR: S1, S2 normal. No murmurs, rubs, or gallops.  ABDOMEN: Soft, nontender, nondistended. Bowel sounds present. No organomegaly or mass.  EXTREMITIES: No pedal edema, cyanosis, or clubbing.  NEUROLOGIC: Cranial nerves II through XII are intact. Muscle strength 5/5 in all extremities. Sensation intact. Gait not checked.  PSYCHIATRIC: The patient is alert and oriented x 3.  SKIN: No obvious rash, lesion, or ulcer.   LABORATORY PANEL:   CBC Recent Labs  Lab 04/09/18 1446  WBC 12.0*  HGB 8.1*  HCT 26.9*  PLT 332  MCV 92.8  MCH 27.9  MCHC 30.1  RDW 20.4*   ------------------------------------------------------------------------------------------------------------------  Chemistries  Recent Labs  Lab 04/05/18 0857 04/09/18 1446  NA 135 134*  K 3.1* 3.8  CL 98 98  CO2 27 25  GLUCOSE 96 128*  BUN 12 13  CREATININE 0.66 0.79   CALCIUM 7.8* 7.9*   ------------------------------------------------------------------------------------------------------------------ estimated creatinine clearance is 53.2 mL/min (by C-G formula based on SCr of 0.79 mg/dL). ------------------------------------------------------------------------------------------------------------------ No results for input(s): TSH, T4TOTAL, T3FREE, THYROIDAB in the last 72 hours.  Invalid input(s): FREET3   Coagulation profile Recent Labs  Lab 04/05/18 0857  INR 1.23   ------------------------------------------------------------------------------------------------------------------- No results for input(s): DDIMER in the last 72 hours. -------------------------------------------------------------------------------------------------------------------  Cardiac Enzymes Recent Labs  Lab 04/09/18 1446  TROPONINI 0.05*   ------------------------------------------------------------------------------------------------------------------ Invalid input(s): POCBNP  ---------------------------------------------------------------------------------------------------------------  Urinalysis    Component Value Date/Time   COLORURINE YELLOW (A) 11/03/2017 0228   APPEARANCEUR CLEAR (A) 11/03/2017 0228   LABSPEC 1.017 11/03/2017 0228   PHURINE 5.0 11/03/2017 0228   GLUCOSEU NEGATIVE 11/03/2017 0228   HGBUR NEGATIVE 11/03/2017 0228   BILIRUBINUR NEGATIVE 11/03/2017 0228   KETONESUR NEGATIVE 11/03/2017 0228   PROTEINUR NEGATIVE 11/03/2017 0228   NITRITE NEGATIVE 11/03/2017 0228   LEUKOCYTESUR NEGATIVE 11/03/2017 0228     RADIOLOGY: Dg Chest 2 View  Result Date: 04/09/2018 CLINICAL DATA:  Shortness of breath.  Swelling and pain. EXAM: CHEST - 2 VIEW COMPARISON:  Plain film and CT of 02/16/2018 FINDINGS: Midline trachea. Hyperinflation. Borderline cardiomegaly. Atherosclerosis in the transverse aorta. Worsened right-sided aeration. Increase in right  pleural effusion with loculation laterally and likely posteriorly. Air-fluid level posteriorly on the lateral view suggests hydropneumothorax. A larger probable fluid level within the inferior right hemithorax. Worsened airspace disease, centered in the right middle lobe. Diffuse peribronchial thickening. IMPRESSION: Worsened right-sided aeration, likely due to progressive right middle lobe airspace disease and loculated pleural fluid. 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 Consider repeat contrast-enhanced CT. Aortic Atherosclerosis (ICD10-I70.0). Electronically Signed   By: Abigail Miyamoto M.D.   On: 04/09/2018 15:40   US Venous Img Lower Unilateral Right  Result Date: 04/09/2018 CLINICAL DATA:  Right leg pain and swelling for 2 days EXAM: RIGHT LOWER EXTREMITY VENOUS DOPPLER ULTRASOUND TECHNIQUE: Gray-scale sonography with graded compression, as well as color Doppler and duplex ultrasound were performed to evaluate the lower extremity deep venous systems from the level of the common femoral vein and including the common femoral, femoral, profunda femoral, popliteal and calf veins including the posterior tibial, peroneal and gastrocnemius veins when visible. The superficial great saphenous vein was also interrogated. Spectral Doppler was utilized to evaluate flow at rest and with distal augmentation maneuvers in the common femoral, femoral and popliteal  veins. COMPARISON:  None. FINDINGS: Contralateral Common Femoral Vein: Respiratory phasicity is normal and symmetric with the symptomatic side. No evidence of thrombus. Normal compressibility. Common Femoral Vein: No evidence of thrombus. Normal compressibility, respiratory phasicity and response to augmentation. Saphenofemoral Junction: No evidence of thrombus. Normal compressibility and flow on color Doppler imaging. Profunda Femoral Vein: No evidence of thrombus. Normal compressibility  and flow on color Doppler imaging. Femoral Vein: No evidence of thrombus. Normal compressibility, respiratory phasicity and response to augmentation. Popliteal Vein: No evidence of thrombus. Normal compressibility, respiratory phasicity and response to augmentation. Calf Veins: No evidence of thrombus. Normal compressibility and flow on color Doppler imaging. Superficial Great Saphenous Vein: No evidence of thrombus. Normal compressibility. Venous Reflux:  None. Other Findings:  Mild calf edema is noted. IMPRESSION: No evidence of deep venous thrombosis. Electronically Signed   By: Inez Catalina M.D.   On: 04/09/2018 15:24    EKG: Orders placed or performed during the hospital encounter of 04/09/18  . EKG 12-Lead  . EKG 12-Lead  . ED EKG within 10 minutes  . ED EKG within 10 minutes    IMPRESSION AND PLAN:  *Complicated pneumonia Parapneumonic effusion Recurrent pneumonia Bronchopleural fistula  Broad-spectrum antibiotics for now and get pulmonologist consult. The plan was to do lung biopsy as outpatient, further management as per pulmonologist inpatient.  *COPD We will give DuoNeb and Spiriva.  *Hypertension Continue metoprolol.  *Hyperlipidemia Continue simvastatin.     All the records are reviewed and case discussed with ED provider. Management plans discussed with the patient, family and they are in agreement.  CODE STATUS: DNR Code Status History    Date Active Date Inactive Code Status Order ID Comments User Context   02/16/2018 1842 02/19/2018 1751 DNR [id number]  Sela Hua, MD Inpatient   11/03/2017 0336 11/04/2017 1928 Full Code [id number]  Amelia Jo, MD Inpatient    Questions for Most Recent Historical Code Status (Order [id number])    Question Answer Comment   In the event of cardiac or respiratory ARREST Do not call a "code blue"    In the event of cardiac or respiratory ARREST Do not perform Intubation, CPR, defibrillation or ACLS    In the event of cardiac  or respiratory ARREST Use medication by any route, position, wound care, and other measures to relive pain and suffering. May use oxygen, suction and manual treatment of airway obstruction as needed for comfort.        TOTAL TIME TAKING CARE OF THIS PATIENT: 45 minutes.  Patient's sister was present in the room during my visit.  Vaughan Basta M.D on 04/09/2018   Between 7am to 6pm - Pager - [phone]  After 6pm go to [email or web address] - password [email or web address] [place] [facility]  Office  [phone]  CC: Primary care physician; Glendon Axe, MD   Note: This dictation was prepared with Dragon dictation along with smaller phrase technology. Any transcriptional errors that result from this process are unintentional.

## 2018-04-09 NOTE — Progress Notes (Signed)
Family Meeting Note  Advance Directive:yes  Today a meeting took place with the Patient and sister.   The following clinical team members were present during this meeting:MD  The following were discussed:Patient's diagnosis: Bronchopleural fistula, recurrent pneumonia, COPD, patient's progosis: Unable to determine and Goals for treatment: DNR  Additional follow-up to be provided: Pulmonologist  Time spent during discussion:20 minutes  Vaughan Basta, MD

## 2018-04-09 NOTE — Progress Notes (Signed)
   04/09/18 1852  Clinical Encounter Type  Visited With Patient;Family (Sister)  Visit Type Initial;Spiritual support (AD education)  Referral From Physician  Recommendations Follow-up as needed.  Spiritual Encounters  Spiritual Needs Emotional;Literature  Stress Factors  Patient Stress Factors Health changes   Chaplain completed AD education with the patient and her sister. Patient was uncomfortable and Chaplain was sensitive to her medical condition. The education component was brief and succinct. Chaplain offered to return for additional explanations and/or conversation as needed. OR has been closed.

## 2018-04-09 NOTE — Plan of Care (Signed)
  Problem: Education: Goal: Knowledge of General Education information will improve Description: Including pain rating scale, medication(s)/side effects and non-pharmacologic comfort measures Outcome: Progressing   Problem: Nutrition: Goal: Adequate nutrition will be maintained Outcome: Progressing   Problem: Coping: Goal: Level of anxiety will decrease Outcome: Progressing   

## 2018-04-10 ENCOUNTER — Other Ambulatory Visit: Payer: Self-pay

## 2018-04-10 DIAGNOSIS — R0600 Dyspnea, unspecified: Secondary | ICD-10-CM

## 2018-04-10 DIAGNOSIS — J869 Pyothorax without fistula: Secondary | ICD-10-CM

## 2018-04-10 LAB — BASIC METABOLIC PANEL
ANION GAP: 8 (ref 5–15)
BUN: 11 mg/dL (ref 8–23)
CO2: 26 mmol/L (ref 22–32)
Calcium: 7.7 mg/dL — ABNORMAL LOW (ref 8.9–10.3)
Chloride: 100 mmol/L (ref 98–111)
Creatinine, Ser: 0.71 mg/dL (ref 0.44–1.00)
GFR calc Af Amer: >60 mL/min (ref >60)
Glucose, Bld: 84 mg/dL (ref 70–99)
POTASSIUM: 3.7 mmol/L (ref 3.5–5.1)
SODIUM: 134 mmol/L — AB (ref 135–145)

## 2018-04-10 LAB — CBC
HCT: 25.4 % — ABNORMAL LOW (ref 36.0–46.0)
HEMOGLOBIN: 7.7 g/dL — AB (ref 12.0–15.0)
MCH: 27.6 pg (ref 26.0–34.0)
MCHC: 30.3 g/dL (ref 30.0–36.0)
MCV: 91 fL (ref 80.0–100.0)
NRBC: 0.2 % (ref 0.0–0.2)
Platelets: 299 10*3/uL (ref 150–400)
RBC: 2.79 MIL/uL — AB (ref 3.87–5.11)
RDW: 20.5 % — ABNORMAL HIGH (ref 11.5–15.5)
WBC: 13 10*3/uL — AB (ref 4.0–10.5)

## 2018-04-10 LAB — PROTIME-INR
INR: 1.26
PROTHROMBIN TIME: 15.7 s — AB (ref 11.4–15.2)

## 2018-04-10 LAB — MRSA PCR SCREENING: MRSA BY PCR: NEGATIVE

## 2018-04-10 LAB — APTT: aPTT: 38 seconds — ABNORMAL HIGH (ref 24–36)

## 2018-04-10 MED ORDER — IPRATROPIUM-ALBUTEROL 0.5-2.5 (3) MG/3ML IN SOLN
3.0000 mL | Freq: Four times a day (QID) | RESPIRATORY_TRACT | Status: DC
  Administered 2018-04-10 – 2018-04-11 (×3): 3 mL via RESPIRATORY_TRACT
  Filled 2018-04-10 (×4): qty 3

## 2018-04-10 MED ORDER — GUAIFENESIN-CODEINE 100-10 MG/5ML PO SOLN
5.0000 mL | Freq: Four times a day (QID) | ORAL | Status: DC
  Administered 2018-04-10 – 2018-04-14 (×15): 5 mL via ORAL
  Filled 2018-04-10 (×16): qty 5

## 2018-04-10 MED ORDER — DIPHENHYDRAMINE HCL 25 MG PO CAPS
25.0000 mg | ORAL_CAPSULE | Freq: Three times a day (TID) | ORAL | Status: DC | PRN
  Administered 2018-04-10 – 2018-04-13 (×3): 25 mg via ORAL
  Filled 2018-04-10 (×3): qty 1

## 2018-04-10 NOTE — Progress Notes (Signed)
Patient ID: Wendy Arias, female   DOB: 16-Nov-1950, 67 y.o.   MRN: [id number]  Chief Complaint  Patient presents with  . Shortness of Breath    Referred By Dr. Wray Kearns Reason for Referral right-sided empyema  HPI Location, Quality, Duration, Severity, Timing, Context, Modifying Factors, Associated Signs and Symptoms.  Wendy Arias is a 66 y.o. female.  She states that she is began having problems with pneumonia on her right side about 1 to 2 years ago.  She is been treated with multiple courses of antibiotics and even underwent bronchoscopy with endobronchial ultrasound-guided biopsy of some paratracheal nodes several months ago.  A definitive diagnosis was not established on that and she had been followed as an outpatient.  Over the last several days she states that she has been more short of breath with a worsening cough as well as some ear pain chest wall pain and peripheral edema.  The patient was admitted to the hospital and I was asked to see her after her chest x-ray is CT scan confirmed the presence of a multiloculated complex right pleural effusion.  The patient states that she has not coughed up any blood.  She has not had any fevers.  She states she quit smoking about a month ago and does not drink alcohol.  She states that she is been reasonably ambulatory at home.  She is independent.  She does complain of shortness of breath with minimal activities however.   Past Medical History:  Diagnosis Date  . Anxiety   . Asthma   . Depression   . Hypertension   . Myocardial infarction ([place])   . Osteoporosis     Past Surgical History:  Procedure Laterality Date  . ABDOMINAL HYSTERECTOMY     partial  . CARDIAC CATHETERIZATION    . CARDIAC SURGERY    . COLONOSCOPY WITH PROPOFOL N/A 04/12/2017   Procedure: COLONOSCOPY WITH PROPOFOL;  Surgeon: Lollie Sails, MD;  Location: [facility] ENDOSCOPY;  Service: Endoscopy;  Laterality: N/A;  . CORONARY ANGIOPLASTY    . ENDOBRONCHIAL  ULTRASOUND N/A 11/28/2017   Procedure: ENDOBRONCHIAL ULTRASOUND;  Surgeon: Laverle Hobby, MD;  Location: [facility] ORS;  Service: Pulmonary;  Laterality: N/A;  . REDUCTION MAMMAPLASTY      Family History  Problem Relation Age of Onset  . Breast cancer Mother 86  . Hypertension Mother   . Dementia Mother   . Breast cancer Maternal Aunt 70  . Breast cancer Other 35  . Hypertension Father   . Heart disease Father   . Diabetes Father   . Diabetes Sister     Social History Social History   Tobacco Use  . Smoking status: Current Some Day Smoker    Packs/day: 0.25    Years: 20.00    Pack years: 5.00    Types: Cigarettes  . Smokeless tobacco: Never Used  Substance Use Topics  . Alcohol use: Yes    Alcohol/week: 3.0 standard drinks    Types: 1 Glasses of wine, 2 Cans of beer per week    Comment: rarely  . Drug use: Never    Allergies  Allergen Reactions  . Penicillins Other (See Comments)    Has patient had a PCN reaction causing immediate rash, facial/tongue/throat swelling, SOB or lightheadedness with hypotension: Yes Has patient had a PCN reaction causing severe rash involving mucus membranes or skin necrosis: Unknown Has patient had a PCN reaction that required hospitalization: Unknown Has patient had a PCN reaction occurring within the  last 10 years: Unknown If all of the above answers are "NO", then may proceed with Cephalosporin use.    Current Facility-Administered Medications  Medication Dose Route Frequency Provider Last Rate Last Dose  . ALPRAZolam (XANAX) tablet 1 mg  1 mg Oral TID PRN Vaughan Basta, MD      . busPIRone (BUSPAR) tablet 15 mg  15 mg Oral BID Vaughan Basta, MD   15 mg at 04/10/18 0833  . ceFEPIme (MAXIPIME) 2 g in sodium chloride 0.9 % 100 mL IVPB  2 g Intravenous Q12H Vaughan Basta, MD   Stopped at 04/10/18 [id number]  . dicyclomine (BENTYL) capsule 10 mg  10 mg Oral Q6H PRN Vaughan Basta, MD      . diphenhydrAMINE  (BENADRYL) capsule 25 mg  25 mg Oral Q8H PRN Fritzi Mandes, MD   25 mg at 04/10/18 0912  . docusate sodium (COLACE) capsule 100 mg  100 mg Oral BID PRN Vaughan Basta, MD      . guaiFENesin-codeine 100-10 MG/5ML solution 5 mL  5 mL Oral Q6H Fritzi Mandes, MD      . heparin injection 5,000 Units  5,000 Units Subcutaneous Q8H Vaughan Basta, MD   5,000 Units at 04/10/18 1307  . HYDROcodone-acetaminophen (NORCO/VICODIN) 5-325 MG per tablet 1-2 tablet  1-2 tablet Oral Q6H PRN Vaughan Basta, MD   1 tablet at 04/10/18 0834  . hydrOXYzine (ATARAX/VISTARIL) tablet 25-100 mg  25-100 mg Oral PRN Vaughan Basta, MD      . ibuprofen (ADVIL,MOTRIN) tablet 400 mg  400 mg Oral Q6H Vaughan Basta, MD   400 mg at 04/10/18 1307  . ipratropium-albuterol (DUONEB) 0.5-2.5 (3) MG/3ML nebulizer solution 3 mL  3 mL Nebulization Q6H Fritzi Mandes, MD   3 mL at 04/10/18 1419  . metoprolol succinate (TOPROL-XL) 24 hr tablet 50 mg  50 mg Oral Daily Vaughan Basta, MD   50 mg at 04/09/18 2143  . simvastatin (ZOCOR) tablet 40 mg  40 mg Oral Daily Vaughan Basta, MD   40 mg at 04/10/18 0833  . [START ON 04/12/2018] tiotropium (SPIRIVA) inhalation capsule ([facility] use ONLY) 18 mcg  18 mcg Inhalation Daily Vaughan Basta, MD      . venlafaxine XR (EFFEXOR-XR) 24 hr capsule 75 mg  75 mg Oral Q breakfast Vaughan Basta, MD   75 mg at 04/10/18 0833  . vitamin B-12 (CYANOCOBALAMIN) tablet 1,000 mcg  1,000 mcg Oral Daily Vaughan Basta, MD   1,000 mcg at 04/10/18 9604      Review of Systems A complete review of systems was asked and was negative except for the following positive findings shortness of breath, cough, peripheral edema, chest wall pain, ear pain.  Blood pressure 140/72, pulse (!) 59, temperature 98.2 F (36.8 C), temperature source Oral, resp. rate 20, height 5\' 2"  (1.575 m), weight 51.7 kg, SpO2 99 %.  Physical Exam CONSTITUTIONAL:  Pleasant,  well-developed, well-nourished, and in no acute distress. EYES: Pupils equal and reactive to light, Sclera non-icteric EARS, NOSE, MOUTH AND THROAT:  The oropharynx was clear.  Dentition is good repair.  Oral mucosa pink and moist. LYMPH NODES:  Lymph nodes in the neck and axillae were normal RESPIRATORY:  Lungs were clear on the left with rales scattered throughout the right..  Normal respiratory effort without pathologic use of accessory muscles of respiration CARDIOVASCULAR: Heart was regular without murmurs.  There were no carotid bruits.  There was 2-3+ pitting edema GI: The abdomen was soft, nontender, and nondistended. There were no  palpable masses. There was no hepatosplenomegaly. There were normal bowel sounds in all quadrants. GU:  Rectal deferred.   MUSCULOSKELETAL:  Normal muscle strength and tone.  No clubbing or cyanosis.   SKIN:  There were no pathologic skin lesions.  There were no nodules on palpation. NEUROLOGIC:  Sensation is normal.  Cranial nerves are grossly intact. PSYCH:  Oriented to person, place and time.  Mood and affect are normal.  Data Reviewed CT scans and chest x-rays  I have personally reviewed the patient's imaging, laboratory findings and medical records.    Assessment    I have independently reviewed the patient's CT scan.  There is a complex right pleural effusion with multiple septated areas.  I do not believe that these are lung abscesses but they may be.  I do not see any obvious sign of lung cancer.  There are several lymph nodes that are enlarged in the mediastinum.  The liver is incompletely visualized and is concerning for cirrhosis.    Plan    I have reviewed her labs as well.  Her hematocrit is quite low and she requires an evaluation for that.  We will repeat her coagulation studies now.  I did discuss with her the possibility of performing a thoracotomy with decortication she understands it may be in the near future.  Once she has her anemia  evaluated and her coagulation studies evaluated we can then make further plans for surgery.     Nestor Lewandowsky, MD 04/10/2018, 4:37 PM

## 2018-04-10 NOTE — Consult Note (Signed)
[facility] Consultation      Assessment and Plan:  Loculated empyema. Mediastinal lymphadenopathy, status post EBUS bronchoscopy, negative for cancer. Liver disease with coagulopathy.  - Patient has had a long course of several months of progressive decline, multiple bilateral patchy infiltrates.  She is now presented with a complex highly loculated right-sided empyema.  Recommend a CT surgeon consultation for further work-up, possible decortication, as well as pleural biopsy for more definitive diagnosis. - Patient's INR corrected with vitamin K supplementation, however imaging is suggestive of liver disease, recommend further work-up.   Date: 04/10/2018  MRN# [id number] Wendy Arias September 01, 1950  Referring Physician: Dr. Posey Pronto for empyema.   Wendy Arias is a 67 y.o. old female seen in consultation for chief complaint of:    Chief Complaint  Patient presents with  . Shortness of Breath    HPI:  The patient is a 67 year old female, she was previously a Marine scientist at [employer], recently retired.  I have been following her in the outpatient setting for multiple right-sided pulmonary infiltrates/masses, she was also noted to have mediastinal lymphadenopathy.  She was initially seen in May 2019, with dyspnea on exertion, intractable cough.  She was noted to have right-sided lung mass with mediastinal lymphadenopathy, she underwent EBUS guided lymph node sampling, as well as fluoroscopic guided transbronchial biopsies from the right middle and right lower lobes.  These were unrevealing, did not show any evidence of cancer or infection.  She was then sent for a CT-guided lung biopsy, however upon presentation was noted that the most dominant mass appeared to have been shrinking, therefore that biopsy was cancelled by radiologist.  She was then tried empirically on steroids from 12/22/2017 for 4 weeks, however this showed no improvement.  Subsequently the patient presented to the hospital  in late August 2019 with pneumonia, lung abscess.  She was treated with 4 weeks of antibiotics.  During this time the patient continue to  lose a significant amount of weight, she had declining mental status, her family noted that she was confused, she had continued severe cough.  Repeat CT chest 02/16/18 continued to show right lung mass. She was scheduled for a repeat CT-guided lung biopsy last month, however upon presentation her INR was elevated at 1.7, that biopsy was then deferred.  She has a known history of alcohol abuse, though she is not currently drinking.  She was started on vitamin K, repeat INR was 1.23, biopsy was rescheduled.  The patient presented to the hospital on 04/09/2018 with aggressive shortness of breath over the last 1 week.  She noted pain on the right side of her chest as well.  She was sent for CT of the chest, imaging personally reviewed.  CT chest 63/06/6008 shows complicated highly loculated right-sided empyema, there is a right paratracheal lymphadenopathy, subcarinal lymphadenopathy. Over the last few days the patient presented to the hospital because of progressive dyspnea and severe coughing.    PMHX:   Past Medical History:  Diagnosis Date  . Anxiety   . Asthma   . Depression   . Hypertension   . Myocardial infarction ([place])   . Osteoporosis    Surgical Hx:  Past Surgical History:  Procedure Laterality Date  . ABDOMINAL HYSTERECTOMY     partial  . CARDIAC CATHETERIZATION    . CARDIAC SURGERY    . COLONOSCOPY WITH PROPOFOL N/A 04/12/2017   Procedure: COLONOSCOPY WITH PROPOFOL;  Surgeon: Lollie Sails, MD;  Location: [facility]COPY;  Service:  Endoscopy;  Laterality: N/A;  . CORONARY ANGIOPLASTY    . ENDOBRONCHIAL ULTRASOUND N/A 11/28/2017   Procedure: ENDOBRONCHIAL ULTRASOUND;  Surgeon: Laverle Hobby, MD;  Location: [facility] ORS;  Service: Pulmonary;  Laterality: N/A;  . REDUCTION MAMMAPLASTY     Family Hx:  Family History  Problem Relation  Age of Onset  . Breast cancer Mother 38  . Hypertension Mother   . Dementia Mother   . Breast cancer Maternal Aunt 70  . Breast cancer Other 35  . Hypertension Father   . Heart disease Father   . Diabetes Father   . Diabetes Sister    Social Hx:   Social History   Tobacco Use  . Smoking status: Current Some Day Smoker    Packs/day: 0.25    Years: 20.00    Pack years: 5.00    Types: Cigarettes  . Smokeless tobacco: Never Used  Substance Use Topics  . Alcohol use: Yes    Alcohol/week: 3.0 standard drinks    Types: 1 Glasses of wine, 2 Cans of beer per week    Comment: rarely  . Drug use: Never   Medication:    Current Facility-Administered Medications:  .  ALPRAZolam (XANAX) tablet 1 mg, 1 mg, Oral, TID PRN, Vaughan Basta, MD .  busPIRone (BUSPAR) tablet 15 mg, 15 mg, Oral, BID, Vaughan Basta, MD, 15 mg at 04/10/18 0833 .  ceFEPIme (MAXIPIME) 2 g in sodium chloride 0.9 % 100 mL IVPB, 2 g, Intravenous, Q12H, Vaughan Basta, MD, Last Rate: 200 mL/hr at 04/10/18 0544, 2 g at 04/10/18 0544 .  dicyclomine (BENTYL) capsule 10 mg, 10 mg, Oral, Q6H PRN, Vaughan Basta, MD .  diphenhydrAMINE (BENADRYL) capsule 25 mg, 25 mg, Oral, Q8H PRN, Fritzi Mandes, MD, 25 mg at 04/10/18 0912 .  docusate sodium (COLACE) capsule 100 mg, 100 mg, Oral, BID PRN, Vaughan Basta, MD .  guaiFENesin-codeine 100-10 MG/5ML solution 5 mL, 5 mL, Oral, Q6H PRN, Mayo, Pete Pelt, MD, 5 mL at 04/10/18 0425 .  heparin injection 5,000 Units, 5,000 Units, Subcutaneous, Q8H, Vaughan Basta, MD, 5,000 Units at 04/10/18 0538 .  HYDROcodone-acetaminophen (NORCO/VICODIN) 5-325 MG per tablet 1-2 tablet, 1-2 tablet, Oral, Q6H PRN, Vaughan Basta, MD, 1 tablet at 04/10/18 0834 .  hydrOXYzine (ATARAX/VISTARIL) tablet 25-100 mg, 25-100 mg, Oral, PRN, Vaughan Basta, MD .  ibuprofen (ADVIL,MOTRIN) tablet 400 mg, 400 mg, Oral, Q6H, Vaughan Basta, MD, 400 mg  at 04/10/18 0538 .  ipratropium-albuterol (DUONEB) 0.5-2.5 (3) MG/3ML nebulizer solution 3 mL, 3 mL, Nebulization, Q6H, Posey Pronto, Sona, MD .  metoprolol succinate (TOPROL-XL) 24 hr tablet 50 mg, 50 mg, Oral, Daily, Vaughan Basta, MD, 50 mg at 04/09/18 2143 .  simvastatin (ZOCOR) tablet 40 mg, 40 mg, Oral, Daily, Vaughan Basta, MD, 40 mg at 04/10/18 0833 .  [START ON 04/12/2018] tiotropium (SPIRIVA) inhalation capsule ([facility] use ONLY) 18 mcg, 18 mcg, Inhalation, Daily, Vaughan Basta, MD .  vancomycin (VANCOCIN) IVPB 750 mg/150 ml premix, 750 mg, Intravenous, Q18H, Vaughan Basta, MD, Last Rate: 150 mL/hr at 04/10/18 0840, 750 mg at 04/10/18 0840 .  venlafaxine XR (EFFEXOR-XR) 24 hr capsule 75 mg, 75 mg, Oral, Q breakfast, Vaughan Basta, MD, 75 mg at 04/10/18 0833 .  vitamin B-12 (CYANOCOBALAMIN) tablet 1,000 mcg, 1,000 mcg, Oral, Daily, Vaughan Basta, MD, 1,000 mcg at 04/09/18 2143   Allergies:  Penicillins  Review of Systems: Gen:  Denies  fever, sweats, chills HEENT: Denies blurred vision, double vision. bleeds, sore throat Cvc:  No dizziness, chest pain.  Resp:   Denies cough or sputum production, shortness of breath Gi: Denies swallowing difficulty, stomach pain. Gu:  Denies bladder incontinence, burning urine Ext:   No Joint pain, stiffness. Skin: No skin rash,  hives  Endoc:  No polyuria, polydipsia. Psych: No depression, insomnia. Other:  All other systems were reviewed with the patient and were negative other that what is mentioned in the HPI.   Physical Examination:   VS: BP (!) 146/81 (BP Location: Right Arm)   Pulse (!) 58   Temp 98.7 F (37.1 C) (Oral)   Resp 17   Ht 5\' 2"  (1.575 m)   Wt 51.7 kg   SpO2 98%   BMI 20.85 kg/m   General Appearance: No distress  Neuro:without focal findings,  speech normal,  HEENT: PERRLA, EOM intact.   Pulmonary: Decreased air entry right lung. CardiovascularNormal S1,S2.  No m/r/g.     Abdomen: Benign, Soft, non-tender. Renal:  No costovertebral tenderness  GU:  No performed at this time. Endoc: No evident thyromegaly, no signs of acromegaly. Skin:   warm, no rashes, no ecchymosis  Extremities: normal, no cyanosis, clubbing.  Other findings:    LABORATORY PANEL:   CBC Recent Labs  Lab 04/10/18 0253  WBC 13.0*  HGB 7.7*  HCT 25.4*  PLT 299   ------------------------------------------------------------------------------------------------------------------  Chemistries  Recent Labs  Lab 04/10/18 0253  NA 134*  K 3.7  CL 100  CO2 26  GLUCOSE 84  BUN 11  CREATININE 0.71  CALCIUM 7.7*   ------------------------------------------------------------------------------------------------------------------  Cardiac Enzymes Recent Labs  Lab 04/09/18 1446  TROPONINI 0.05*   ------------------------------------------------------------  RADIOLOGY:  Dg Chest 2 View  Result Date: 04/09/2018 CLINICAL DATA:  Shortness of breath.  Swelling and pain. EXAM: CHEST - 2 VIEW COMPARISON:  Plain film and CT of 02/16/2018 FINDINGS: Midline trachea. Hyperinflation. Borderline cardiomegaly. Atherosclerosis in the transverse aorta. Worsened right-sided aeration. Increase in right pleural effusion with loculation laterally and likely posteriorly. Air-fluid level posteriorly on the lateral view suggests hydropneumothorax. A larger probable fluid level within the inferior right hemithorax. Worsened airspace disease, centered in the right middle lobe. Diffuse peribronchial thickening. IMPRESSION: Worsened right-sided aeration, likely due to progressive right middle lobe airspace disease and loculated pleural fluid. 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 Consider repeat contrast-enhanced CT. Aortic Atherosclerosis (ICD10-I70.0). Electronically Signed   By: Abigail Miyamoto M.D.   On: 04/09/2018 15:40   Ct Chest  W Contrast  Result Date: 04/09/2018 CLINICAL DATA:  Worsening cough and chest pain. Status post multiple rounds of antibiotics. Pneumonia, unresolved. EXAM: CT CHEST WITH CONTRAST TECHNIQUE: Multidetector CT imaging of the chest was performed during intravenous contrast administration. CONTRAST:  67mL OMNIPAQUE IOHEXOL 300 MG/ML  SOLN COMPARISON:  Chest CT dated 02/16/2018. FINDINGS: Cardiovascular: Borderline cardiomegaly. No pericardial effusion. No thoracic aortic aneurysm. Scattered aortic atherosclerosis. Mediastinum/Nodes: Stable moderate-sized lymph nodes within the mediastinum, likely reactive. Esophagus is unremarkable. Trachea appears normal. Lungs/Pleura: Intervally developed empyema along the lateral pleural surface of the RIGHT lower lung, measuring at least 10 cm craniocaudal extent with AP measurement of approximately 7 cm and thickness of approximately 3 cm. Multiple additional empyemas along the posterior pleural surface of the RIGHT lower lung, multiloculated configuration, with measurements ranging from 2.5-5 cm greatest dimension, with intervening dense consolidations. LEFT lung is clear.  No pneumothorax. Upper Abdomen: No acute findings. Liver contours are slightly nodular suggesting cirrhosis. Musculoskeletal: Severe compression fracture deformity of the L1 vertebral body,  with at least 80% loss of anterior vertebral body height, stable compared to the earlier chest CT of 02/16/2018. No new osseous abnormality. IMPRESSION: 1. Multiple empyemas on the RIGHT. Dominant empyema along the lateral pleural surface of the RIGHT mid/lower lung, measuring at least 10 cm craniocaudal extent with approximately 7 cm AP dimension and approximately 3 cm thickness. 2. Complex/multiloculated empyemas along the posterior pleural surface of the RIGHT lower lung, with at least 4 separate collections identified with sizes ranging from 2.5 to 5 cm greatest dimension. Some component of pulmonary abscess is  possible, but the predominantly lentiform configurations suggest empyemas rather than abscess. Dense consolidations are interposed about the empyemas, presumed pneumonia. 3. LEFT lung is clear. 4. Suspected liver cirrhosis, incompletely imaged. 5. Severe compression fracture of the L1 vertebral body, stable compared to earlier CT of 02/16/2018. Aortic Atherosclerosis (ICD10-I70.0). Electronically Signed   By: Franki Cabot M.D.   On: 04/09/2018 18:03   US Venous Img Lower Unilateral Right  Result Date: 04/09/2018 CLINICAL DATA:  Right leg pain and swelling for 2 days EXAM: RIGHT LOWER EXTREMITY VENOUS DOPPLER ULTRASOUND TECHNIQUE: Gray-scale sonography with graded compression, as well as color Doppler and duplex ultrasound were performed to evaluate the lower extremity deep venous systems from the level of the common femoral vein and including the common femoral, femoral, profunda femoral, popliteal and calf veins including the posterior tibial, peroneal and gastrocnemius veins when visible. The superficial great saphenous vein was also interrogated. Spectral Doppler was utilized to evaluate flow at rest and with distal augmentation maneuvers in the common femoral, femoral and popliteal veins. COMPARISON:  None. FINDINGS: Contralateral Common Femoral Vein: Respiratory phasicity is normal and symmetric with the symptomatic side. No evidence of thrombus. Normal compressibility. Common Femoral Vein: No evidence of thrombus. Normal compressibility, respiratory phasicity and response to augmentation. Saphenofemoral Junction: No evidence of thrombus. Normal compressibility and flow on color Doppler imaging. Profunda Femoral Vein: No evidence of thrombus. Normal compressibility and flow on color Doppler imaging. Femoral Vein: No evidence of thrombus. Normal compressibility, respiratory phasicity and response to augmentation. Popliteal Vein: No evidence of thrombus. Normal compressibility, respiratory phasicity and  response to augmentation. Calf Veins: No evidence of thrombus. Normal compressibility and flow on color Doppler imaging. Superficial Great Saphenous Vein: No evidence of thrombus. Normal compressibility. Venous Reflux:  None. Other Findings:  Mild calf edema is noted. IMPRESSION: No evidence of deep venous thrombosis. Electronically Signed   By: Inez Catalina M.D.   On: 04/09/2018 15:24       Thank  you for the consultation and for allowing [facility] to assist in the care of your patient. Our recommendations are noted above.  Please contact us if we can be of further service.   Marda Stalker, M.D., F.C.C.P.  Board Certified in Internal Medicine, Pulmonary Medicine, [place], and Sleep Medicine.  [facility] Office Number: [phone]   04/10/2018

## 2018-04-10 NOTE — Progress Notes (Signed)
[place] [facility] [place] NAME: Wendy Arias    MR#:  [id number]  DATE OF BIRTH:  04/11/1951  SUBJECTIVE:   Patient came in with increasing shortness of breath and pleuritic chest pain. REVIEW OF SYSTEMS:   Review of Systems  Constitutional: Negative for chills, fever and weight loss.  HENT: Negative for ear discharge, ear pain and nosebleeds.   Eyes: Negative for blurred vision, pain and discharge.  Respiratory: Positive for shortness of breath. Negative for sputum production, wheezing and stridor.   Cardiovascular: Positive for chest pain. Negative for palpitations, orthopnea and PND.  Gastrointestinal: Negative for abdominal pain, diarrhea, nausea and vomiting.  Genitourinary: Negative for frequency and urgency.  Musculoskeletal: Negative for back pain and joint pain.  Neurological: Negative for sensory change, speech change, focal weakness and weakness.  Psychiatric/Behavioral: Negative for depression and hallucinations. The patient is not nervous/anxious.    Tolerating Diet:yes Tolerating PT: not needed  DRUG ALLERGIES:   Allergies  Allergen Reactions  . Penicillins Other (See Comments)    Has patient had a PCN reaction causing immediate rash, facial/tongue/throat swelling, SOB or lightheadedness with hypotension: Yes Has patient had a PCN reaction causing severe rash involving mucus membranes or skin necrosis: Unknown Has patient had a PCN reaction that required hospitalization: Unknown Has patient had a PCN reaction occurring within the last 10 years: Unknown If all of the above answers are "NO", then may proceed with Cephalosporin use.    VITALS:  Blood pressure (!) 146/81, pulse (!) 58, temperature 98.7 F (37.1 C), temperature source Oral, resp. rate 17, height 5\' 2"  (1.575 m), weight 51.7 kg, SpO2 98 %.  PHYSICAL EXAMINATION:   Physical Exam  GENERAL:  67 y.o.-year-old patient lying in the bed with no acute distress.   EYES: Pupils equal, round, reactive to light and accommodation. No scleral icterus. Extraocular muscles intact.  HEENT: Head atraumatic, normocephalic. Oropharynx and nasopharynx clear.  NECK:  Supple, no jugular venous distention. No thyroid enlargement, no tenderness.  LUNGS: decreased breath sounds right side,no wheezing, rales, rhonchi. No use of accessory muscles of respiration.  CARDIOVASCULAR: S1, S2 normal. No murmurs, rubs, or gallops.  ABDOMEN: Soft, nontender, nondistended. Bowel sounds present. No organomegaly or mass.  EXTREMITIES: No cyanosis, clubbing or edema b/l.    NEUROLOGIC: Cranial nerves II through XII are intact. No focal Motor or sensory deficits b/l.   PSYCHIATRIC:  patient is alert and oriented x 3.  SKIN: No obvious rash, lesion, or ulcer.   LABORATORY PANEL:  CBC Recent Labs  Lab 04/10/18 0253  WBC 13.0*  HGB 7.7*  HCT 25.4*  PLT 299    Chemistries  Recent Labs  Lab 04/10/18 0253  NA 134*  K 3.7  CL 100  CO2 26  GLUCOSE 84  BUN 11  CREATININE 0.71  CALCIUM 7.7*   Cardiac Enzymes Recent Labs  Lab 04/09/18 1446  TROPONINI 0.05*   RADIOLOGY:  Dg Chest 2 View  Result Date: 04/09/2018 CLINICAL DATA:  Shortness of breath.  Swelling and pain. EXAM: CHEST - 2 VIEW COMPARISON:  Plain film and CT of 02/16/2018 FINDINGS: Midline trachea. Hyperinflation. Borderline cardiomegaly. Atherosclerosis in the transverse aorta. Worsened right-sided aeration. Increase in right pleural effusion with loculation laterally and likely posteriorly. Air-fluid level posteriorly on the lateral view suggests hydropneumothorax. A larger probable fluid level within the inferior right hemithorax. Worsened airspace disease, centered in the right middle lobe. Diffuse peribronchial thickening. IMPRESSION: Worsened right-sided aeration, likely  due to progressive right middle lobe airspace disease and loculated pleural fluid. 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 Consider repeat contrast-enhanced CT. Aortic Atherosclerosis (ICD10-I70.0). Electronically Signed   By: Abigail Miyamoto M.D.   On: 04/09/2018 15:40   Ct Chest W Contrast  Result Date: 04/09/2018 CLINICAL DATA:  Worsening cough and chest pain. Status post multiple rounds of antibiotics. Pneumonia, unresolved. EXAM: CT CHEST WITH CONTRAST TECHNIQUE: Multidetector CT imaging of the chest was performed during intravenous contrast administration. CONTRAST:  62mL OMNIPAQUE IOHEXOL 300 MG/ML  SOLN COMPARISON:  Chest CT dated 02/16/2018. FINDINGS: Cardiovascular: Borderline cardiomegaly. No pericardial effusion. No thoracic aortic aneurysm. Scattered aortic atherosclerosis. Mediastinum/Nodes: Stable moderate-sized lymph nodes within the mediastinum, likely reactive. Esophagus is unremarkable. Trachea appears normal. Lungs/Pleura: Intervally developed empyema along the lateral pleural surface of the RIGHT lower lung, measuring at least 10 cm craniocaudal extent with AP measurement of approximately 7 cm and thickness of approximately 3 cm. Multiple additional empyemas along the posterior pleural surface of the RIGHT lower lung, multiloculated configuration, with measurements ranging from 2.5-5 cm greatest dimension, with intervening dense consolidations. LEFT lung is clear.  No pneumothorax. Upper Abdomen: No acute findings. Liver contours are slightly nodular suggesting cirrhosis. Musculoskeletal: Severe compression fracture deformity of the L1 vertebral body, with at least 80% loss of anterior vertebral body height, stable compared to the earlier chest CT of 02/16/2018. No new osseous abnormality. IMPRESSION: 1. Multiple empyemas on the RIGHT. Dominant empyema along the lateral pleural surface of the RIGHT mid/lower lung, measuring at least 10 cm craniocaudal extent with approximately 7 cm AP dimension and approximately 3 cm thickness. 2. Complex/multiloculated  empyemas along the posterior pleural surface of the RIGHT lower lung, with at least 4 separate collections identified with sizes ranging from 2.5 to 5 cm greatest dimension. Some component of pulmonary abscess is possible, but the predominantly lentiform configurations suggest empyemas rather than abscess. Dense consolidations are interposed about the empyemas, presumed pneumonia. 3. LEFT lung is clear. 4. Suspected liver cirrhosis, incompletely imaged. 5. Severe compression fracture of the L1 vertebral body, stable compared to earlier CT of 02/16/2018. Aortic Atherosclerosis (ICD10-I70.0). Electronically Signed   By: Franki Cabot M.D.   On: 04/09/2018 18:03   US Venous Img Lower Unilateral Right  Result Date: 04/09/2018 CLINICAL DATA:  Right leg pain and swelling for 2 days EXAM: RIGHT LOWER EXTREMITY VENOUS DOPPLER ULTRASOUND TECHNIQUE: Gray-scale sonography with graded compression, as well as color Doppler and duplex ultrasound were performed to evaluate the lower extremity deep venous systems from the level of the common femoral vein and including the common femoral, femoral, profunda femoral, popliteal and calf veins including the posterior tibial, peroneal and gastrocnemius veins when visible. The superficial great saphenous vein was also interrogated. Spectral Doppler was utilized to evaluate flow at rest and with distal augmentation maneuvers in the common femoral, femoral and popliteal veins. COMPARISON:  None. FINDINGS: Contralateral Common Femoral Vein: Respiratory phasicity is normal and symmetric with the symptomatic side. No evidence of thrombus. Normal compressibility. Common Femoral Vein: No evidence of thrombus. Normal compressibility, respiratory phasicity and response to augmentation. Saphenofemoral Junction: No evidence of thrombus. Normal compressibility and flow on color Doppler imaging. Profunda Femoral Vein: No evidence of thrombus. Normal compressibility and flow on color Doppler  imaging. Femoral Vein: No evidence of thrombus. Normal compressibility, respiratory phasicity and response to augmentation. Popliteal Vein: No evidence of thrombus. Normal compressibility, respiratory phasicity and response to augmentation.  Calf Veins: No evidence of thrombus. Normal compressibility and flow on color Doppler imaging. Superficial Great Saphenous Vein: No evidence of thrombus. Normal compressibility. Venous Reflux:  None. Other Findings:  Mild calf edema is noted. IMPRESSION: No evidence of deep venous thrombosis. Electronically Signed   By: Inez Catalina M.D.   On: 04/09/2018 15:24   ASSESSMENT AND PLAN:  Lameka Disla  is a 67 y.o. female with a known history of anxiety, asthma, depression, hypertension, myocardial infarction, osteoporosis-had been having pneumonia recurrent for last 1 year and required multiple doses and course of antibiotic treatments so far.She is noted to have worsening in her pneumonia and possible complicated effusion along with that.  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 D/c vancomycin - pulmonologist consult appreciated --recommends Dr Genevive Bi to see pt -D/w dr Genevive Bi  *COPD We will give DuoNeb and Spiriva.  *Hypertension Continue metoprolol.  *Hyperlipidemia Continue simvastatin.  *Elevated PT/INR affected from liver disorder. Changes of early cirrhosis noted on CT chest -patient does have history of alcohol use in the past. -Currently she denies drinking alcohol but will have social drinks  D/w pt and sister   Case discussed with Care Management/Social Worker. Management plans discussed with the patient, family and they are in agreement.  CODE STATUS: DNR  DVT Prophylaxis: lovenox  TOTAL TIME TAKING CARE OF THIS PATIENT: *30* minutes.  >50% time spent on counselling and coordination of  care  POSSIBLE D/C IN *fewDAYS, DEPENDING ON CLINICAL CONDITION.  Note: This dictation was prepared with Dragon dictation along with smaller phrase technology. Any transcriptional errors that result from this process are unintentional.  Fritzi Mandes M.D on 04/10/2018 at 2:11 PM  Between 7am to 6pm - Pager - [phone]  After 6pm go to [email or web address] - password [email or web address] [facility]  Office  [phone]  CC: Primary care physician; Glendon Axe, MDPatient ID: Ileana Roup, female   DOB: 1951/06/01, 67 y.o.   MRN: [id number]

## 2018-04-11 ENCOUNTER — Inpatient Hospital Stay: Payer: PPO

## 2018-04-11 ENCOUNTER — Other Ambulatory Visit: Payer: Self-pay | Admitting: Student

## 2018-04-11 DIAGNOSIS — D638 Anemia in other chronic diseases classified elsewhere: Secondary | ICD-10-CM

## 2018-04-11 DIAGNOSIS — K7460 Unspecified cirrhosis of liver: Secondary | ICD-10-CM

## 2018-04-11 DIAGNOSIS — I7143 Infrarenal abdominal aortic aneurysm, without rupture: Secondary | ICD-10-CM

## 2018-04-11 HISTORY — DX: Infrarenal abdominal aortic aneurysm, without rupture: I71.43

## 2018-04-11 LAB — IRON AND TIBC
Iron: 28 ug/dL (ref 28–170)
Saturation Ratios: 11 % (ref 10.4–31.8)
TIBC: 254 ug/dL (ref 250–450)
UIBC: 226 ug/dL

## 2018-04-11 LAB — OCCULT BLOOD X 1 CARD TO LAB, STOOL: Fecal Occult Bld: NEGATIVE

## 2018-04-11 LAB — HEMOGLOBIN: Hemoglobin: 7.7 g/dL — ABNORMAL LOW (ref 12.0–15.0)

## 2018-04-11 MED ORDER — ALBUTEROL SULFATE (2.5 MG/3ML) 0.083% IN NEBU
2.5000 mg | INHALATION_SOLUTION | Freq: Four times a day (QID) | RESPIRATORY_TRACT | Status: DC
  Administered 2018-04-11 – 2018-04-13 (×8): 2.5 mg via RESPIRATORY_TRACT
  Filled 2018-04-11 (×8): qty 3

## 2018-04-11 MED ORDER — IOHEXOL 300 MG/ML  SOLN
75.0000 mL | Freq: Once | INTRAMUSCULAR | Status: AC | PRN
  Administered 2018-04-11: 75 mL via INTRAVENOUS

## 2018-04-11 MED ORDER — TIOTROPIUM BROMIDE MONOHYDRATE 18 MCG IN CAPS
18.0000 ug | ORAL_CAPSULE | Freq: Every day | RESPIRATORY_TRACT | Status: DC
  Administered 2018-04-11 – 2018-04-14 (×4): 18 ug via RESPIRATORY_TRACT
  Filled 2018-04-11 (×2): qty 5

## 2018-04-11 MED ORDER — IOPAMIDOL (ISOVUE-300) INJECTION 61%
15.0000 mL | INTRAVENOUS | Status: AC
  Administered 2018-04-11 (×2): 15 mL via ORAL

## 2018-04-11 MED ORDER — ALBUTEROL SULFATE HFA 108 (90 BASE) MCG/ACT IN AERS: 1.0000 | INHALATION_SPRAY | Freq: Four times a day (QID) | RESPIRATORY_TRACT | Status: DC

## 2018-04-11 MED ORDER — SODIUM CHLORIDE 0.9 % IV SOLN
INTRAVENOUS | Status: DC
  Administered 2018-04-12: 06:00:00 via INTRAVENOUS

## 2018-04-11 NOTE — Progress Notes (Signed)
  Patient ID: Wendy Arias, female   DOB: August 23, 1950, 66 y.o.   MRN: [id number]  HISTORY: She states that she is about the same today.  She is noticed no significant improvement in her cough or shortness of breath.  She does state that her pain is a little better with the medication she has been receiving.  She denied any fevers.   Vitals:   04/10/18 2339 04/11/18 0752  BP: 115/78 (!) 162/89  Pulse: (!) 55 61  Resp:  20  Temp:  97.7 F (36.5 C)  SpO2:  99%     EXAM:    Resp: Lungs are clear on the left but diminished at the right base.  No respiratory distress, normal effort. Heart:  Regular without murmurs Abd:  Abdomen is soft, non distended and non tender. No masses are palpable.  There is no rebound and no guarding.  Neurological: Alert and oriented to person, place, and time. Coordination normal.  Skin: Skin is warm and dry. No rash noted. No diaphoretic. No erythema. No pallor.  Psychiatric: Normal mood and affect. Normal behavior. Judgment and thought content normal.    ASSESSMENT: She has a complex right pleural effusion with associated pneumonia of the right lung.  Her white count is minimally elevated and she has had no fever.  This complex fluid collection may be related to an empyema or may be parapneumonic only.  Repeat hemoglobin is 7.7 and over the last several months has been trending down.   PLAN:   I had a long discussion with her today.  I reviewed all of her films.  The right pleural space will require decortication to drain the fluid however this does not address her underlying immunocompetent C and her recurrent pneumonias.  Also it does not address the dense pneumonia she has in her right middle lower lobes at this time.  Her prothrombin time is slightly elevated as well and this may be on the basis of underlying liver disease.  This should be further investigated.  We will continue to follow with you.    Nestor Lewandowsky, MDPatient ID: Wendy Arias, female    DOB: 05-Jan-1951, 67 y.o.   MRN: [id number]

## 2018-04-11 NOTE — Progress Notes (Signed)
I spoke with care nurse Georgina Snell RN regarding CT drain for 04/12/18, to hold evening heparin dose, NPO after MN, and coags to be drawn 10/16,questions answered.

## 2018-04-11 NOTE — Progress Notes (Signed)
[place] [facility] [place] NAME: Wendy Arias    MR#:  [id number]  DATE OF BIRTH:  1951/01/29  SUBJECTIVE:   Patient came in with increasing shortness of breath and pleuritic chest pain. REVIEW OF SYSTEMS:   Review of Systems  Constitutional: Negative for chills, fever and weight loss.  HENT: Negative for ear discharge, ear pain and nosebleeds.   Eyes: Negative for blurred vision, pain and discharge.  Respiratory: Positive for shortness of breath. Negative for sputum production, wheezing and stridor.   Cardiovascular: Positive for chest pain. Negative for palpitations, orthopnea and PND.  Gastrointestinal: Negative for abdominal pain, diarrhea, nausea and vomiting.  Genitourinary: Negative for frequency and urgency.  Musculoskeletal: Negative for back pain and joint pain.  Neurological: Negative for sensory change, speech change, focal weakness and weakness.  Psychiatric/Behavioral: Negative for depression and hallucinations. The patient is not nervous/anxious.    Tolerating Diet:yes Tolerating PT: not needed  DRUG ALLERGIES:   Allergies  Allergen Reactions  . Penicillins Other (See Comments)    Has patient had a PCN reaction causing immediate rash, facial/tongue/throat swelling, SOB or lightheadedness with hypotension: Yes Has patient had a PCN reaction causing severe rash involving mucus membranes or skin necrosis: Unknown Has patient had a PCN reaction that required hospitalization: Unknown Has patient had a PCN reaction occurring within the last 10 years: Unknown If all of the above answers are "NO", then may proceed with Cephalosporin use.    VITALS:  Blood pressure (!) 162/89, pulse 61, temperature 97.7 F (36.5 C), temperature source Oral, resp. rate 20, height 5\' 2"  (1.575 m), weight 51.7 kg, SpO2 99 %.  PHYSICAL EXAMINATION:   Physical Exam  GENERAL:  67 y.o.-year-old patient lying in the bed with no acute distress.   EYES: Pupils equal, round, reactive to light and accommodation. No scleral icterus. Extraocular muscles intact.  HEENT: Head atraumatic, normocephalic. Oropharynx and nasopharynx clear.  NECK:  Supple, no jugular venous distention. No thyroid enlargement, no tenderness.  LUNGS: decreased breath sounds right side,no wheezing, rales, rhonchi. No use of accessory muscles of respiration.  CARDIOVASCULAR: S1, S2 normal. No murmurs, rubs, or gallops.  ABDOMEN: Soft, nontender, nondistended. Bowel sounds present. No organomegaly or mass.  EXTREMITIES: No cyanosis, clubbing or edema b/l.    NEUROLOGIC: Cranial nerves II through XII are intact. No focal Motor or sensory deficits b/l.   PSYCHIATRIC:  patient is alert and oriented x 3.  SKIN: No obvious rash, lesion, or ulcer.   LABORATORY PANEL:  CBC Recent Labs  Lab 04/10/18 0253 04/11/18 0749  WBC 13.0*  --   HGB 7.7* 7.7*  HCT 25.4*  --   PLT 299  --     Chemistries  Recent Labs  Lab 04/10/18 0253  NA 134*  K 3.7  CL 100  CO2 26  GLUCOSE 84  BUN 11  CREATININE 0.71  CALCIUM 7.7*   Cardiac Enzymes Recent Labs  Lab 04/09/18 1446  TROPONINI 0.05*   RADIOLOGY:  Ct Chest W Contrast  Result Date: 04/09/2018 CLINICAL DATA:  Worsening cough and chest pain. Status post multiple rounds of antibiotics. Pneumonia, unresolved. EXAM: CT CHEST WITH CONTRAST TECHNIQUE: Multidetector CT imaging of the chest was performed during intravenous contrast administration. CONTRAST:  69mL OMNIPAQUE IOHEXOL 300 MG/ML  SOLN COMPARISON:  Chest CT dated 02/16/2018. FINDINGS: Cardiovascular: Borderline cardiomegaly. No pericardial effusion. No thoracic aortic aneurysm. Scattered aortic atherosclerosis. Mediastinum/Nodes: Stable moderate-sized lymph nodes within the mediastinum, likely reactive.  Esophagus is unremarkable. Trachea appears normal. Lungs/Pleura: Intervally developed empyema along the lateral pleural surface of the RIGHT lower lung, measuring  at least 10 cm craniocaudal extent with AP measurement of approximately 7 cm and thickness of approximately 3 cm. Multiple additional empyemas along the posterior pleural surface of the RIGHT lower lung, multiloculated configuration, with measurements ranging from 2.5-5 cm greatest dimension, with intervening dense consolidations. LEFT lung is clear.  No pneumothorax. Upper Abdomen: No acute findings. Liver contours are slightly nodular suggesting cirrhosis. Musculoskeletal: Severe compression fracture deformity of the L1 vertebral body, with at least 80% loss of anterior vertebral body height, stable compared to the earlier chest CT of 02/16/2018. No new osseous abnormality. IMPRESSION: 1. Multiple empyemas on the RIGHT. Dominant empyema along the lateral pleural surface of the RIGHT mid/lower lung, measuring at least 10 cm craniocaudal extent with approximately 7 cm AP dimension and approximately 3 cm thickness. 2. Complex/multiloculated empyemas along the posterior pleural surface of the RIGHT lower lung, with at least 4 separate collections identified with sizes ranging from 2.5 to 5 cm greatest dimension. Some component of pulmonary abscess is possible, but the predominantly lentiform configurations suggest empyemas rather than abscess. Dense consolidations are interposed about the empyemas, presumed pneumonia. 3. LEFT lung is clear. 4. Suspected liver cirrhosis, incompletely imaged. 5. Severe compression fracture of the L1 vertebral body, stable compared to earlier CT of 02/16/2018. Aortic Atherosclerosis (ICD10-I70.0). Electronically Signed   By: Franki Cabot M.D.   On: 04/09/2018 18:03   Ct Abdomen Pelvis W Contrast  Result Date: 04/11/2018 CLINICAL DATA:  67 year old female with upper abdominal pain off and on for the past year. Prior hysterectomy. Subsequent encounter. EXAM: CT ABDOMEN AND PELVIS WITH CONTRAST TECHNIQUE: Multidetector CT imaging of the abdomen and pelvis was performed using the  standard protocol following bolus administration of intravenous contrast. CONTRAST:  78mL OMNIPAQUE IOHEXOL 300 MG/ML  SOLN COMPARISON:  04/09/2018 chest CT. 11/22/2017 head CT. 11/14/2017 CT abdomen and pelvis. FINDINGS: Lower chest: Multiple right lung empyemas as noted on recent chest CT. Heart slightly enlarged. Coronary artery calcification. Mediastinal shift to the right. Hepatobiliary: Enlarged liver spanning over 21 cm. Slightly lobular contour raising possibility of cirrhosis. Left lobe liver 3.4 cm cyst as previously noted. No worrisome hepatic lesion identified. Contracted gallbladder with mild wall thickening which may be related to contracted state. Limited for evaluating for cholecystitis given the third spacing of fluid. No calcified gallstone. Pancreas: No worrisome pancreatic mass or inflammation. Spleen: No splenic mass or enlargement. Adrenals/Urinary Tract: No obstructing stone or hydronephrosis. No worrisome renal, adrenal or urinary bladder mass. Stomach/Bowel: Prominent stool right colon and proximal transverse colon. Evaluation for bowel inflammatory process slightly limited by mild third spacing of fluid. No discrete extraluminal bowel inflammatory process noted. Appendix not visualized, possibly removed at time of hysterectomy. Distal esophagus immediately adjacent empyema. Vascular/Lymphatic: Atherosclerotic changes aorta and aortic branch vessels. Infrarenal ectasia with aorta measuring up to 2.5 cm maximal transverse dimension. Prominent narrowing iliac arteries. Prominent narrowing origin inferior mesenteric artery. Scattered normal size lymph nodes. Reproductive: Post hysterectomy.  No worrisome adnexal mass. Other: Third spacing of fluid.  No free intraperitoneal air. Musculoskeletal: Chronic L1 anterior wedge compression deformity with 80% loss of height anteriorly and 60% loss of height posteriorly with retropulsion posterior inferior aspect narrowing the ventral canal approaching  the conus. Kyphosis centered at this level similar to 02/16/2018 chest CT. IMPRESSION: 1. Multiple right lung empyemas as noted on recent chest CT. 2. Third spacing of  fluid. 3. Prominent stool right colon and proximal transverse colon. Evaluation for bowel inflammatory process slightly limited by mild third spacing of fluid. No discrete extraluminal bowel inflammatory process noted. Appendix not visualized, possibly removed at time of hysterectomy. 4. Enlarged liver spanning over 21 cm. Slightly lobular contour raising possibility of cirrhosis. Left lobe liver 3.4 cm cyst as previously noted. No worrisome hepatic lesion identified. 5. Contracted gallbladder with mild wall thickening which may be related to contracted state. Limited for evaluating for cholecystitis given the third spacing of fluid. 6. Aortic Atherosclerosis (ICD10-I70.0). Atherosclerotic changes aorta and aortic branch vessels. Infrarenal ectasia with aorta measuring up to 2.5 cm maximal transverse dimension. Ectatic abdominal aorta at risk for aneurysm development. Recommend followup by ultrasound in 5 years. This recommendation follows ACR consensus guidelines: White Paper of the ACR Incidental Findings Committee II on Vascular Findings. J Am Coll Radiol 2013; 10:789-794. 7. Prominent narrowing iliac arteries. Prominent narrowing origin inferior mesenteric artery. 8. Mild cardiomegaly.  Coronary artery calcifications. 9. Chronic L1 anterior wedge compression fracture with retropulsion as detailed above. Electronically Signed   By: Genia Del M.D.   On: 04/11/2018 13:33   ASSESSMENT AND PLAN:  Wendy Arias  is a 67 y.o. female with a known history of anxiety, asthma, depression, hypertension, myocardial infarction, osteoporosis-had been having pneumonia recurrent for last 1 year and required multiple doses and course of antibiotic treatments so far.She is noted to have worsening in her pneumonia and possible complicated effusion along with  that.  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 D/c vancomycin - pulmonologist consult appreciated --recommends Dr Genevive Bi to see pt -D/w dr oaks--recommends IR to drain pleural fluid prior to considering decortication  *COPD We will give DuoNeb and Spiriva.  *Hypertension Continue metoprolol.  *Hyperlipidemia Continue simvastatin.  *Elevated PT/INR affected from liver disorder. Changes of early cirrhosis noted on CT chest -patient does have history of alcohol use in the past. -Currently she denies drinking alcohol but will have social drinks -CT of the abdomen shows early changes of cirrhosis. Her iron studies are negative. Hemoccult negative. Appears anemia of chronic disease. This was discussed with Dr. Allen Norris -transfuse as needed.  D/w pt and sister   Case discussed with Care Management/Social Worker. Management plans discussed with the patient, family and they are in agreement.  CODE STATUS: DNR  DVT Prophylaxis: lovenox  TOTAL TIME TAKING CARE OF THIS PATIENT: *30* minutes.  >50% time spent on counselling and coordination of care  POSSIBLE D/C IN *fewDAYS, DEPENDING ON CLINICAL CONDITION.  Note: This dictation was prepared with Dragon dictation along with smaller phrase technology. Any transcriptional errors that result from this process are unintentional.  Fritzi Mandes M.D on 04/11/2018 at 3:42 PM  Between 7am to 6pm - Pager - [phone]  After 6pm go to [email or web address] - password [email or web address] [facility]  Office  [phone]  CC: Primary care physician; Glendon Axe, MDPatient ID: Wendy Arias, female   DOB: [birth year], 67 y.o.   MRN: [id number]

## 2018-04-11 NOTE — Consult Note (Signed)
Wendy Lame, MD [facility]  [address], [place], [place] [address] Phone: [phone] Fax : [phone]  Consultation  Referring Provider:     Dr. Posey Pronto Primary Care Physician:  Glendon Axe, MD Primary Gastroenterologist:  Dr. Gustavo Lah         Reason for Consultation:     Anemia and possible cirrhosis  Date of Admission:  04/09/2018 Date of Consultation:  04/11/2018         HPI:   Wendy Arias is a 67 y.o. female who was admitted with shortness of breath and pleuritic chest pain.  The patient has previous leaf been found to have a lung lesion and has a fluid collection on CT consistent with a possible empyema.  The patient was started on antibiotics and Dr. Genevive Bi was consulted.  The patient was found to have what appeared to be a nodular liver on her chest CT and was also found to have anemia.  The patient was also noted to have anemia hemoglobin of 7.7.  The patient's hemoglobin on admission was 8.1 with previous hemoglobin in May of this year being 12.5.  When I was consult this morning to see the patient I had asked the hospitalist to obtain a dedicated CT scan of the abdomen to better characterize the liver.  The CT scan showed a enlarged liver spanning 21 cm with nodularity suggestive of possible early cirrhosis.  I also suggested that the patient have stool sent off for occult blood and iron studies done. The patient has had chronic abdominal pain and is made worse when she coughs or bends over.  Past Medical History:  Diagnosis Date  . Anxiety   . Asthma   . Depression   . Hypertension   . Myocardial infarction ([place])   . Osteoporosis     Past Surgical History:  Procedure Laterality Date  . ABDOMINAL HYSTERECTOMY     partial  . CARDIAC CATHETERIZATION    . CARDIAC SURGERY    . COLONOSCOPY WITH PROPOFOL N/A 04/12/2017   Procedure: COLONOSCOPY WITH PROPOFOL;  Surgeon: Lollie Sails, MD;  Location: [facility] ENDOSCOPY;  Service: Endoscopy;  Laterality: N/A;  .  CORONARY ANGIOPLASTY    . ENDOBRONCHIAL ULTRASOUND N/A 11/28/2017   Procedure: ENDOBRONCHIAL ULTRASOUND;  Surgeon: Laverle Hobby, MD;  Location: [facility] ORS;  Service: Pulmonary;  Laterality: N/A;  . REDUCTION MAMMAPLASTY      Prior to Admission medications   Medication Sig Start Date End Date Taking? Authorizing Provider  albuterol (PROVENTIL HFA;VENTOLIN HFA) 108 (90 Base) MCG/ACT inhaler Inhale into the lungs every 6 (six) hours as needed for wheezing or shortness of breath.   Yes [provider]  ALPRAZolam Duanne Moron) 1 MG tablet Take 1 mg by mouth 3 (three) times daily as needed for anxiety.  10/05/17  Yes [provider]  busPIRone (BUSPAR) 15 MG tablet Take 15 mg by mouth 2 (two) times daily at 10 AM and 5 PM. 03/24/18  Yes [provider]  hydrOXYzine (ATARAX/VISTARIL) 25 MG tablet Take 25-100 mg by mouth as needed (sleep).  11/10/17  Yes [provider]  ibuprofen (ADVIL,MOTRIN) 400 MG tablet Take 1 tablet (400 mg total) by mouth every 6 (six) hours. 02/19/18  Yes Fritzi Mandes, MD  metoprolol succinate (TOPROL-XL) 100 MG 24 hr tablet Take 100 mg by mouth daily. 02/28/18  Yes [provider]  pantoprazole (PROTONIX) 40 MG tablet Take 40 mg by mouth 2 (two) times daily. 02/28/18  Yes [provider]  venlafaxine XR (EFFEXOR-XR) 75 MG 24 hr capsule Take 75 mg by mouth daily with breakfast.    Yes [provider]  vitamin B-12 (CYANOCOBALAMIN) 1000 MCG tablet Take 1,000 mcg by mouth daily.   Yes [provider]  vitamin k 100 MCG tablet Take 1 tablet (100 mcg total) by mouth daily. 03/22/18  Yes Laverle Hobby, MD  fentaNYL (DURAGESIC - DOSED MCG/HR) 25 MCG/HR patch Place 1 patch (25 mcg total) onto the skin every 3 (three) days. Patient not taking: Reported on 04/10/2018 02/21/18   Fritzi Mandes, MD  HYDROcodone-acetaminophen (NORCO/VICODIN) 5-325 MG tablet Take 1-2 tablets by mouth every 6 (six) hours as needed for moderate  pain or severe pain. Patient not taking: Reported on 03/22/2018 03/03/18   Laverle Hobby, MD  tiotropium (SPIRIVA) 18 MCG inhalation capsule Place 1 capsule (18 mcg total) into inhaler and inhale daily. Patient not taking: Reported on 03/07/2018 11/22/17 02/16/18  Laverle Hobby, MD    Family History  Problem Relation Age of Onset  . Breast cancer Mother 32  . Hypertension Mother   . Dementia Mother   . Breast cancer Maternal Aunt 70  . Breast cancer Other 35  . Hypertension Father   . Heart disease Father   . Diabetes Father   . Diabetes Sister      Social History   Tobacco Use  . Smoking status: Current Some Day Smoker    Packs/day: 0.25    Years: 20.00    Pack years: 5.00    Types: Cigarettes  . Smokeless tobacco: Never Used  Substance Use Topics  . Alcohol use: Yes    Alcohol/week: 3.0 standard drinks    Types: 1 Glasses of wine, 2 Cans of beer per week    Comment: rarely  . Drug use: Never    Allergies as of 04/09/2018 - Review Complete 04/09/2018  Allergen Reaction Noted  . Penicillins Other (See Comments) 09/14/2016    Review of Systems:    All systems reviewed and negative except where noted in HPI.   Physical Exam:  Vital signs in last 24 hours: Temp:  [97.7 F (36.5 C)-98.1 F (36.7 C)] 97.7 F (36.5 C) (10/15 0752) Pulse Rate:  [55-61] 61 (10/15 0752) Resp:  [19-20] 20 (10/15 0752) BP: (115-162)/(78-90) 162/89 (10/15 0752) SpO2:  [97 %-99 %] 99 % (10/15 0752) Last BM Date: 04/11/18 General:   Pleasant, cooperative in NAD Head:  Normocephalic and atraumatic. Eyes:   No icterus.   Conjunctiva pink. PERRLA. Ears:  Normal auditory acuity. Neck:  Supple; no masses or thyroidomegaly Lungs: Respirations even and unlabored. Lungs clear to auscultation bilaterally.   No wheezes, crackles, or rhonchi.  Heart:  Regular rate and rhythm;  Without murmur, clicks, rubs or gallops Abdomen:  Soft, nondistended, tenderness to one finger palpation while  flexing the abdominal wall muscles. Normal bowel sounds. No appreciable masses or hepatomegaly.  No rebound or guarding.  Rectal:  Not performed. Msk:  Symmetrical without gross deformities.    Extremities:  Without edema, cyanosis or clubbing. Neurologic:  Alert and oriented x3;  grossly normal neurologically. Skin:  Intact without significant lesions or rashes. Cervical Nodes:  No significant cervical adenopathy. Psych:  Alert and cooperative. Normal affect.  LAB RESULTS: Recent Labs    04/09/18 1446 04/10/18 0253 04/11/18 0749  WBC 12.0* 13.0*  --   HGB 8.1* 7.7* 7.7*  HCT 26.9* 25.4*  --   PLT 332 299  --    BMET Recent Labs  04/09/18 1446 04/10/18 0253  NA 134* 134*  K 3.8 3.7  CL 98 100  CO2 25 26  GLUCOSE 128* 84  BUN 13 11  CREATININE 0.79 0.71  CALCIUM 7.9* 7.7*   LFT No results for input(s): PROT, ALBUMIN, AST, ALT, ALKPHOS, BILITOT, BILIDIR, IBILI in the last 72 hours. PT/INR Recent Labs    04/10/18 1632  LABPROT 15.7*  INR 1.26    STUDIES: Ct Chest W Contrast  Result Date: 04/09/2018 CLINICAL DATA:  Worsening cough and chest pain. Status post multiple rounds of antibiotics. Pneumonia, unresolved. EXAM: CT CHEST WITH CONTRAST TECHNIQUE: Multidetector CT imaging of the chest was performed during intravenous contrast administration. CONTRAST:  62mL OMNIPAQUE IOHEXOL 300 MG/ML  SOLN COMPARISON:  Chest CT dated 02/16/2018. FINDINGS: Cardiovascular: Borderline cardiomegaly. No pericardial effusion. No thoracic aortic aneurysm. Scattered aortic atherosclerosis. Mediastinum/Nodes: Stable moderate-sized lymph nodes within the mediastinum, likely reactive. Esophagus is unremarkable. Trachea appears normal. Lungs/Pleura: Intervally developed empyema along the lateral pleural surface of the RIGHT lower lung, measuring at least 10 cm craniocaudal extent with AP measurement of approximately 7 cm and thickness of approximately 3 cm. Multiple additional empyemas along  the posterior pleural surface of the RIGHT lower lung, multiloculated configuration, with measurements ranging from 2.5-5 cm greatest dimension, with intervening dense consolidations. LEFT lung is clear.  No pneumothorax. Upper Abdomen: No acute findings. Liver contours are slightly nodular suggesting cirrhosis. Musculoskeletal: Severe compression fracture deformity of the L1 vertebral body, with at least 80% loss of anterior vertebral body height, stable compared to the earlier chest CT of 02/16/2018. No new osseous abnormality. IMPRESSION: 1. Multiple empyemas on the RIGHT. Dominant empyema along the lateral pleural surface of the RIGHT mid/lower lung, measuring at least 10 cm craniocaudal extent with approximately 7 cm AP dimension and approximately 3 cm thickness. 2. Complex/multiloculated empyemas along the posterior pleural surface of the RIGHT lower lung, with at least 4 separate collections identified with sizes ranging from 2.5 to 5 cm greatest dimension. Some component of pulmonary abscess is possible, but the predominantly lentiform configurations suggest empyemas rather than abscess. Dense consolidations are interposed about the empyemas, presumed pneumonia. 3. LEFT lung is clear. 4. Suspected liver cirrhosis, incompletely imaged. 5. Severe compression fracture of the L1 vertebral body, stable compared to earlier CT of 02/16/2018. Aortic Atherosclerosis (ICD10-I70.0). Electronically Signed   By: Franki Cabot M.D.   On: 04/09/2018 18:03   Ct Abdomen Pelvis W Contrast  Result Date: 04/11/2018 CLINICAL DATA:  67 year old female with upper abdominal pain off and on for the past year. Prior hysterectomy. Subsequent encounter. EXAM: CT ABDOMEN AND PELVIS WITH CONTRAST TECHNIQUE: Multidetector CT imaging of the abdomen and pelvis was performed using the standard protocol following bolus administration of intravenous contrast. CONTRAST:  29mL OMNIPAQUE IOHEXOL 300 MG/ML  SOLN COMPARISON:  04/09/2018 chest  CT. 11/22/2017 head CT. 11/14/2017 CT abdomen and pelvis. FINDINGS: Lower chest: Multiple right lung empyemas as noted on recent chest CT. Heart slightly enlarged. Coronary artery calcification. Mediastinal shift to the right. Hepatobiliary: Enlarged liver spanning over 21 cm. Slightly lobular contour raising possibility of cirrhosis. Left lobe liver 3.4 cm cyst as previously noted. No worrisome hepatic lesion identified. Contracted gallbladder with mild wall thickening which may be related to contracted state. Limited for evaluating for cholecystitis given the third spacing of fluid. No calcified gallstone. Pancreas: No worrisome pancreatic mass or inflammation. Spleen: No splenic mass or enlargement. Adrenals/Urinary Tract: No obstructing stone or hydronephrosis. No worrisome renal, adrenal or urinary bladder  mass. Stomach/Bowel: Prominent stool right colon and proximal transverse colon. Evaluation for bowel inflammatory process slightly limited by mild third spacing of fluid. No discrete extraluminal bowel inflammatory process noted. Appendix not visualized, possibly removed at time of hysterectomy. Distal esophagus immediately adjacent empyema. Vascular/Lymphatic: Atherosclerotic changes aorta and aortic branch vessels. Infrarenal ectasia with aorta measuring up to 2.5 cm maximal transverse dimension. Prominent narrowing iliac arteries. Prominent narrowing origin inferior mesenteric artery. Scattered normal size lymph nodes. Reproductive: Post hysterectomy.  No worrisome adnexal mass. Other: Third spacing of fluid.  No free intraperitoneal air. Musculoskeletal: Chronic L1 anterior wedge compression deformity with 80% loss of height anteriorly and 60% loss of height posteriorly with retropulsion posterior inferior aspect narrowing the ventral canal approaching the conus. Kyphosis centered at this level similar to 02/16/2018 chest CT. IMPRESSION: 1. Multiple right lung empyemas as noted on recent chest CT. 2.  Third spacing of fluid. 3. Prominent stool right colon and proximal transverse colon. Evaluation for bowel inflammatory process slightly limited by mild third spacing of fluid. No discrete extraluminal bowel inflammatory process noted. Appendix not visualized, possibly removed at time of hysterectomy. 4. Enlarged liver spanning over 21 cm. Slightly lobular contour raising possibility of cirrhosis. Left lobe liver 3.4 cm cyst as previously noted. No worrisome hepatic lesion identified. 5. Contracted gallbladder with mild wall thickening which may be related to contracted state. Limited for evaluating for cholecystitis given the third spacing of fluid. 6. Aortic Atherosclerosis (ICD10-I70.0). Atherosclerotic changes aorta and aortic branch vessels. Infrarenal ectasia with aorta measuring up to 2.5 cm maximal transverse dimension. Ectatic abdominal aorta at risk for aneurysm development. Recommend followup by ultrasound in 5 years. This recommendation follows ACR consensus guidelines: White Paper of the ACR Incidental Findings Committee II on Vascular Findings. J Am Coll Radiol 2013; 10:789-794. 7. Prominent narrowing iliac arteries. Prominent narrowing origin inferior mesenteric artery. 8. Mild cardiomegaly.  Coronary artery calcifications. 9. Chronic L1 anterior wedge compression fracture with retropulsion as detailed above. Electronically Signed   By: Genia Del M.D.   On: 04/11/2018 13:33      Impression / Plan:   Assessment: Principal Problem:   Abscess of middle lobe of right lung with pneumonia ([place]) Active Problems:   Pneumonia   Empyema of right pleural space (HCC)   Wendy Arias is a 67 y.o. y/o female with empyema and anemia.  The patient's indices are consistent with this patient having anemia of chronic disease with her stool occult card being negative and iron studies being normal.  The patient may have early cirrhotic changes with a nodule liver but unlikely has advanced cirrhosis due to  her liver not being strong skin and small but being enlarged at 21 cm.  The patient denies any sign of any GI bleeding.  Plan:  This patient has what appears to be anemia of chronic disease with normal iron studies and stools that are heme negative.  The patient's nodular liver may represent early cirrhosis.  The patient's cirrhosis may be worked up as an outpatient by her primary gastroenterologist Dr. Gustavo Lah.  The patient's abdominal pain is clearly musculoskeletal and made worse with flexion of the abdominal wall muscles.  Nothing further to do from a GI point of view at this time.  Thank you for involving me in the care of this patient.      LOS: 2 days   Wendy Lame, MD  04/11/2018, 3:55 PM    Note: This dictation was prepared with Dragon dictation along with  smaller phrase technology. Any transcriptional errors that result from this process are unintentional.

## 2018-04-12 ENCOUNTER — Inpatient Hospital Stay: Payer: PPO

## 2018-04-12 ENCOUNTER — Ambulatory Visit: Admission: RE | Admit: 2018-04-12 | Payer: PPO | Source: Ambulatory Visit

## 2018-04-12 DIAGNOSIS — R899 Unspecified abnormal finding in specimens from other organs, systems and tissues: Secondary | ICD-10-CM

## 2018-04-12 LAB — LACTATE DEHYDROGENASE, PLEURAL OR PERITONEAL FLUID: LD, Fluid: >10000 U/L — ABNORMAL HIGH (ref 3–23)

## 2018-04-12 LAB — PROTIME-INR
INR: 1.21
PROTHROMBIN TIME: 15.2 s (ref 11.4–15.2)

## 2018-04-12 LAB — APTT: aPTT: 40 seconds — ABNORMAL HIGH (ref 24–36)

## 2018-04-12 LAB — GLUCOSE, PLEURAL OR PERITONEAL FLUID: Glucose, Fluid: <20 mg/dL

## 2018-04-12 MED ORDER — IBUPROFEN 400 MG PO TABS
400.0000 mg | ORAL_TABLET | Freq: Four times a day (QID) | ORAL | Status: DC | PRN
  Administered 2018-04-12 – 2018-04-14 (×3): 400 mg via ORAL
  Filled 2018-04-12 (×3): qty 1

## 2018-04-12 MED ORDER — FENTANYL CITRATE (PF) 100 MCG/2ML IJ SOLN
INTRAMUSCULAR | Status: AC
  Filled 2018-04-12: qty 4

## 2018-04-12 MED ORDER — SODIUM CHLORIDE 0.9 % IV SOLN
INTRAVENOUS | Status: AC | PRN
  Administered 2018-04-12: 10 mL/h via INTRAVENOUS

## 2018-04-12 MED ORDER — MIDAZOLAM HCL 5 MG/5ML IJ SOLN
INTRAMUSCULAR | Status: AC | PRN
  Administered 2018-04-12: 1 mg via INTRAVENOUS

## 2018-04-12 MED ORDER — HEPARIN SODIUM (PORCINE) 5000 UNIT/ML IJ SOLN
5000.0000 [IU] | Freq: Three times a day (TID) | INTRAMUSCULAR | Status: DC
  Administered 2018-04-12 – 2018-04-14 (×6): 5000 [IU] via SUBCUTANEOUS
  Filled 2018-04-12 (×5): qty 1

## 2018-04-12 MED ORDER — MIDAZOLAM HCL 5 MG/5ML IJ SOLN
INTRAMUSCULAR | Status: AC
  Filled 2018-04-12: qty 5

## 2018-04-12 MED ORDER — HYDROCODONE-ACETAMINOPHEN 10-325 MG PO TABS
1.0000 | ORAL_TABLET | Freq: Once | ORAL | Status: AC
  Administered 2018-04-12: 1 via ORAL
  Filled 2018-04-12: qty 1

## 2018-04-12 MED ORDER — FENTANYL CITRATE (PF) 100 MCG/2ML IJ SOLN
INTRAMUSCULAR | Status: AC | PRN
  Administered 2018-04-12: 50 ug via INTRAVENOUS

## 2018-04-12 NOTE — Progress Notes (Signed)
[place] [facility] [place] NAME: Wendy Arias    MR#:  [id number]  DATE OF BIRTH:  19-May-1951  SUBJECTIVE:   Patient came in with increasing shortness of breath and pleuritic chest pain. Feels better today. REVIEW OF SYSTEMS:   Review of Systems  Constitutional: Negative for chills, fever and weight loss.  HENT: Negative for ear discharge, ear pain and nosebleeds.   Eyes: Negative for blurred vision, pain and discharge.  Respiratory: Positive for shortness of breath. Negative for sputum production, wheezing and stridor.   Cardiovascular: Positive for chest pain. Negative for palpitations, orthopnea and PND.  Gastrointestinal: Negative for abdominal pain, diarrhea, nausea and vomiting.  Genitourinary: Negative for frequency and urgency.  Musculoskeletal: Negative for back pain and joint pain.  Neurological: Negative for sensory change, speech change, focal weakness and weakness.  Psychiatric/Behavioral: Negative for depression and hallucinations. The patient is not nervous/anxious.    Tolerating Diet:yes Tolerating PT: not needed  DRUG ALLERGIES:   Allergies  Allergen Reactions  . Penicillins Other (See Comments)    Has patient had a PCN reaction causing immediate rash, facial/tongue/throat swelling, SOB or lightheadedness with hypotension: Yes Has patient had a PCN reaction causing severe rash involving mucus membranes or skin necrosis: Unknown Has patient had a PCN reaction that required hospitalization: Unknown Has patient had a PCN reaction occurring within the last 10 years: Unknown If all of the above answers are "NO", then may proceed with Cephalosporin use.    VITALS:  Blood pressure 95/65, pulse 62, temperature 98.1 F (36.7 C), temperature source Oral, resp. rate 16, height 5\' 2"  (1.575 m), weight 51.7 kg, SpO2 95 %.  PHYSICAL EXAMINATION:   Physical Exam  GENERAL:  67 y.o.-year-old patient lying in the bed with no  acute distress.  EYES: Pupils equal, round, reactive to light and accommodation. No scleral icterus. Extraocular muscles intact.  HEENT: Head atraumatic, normocephalic. Oropharynx and nasopharynx clear.  NECK:  Supple, no jugular venous distention. No thyroid enlargement, no tenderness.  LUNGS: decreased breath sounds right side,no wheezing, rales, rhonchi. No use of accessory muscles of respiration.  CARDIOVASCULAR: S1, S2 normal. No murmurs, rubs, or gallops.  ABDOMEN: Soft, nontender, nondistended. Bowel sounds present. No organomegaly or mass.  EXTREMITIES: No cyanosis, clubbing or edema b/l.    NEUROLOGIC: Cranial nerves II through XII are intact. No focal Motor or sensory deficits b/l.   PSYCHIATRIC:  patient is alert and oriented x 3.  SKIN: No obvious rash, lesion, or ulcer.   LABORATORY PANEL:  CBC Recent Labs  Lab 04/10/18 0253 04/11/18 0749  WBC 13.0*  --   HGB 7.7* 7.7*  HCT 25.4*  --   PLT 299  --     Chemistries  Recent Labs  Lab 04/10/18 0253  NA 134*  K 3.7  CL 100  CO2 26  GLUCOSE 84  BUN 11  CREATININE 0.71  CALCIUM 7.7*   Cardiac Enzymes Recent Labs  Lab 04/09/18 1446  TROPONINI 0.05*   RADIOLOGY:  Ct Abdomen Pelvis W Contrast  Result Date: 04/11/2018 CLINICAL DATA:  67 year old female with upper abdominal pain off and on for the past year. Prior hysterectomy. Subsequent encounter. EXAM: CT ABDOMEN AND PELVIS WITH CONTRAST TECHNIQUE: Multidetector CT imaging of the abdomen and pelvis was performed using the standard protocol following bolus administration of intravenous contrast. CONTRAST:  54mL OMNIPAQUE IOHEXOL 300 MG/ML  SOLN COMPARISON:  04/09/2018 chest CT. 11/22/2017 head CT. 11/14/2017 CT abdomen and pelvis.  FINDINGS: Lower chest: Multiple right lung empyemas as noted on recent chest CT. Heart slightly enlarged. Coronary artery calcification. Mediastinal shift to the right. Hepatobiliary: Enlarged liver spanning over 21 cm. Slightly lobular  contour raising possibility of cirrhosis. Left lobe liver 3.4 cm cyst as previously noted. No worrisome hepatic lesion identified. Contracted gallbladder with mild wall thickening which may be related to contracted state. Limited for evaluating for cholecystitis given the third spacing of fluid. No calcified gallstone. Pancreas: No worrisome pancreatic mass or inflammation. Spleen: No splenic mass or enlargement. Adrenals/Urinary Tract: No obstructing stone or hydronephrosis. No worrisome renal, adrenal or urinary bladder mass. Stomach/Bowel: Prominent stool right colon and proximal transverse colon. Evaluation for bowel inflammatory process slightly limited by mild third spacing of fluid. No discrete extraluminal bowel inflammatory process noted. Appendix not visualized, possibly removed at time of hysterectomy. Distal esophagus immediately adjacent empyema. Vascular/Lymphatic: Atherosclerotic changes aorta and aortic branch vessels. Infrarenal ectasia with aorta measuring up to 2.5 cm maximal transverse dimension. Prominent narrowing iliac arteries. Prominent narrowing origin inferior mesenteric artery. Scattered normal size lymph nodes. Reproductive: Post hysterectomy.  No worrisome adnexal mass. Other: Third spacing of fluid.  No free intraperitoneal air. Musculoskeletal: Chronic L1 anterior wedge compression deformity with 80% loss of height anteriorly and 60% loss of height posteriorly with retropulsion posterior inferior aspect narrowing the ventral canal approaching the conus. Kyphosis centered at this level similar to 02/16/2018 chest CT. IMPRESSION: 1. Multiple right lung empyemas as noted on recent chest CT. 2. Third spacing of fluid. 3. Prominent stool right colon and proximal transverse colon. Evaluation for bowel inflammatory process slightly limited by mild third spacing of fluid. No discrete extraluminal bowel inflammatory process noted. Appendix not visualized, possibly removed at time of  hysterectomy. 4. Enlarged liver spanning over 21 cm. Slightly lobular contour raising possibility of cirrhosis. Left lobe liver 3.4 cm cyst as previously noted. No worrisome hepatic lesion identified. 5. Contracted gallbladder with mild wall thickening which may be related to contracted state. Limited for evaluating for cholecystitis given the third spacing of fluid. 6. Aortic Atherosclerosis (ICD10-I70.0). Atherosclerotic changes aorta and aortic branch vessels. Infrarenal ectasia with aorta measuring up to 2.5 cm maximal transverse dimension. Ectatic abdominal aorta at risk for aneurysm development. Recommend followup by ultrasound in 5 years. This recommendation follows ACR consensus guidelines: White Paper of the ACR Incidental Findings Committee II on Vascular Findings. J Am Coll Radiol 2013; 10:789-794. 7. Prominent narrowing iliac arteries. Prominent narrowing origin inferior mesenteric artery. 8. Mild cardiomegaly.  Coronary artery calcifications. 9. Chronic L1 anterior wedge compression fracture with retropulsion as detailed above. Electronically Signed   By: Genia Del M.D.   On: 04/11/2018 13:33   ASSESSMENT AND PLAN:  Jene Huq  is a 67 y.o. female with a known history of anxiety, asthma, depression, hypertension, myocardial infarction, osteoporosis-had been having pneumonia recurrent for last 1 year and required multiple doses and course of antibiotic treatments so far.She is noted to have worsening in her pneumonia and possible complicated effusion along with that.  *Complicated multiple empyema  -pt has worsening in CT chest with large collection of would suggest empyema -Broad-spectrum antibiotics for now -- continue IV cefepime. D/c vancomycin - pulmonologist consult appreciated --recommends Dr Genevive Bi to see pt -D/w dr oaks--recommends IR to drain pleural fluid prior to considering decortication. -pt is scheduled for CT guided drain today  *COPD We will give DuoNeb and  Spiriva.  *Hypertension Continue metoprolol.  *Hyperlipidemia Continue simvastatin.  *Elevated PT/INR affected from  liver disorder. Changes of early cirrhosis noted on CT chest -patient does have history of alcohol use in the past. -Currently she denies drinking alcohol but will have social drinks -CT of the abdomen shows early changes of cirrhosis. Her iron studies are negative. Hemoccult negative. Appears anemia of chronic disease. This was discussed with Dr. Allen Norris -transfuse as needed.  D/w pt and sister   Case discussed with Care Management/Social Worker. Management plans discussed with the patient, family and they are in agreement.  CODE STATUS: DNR  DVT Prophylaxis: lovenox  TOTAL TIME TAKING CARE OF THIS PATIENT: *30* minutes.  >50% time spent on counselling and coordination of care  POSSIBLE D/C IN *fewDAYS, DEPENDING ON CLINICAL CONDITION.  Note: This dictation was prepared with Dragon dictation along with smaller phrase technology. Any transcriptional errors that result from this process are unintentional.  Fritzi Mandes M.D on 04/12/2018 at 9:16 AM  Between 7am to 6pm - Pager - [phone]  After 6pm go to [email or web address] - password [email or web address] [facility]  Office  [phone]  CC: Primary care physician; Glendon Axe, MDPatient ID: Wendy Arias, female   DOB: 28-Jun-1951, 67 y.o.   MRN: [id number]

## 2018-04-12 NOTE — Progress Notes (Signed)
[place] for Post-IR anticoagulation continuation Indication: VTE prophylaxis  Patient Measurements: Height: 5\' 2"  (157.5 cm) Weight: 114 lb (51.7 kg) IBW/kg (Calculated) : 50.1  Labs: Recent Labs    04/09/18 1446 04/10/18 0253 04/10/18 1632 04/11/18 0749 04/12/18 0431  HGB 8.1* 7.7*  --  7.7*  --   HCT 26.9* 25.4*  --   --   --   PLT 332 299  --   --   --   APTT  --   --  38*  --  40*  LABPROT  --   --  15.7*  --  15.2  INR  --   --  1.26  --  1.21  CREATININE 0.79 0.71  --   --   --   TROPONINI 0.05*  --   --   --   --     Estimated Creatinine Clearance: 53.2 mL/min (by C-G formula based on SCr of 0.71 mg/dL).  Assessment: 67 year-old female s/p CT-guided aspiration of right pleural collection  Plan:  Continue subQ heparin beginning 4 hours after procedure  Dallie Piles, PharmD 04/12/2018,11:50 AM

## 2018-04-12 NOTE — Plan of Care (Signed)

## 2018-04-12 NOTE — Progress Notes (Signed)
  Patient ID: Wendy Arias, female   DOB: 03-28-1951, 67 y.o.   MRN: [id number]  HISTORY: Still with cough and shortness of breath.     Vitals:   04/12/18 0045 04/12/18 0919  BP: 95/65 129/88  Pulse: 62 68  Resp: 16 18  Temp: 98.1 F (36.7 C) 97.8 F (36.6 C)  SpO2: 95% 97%     EXAM:    Resp: Lungs are diminished at the right base.  No respiratory distress, normal effort. Heart:  Regular without murmurs Abd:  Abdomen is soft, non distended and non tender. No masses are palpable.  There is no rebound and no guarding.  Neurological: Alert and oriented to person, place, and time. Coordination normal.  Skin: Skin is warm and dry. No rash noted. No diaphoretic. No erythema. No pallor.  Psychiatric: Normal mood and affect. Normal behavior. Judgment and thought content normal.    ASSESSMENT: Complex right lung infiltrate with loculated pleural effusion    PLAN:   Long discussion with Dr. Posey Pronto yesterday.  The anemia is being attributed to chronic disease and cirrhosis is no longer a diagnosis.  I explained that I am not convinced she has an empyema.  She certainly does have a complex lung mass/infiltrate with pleural effusion.  She is scheduled for a thoracentesis today.  I am not certain that she would clinically improve after decortication without adequate therapy for her parenchymal lung process.      Nestor Lewandowsky, MDPatient ID: Wendy Arias, female   DOB: Jan 21, 1951, 67 y.o.   MRN: [id number]

## 2018-04-12 NOTE — Procedures (Signed)
  Procedure: CT aspiration R pleural collection 35ml purulent, for GS C&S EBL:   minimal Complications:  none immediate  See full dictation in [facility].  Dillard Cannon MD Main # [phone] Pager  [phone]

## 2018-04-13 DIAGNOSIS — K769 Liver disease, unspecified: Secondary | ICD-10-CM

## 2018-04-13 DIAGNOSIS — F17201 Nicotine dependence, unspecified, in remission: Secondary | ICD-10-CM

## 2018-04-13 DIAGNOSIS — J851 Abscess of lung with pneumonia: Principal | ICD-10-CM

## 2018-04-13 DIAGNOSIS — R1013 Epigastric pain: Secondary | ICD-10-CM

## 2018-04-13 DIAGNOSIS — F419 Anxiety disorder, unspecified: Secondary | ICD-10-CM

## 2018-04-13 DIAGNOSIS — K08409 Partial loss of teeth, unspecified cause, unspecified class: Secondary | ICD-10-CM

## 2018-04-13 DIAGNOSIS — J449 Chronic obstructive pulmonary disease, unspecified: Secondary | ICD-10-CM

## 2018-04-13 DIAGNOSIS — D649 Anemia, unspecified: Secondary | ICD-10-CM

## 2018-04-13 DIAGNOSIS — M8008XD Age-related osteoporosis with current pathological fracture, vertebra(e), subsequent encounter for fracture with routine healing: Secondary | ICD-10-CM

## 2018-04-13 DIAGNOSIS — J869 Pyothorax without fistula: Secondary | ICD-10-CM

## 2018-04-13 DIAGNOSIS — Z791 Long term (current) use of non-steroidal anti-inflammatories (NSAID): Secondary | ICD-10-CM

## 2018-04-13 DIAGNOSIS — I10 Essential (primary) hypertension: Secondary | ICD-10-CM

## 2018-04-13 DIAGNOSIS — R410 Disorientation, unspecified: Secondary | ICD-10-CM

## 2018-04-13 DIAGNOSIS — R443 Hallucinations, unspecified: Secondary | ICD-10-CM

## 2018-04-13 LAB — CBC
HCT: 27.3 % — ABNORMAL LOW (ref 36.0–46.0)
Hemoglobin: 8.1 g/dL — ABNORMAL LOW (ref 12.0–15.0)
MCH: 28.7 pg (ref 26.0–34.0)
MCHC: 29.7 g/dL — AB (ref 30.0–36.0)
MCV: 96.8 fL (ref 80.0–100.0)
NRBC: 0 % (ref 0.0–0.2)
PLATELETS: 288 10*3/uL (ref 150–400)
RBC: 2.82 MIL/uL — ABNORMAL LOW (ref 3.87–5.11)
RDW: 22.4 % — AB (ref 11.5–15.5)
WBC: 11.1 10*3/uL — ABNORMAL HIGH (ref 4.0–10.5)

## 2018-04-13 LAB — URINALYSIS, COMPLETE (UACMP) WITH MICROSCOPIC
Bacteria, UA: NONE SEEN
Bilirubin Urine: NEGATIVE
Glucose, UA: NEGATIVE mg/dL
HGB URINE DIPSTICK: NEGATIVE
Ketones, ur: 5 mg/dL — AB
LEUKOCYTES UA: NEGATIVE
Nitrite: NEGATIVE
PROTEIN: 100 mg/dL — AB
SPECIFIC GRAVITY, URINE: 1.034 — AB (ref 1.005–1.030)
pH: 5 (ref 5.0–8.0)

## 2018-04-13 LAB — BASIC METABOLIC PANEL
Anion gap: 8 (ref 5–15)
BUN: 16 mg/dL (ref 8–23)
CALCIUM: 7.8 mg/dL — AB (ref 8.9–10.3)
CO2: 26 mmol/L (ref 22–32)
Chloride: 101 mmol/L (ref 98–111)
Creatinine, Ser: 0.76 mg/dL (ref 0.44–1.00)
GFR calc Af Amer: >60 mL/min (ref >60)
Glucose, Bld: 199 mg/dL — ABNORMAL HIGH (ref 70–99)
POTASSIUM: 4.7 mmol/L (ref 3.5–5.1)
Sodium: 135 mmol/L (ref 135–145)

## 2018-04-13 LAB — HEPATIC FUNCTION PANEL
ALT: 20 U/L (ref 0–44)
AST: 24 U/L (ref 15–41)
Albumin: 2 g/dL — ABNORMAL LOW (ref 3.5–5.0)
Alkaline Phosphatase: 197 U/L — ABNORMAL HIGH (ref 38–126)
BILIRUBIN DIRECT: 0.2 mg/dL (ref 0.0–0.2)
BILIRUBIN INDIRECT: 0.3 mg/dL (ref 0.3–0.9)
BILIRUBIN TOTAL: 0.5 mg/dL (ref 0.3–1.2)
Total Protein: 5.9 g/dL — ABNORMAL LOW (ref 6.5–8.1)

## 2018-04-13 LAB — AMMONIA: Ammonia: <9 umol/L — ABNORMAL LOW (ref 9–35)

## 2018-04-13 LAB — PROTEIN, BODY FLUID (OTHER): TOTAL PROTEIN, BODY FLUID OTHER: 2.1 g/dL

## 2018-04-13 MED ORDER — ALBUMIN HUMAN 25 % IV SOLN
25.0000 g | Freq: Two times a day (BID) | INTRAVENOUS | Status: AC
  Administered 2018-04-13 – 2018-04-14 (×3): 25 g via INTRAVENOUS
  Filled 2018-04-13 (×3): qty 100

## 2018-04-13 MED ORDER — ALBUTEROL SULFATE (2.5 MG/3ML) 0.083% IN NEBU
2.5000 mg | INHALATION_SOLUTION | Freq: Three times a day (TID) | RESPIRATORY_TRACT | Status: DC
  Administered 2018-04-14 (×2): 2.5 mg via RESPIRATORY_TRACT
  Filled 2018-04-13 (×2): qty 3

## 2018-04-13 MED ORDER — SODIUM CHLORIDE 0.9 % IV SOLN
INTRAVENOUS | Status: DC | PRN
  Administered 2018-04-13 – 2018-04-14 (×3): 250 mL via INTRAVENOUS

## 2018-04-13 MED ORDER — ROPINIROLE HCL 0.25 MG PO TABS
0.2500 mg | ORAL_TABLET | Freq: Two times a day (BID) | ORAL | Status: DC
  Administered 2018-04-13 – 2018-04-14 (×3): 0.25 mg via ORAL
  Filled 2018-04-13 (×4): qty 1

## 2018-04-13 MED ORDER — SODIUM CHLORIDE 0.9 % IV SOLN
3.0000 g | Freq: Four times a day (QID) | INTRAVENOUS | Status: DC
  Administered 2018-04-13 – 2018-04-14 (×4): 3 g via INTRAVENOUS
  Filled 2018-04-13 (×6): qty 3

## 2018-04-13 NOTE — Progress Notes (Signed)
[place] [facility] [place] NAME: Wendy Arias    MR#:  [id number]  DATE OF BIRTH:  07/31/1950  SUBJECTIVE:  CHIEF COMPLAINT:   Chief Complaint  Patient presents with  . Shortness of Breath   -Sister at bedside.  Patient is somewhat confused and appears tired -Status post thoracentesis and cultures growing gram-positive cocci -Complains of lower extremity swelling and leg cramps  REVIEW OF SYSTEMS:  Review of Systems  Constitutional: Positive for malaise/fatigue. Negative for chills and fever.  HENT: Negative for congestion, ear discharge, hearing loss and nosebleeds.   Eyes: Negative for blurred vision and double vision.  Respiratory: Negative for cough, shortness of breath and wheezing.   Cardiovascular: Positive for leg swelling. Negative for chest pain and palpitations.  Gastrointestinal: Negative for abdominal pain, constipation, diarrhea, nausea and vomiting.  Genitourinary: Negative for dysuria.  Musculoskeletal: Negative for myalgias.        restless legs  Neurological: Negative for dizziness, focal weakness, seizures, weakness and headaches.  Psychiatric/Behavioral: Negative for depression.    DRUG ALLERGIES:   Allergies  Allergen Reactions  . Penicillins Other (See Comments)    Has patient had a PCN reaction causing immediate rash, facial/tongue/throat swelling, SOB or lightheadedness with hypotension: Yes Has patient had a PCN reaction causing severe rash involving mucus membranes or skin necrosis: Unknown Has patient had a PCN reaction that required hospitalization: Unknown Has patient had a PCN reaction occurring within the last 10 years: Unknown If all of the above answers are "NO", then may proceed with Cephalosporin use.    VITALS:  Blood pressure (!) 113/96, pulse 65, temperature 98.4 F (36.9 C), temperature source Oral, resp. rate 18, height 5\' 2"  (1.575 m), weight 51.7 kg, SpO2 94 %.  PHYSICAL EXAMINATION:    Physical Exam   GENERAL:  67 y.o.-year-old ill nourished patient sitting in the bed with no acute distress.  EYES: Pupils equal, round, reactive to light and accommodation. No scleral icterus. Extraocular muscles intact.  HEENT: Head atraumatic, normocephalic. Oropharynx and nasopharynx clear.  NECK:  Supple, no jugular venous distention. No thyroid enlargement, no tenderness.  LUNGS: Normal breath sounds bilaterally, no wheezing, rales,rhonchi or crepitation. No use of accessory muscles of respiration.  Decreased bibasilar breath sounds CARDIOVASCULAR: S1, S2 normal. No murmurs, rubs, or gallops.  ABDOMEN: Soft, nontender, nondistended. Bowel sounds present. No organomegaly or mass.  EXTREMITIES: No  cyanosis, or clubbing.  2-3+ lower extremity edema especially in the feet, right greater than left. NEUROLOGIC: Cranial nerves II through XII are intact. Muscle strength 5/5 in all extremities. Sensation intact. Gait not checked.  No weakness noted PSYCHIATRIC: The patient is alert and oriented x 2.  SKIN: No obvious rash, lesion, or ulcer.    LABORATORY PANEL:   CBC Recent Labs  Lab 04/13/18 1030  WBC 11.1*  HGB 8.1*  HCT 27.3*  PLT 288   ------------------------------------------------------------------------------------------------------------------  Chemistries  Recent Labs  Lab 04/13/18 1030  NA 135  K 4.7  CL 101  CO2 26  GLUCOSE 199*  BUN 16  CREATININE 0.76  CALCIUM 7.8*  AST 24  ALT 20  ALKPHOS 197*  BILITOT 0.5   ------------------------------------------------------------------------------------------------------------------  Cardiac Enzymes Recent Labs  Lab 04/09/18 1446  TROPONINI 0.05*   ------------------------------------------------------------------------------------------------------------------  RADIOLOGY:  Ct Guided Needle Placement  Result Date: 04/12/2018 CLINICAL DATA:  Multiloculated right empyema EXAM: CT GUIDED ASPIRATION BIOPSY OF  RIGHT PLEURAL COLLECTION ANESTHESIA/SEDATION: Intravenous Fentanyl and Versed were administered as conscious  sedation during continuous monitoring of the patient's level of consciousness and physiological / cardiorespiratory status by the radiology RN, with a total moderate sedation time of 11 minutes. PROCEDURE: The procedure risks, benefits, and alternatives were explained to the patient. Questions regarding the procedure were encouraged and answered. The patient understands and consents to the procedure. Select axial scans through the thorax were obtained. The right dominant anterolateral collection was localized and an appropriate skin entry site was determined and marked. The operative field was prepped with chlorhexidinein a sterile fashion, and a sterile drape was applied covering the operative field. A sterile gown and sterile gloves were used for the procedure. Local anesthesia was provided with 1% Lidocaine. Under CT fluoroscopic guidance, 10 cm 5 French multi sidehole Yueh sheath needle advanced into the dominant anterolateral loculated right pleural collection. Approximately 76 mL of purulent material were aspirated, sample sent for Gram stain and culture. Postprocedure scan shows significant resolution of that component of her pleural process. No pneumothorax. The patient tolerated the procedure well. COMPLICATIONS: None immediate FINDINGS: Loculated right anterolateral pleural collection was localized corresponding to findings on previous CT 04/09/2018. 76 mL purulent material aspirated under CT guidance without complication. IMPRESSION: 1. Technically successful CT-guided aspiration of loculated right empyema, removing 76 mL purulent material, sent for Gram stain and culture. Electronically Signed   By: Lucrezia Europe M.D.   On: 04/12/2018 13:02   Ct Abdomen Pelvis W Contrast  Result Date: 04/11/2018 CLINICAL DATA:  67 year old female with upper abdominal pain off and on for the past year. Prior  hysterectomy. Subsequent encounter. EXAM: CT ABDOMEN AND PELVIS WITH CONTRAST TECHNIQUE: Multidetector CT imaging of the abdomen and pelvis was performed using the standard protocol following bolus administration of intravenous contrast. CONTRAST:  37mL OMNIPAQUE IOHEXOL 300 MG/ML  SOLN COMPARISON:  04/09/2018 chest CT. 11/22/2017 head CT. 11/14/2017 CT abdomen and pelvis. FINDINGS: Lower chest: Multiple right lung empyemas as noted on recent chest CT. Heart slightly enlarged. Coronary artery calcification. Mediastinal shift to the right. Hepatobiliary: Enlarged liver spanning over 21 cm. Slightly lobular contour raising possibility of cirrhosis. Left lobe liver 3.4 cm cyst as previously noted. No worrisome hepatic lesion identified. Contracted gallbladder with mild wall thickening which may be related to contracted state. Limited for evaluating for cholecystitis given the third spacing of fluid. No calcified gallstone. Pancreas: No worrisome pancreatic mass or inflammation. Spleen: No splenic mass or enlargement. Adrenals/Urinary Tract: No obstructing stone or hydronephrosis. No worrisome renal, adrenal or urinary bladder mass. Stomach/Bowel: Prominent stool right colon and proximal transverse colon. Evaluation for bowel inflammatory process slightly limited by mild third spacing of fluid. No discrete extraluminal bowel inflammatory process noted. Appendix not visualized, possibly removed at time of hysterectomy. Distal esophagus immediately adjacent empyema. Vascular/Lymphatic: Atherosclerotic changes aorta and aortic branch vessels. Infrarenal ectasia with aorta measuring up to 2.5 cm maximal transverse dimension. Prominent narrowing iliac arteries. Prominent narrowing origin inferior mesenteric artery. Scattered normal size lymph nodes. Reproductive: Post hysterectomy.  No worrisome adnexal mass. Other: Third spacing of fluid.  No free intraperitoneal air. Musculoskeletal: Chronic L1 anterior wedge compression  deformity with 80% loss of height anteriorly and 60% loss of height posteriorly with retropulsion posterior inferior aspect narrowing the ventral canal approaching the conus. Kyphosis centered at this level similar to 02/16/2018 chest CT. IMPRESSION: 1. Multiple right lung empyemas as noted on recent chest CT. 2. Third spacing of fluid. 3. Prominent stool right colon and proximal transverse colon. Evaluation for bowel inflammatory process slightly limited by  mild third spacing of fluid. No discrete extraluminal bowel inflammatory process noted. Appendix not visualized, possibly removed at time of hysterectomy. 4. Enlarged liver spanning over 21 cm. Slightly lobular contour raising possibility of cirrhosis. Left lobe liver 3.4 cm cyst as previously noted. No worrisome hepatic lesion identified. 5. Contracted gallbladder with mild wall thickening which may be related to contracted state. Limited for evaluating for cholecystitis given the third spacing of fluid. 6. Aortic Atherosclerosis (ICD10-I70.0). Atherosclerotic changes aorta and aortic branch vessels. Infrarenal ectasia with aorta measuring up to 2.5 cm maximal transverse dimension. Ectatic abdominal aorta at risk for aneurysm development. Recommend followup by ultrasound in 5 years. This recommendation follows ACR consensus guidelines: White Paper of the ACR Incidental Findings Committee II on Vascular Findings. J Am Coll Radiol 2013; 10:789-794. 7. Prominent narrowing iliac arteries. Prominent narrowing origin inferior mesenteric artery. 8. Mild cardiomegaly.  Coronary artery calcifications. 9. Chronic L1 anterior wedge compression fracture with retropulsion as detailed above. Electronically Signed   By: Genia Del M.D.   On: 04/11/2018 13:33    EKG:   Orders placed or performed during the hospital encounter of 04/09/18  . EKG 12-Lead  . EKG 12-Lead  . ED EKG within 10 minutes  . ED EKG within 10 minutes    ASSESSMENT AND PLAN:   67 year old  female with past medical history significant for depression anxiety, asthma, hypertension, osteoporosis admitted for recurrent right-sided pneumonia.  1.  Recurrent right-sided pneumonia with possible empyema-started on IV cefepime. -Was treated almost for the ER with steroids and antibiotics with no improvement.  Worsening noted on CT scan this admission -Status post IR guided drainage of the pleural fluid. -If her pneumonia is treatable, decortication is advised.  Cardiothoracic surgery following -We will have infectious disease consult today  2.  Acute metabolic encephalopathy-discontinue Effexor and BuSpar as patient not taking at home according to sister as they were causing her to be confused. -Continue to monitor.  No focal deficits.  Check ammonia level  3.  Restless legs-potassium is within normal limits.  Ordered Requip  4.  Anemia of chronic disease-stable hemoglobin.  No indication for transfusion  5.  Lower extremity edema-no known congestive heart failure.  Dopplers negative for DVT.  Albumin is significantly low.  Will give IV albumin infusion -Urine analysis for proteinuria  6.  DVT prophylaxis-encourage ambulation.  Also on subcutaneous heparin. Physical therapy consulted   Sister updated at bedside  All the records are reviewed and case discussed with Care Management/Social Workerr. Management plans discussed with the patient, family and they are in agreement.  CODE STATUS: DNR  TOTAL TIME TAKING CARE OF THIS PATIENT: 38 minutes.   POSSIBLE D/C IN 2-3 DAYS, DEPENDING ON CLINICAL CONDITION.   Gladstone Lighter M.D on 04/13/2018 at 11:16 AM  Between 7am to 6pm - Pager - [phone]  After 6pm go to [email or web address] - password [email or web address]  Office  [phone]  CC: Primary care physician; Glendon Axe, MD

## 2018-04-13 NOTE — Care Management Important Message (Signed)
Copy of signed IM left with patient in room.  

## 2018-04-13 NOTE — Progress Notes (Signed)
  Patient ID: Wendy Arias, female   DOB: 03/01/1951, 67 y.o.   MRN: [id number]  HISTORY: No real complaints this morning.  She does appear more tired than before.  She tolerated her thoracentesis well.   Vitals:   04/13/18 0733 04/13/18 0813  BP: (!) 113/96   Pulse: 65   Resp:    Temp: 98.4 F (36.9 C)   SpO2: 98% 94%     EXAM:    Resp: Lungs are decreased bilaterally.  There are rhonchi present bilaterally.  No respiratory distress, normal effort. Heart:  Regular without murmurs Abd:  Abdomen is soft, non distended and non tender. No masses are palpable.  There is no rebound and no guarding.  Neurological: Alert and oriented to person, place, and time. Coordination normal.  Skin: Skin is warm and dry. No rash noted. No diaphoretic. No erythema. No pallor.  Psychiatric: Normal mood and affect. Normal behavior. Judgment and thought content normal.    ASSESSMENT: Review of the CT-guided fluid demonstrates gram-positive cocci and a purulent pleural effusion.   PLAN:   At this point we need to determine whether or not her underlying pneumonia is treatable.  If so then we can perform a decortication.  If for whatever reason the underlying pneumonia with its lung disease is not treatable at this institution then she will require transfer to another institution.    Nestor Lewandowsky, MDPatient ID: Wendy Arias, female   DOB: August 28, 1950, 67 y.o.   MRN: [id number]

## 2018-04-13 NOTE — Progress Notes (Signed)
Follow-up requested with regards to empyema/lung abscess. Follow-up requested by Dr. Tressia Miners.  Subjective:    Patient ID: Wendy Arias, female    DOB: 1951-04-29, 67 y.o.   MRN: [id number]  HPI the patient is a 67 year old female very former smoker, who has had several months of progressive decline with pulmonary infiltrates and now with a complex highly loculatedright-sided empyema. The patient has been followed by Dr. Laverle Hobby at [facility]. He has had extensive workup to include bronchoscopy with right middle lobe and right lower lobe biopsies as well as in the bronchial ultrasound for lymph node sampling and evaluation of mediastinal adenopathy which was reactive. There were no end of bronchial lesions noted at the time of bronchoscopy and the biopsies have been consistent with inflammatory/necrotic debris. At the time of the initial bronchoscopy performed in June 2019 bronchoalveolar lavage revealed Viridans Streptococcus. He has received prolonged use of antibiotics and steroids with no improvement. The patient has continued to decline. Patient is a very poor historian due to issues with memory. Family is not present for me to corroborate history. She does not appear to be in any distress but does state that she does have chest pain on the right. Review of her records shown that she has had significant failure to thrive during this illness and has had weight loss and progressive status decline with increased confusion.  Review of Systems  Constitutional: Positive for unexpected weight change.  HENT: Negative.   Eyes: Negative.   Respiratory: Positive for shortness of breath.   Cardiovascular: Positive for chest pain.  Gastrointestinal: Negative.   Psychiatric/Behavioral: Positive for confusion.  All other systems reviewed and are negative.      Objective:   Physical Exam  Constitutional: She is oriented to person, place, and time. She appears well-developed. She  appears ill (Chronically ill appearing).  HENT:  Head: Normocephalic.  Mouth/Throat: Oropharynx is clear and moist. No oropharyngeal exudate (Poor dentition).  Eyes: Pupils are equal, round, and reactive to light. EOM are normal.  Neck: Neck supple. No JVD present. No tracheal deviation present. No thyromegaly present.  Cardiovascular: Normal rate, regular rhythm, normal heart sounds and intact distal pulses.  Pulmonary/Chest: No respiratory distress. She has decreased breath sounds in the right middle field and the right lower field. She has no wheezes. She has no rhonchi. She has no rales.  Abdominal: Soft. Bowel sounds are normal. She exhibits no distension.  Lymphadenopathy:    She has no cervical adenopathy.  Neurological: She is alert and oriented to person, place, and time.  She is quite befuddled but can be reoriented. Confabulates.  Skin: Skin is warm and dry. No rash noted. No erythema.  Psychiatric: She has a normal mood and affect.   I have reviewed available laboratory, pathology and radiologic data. As recent pleural fluid sampling has also been reviewed.      Assessment & Plan:   1) Empyema with multiple loculations, the patient would require decortication at a minimum. Unfortunately this would be a major intervention as VATS is not possible due to the number of loculations and pleural thickening. In addition it appears that she has an necrotic process on the right lower lobe that has failed to respond to antibiotics and this may require lobectomy as well. I have discussed the case with Dr. Nestor Lewandowsky and with Dr. Tressia Miners. The patient's options are limited. She may benefit from second opinion evaluation at [facility]   2) Progressive physical decline/failure to thrive, this is  in relation to her infectious/inflammatory process.  3) Rheumatoid factor positivity of unknown significance but may be playing a role as associated vasculitic process will hinder healing.ANCA panel  negative. Query immunodeficiency such as common variable immunodeficiency. She is HIV negative.  4) Confusion aggravated by her inflammatory process. Query early dementia.  5) Overall prognosis is guarded due to multiple issues particularly issues with failure to thrive and unresolved inflammatory/infectious process.   As per discussion with Dr. Tressia Miners and happy to meet with the patient's family tomorrow if they wish.

## 2018-04-13 NOTE — Consult Note (Addendum)
NAME: Wendy Arias  DOB: September 22, 1950  MRN: [id number]  Date/Time: 04/13/2018 1:36 PM  Wendy Arias Subjective:  REASON FOR CONSULT: Empyema ? Wendy Arias is a 67 y.o. female with a history of smoking tobacco, COPD, anxiety, hypertension, right lung infiltrate presents from home because of swelling of legs, cough and shortness of breath. Patient is a vague historian.  Son is able to fill in.  Chart reviewed. As per collective information the patient has had a cough for at least a year.  She is a smoker for many years and has been smoking until 2 weeks ago.  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  May 2019 patient presented to GI physician with abdominal pain of unclear etiology and also bloating with 15 pound weight gain with early satiety.  So CT scan was ordered to look at the abdomen and that showed abnormal finding in the right lung so a CT chest was ordered and it revealed a 5.5 into 4.2 cm right lower lobe mass and 4.4 into 4.1 cm right middle lobe lesion which was necrotic.    She saw her pulmonologist Dr. Ashby Dawes as outpatient and he he did Eberst bronchoscopy with EVIS guided lymph node biopsy of the right paratracheal and subcarinal lymph nodes as well as transbronchial fluoroscopy guided biopsies of the right lower lobe mass on 11/28/2017.  The pathology and cytology came back as acute inflammation with no evidence of malignancy.  The plan was to do a CT-guided biopsy.  That was not done because of improvement in the size of the lesion.  So when she saw Dr. Ashby Dawes on 12/22/2017 he gave her a 4-week course of steroids for possible Boop. 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   CT showed Increasing mass-like area of probable airspace consolidation in the lateral segment of the right  middle lobe, within which there is a potential area of intrapulmonary abscess which appears to extend to the pleural surface. Given the presence of what appears to be a very small but complex and loculated right-sided pleural effusion, the possibility of developing bronchopleural fistula should be considered. 3. New likely subacute L1 vertebral body compression fracture with 80% loss of anterior vertebral body height.  She was seen by Thoracic surgeon and pulmonary.  No intervention was planned. she was sent home on clindamycin for nearly 3 weeks. She saw Dr. Ashby Dawes as outpatient on March 10, 2018 and it was noted that she was continuing to have chest congestion and cough.  The plan was to do CT-guided biopsy to see the cause for this mass as there was no improvement with steroids or antibiotics. In preparation for the biopsy regulation panel was checked and was found to have a INR of 1.7 on 03/22/2018.  She was prescribed vitamin K 100 mg daily for a week.  She is brought to the hospital on 04/09/2018 because of increasing swelling of her legs, cough shortness of breath and confusion.  As per the son the family had noted for the past 8 months that she has been confused and has been hallucinating intermittently.  She used to live on her own.  But now she stays with her sister as well as her other son. She was also admitted in the first week of May for change in mental status  which was thought to be due to benzodiazepine withdrawal.  She Had been taking Xanax 4 times daily for the past 20 years. CT of the abdomen which was done in May had shown nodular enlarged liver raising suspicion for cirrhosis. Patient denied alcohol abuse.  Patient did not visit [place] or [place] or area of cocci.  Not have any birds at home. She does not have any outdoor activities.  She used to live in [place] until 2000.  After that she has been in [place]. She does not swim, hunt or  fish. Patient states she can sometimes choke on meat but that has not gotten worse.  She has not had any recent tooth extraction. She has been smoking until 2 weeks ago.  Past Medical History:  Diagnosis Date  . Anxiety   . Asthma   . Depression   . Hypertension   . Myocardial infarction ([place])   . Osteoporosis     Past Surgical History:  Procedure Laterality Date  . ABDOMINAL HYSTERECTOMY     partial  . CARDIAC CATHETERIZATION    . CARDIAC SURGERY    . COLONOSCOPY WITH PROPOFOL N/A 04/12/2017   Procedure: COLONOSCOPY WITH PROPOFOL;  Surgeon: Lollie Sails, MD;  Location: [facility] ENDOSCOPY;  Service: Endoscopy;  Laterality: N/A;  . CORONARY ANGIOPLASTY    . ENDOBRONCHIAL ULTRASOUND N/A 11/28/2017   Procedure: ENDOBRONCHIAL ULTRASOUND;  Surgeon: Laverle Hobby, MD;  Location: [facility] ORS;  Service: Pulmonary;  Laterality: N/A;  . REDUCTION MAMMAPLASTY      Social history Retired Marine scientist Smoker more than 40 pack years Occasional alcohol in the past No illicit drug use. Family History  Problem Relation Age of Onset  . Breast cancer Mother 59  . Hypertension Mother   . Dementia Mother   . Breast cancer Maternal Aunt 70  . Breast cancer Other 35  . Hypertension Father   . Heart disease Father   . Diabetes Father   . Diabetes Sister    Allergies  Allergen Reactions  . Penicillins Other (See Comments)    Has patient had a PCN reaction causing immediate rash, facial/tongue/throat swelling, SOB or lightheadedness with hypotension: Yes Has patient had a PCN reaction causing severe rash involving mucus membranes or skin necrosis: Unknown Has patient had a PCN reaction that required hospitalization: Unknown Has patient had a PCN reaction occurring within the last 10 years: Unknown If all of the above answers are "NO", then may proceed with Cephalosporin use.    ? Current Facility-Administered Medications  Medication Dose Route Frequency Provider Last Rate Last Dose  .  albumin human 25 % solution 25 g  25 g Intravenous Q12H Gladstone Lighter, MD 60 mL/hr at 04/13/18 1213 25 g at 04/13/18 1213  . albuterol (PROVENTIL) (2.5 MG/3ML) 0.083% nebulizer solution 2.5 mg  2.5 mg Nebulization Q6H Fritzi Mandes, MD   2.5 mg at 04/13/18 0813  . ALPRAZolam (XANAX) tablet 1 mg  1 mg Oral TID PRN Vaughan Basta, MD      . ceFEPIme (MAXIPIME) 2 g in sodium chloride 0.9 % 100 mL IVPB  2 g Intravenous Q12H Vaughan Basta, MD   Stopped at 04/13/18 [phone]  . dicyclomine (BENTYL) capsule 10 mg  10 mg Oral Q6H PRN Vaughan Basta, MD      . diphenhydrAMINE (BENADRYL) capsule 25 mg  25 mg Oral Q8H PRN Fritzi Mandes, MD   25 mg at 04/11/18 1613  . docusate sodium (COLACE) capsule 100 mg  100 mg Oral  BID PRN Vaughan Basta, MD      . guaiFENesin-codeine 100-10 MG/5ML solution 5 mL  5 mL Oral Q6H Fritzi Mandes, MD   5 mL at 04/13/18 0935  . heparin injection 5,000 Units  5,000 Units Subcutaneous Q8H Vaughan Basta, MD   5,000 Units at 04/12/18 2113  . HYDROcodone-acetaminophen (NORCO/VICODIN) 5-325 MG per tablet 1-2 tablet  1-2 tablet Oral Q6H PRN Vaughan Basta, MD   2 tablet at 04/13/18 0459  . hydrOXYzine (ATARAX/VISTARIL) tablet 25-100 mg  25-100 mg Oral PRN Vaughan Basta, MD      . ibuprofen (ADVIL,MOTRIN) tablet 400 mg  400 mg Oral Q6H PRN Lance Coon, MD   400 mg at 04/13/18 0903  . metoprolol succinate (TOPROL-XL) 24 hr tablet 50 mg  50 mg Oral Daily Vaughan Basta, MD   50 mg at 04/13/18 0935  . rOPINIRole (REQUIP) tablet 0.25 mg  0.25 mg Oral BID Gladstone Lighter, MD   0.25 mg at 04/13/18 1051  . simvastatin (ZOCOR) tablet 40 mg  40 mg Oral Daily Vaughan Basta, MD   40 mg at 04/13/18 0935  . tiotropium (SPIRIVA) inhalation capsule ([facility] use ONLY) 18 mcg  18 mcg Inhalation Daily Fritzi Mandes, MD   18 mcg at 04/13/18 1051  . vitamin B-12 (CYANOCOBALAMIN) tablet 1,000 mcg  1,000 mcg Oral Daily Vaughan Basta, MD   1,000 mcg at 04/13/18 0935     Abtx:  Anti-infectives (From admission, onward)   Start     Dose/Rate Route Frequency Ordered Stop   04/10/18 0900  vancomycin (VANCOCIN) IVPB 750 mg/150 ml premix  Status:  Discontinued     750 mg 150 mL/hr over 60 Minutes Intravenous Every 18 hours 04/09/18 1720 04/10/18 1405   04/10/18 0600  ceFEPIme (MAXIPIME) 2 g in sodium chloride 0.9 % 100 mL IVPB     2 g 200 mL/hr over 30 Minutes Intravenous Every 12 hours 04/09/18 1720     04/09/18 1700  vancomycin (VANCOCIN) IVPB 1000 mg/200 mL premix     1,000 mg 200 mL/hr over 60 Minutes Intravenous  Once 04/09/18 1625 04/09/18 1816   04/09/18 1630  ceFEPIme (MAXIPIME) 1 g in sodium chloride 0.9 % 100 mL IVPB     1 g 200 mL/hr over 30 Minutes Intravenous  Once 04/09/18 1617 04/09/18 1742      REVIEW OF SYSTEMS:  Const: negative fever, negative chills, night sweats and 20 pound weight loss.   Eyes: negative diplopia or visual changes, negative eye pain ENT: negative coryza, negative sore throat Resp: cough, dates she can bring up peachy colored sputum at times .no frank hemoptysis, has dyspnea Cards: Has right-sided chest pain, palpitations, lower extremity edema GU: negative for frequency, dysuria and hematuria GI: Has pain in the epigastric area.  She takes Advil PM pretty much every night skin: negative for rash and pruritus Heme: negative for easy bruising and gum/nose bleeding MS: Fatigue and weakness Neurolo: Confusion, hallucination intermittently as per family.  Patient does recognize that she may be confused at times.  She attributes this to her shifts at work before.  She used to work most of the time night shift  psych: Anxiety and depression Endocrine: Polyuria or polydipsia  allergy/Immunology-an allergy as a child by her mother.  She has taken Augmentin without any issues.  ?  Objective:  VITALS:  BP (!) 164/86   Pulse 66   Temp 97.9 F (36.6 C) (Oral)   Resp 16   Ht  5' 2"  (  1.575 m)   Wt 51.7 kg   SpO2 98%   BMI 20.85 kg/m  PHYSICAL EXAM:  General: Pale alert, has a strange affect cooperative, no distress, oriented in person, place, year, month and day Head: Normocephalic, without obvious abnormality, atraumatic. Eyes: Conjunctivae clear, anicteric sclerae. Pupils are equal ENT Nares normal. No drainage or sinus tenderness. Lips, mucosa, and tongue normal. No Thrush Absent upper teeth  Neck: Supple, symmetrical, no adenopathy, thyroid: non tender no carotid bruit and no JVD. Back: No CVA tenderness. Lungs: Bilateral air entry.  Decreased right side more than left the base Heart: Regular rate and rhythm, no murmur, rub or gallop. Abdomen: Soft, nodularity felt over the epigastric area  extremities: Edematous up to the midshin Skin: Pinpoint excoriation over the right leg  lymph: Cervical, supraclavicular normal. Neurologic: Grossly non-focal Pertinent Labs   CBC Latest Ref Rng & Units 04/13/2018 04/11/2018 04/10/2018  WBC 4.0 - 10.5 K/uL 11.1(H) - 13.0(H)  Hemoglobin 12.0 - 15.0 g/dL 8.1(L) 7.7(L) 7.7(L)  Hematocrit 36.0 - 46.0 % 27.3(L) - 25.4(L)  Platelets 150 - 400 K/uL 288 - 299   CMP Latest Ref Rng & Units 04/13/2018 04/10/2018 04/09/2018  Glucose 70 - 99 mg/dL 199(H) 84 128(H)  BUN 8 - 23 mg/dL 16 11 13   Creatinine 0.44 - 1.00 mg/dL 0.76 0.71 0.79  Sodium 135 - 145 mmol/L 135 134(L) 134(L)  Potassium 3.5 - 5.1 mmol/L 4.7 3.7 3.8  Chloride 98 - 111 mmol/L 101 100 98  CO2 22 - 32 mmol/L 26 26 25   Calcium 8.9 - 10.3 mg/dL 7.8(L) 7.7(L) 7.9(L)  Total Protein 6.5 - 8.1 g/dL 5.9(L) - -  Total Bilirubin 0.3 - 1.2 mg/dL 0.5 - -  Alkaline Phos 38 - 126 U/L 197(H) - -  AST 15 - 41 U/L 24 - -  ALT 0 - 44 U/L 20 - -   Pleural fluid LDH>10,000 Glucose<20   IMAGING RESULTS: Intervally developed empyema along the lateral pleural surface of the RIGHT lower lung, measuring at least 10 cm craniocaudal extent with AP measurement of  approximately 7 cm and thickness of approximately 3 cm.  Multiple additional empyemas along the posterior pleural surface of the RIGHT lower lung, multiloculated configuration, with measurements ranging from 2.5-5 cm greatest dimension, with intervening dense consolidations.  ?  Aug 2019  May 2019   Impression/Recommendation ?67 y.o. female with a history of smoking tobacco, COPD, anxiety, hypertension, right lung infiltrate presents from home because of swelling of legs, cough and shortness of breath.  Progressing nodularity of the right lung since May 2019.  Now has multiple loculated empyema.  She had taken a course of clindamycin in August with no improvement.  what is the reason for this necrotizing pneumonia along with empyema?  Is this aspiration or is it underlying malignancy.  Or is it atypical infection like atypical mycobacteria or fungus.  Actinomycosis/Nocardia remote possibility . The LDH of the pleural fluid is more than 10,000 which indicates a bacterial infection rather than fungal or atypical mycobacteria. Will ask lab to do fungal and afb culture( for MAC or other atypical mycobacteria)  on the pleural fluid ( not suspecting TB)  She is going to need a video-assisted thoracoscopy procedure for debridement and  Decortication  as well as for tissue diagnosis from the lung and pleura as she has not clearly responded to previous interventions including EBUS/biopsy steroids and antibiotics and we do not have an answer yet .  Currently on cefepime.  Unasyn  will be a better antibiotic.  She has taken Augmentin in the past and has tolerated it.  PCN allergy she does not know what type of allergy she had.  Was told by her mother as a child that she had an allergy.  Most of the time is not a true penicillin allergy.  As she has tolerated Augmentin in the past will change cefepime to Unasyn. Discussed with her and her son about this and they are willing to change the  antibiotic.  Anemia since the past few months.  She has been taking Advil every day and also has epigastric pain.  Rule out gastritis.  Recommend checking B12 and folate.  Nodularity of the liver found on CT scan suggestive of cirrhosis. She denies alcohol use.  Recommend checking the hepatitis profile.  Confusion and hallucination intermittently as per family for the past 8 months.   ?Medication versus paraneoplastic versus early cognitive impairment versus liver related.  HIV negative   Osteoporosis with L1 compression fracture ___________________________________________________ Discussed with patient and her son

## 2018-04-14 ENCOUNTER — Inpatient Hospital Stay: Payer: PPO

## 2018-04-14 DIAGNOSIS — J869 Pyothorax without fistula: Secondary | ICD-10-CM | POA: Diagnosis not present

## 2018-04-14 DIAGNOSIS — I5033 Acute on chronic diastolic (congestive) heart failure: Secondary | ICD-10-CM | POA: Diagnosis not present

## 2018-04-14 DIAGNOSIS — Z9189 Other specified personal risk factors, not elsewhere classified: Secondary | ICD-10-CM | POA: Diagnosis not present

## 2018-04-14 DIAGNOSIS — T17908D Unspecified foreign body in respiratory tract, part unspecified causing other injury, subsequent encounter: Secondary | ICD-10-CM | POA: Diagnosis not present

## 2018-04-14 DIAGNOSIS — G8929 Other chronic pain: Secondary | ICD-10-CM | POA: Diagnosis not present

## 2018-04-14 DIAGNOSIS — J939 Pneumothorax, unspecified: Secondary | ICD-10-CM | POA: Diagnosis not present

## 2018-04-14 DIAGNOSIS — J851 Abscess of lung with pneumonia: Secondary | ICD-10-CM | POA: Diagnosis not present

## 2018-04-14 DIAGNOSIS — F329 Major depressive disorder, single episode, unspecified: Secondary | ICD-10-CM | POA: Diagnosis not present

## 2018-04-14 DIAGNOSIS — R918 Other nonspecific abnormal finding of lung field: Secondary | ICD-10-CM | POA: Diagnosis not present

## 2018-04-14 DIAGNOSIS — J9811 Atelectasis: Secondary | ICD-10-CM | POA: Diagnosis not present

## 2018-04-14 DIAGNOSIS — I340 Nonrheumatic mitral (valve) insufficiency: Secondary | ICD-10-CM | POA: Diagnosis not present

## 2018-04-14 DIAGNOSIS — E440 Moderate protein-calorie malnutrition: Secondary | ICD-10-CM | POA: Diagnosis not present

## 2018-04-14 DIAGNOSIS — I361 Nonrheumatic tricuspid (valve) insufficiency: Secondary | ICD-10-CM | POA: Diagnosis not present

## 2018-04-14 DIAGNOSIS — I2781 Cor pulmonale (chronic): Secondary | ICD-10-CM | POA: Diagnosis not present

## 2018-04-14 DIAGNOSIS — I10 Essential (primary) hypertension: Secondary | ICD-10-CM | POA: Diagnosis not present

## 2018-04-14 DIAGNOSIS — J948 Other specified pleural conditions: Secondary | ICD-10-CM | POA: Diagnosis not present

## 2018-04-14 DIAGNOSIS — R110 Nausea: Secondary | ICD-10-CM | POA: Diagnosis not present

## 2018-04-14 DIAGNOSIS — I081 Rheumatic disorders of both mitral and tricuspid valves: Secondary | ICD-10-CM | POA: Diagnosis not present

## 2018-04-14 DIAGNOSIS — R1312 Dysphagia, oropharyngeal phase: Secondary | ICD-10-CM | POA: Diagnosis not present

## 2018-04-14 DIAGNOSIS — A491 Streptococcal infection, unspecified site: Secondary | ICD-10-CM | POA: Diagnosis not present

## 2018-04-14 DIAGNOSIS — I5023 Acute on chronic systolic (congestive) heart failure: Secondary | ICD-10-CM | POA: Diagnosis not present

## 2018-04-14 DIAGNOSIS — F411 Generalized anxiety disorder: Secondary | ICD-10-CM | POA: Diagnosis not present

## 2018-04-14 DIAGNOSIS — G8918 Other acute postprocedural pain: Secondary | ICD-10-CM | POA: Diagnosis not present

## 2018-04-14 DIAGNOSIS — K219 Gastro-esophageal reflux disease without esophagitis: Secondary | ICD-10-CM | POA: Diagnosis not present

## 2018-04-14 DIAGNOSIS — M810 Age-related osteoporosis without current pathological fracture: Secondary | ICD-10-CM | POA: Diagnosis not present

## 2018-04-14 DIAGNOSIS — J811 Chronic pulmonary edema: Secondary | ICD-10-CM | POA: Diagnosis not present

## 2018-04-14 DIAGNOSIS — I5032 Chronic diastolic (congestive) heart failure: Secondary | ICD-10-CM | POA: Diagnosis not present

## 2018-04-14 DIAGNOSIS — D509 Iron deficiency anemia, unspecified: Secondary | ICD-10-CM | POA: Diagnosis not present

## 2018-04-14 DIAGNOSIS — I5082 Biventricular heart failure: Secondary | ICD-10-CM | POA: Diagnosis not present

## 2018-04-14 DIAGNOSIS — B954 Other streptococcus as the cause of diseases classified elsewhere: Secondary | ICD-10-CM | POA: Diagnosis not present

## 2018-04-14 DIAGNOSIS — I252 Old myocardial infarction: Secondary | ICD-10-CM | POA: Diagnosis not present

## 2018-04-14 DIAGNOSIS — Z955 Presence of coronary angioplasty implant and graft: Secondary | ICD-10-CM | POA: Diagnosis not present

## 2018-04-14 DIAGNOSIS — Z4682 Encounter for fitting and adjustment of non-vascular catheter: Secondary | ICD-10-CM | POA: Diagnosis not present

## 2018-04-14 DIAGNOSIS — J90 Pleural effusion, not elsewhere classified: Secondary | ICD-10-CM | POA: Diagnosis not present

## 2018-04-14 DIAGNOSIS — E785 Hyperlipidemia, unspecified: Secondary | ICD-10-CM | POA: Diagnosis not present

## 2018-04-14 DIAGNOSIS — I110 Hypertensive heart disease with heart failure: Secondary | ICD-10-CM | POA: Diagnosis not present

## 2018-04-14 DIAGNOSIS — R4182 Altered mental status, unspecified: Secondary | ICD-10-CM | POA: Diagnosis not present

## 2018-04-14 DIAGNOSIS — G4700 Insomnia, unspecified: Secondary | ICD-10-CM | POA: Diagnosis not present

## 2018-04-14 DIAGNOSIS — K7460 Unspecified cirrhosis of liver: Secondary | ICD-10-CM | POA: Diagnosis not present

## 2018-04-14 DIAGNOSIS — Z87891 Personal history of nicotine dependence: Secondary | ICD-10-CM | POA: Diagnosis not present

## 2018-04-14 DIAGNOSIS — R0609 Other forms of dyspnea: Secondary | ICD-10-CM | POA: Diagnosis not present

## 2018-04-14 DIAGNOSIS — M25511 Pain in right shoulder: Secondary | ICD-10-CM | POA: Diagnosis not present

## 2018-04-14 DIAGNOSIS — R410 Disorientation, unspecified: Secondary | ICD-10-CM | POA: Diagnosis not present

## 2018-04-14 DIAGNOSIS — I2510 Atherosclerotic heart disease of native coronary artery without angina pectoris: Secondary | ICD-10-CM | POA: Diagnosis not present

## 2018-04-14 DIAGNOSIS — J449 Chronic obstructive pulmonary disease, unspecified: Secondary | ICD-10-CM | POA: Diagnosis not present

## 2018-04-14 LAB — CULTURE, BLOOD (ROUTINE X 2)
Culture: NO GROWTH
Culture: NO GROWTH

## 2018-04-14 LAB — FOLATE: Folate: 4.9 ng/mL — ABNORMAL LOW (ref >5.9)

## 2018-04-14 LAB — VITAMIN B12: Vitamin B-12: 2626 pg/mL — ABNORMAL HIGH (ref 180–914)

## 2018-04-14 MED ORDER — SODIUM CHLORIDE 0.9 % IV SOLN: 3.0000 g | Freq: Four times a day (QID) | INTRAVENOUS | Status: DC

## 2018-04-14 MED ORDER — METRONIDAZOLE IN NACL 5-0.79 MG/ML-% IV SOLN: 500.0000 mg | Freq: Three times a day (TID) | INTRAVENOUS | Status: DC

## 2018-04-14 MED ORDER — SODIUM CHLORIDE 0.9 % IV SOLN
2.0000 g | INTRAVENOUS | Status: DC
  Administered 2018-04-14: 2 g via INTRAVENOUS
  Filled 2018-04-14: qty 2

## 2018-04-14 MED ORDER — DOCUSATE SODIUM 100 MG PO CAPS: 100.0000 mg | ORAL_CAPSULE | Freq: Two times a day (BID) | ORAL | 0 refills | Status: DC | PRN

## 2018-04-14 MED ORDER — METRONIDAZOLE IN NACL 5-0.79 MG/ML-% IV SOLN
500.0000 mg | Freq: Three times a day (TID) | INTRAVENOUS | Status: DC
  Administered 2018-04-14: 500 mg via INTRAVENOUS
  Filled 2018-04-14 (×4): qty 100

## 2018-04-14 MED ORDER — METOPROLOL SUCCINATE ER 50 MG PO TB24: 50.0000 mg | ORAL_TABLET | Freq: Every day | ORAL | Status: DC

## 2018-04-14 MED ORDER — SODIUM CHLORIDE 0.9 % IV SOLN: 2.0000 g | INTRAVENOUS | Status: DC

## 2018-04-14 NOTE — Progress Notes (Signed)
Wendy Arias is a 67 y.o. female with a history of smoking tobacco, COPD, anxiety, hypertension, right lung infiltrate presents from home because of swelling of legs, cough and shortness of breath. Patient is a vague historian.  Son is able to fill in.  Chart reviewed. As per collective information the patient has had a cough for at least a year.  She is a smoker for many years and has been smoking until 2 weeks ago.  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  May 2019 patient presented to GI physician with abdominal pain of unclear etiology and also bloating with 15 pound weight gain with early satiety.  So CT scan was ordered to look at the abdomen and that showed abnormal finding in the right lung so a CT chest was ordered and it revealed a 5.5 into 4.2 cm right lower lobe mass and 4.4 into 4.1 cm right middle lobe lesion which was necrotic.    She saw her pulmonologist Dr. Ashby Dawes as outpatient and he he did Eberst bronchoscopy with EVIS guided lymph node biopsy of the right paratracheal and subcarinal lymph nodes as well as transbronchial fluoroscopy guided biopsies of the right lower lobe mass on 11/28/2017.  The pathology and cytology came back as acute inflammation with no evidence of malignancy.  The plan was to do a CT-guided biopsy.  That was not done because of improvement in the size of the lesion.  So when she saw Dr. Ashby Dawes on 12/22/2017 he gave her a 4-week course of steroids for possible Boop. 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   CT showed Increasing mass-like area of probable airspace consolidation in the lateral segment of the right middle lobe, within which there is a potential area of intrapulmonary abscess which appears to extend to the pleural surface. Given the presence  of what appears to be a very small but complex and loculated right-sided pleural effusion, the possibility of developing bronchopleural fistula should be considered. 3. New likely subacute L1 vertebral body compression fracture with 80% loss of anterior vertebral body height.  She was seen by Thoracic surgeon and pulmonary.  No intervention was planned. she was sent home on clindamycin for nearly 3 weeks. She saw Dr. Ashby Dawes as outpatient on March 10, 2018 and it was noted that she was continuing to have chest congestion and cough.  The plan was to do CT-guided biopsy to see the cause for this mass as there was no improvement with steroids or antibiotics. In preparation for the biopsy regulation panel was checked and was found to have a INR of 1.7 on 03/22/2018.  She was prescribed vitamin K 100 mg daily for a week.  She is brought to the hospital on 04/09/2018 because of increasing swelling of her legs, cough shortness of breath and confusion.  As per the son the family had noted for the past 8 months that she has been confused and has been hallucinating intermittently.  She used to live on her own.  But now she stays with her sister as well as her other son. She was also admitted in the first week of May for change in mental status which was thought to be due to benzodiazepine withdrawal.  She Had been taking Xanax 4 times daily for the past 20 years. CT  of the abdomen which was done in May had shown nodular enlarged liver raising suspicion for cirrhosis. Patient denied alcohol abuse.  Patient did not visit [place] or [place] or area of cocci.  Not have any birds at home. She does not have any outdoor activities.  She used to live in [place] until 2000.  After that she has been in [place]. She does not swim, hunt or fish. Patient states she can sometimes choke on meat but that has not gotten worse.  She has not had any recent tooth extraction. She has been  smoking until 2 weeks ago.   Subjective Restless Anxious Received a dsoe of benadryl for some itch on her left arm  Objective:  VITALS:  Patient Vitals for the past 24 hrs:  BP Temp Temp src Pulse Resp SpO2  04/14/18 1129 (!) 155/99 - - 74 - 100 %  04/14/18 1018 (!) 129/95 - - 75 - 94 %  04/14/18 0802 - - - 77 - 100 %  04/14/18 0748 (!) 150/95 - - 68 - -  04/14/18 0744 (!) 187/87 - - 70 - 98 %  04/14/18 0443 130/79 97.8 F (36.6 C) Oral 69 20 96 %  04/13/18 2252 134/86 - - 70 (!) 32 100 %  04/13/18 2001 - - - - - 94 %  04/13/18 1952 (!) 130/92 97.7 F (36.5 C) Oral 64 20 98 %  04/13/18 1400 127/89 - - 62 - -  04/13/18 1351 - - - - - 97 %  04/13/18 1336 (!) 164/86 97.9 F (36.6 C) Oral 66 16 98 %   PHYSICAL EXAM:  General: Pale alert, has a strange affect restless. Some SOB hallucinating Head: Normocephalic, without obvious abnormality, atraumatic. Eyes: Conjunctivae clear, anicteric sclerae. Pupils are equal ENT Nares normal. No drainage or sinus tenderness. Lips, mucosa, and tongue normal. No Thrush Absent upper teeth  Neck: Supple, symmetrical, no adenopathy, thyroid: non tender no carotid bruit and no JVD. Back: No CVA tenderness. Lungs: Bilateral air entry.  Decreased right side more than left the base Heart: Regular rate and rhythm, no murmur, rub or gallop. Abdomen: Soft, nodularity felt over the epigastric area  extremities: Edematous up to the midshin Skin: Pinpoint excoriation over the right leg  lymph: Cervical, supraclavicular normal. Neurologic: Grossly non-focal Pertinent Labs   CBC Latest Ref Rng & Units 04/13/2018 04/11/2018 04/10/2018  WBC 4.0 - 10.5 K/uL 11.1(H) - 13.0(H)  Hemoglobin 12.0 - 15.0 g/dL 8.1(L) 7.7(L) 7.7(L)  Hematocrit 36.0 - 46.0 % 27.3(L) - 25.4(L)  Platelets 150 - 400 K/uL 288 - 299   CMP Latest Ref Rng & Units 04/13/2018 04/10/2018 04/09/2018  Glucose 70 - 99 mg/dL 199(H) 84 128(H)  BUN 8 - 23 mg/dL 16 11 13   Creatinine  0.44 - 1.00 mg/dL 0.76 0.71 0.79  Sodium 135 - 145 mmol/L 135 134(L) 134(L)  Potassium 3.5 - 5.1 mmol/L 4.7 3.7 3.8  Chloride 98 - 111 mmol/L 101 100 98  CO2 22 - 32 mmol/L 26 26 25   Calcium 8.9 - 10.3 mg/dL 7.8(L) 7.7(L) 7.9(L)  Total Protein 6.5 - 8.1 g/dL 5.9(L) - -  Total Bilirubin 0.3 - 1.2 mg/dL 0.5 - -  Alkaline Phos 38 - 126 U/L 197(H) - -  AST 15 - 41 U/L 24 - -  ALT 0 - 44 U/L 20 - -    Pleural fluid LDH>10,000 Glucose<20   IMAGING RESULTS: Intervally developed empyema along the lateral pleural surface of the RIGHT  lower lung, measuring at least 10 cm craniocaudal extent with AP measurement of approximately 7 cm and thickness of approximately 3 cm.  Multiple additional empyemas along the posterior pleural surface of the RIGHT lower lung, multiloculated configuration, with measurements ranging from 2.5-5 cm greatest dimension, with intervening dense consolidations.  ?  Aug 2019  May 2019   Impression/Recommendation ?67 y.o. female with a history of smoking tobacco, COPD, anxiety, hypertension, right lung infiltrate presents from home because of swelling of legs, cough and shortness of breath.  Progressing nodularity of the right lung since May 2019. Now with Necrotizing pneumonia with abscesses and empyema    She had taken course of clindamycin( 3 weeks)  in August with no improvement.  what is the reason for this necrotizing pneumonia along with empyema?  Is this aspiration or is it underlying malignancy.  Or is it atypical infection like atypical mycobacteria or fungus.  Actinomycosis/Nocardia remote possibility . The LDH of the pleural fluid is more than 10,000 which indicates a bacterial infection rather than fungal or atypical mycobacteria. Will ask lab to do fungal and afb culture( for MAC or other atypical mycobacteria)  on the pleural fluid ( not suspecting TB)  She is going to need a video-assisted thoracoscopy procedure for debridement and   Decortication  as well as for tissue diagnosis from the lung and pleura as she has not clearly responded to previous interventions including EBUS/biopsy steroids and antibiotics and we do not have an answer yet . was on cefepime- changed to unasyn last eevening as she said she has taken augmentin But today she is restless and had some itching- So do not want to confuse the picture- so will stop unasyn and switch her to ceftriaxone and flagyl to cover for oral pathogens  PCN allergy she does not know what type of allergy she had.  Was told by her mother as a child that she had an allergy.  Most of the time is not a true penicillin allergy.  As she has tolerated Augmentin in the past I changed cefepime to Unasyn on 10/17 Discussed with her and her son about this and they are willing to change the antibiotic. Changed back to ceftriaxone+flagyl on 10/18 ( see above)  Anemia since the past few months.  She has been taking Advil every day and also has epigastric pain.  Rule out gastritis.  Recommend checking B12 and folate.  Nodularity of the liver found on CT scan suggestive of cirrhosis. She denies alcohol use.  Recommend checking the hepatitis profile.  Confusion and hallucination intermittently as per family for the past 8 months.   ?son says today since her retirement she has been drinking a lot until a couple of months ago. Also before retirement she used to live alone and not sure what her alcohol consumption was? She also was on xanax and other anti-anxiety meds ? 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  HIV negative  Will check RPR  Osteoporosis with L1 compression fracture ___________________________________________________  Discussed with patient and her son and the care team

## 2018-04-14 NOTE — Progress Notes (Signed)
[facility] transport at bedside to take pt to [facility]. All paperwork, radiology scans on disc and most recent VS given to transport staff. Pt is alert to self only at this time. IV dressing dry and intact. Skin is intact other than was is charted in the assessments. Report given to [facility]. Daughter at bedside and stated she will updated the family on transport. Pt is stable for transport.   Rilan Eiland [employer]

## 2018-04-14 NOTE — Discharge Summary (Addendum)
[place] [facility] [place] NAME: Wendy Arias    MR#:  [id number]  DATE OF BIRTH:  February 13, 1951  DATE OF ADMISSION:  04/09/2018   ADMITTING PHYSICIAN: Vaughan Basta, MD  DATE OF DISCHARGE:  04/14/18  PRIMARY CARE PHYSICIAN: Glendon Axe, MD   ADMISSION DIAGNOSIS:   Other chest pain [R07.89] Abscess of right lung with pneumonia, unspecified part of lung ([place]) [J85.1]  DISCHARGE DIAGNOSIS:   Principal Problem:   Abscess of middle lobe of right lung with pneumonia ([place]) Active Problems:   Pneumonia   Empyema of right pleural space (HCC)   Hepatic cirrhosis (HCC)   Anemia of chronic disease   Abnormal pleural fluid   SECONDARY DIAGNOSIS:   Past Medical History:  Diagnosis Date  . Anxiety   . Asthma   . Depression   . Hypertension   . Myocardial infarction ([place])   . Osteoporosis     HOSPITAL COURSE:   67 year old female with past medical history significant for depression anxiety, asthma, hypertension, osteoporosis admitted for recurrent right-sided pneumonia.  1.  Recurrent right-sided pneumonia with possible empyema- on IV cefepime. - MRSA pcr negative, vancomycin discontinued - was doing well on cefepime- however when unasyn added- patient was getting very restless and had a minimal itching- due to PCN allergy- that is being discontinued and changed to rocephin and flagyl for now - has been treated for recurrent pneumonias for almost 5 months now - had outpatient bronch and [place] bronch biopsy - Appreciate ID, pulmonary and cardiothoracic surgery -Status post IR guided drainage of the pleural fluid. Cultures pending -might need  decortication or extensive surgery which we cannot offer here. [facility] has been consulted- they have accepted the patient and she will be transferred today  2.  Acute metabolic encephalopathy-on Effexor and BuSpar as patient not taking at home according to sister - - discontinue requip as it was  just started yesterday - CT head is pending -Continue to monitor.  No focal deficits.  normal ammonia level  3.  Restless legs-potassium is within normal limits.  d/c Requip due to confusion  4.  Anemia of chronic disease-stable hemoglobin.  No indication for transfusion yet  5.  Lower extremity edema-no known congestive heart failure.  Dopplers negative for DVT.  Albumin is significantly low.  on IV albumin infusion -Urine analysis for proteinuria  6.  DVT prophylaxis-encourage ambulation.  Also on subcutaneous heparin. Physical therapy consulted   Son updated at bedside   DISCHARGE CONDITIONS:   Guarded  CONSULTS OBTAINED:   Treatment Team:  Nestor Lewandowsky, MD Tsosie Billing, MD Tyler Pita, MD  DRUG ALLERGIES:   Allergies  Allergen Reactions  . Penicillins Other (See Comments)    Has patient had a PCN reaction causing immediate rash, facial/tongue/throat swelling, SOB or lightheadedness with hypotension: Yes Has patient had a PCN reaction causing severe rash involving mucus membranes or skin necrosis: Unknown Has patient had a PCN reaction that required hospitalization: Unknown Has patient had a PCN reaction occurring within the last 10 years: Unknown If all of the above answers are "NO", then may proceed with Cephalosporin use. Pt has taken augmentin before   DISCHARGE MEDICATIONS:   Allergies as of 04/14/2018      Reactions   Penicillins Other (See Comments)   Has patient had a PCN reaction causing immediate rash, facial/tongue/throat swelling, SOB or lightheadedness with hypotension: Yes Has patient had a PCN reaction causing severe rash involving mucus membranes or  skin necrosis: Unknown Has patient had a PCN reaction that required hospitalization: Unknown Has patient had a PCN reaction occurring within the last 10 years: Unknown If all of the above answers are "NO", then may proceed with Cephalosporin use. Pt has taken augmentin before        Medication List    STOP taking these medications   fentaNYL 25 MCG/HR patch Commonly known as:  DURAGESIC - dosed mcg/hr   HYDROcodone-acetaminophen 5-325 MG tablet Commonly known as:  NORCO/VICODIN   hydrOXYzine 25 MG tablet Commonly known as:  ATARAX/VISTARIL   ibuprofen 400 MG tablet Commonly known as:  ADVIL,MOTRIN     TAKE these medications   albuterol 108 (90 Base) MCG/ACT inhaler Commonly known as:  PROVENTIL HFA;VENTOLIN HFA Inhale into the lungs every 6 (six) hours as needed for wheezing or shortness of breath.   ALPRAZolam 1 MG tablet Commonly known as:  XANAX Take 1 mg by mouth 3 (three) times daily as needed for anxiety.   busPIRone 15 MG tablet Commonly known as:  BUSPAR Take 15 mg by mouth 2 (two) times daily at 10 AM and 5 PM.   cefTRIAXone 2 g in sodium chloride 0.9 % 100 mL Inject 2 g into the vein daily.   docusate sodium 100 MG capsule Commonly known as:  COLACE Take 1 capsule (100 mg total) by mouth 2 (two) times daily as needed for mild constipation.   metoprolol succinate 50 MG 24 hr tablet Commonly known as:  TOPROL-XL Take 1 tablet (50 mg total) by mouth daily. Take with or immediately following a meal. Start taking on:  04/15/2018 What changed:  Another medication with the same name was removed. Continue taking this medication, and follow the directions you see here.   metroNIDAZOLE 5-0.79 MG/ML-% IVPB Commonly known as:  FLAGYL Inject 100 mLs (500 mg total) into the vein every 8 (eight) hours.   pantoprazole 40 MG tablet Commonly known as:  PROTONIX Take 40 mg by mouth 2 (two) times daily.   tiotropium 18 MCG inhalation capsule Commonly known as:  SPIRIVA Place 1 capsule (18 mcg total) into inhaler and inhale daily.   venlafaxine XR 75 MG 24 hr capsule Commonly known as:  EFFEXOR-XR Take 75 mg by mouth daily with breakfast.   vitamin B-12 1000 MCG tablet Commonly known as:  CYANOCOBALAMIN Take 1,000 mcg by mouth daily.    vitamin k 100 MCG tablet Take 1 tablet (100 mcg total) by mouth daily.        DISCHARGE INSTRUCTIONS:   1. Transfer to [facility] when bed available  DIET:   Low sodium diet  ACTIVITY:   Activity as tolerated  OXYGEN:   Home Oxygen: No.  Oxygen Delivery: room air  DISCHARGE LOCATION:   To [facility]   If you experience worsening of your admission symptoms, develop shortness of breath, life threatening emergency, suicidal or homicidal thoughts you must seek medical attention immediately by calling 911 or calling your MD immediately  if symptoms less severe.  You Must read complete instructions/literature along with all the possible adverse reactions/side effects for all the Medicines you take and that have been prescribed to you. Take any new Medicines after you have completely understood and accpet all the possible adverse reactions/side effects.   Please note  You were cared for by a hospitalist during your hospital stay. If you have any questions about your discharge medications or the care you received while you were in the hospital after you are discharged,  you can call the unit and asked to speak with the hospitalist on call if the hospitalist that took care of you is not available. Once you are discharged, your primary care physician will handle any further medical issues. Please note that NO REFILLS for any discharge medications will be authorized once you are discharged, as it is imperative that you return to your primary care physician (or establish a relationship with a primary care physician if you do not have one) for your aftercare needs so that they can reassess your need for medications and monitor your lab values.    On the day of Discharge:  VITAL SIGNS:   Blood pressure (!) 155/99, pulse 74, temperature 97.8 F (36.6 C), temperature source Oral, resp. rate 20, height 5\' 2"  (1.575 m), weight 51.7 kg, SpO2 100 %.  PHYSICAL EXAMINATION:   GENERAL:  67 y.o.-year-old  ill nourished patient sitting in the bed with no acute distress.  EYES: Pupils equal, round, reactive to light and accommodation. No scleral icterus. Extraocular muscles intact.  HEENT: Head atraumatic, normocephalic. Oropharynx and nasopharynx clear.  NECK:  Supple, no jugular venous distention. No thyroid enlargement, no tenderness.  LUNGS: Normal breath sounds bilaterally, no wheezing, rales,rhonchi or crepitation. No use of accessory muscles of respiration.  Decreased bibasilar breath sounds CARDIOVASCULAR: S1, S2 normal. No murmurs, rubs, or gallops.  ABDOMEN: Soft, nontender, nondistended. Bowel sounds present. No organomegaly or mass.  EXTREMITIES: No  cyanosis, or clubbing.  2-3+ lower extremity edema especially in the feet, right greater than left. NEUROLOGIC: Cranial nerves II through XII are intact. Muscle strength 5/5 in all extremities. Sensation intact. Gait not checked.  No weakness noted PSYCHIATRIC: The patient is alert and oriented x 2.  SKIN: No obvious rash, lesion, or ulcer.   DATA REVIEW:   CBC Recent Labs  Lab 04/13/18 1030  WBC 11.1*  HGB 8.1*  HCT 27.3*  PLT 288    Chemistries  Recent Labs  Lab 04/13/18 1030  NA 135  K 4.7  CL 101  CO2 26  GLUCOSE 199*  BUN 16  CREATININE 0.76  CALCIUM 7.8*  AST 24  ALT 20  ALKPHOS 197*  BILITOT 0.5     Microbiology Results  Results for orders placed or performed during the hospital encounter of 04/09/18  Blood culture (routine x 2)     Status: None   Collection Time: 04/09/18  4:59 PM  Result Value Ref Range Status   Specimen Description BLOOD RIGHT ANTECUBITAL  Final   Special Requests   Final    BOTTLES DRAWN AEROBIC AND ANAEROBIC Blood Culture results may not be optimal due to an excessive volume of blood received in culture bottles   Culture   Final    NO GROWTH 5 DAYS Performed at [facility], [address] [place], [place] [address]    Report Status 04/14/2018 FINAL  Final  Blood  culture (routine x 2)     Status: None   Collection Time: 04/09/18  4:59 PM  Result Value Ref Range Status   Specimen Description BLOOD LEFT ANTECUBITAL  Final   Special Requests   Final    BOTTLES DRAWN AEROBIC AND ANAEROBIC Blood Culture results may not be optimal due to an excessive volume of blood received in culture bottles   Culture   Final    NO GROWTH 5 DAYS Performed at [facility], [address] [place], [place] [address]    Report Status 04/14/2018 FINAL  Final  MRSA PCR Screening     Status: None   Collection Time: 04/10/18  5:36 AM  Result Value Ref Range Status   MRSA by PCR NEGATIVE NEGATIVE Final    Comment:        The GeneXpert MRSA Assay (FDA approved for NASAL specimens only), is one component of a comprehensive MRSA colonization surveillance program. It is not intended to diagnose MRSA infection nor to guide or monitor treatment for MRSA infections. Performed at [facility], [address] [place], [place] [address]   Body fluid culture     Status: None (Preliminary result)   Collection Time: 04/12/18  7:29 AM  Result Value Ref Range Status   Specimen Description   Final    PLEURAL Performed at [facility], [address], [place], [place] [address]    Special Requests   Final    Normal Performed at [facility], [address] [place], [place] [address]    Gram Stain   Final    ABUNDANT WBC PRESENT, PREDOMINANTLY PMN ABUNDANT GRAM POSITIVE COCCI    Culture   Final    CULTURE REINCUBATED FOR BETTER GROWTH Performed at [facility], [place] [address], [place],  [address]    Report Status PENDING  Incomplete    RADIOLOGY:  No results found.   Management plans discussed with the patient, family and they are in agreement.  CODE STATUS:     Code Status Orders  (From admission, onward)         Start     Ordered   04/09/18 1817  Do not attempt resuscitation (DNR)  Continuous      Question Answer Comment  In the event of cardiac or respiratory ARREST Do not call a "code blue"   In the event of cardiac or respiratory ARREST Do not perform Intubation, CPR, defibrillation or ACLS   In the event of cardiac or respiratory ARREST Use medication by any route, position, wound care, and other measures to relive pain and suffering. May use oxygen, suction and manual treatment of airway obstruction as needed for comfort.      04/09/18 1816        Code Status History    Date Active Date Inactive Code Status Order ID Comments User Context   02/16/2018 1842 02/19/2018 1751 DNR [id number]  Sela Hua, MD Inpatient   11/03/2017 0336 11/04/2017 1928 Full Code [id number]  Amelia Jo, MD Inpatient      TOTAL TIME TAKING CARE OF THIS PATIENT: 41 minutes.    Gladstone Lighter M.D on 04/14/2018 at 12:53 PM  Between 7am to 6pm - Pager - [phone]  After 6pm go to [email or web address] - Proofreader  [facility]  Office  [phone]  CC: Primary care physician; Glendon Axe, MD   Note: This dictation was prepared with Dragon dictation along with smaller phrase technology. Any transcriptional errors that result from this process are unintentional.

## 2018-04-15 LAB — BODY FLUID CULTURE: Special Requests: NORMAL

## 2018-04-15 LAB — RPR: RPR Ser Ql: NONREACTIVE

## 2018-04-16 LAB — ACID FAST SMEAR (AFB): ACID FAST SMEAR - AFSCU2: NEGATIVE

## 2018-04-17 DIAGNOSIS — T17908A Unspecified foreign body in respiratory tract, part unspecified causing other injury, initial encounter: Secondary | ICD-10-CM | POA: Insufficient documentation

## 2018-04-17 DIAGNOSIS — F32A Depression, unspecified: Secondary | ICD-10-CM | POA: Insufficient documentation

## 2018-04-17 DIAGNOSIS — R0609 Other forms of dyspnea: Secondary | ICD-10-CM | POA: Insufficient documentation

## 2018-04-17 DIAGNOSIS — R1310 Dysphagia, unspecified: Secondary | ICD-10-CM | POA: Insufficient documentation

## 2018-04-17 DIAGNOSIS — F419 Anxiety disorder, unspecified: Secondary | ICD-10-CM | POA: Insufficient documentation

## 2018-04-17 DIAGNOSIS — A491 Streptococcal infection, unspecified site: Secondary | ICD-10-CM | POA: Insufficient documentation

## 2018-04-17 DIAGNOSIS — D509 Iron deficiency anemia, unspecified: Secondary | ICD-10-CM | POA: Insufficient documentation

## 2018-04-17 MED ORDER — GABAPENTIN 300 MG/6ML PO SOLN
100.00 | ORAL | Status: DC
Start: 2018-04-17 — End: 2018-04-17

## 2018-04-17 MED ORDER — VITAMIN C 500 MG PO TABS
500.00 | ORAL_TABLET | ORAL | Status: DC
Start: 2018-04-17 — End: 2018-04-17

## 2018-04-17 MED ORDER — GENERIC EXTERNAL MEDICATION
12.50 | Status: DC
Start: 2018-04-17 — End: 2018-04-17

## 2018-04-17 MED ORDER — HEPARIN SODIUM (PORCINE) 5000 UNIT/ML IJ SOLN
5000.00 | INTRAMUSCULAR | Status: DC
Start: 2018-04-17 — End: 2018-04-17

## 2018-04-17 MED ORDER — ONDANSETRON 4 MG PO TBDP
4.00 | ORAL_TABLET | ORAL | Status: DC
Start: ? — End: 2018-04-17

## 2018-04-17 MED ORDER — SENNOSIDES-DOCUSATE SODIUM 8.6-50 MG PO TABS
2.00 | ORAL_TABLET | ORAL | Status: DC
Start: 2018-04-17 — End: 2018-04-17

## 2018-04-17 MED ORDER — IPRATROPIUM-ALBUTEROL 0.5-2.5 (3) MG/3ML IN SOLN
3.00 | RESPIRATORY_TRACT | Status: DC
Start: ? — End: 2018-04-17

## 2018-04-17 MED ORDER — ASPIRIN 325 MG PO TABS
325.00 | ORAL_TABLET | ORAL | Status: DC
Start: 2018-04-18 — End: 2018-04-17

## 2018-04-17 MED ORDER — GENERIC EXTERNAL MEDICATION
2.00 | Status: DC
Start: 2018-04-17 — End: 2018-04-17

## 2018-04-17 MED ORDER — FERROUS SULFATE 324 (65 FE) MG PO TBEC
324.00 | DELAYED_RELEASE_TABLET | ORAL | Status: DC
Start: 2018-04-17 — End: 2018-04-17

## 2018-04-17 MED ORDER — FOLIC ACID 1 MG PO TABS
1.00 | ORAL_TABLET | ORAL | Status: DC
Start: 2018-04-18 — End: 2018-04-17

## 2018-04-17 MED ORDER — OXYCODONE HCL 5 MG PO TABS
5.00 | ORAL_TABLET | ORAL | Status: DC
Start: ? — End: 2018-04-17

## 2018-04-17 MED ORDER — ACETAMINOPHEN 325 MG PO TABS
975.00 | ORAL_TABLET | ORAL | Status: DC
Start: 2018-04-17 — End: 2018-04-17

## 2018-04-17 MED ORDER — TIOTROPIUM BROMIDE MONOHYDRATE 18 MCG IN CAPS
18.00 | ORAL_CAPSULE | RESPIRATORY_TRACT | Status: DC
Start: 2018-04-18 — End: 2018-04-17

## 2018-04-17 MED ORDER — IPRATROPIUM-ALBUTEROL 0.5-2.5 (3) MG/3ML IN SOLN
3.00 | RESPIRATORY_TRACT | Status: DC
Start: 2018-04-17 — End: 2018-04-17

## 2018-04-17 MED ORDER — LIDOCAINE HCL 1 % IJ SOLN
0.50 | INTRAMUSCULAR | Status: DC
Start: ? — End: 2018-04-17

## 2018-04-17 MED ORDER — PANTOPRAZOLE SODIUM 40 MG PO TBEC
40.00 | DELAYED_RELEASE_TABLET | ORAL | Status: DC
Start: 2018-04-18 — End: 2018-04-17

## 2018-04-17 MED ORDER — LIDOCAINE 5 % EX PTCH
2.00 | MEDICATED_PATCH | CUTANEOUS | Status: DC
Start: 2018-04-17 — End: 2018-04-17

## 2018-04-17 MED ORDER — HYDROXYZINE HCL 10 MG PO TABS
10.00 | ORAL_TABLET | ORAL | Status: DC
Start: ? — End: 2018-04-17

## 2018-04-17 MED ORDER — POLYETHYLENE GLYCOL 3350 17 G PO PACK
17.00 | PACK | ORAL | Status: DC
Start: 2018-04-18 — End: 2018-04-17

## 2018-04-17 MED ORDER — GENERIC EXTERNAL MEDICATION
Status: DC
Start: ? — End: 2018-04-17

## 2018-04-17 MED ORDER — THIAMINE HCL 100 MG PO TABS
50.00 | ORAL_TABLET | ORAL | Status: DC
Start: 2018-04-18 — End: 2018-04-17

## 2018-04-17 MED ORDER — VENLAFAXINE HCL ER 75 MG PO CP24
75.00 | ORAL_CAPSULE | ORAL | Status: DC
Start: 2018-04-18 — End: 2018-04-17

## 2018-04-17 MED ORDER — BUSPIRONE HCL 10 MG PO TABS
10.00 | ORAL_TABLET | ORAL | Status: DC
Start: 2018-04-17 — End: 2018-04-17

## 2018-04-17 MED ORDER — SIMVASTATIN 20 MG PO TABS
20.00 | ORAL_TABLET | ORAL | Status: DC
Start: 2018-04-17 — End: 2018-04-17

## 2018-04-18 DIAGNOSIS — G8929 Other chronic pain: Secondary | ICD-10-CM | POA: Insufficient documentation

## 2018-04-18 DIAGNOSIS — I255 Ischemic cardiomyopathy: Secondary | ICD-10-CM | POA: Insufficient documentation

## 2018-04-18 DIAGNOSIS — G5771 Causalgia of right lower limb: Secondary | ICD-10-CM | POA: Insufficient documentation

## 2018-04-18 DIAGNOSIS — Z9189 Other specified personal risk factors, not elsewhere classified: Secondary | ICD-10-CM | POA: Insufficient documentation

## 2018-04-18 DIAGNOSIS — I340 Nonrheumatic mitral (valve) insufficiency: Secondary | ICD-10-CM | POA: Insufficient documentation

## 2018-05-01 DIAGNOSIS — J869 Pyothorax without fistula: Secondary | ICD-10-CM | POA: Diagnosis not present

## 2018-05-01 DIAGNOSIS — R1310 Dysphagia, unspecified: Secondary | ICD-10-CM | POA: Diagnosis not present

## 2018-05-01 DIAGNOSIS — Z87891 Personal history of nicotine dependence: Secondary | ICD-10-CM | POA: Diagnosis not present

## 2018-05-01 DIAGNOSIS — J189 Pneumonia, unspecified organism: Secondary | ICD-10-CM | POA: Diagnosis not present

## 2018-05-01 DIAGNOSIS — E46 Unspecified protein-calorie malnutrition: Secondary | ICD-10-CM | POA: Diagnosis not present

## 2018-05-01 DIAGNOSIS — G4700 Insomnia, unspecified: Secondary | ICD-10-CM | POA: Diagnosis not present

## 2018-05-01 DIAGNOSIS — Z4801 Encounter for change or removal of surgical wound dressing: Secondary | ICD-10-CM | POA: Diagnosis not present

## 2018-05-01 DIAGNOSIS — F329 Major depressive disorder, single episode, unspecified: Secondary | ICD-10-CM | POA: Diagnosis not present

## 2018-05-01 DIAGNOSIS — Z9181 History of falling: Secondary | ICD-10-CM | POA: Diagnosis not present

## 2018-05-01 DIAGNOSIS — D509 Iron deficiency anemia, unspecified: Secondary | ICD-10-CM | POA: Diagnosis not present

## 2018-05-01 DIAGNOSIS — M810 Age-related osteoporosis without current pathological fracture: Secondary | ICD-10-CM | POA: Diagnosis not present

## 2018-05-01 DIAGNOSIS — F419 Anxiety disorder, unspecified: Secondary | ICD-10-CM | POA: Diagnosis not present

## 2018-05-01 DIAGNOSIS — E7800 Pure hypercholesterolemia, unspecified: Secondary | ICD-10-CM | POA: Diagnosis not present

## 2018-05-01 DIAGNOSIS — K219 Gastro-esophageal reflux disease without esophagitis: Secondary | ICD-10-CM | POA: Diagnosis not present

## 2018-05-01 DIAGNOSIS — I10 Essential (primary) hypertension: Secondary | ICD-10-CM | POA: Diagnosis not present

## 2018-05-01 DIAGNOSIS — J440 Chronic obstructive pulmonary disease with acute lower respiratory infection: Secondary | ICD-10-CM | POA: Diagnosis not present

## 2018-05-03 DIAGNOSIS — J869 Pyothorax without fistula: Secondary | ICD-10-CM | POA: Diagnosis not present

## 2018-05-03 DIAGNOSIS — I10 Essential (primary) hypertension: Secondary | ICD-10-CM | POA: Diagnosis not present

## 2018-05-03 DIAGNOSIS — M940 Chondrocostal junction syndrome [Tietze]: Secondary | ICD-10-CM | POA: Diagnosis not present

## 2018-05-03 DIAGNOSIS — I5033 Acute on chronic diastolic (congestive) heart failure: Secondary | ICD-10-CM | POA: Diagnosis not present

## 2018-05-03 DIAGNOSIS — Z9689 Presence of other specified functional implants: Secondary | ICD-10-CM | POA: Diagnosis not present

## 2018-05-04 LAB — CULTURE, FUNGUS WITHOUT SMEAR: Special Requests: NORMAL

## 2018-05-08 DIAGNOSIS — T8189XA Other complications of procedures, not elsewhere classified, initial encounter: Secondary | ICD-10-CM | POA: Diagnosis not present

## 2018-05-08 DIAGNOSIS — Z4682 Encounter for fitting and adjustment of non-vascular catheter: Secondary | ICD-10-CM | POA: Diagnosis not present

## 2018-05-08 DIAGNOSIS — Z87891 Personal history of nicotine dependence: Secondary | ICD-10-CM | POA: Diagnosis not present

## 2018-05-08 DIAGNOSIS — R918 Other nonspecific abnormal finding of lung field: Secondary | ICD-10-CM | POA: Diagnosis not present

## 2018-05-08 DIAGNOSIS — Z79899 Other long term (current) drug therapy: Secondary | ICD-10-CM | POA: Diagnosis not present

## 2018-05-08 DIAGNOSIS — I340 Nonrheumatic mitral (valve) insufficiency: Secondary | ICD-10-CM | POA: Diagnosis not present

## 2018-05-08 DIAGNOSIS — J869 Pyothorax without fistula: Secondary | ICD-10-CM | POA: Diagnosis not present

## 2018-05-08 DIAGNOSIS — I255 Ischemic cardiomyopathy: Secondary | ICD-10-CM | POA: Diagnosis not present

## 2018-05-08 DIAGNOSIS — J851 Abscess of lung with pneumonia: Secondary | ICD-10-CM | POA: Diagnosis not present

## 2018-05-08 DIAGNOSIS — A491 Streptococcal infection, unspecified site: Secondary | ICD-10-CM | POA: Diagnosis not present

## 2018-05-08 DIAGNOSIS — I2510 Atherosclerotic heart disease of native coronary artery without angina pectoris: Secondary | ICD-10-CM | POA: Insufficient documentation

## 2018-05-15 DIAGNOSIS — A491 Streptococcal infection, unspecified site: Secondary | ICD-10-CM | POA: Diagnosis not present

## 2018-05-15 DIAGNOSIS — I517 Cardiomegaly: Secondary | ICD-10-CM | POA: Diagnosis not present

## 2018-05-15 DIAGNOSIS — J851 Abscess of lung with pneumonia: Secondary | ICD-10-CM | POA: Diagnosis not present

## 2018-05-15 DIAGNOSIS — J90 Pleural effusion, not elsewhere classified: Secondary | ICD-10-CM | POA: Diagnosis not present

## 2018-05-15 DIAGNOSIS — Z4682 Encounter for fitting and adjustment of non-vascular catheter: Secondary | ICD-10-CM | POA: Diagnosis not present

## 2018-05-15 DIAGNOSIS — Z87891 Personal history of nicotine dependence: Secondary | ICD-10-CM | POA: Diagnosis not present

## 2018-05-15 DIAGNOSIS — R918 Other nonspecific abnormal finding of lung field: Secondary | ICD-10-CM | POA: Diagnosis not present

## 2018-05-15 DIAGNOSIS — J984 Other disorders of lung: Secondary | ICD-10-CM | POA: Diagnosis not present

## 2018-05-15 DIAGNOSIS — J869 Pyothorax without fistula: Secondary | ICD-10-CM | POA: Diagnosis not present

## 2018-05-17 DIAGNOSIS — F329 Major depressive disorder, single episode, unspecified: Secondary | ICD-10-CM | POA: Diagnosis not present

## 2018-05-17 DIAGNOSIS — R1310 Dysphagia, unspecified: Secondary | ICD-10-CM | POA: Diagnosis not present

## 2018-05-17 DIAGNOSIS — J189 Pneumonia, unspecified organism: Secondary | ICD-10-CM | POA: Diagnosis not present

## 2018-05-17 DIAGNOSIS — J449 Chronic obstructive pulmonary disease, unspecified: Secondary | ICD-10-CM | POA: Diagnosis not present

## 2018-05-17 DIAGNOSIS — D509 Iron deficiency anemia, unspecified: Secondary | ICD-10-CM | POA: Diagnosis not present

## 2018-05-17 DIAGNOSIS — Z4801 Encounter for change or removal of surgical wound dressing: Secondary | ICD-10-CM | POA: Diagnosis not present

## 2018-05-17 DIAGNOSIS — J986 Disorders of diaphragm: Secondary | ICD-10-CM | POA: Diagnosis not present

## 2018-05-17 DIAGNOSIS — I10 Essential (primary) hypertension: Secondary | ICD-10-CM | POA: Diagnosis not present

## 2018-05-28 LAB — ACID FAST CULTURE WITH REFLEXED SENSITIVITIES (MYCOBACTERIA): Acid Fast Culture: NEGATIVE

## 2018-05-28 LAB — ACID FAST CULTURE WITH REFLEXED SENSITIVITIES

## 2018-05-29 DIAGNOSIS — I4581 Long QT syndrome: Secondary | ICD-10-CM | POA: Diagnosis not present

## 2018-05-29 DIAGNOSIS — R918 Other nonspecific abnormal finding of lung field: Secondary | ICD-10-CM | POA: Diagnosis not present

## 2018-05-29 DIAGNOSIS — Z5181 Encounter for therapeutic drug level monitoring: Secondary | ICD-10-CM | POA: Diagnosis not present

## 2018-05-29 DIAGNOSIS — R079 Chest pain, unspecified: Secondary | ICD-10-CM | POA: Diagnosis not present

## 2018-05-29 DIAGNOSIS — Z79899 Other long term (current) drug therapy: Secondary | ICD-10-CM | POA: Diagnosis not present

## 2018-05-29 DIAGNOSIS — R0789 Other chest pain: Secondary | ICD-10-CM | POA: Diagnosis not present

## 2018-05-29 DIAGNOSIS — J869 Pyothorax without fistula: Secondary | ICD-10-CM | POA: Diagnosis not present

## 2018-05-29 DIAGNOSIS — I517 Cardiomegaly: Secondary | ICD-10-CM | POA: Diagnosis not present

## 2018-07-11 DIAGNOSIS — L299 Pruritus, unspecified: Secondary | ICD-10-CM | POA: Diagnosis not present

## 2018-07-11 DIAGNOSIS — I10 Essential (primary) hypertension: Secondary | ICD-10-CM | POA: Diagnosis not present

## 2018-07-11 DIAGNOSIS — I5032 Chronic diastolic (congestive) heart failure: Secondary | ICD-10-CM | POA: Diagnosis not present

## 2018-08-09 DIAGNOSIS — I5032 Chronic diastolic (congestive) heart failure: Secondary | ICD-10-CM | POA: Insufficient documentation

## 2018-08-09 DIAGNOSIS — Z8709 Personal history of other diseases of the respiratory system: Secondary | ICD-10-CM | POA: Insufficient documentation

## 2018-08-09 DIAGNOSIS — L299 Pruritus, unspecified: Secondary | ICD-10-CM | POA: Insufficient documentation

## 2018-08-09 DIAGNOSIS — F5104 Psychophysiologic insomnia: Secondary | ICD-10-CM | POA: Insufficient documentation

## 2018-08-09 DIAGNOSIS — R748 Abnormal levels of other serum enzymes: Secondary | ICD-10-CM | POA: Diagnosis not present

## 2018-08-09 DIAGNOSIS — R1013 Epigastric pain: Secondary | ICD-10-CM | POA: Diagnosis not present

## 2018-08-09 DIAGNOSIS — R053 Chronic cough: Secondary | ICD-10-CM | POA: Insufficient documentation

## 2018-08-09 DIAGNOSIS — R05 Cough: Secondary | ICD-10-CM | POA: Diagnosis not present

## 2018-08-10 DIAGNOSIS — R748 Abnormal levels of other serum enzymes: Secondary | ICD-10-CM | POA: Insufficient documentation

## 2018-08-11 ENCOUNTER — Other Ambulatory Visit: Payer: Self-pay | Admitting: Internal Medicine

## 2018-08-11 DIAGNOSIS — R1013 Epigastric pain: Secondary | ICD-10-CM

## 2018-08-11 DIAGNOSIS — R748 Abnormal levels of other serum enzymes: Secondary | ICD-10-CM

## 2018-08-28 DIAGNOSIS — R945 Abnormal results of liver function studies: Secondary | ICD-10-CM | POA: Diagnosis not present

## 2018-08-28 DIAGNOSIS — R932 Abnormal findings on diagnostic imaging of liver and biliary tract: Secondary | ICD-10-CM | POA: Diagnosis not present

## 2018-10-26 DIAGNOSIS — R05 Cough: Secondary | ICD-10-CM | POA: Diagnosis not present

## 2018-10-26 DIAGNOSIS — I10 Essential (primary) hypertension: Secondary | ICD-10-CM | POA: Diagnosis not present

## 2018-10-26 DIAGNOSIS — Z8709 Personal history of other diseases of the respiratory system: Secondary | ICD-10-CM | POA: Diagnosis not present

## 2018-10-26 DIAGNOSIS — L659 Nonscarring hair loss, unspecified: Secondary | ICD-10-CM | POA: Diagnosis not present

## 2018-10-26 DIAGNOSIS — F5104 Psychophysiologic insomnia: Secondary | ICD-10-CM | POA: Diagnosis not present

## 2018-12-05 DIAGNOSIS — I119 Hypertensive heart disease without heart failure: Secondary | ICD-10-CM | POA: Diagnosis not present

## 2018-12-05 DIAGNOSIS — I340 Nonrheumatic mitral (valve) insufficiency: Secondary | ICD-10-CM | POA: Diagnosis not present

## 2018-12-05 DIAGNOSIS — I10 Essential (primary) hypertension: Secondary | ICD-10-CM | POA: Diagnosis not present

## 2018-12-05 DIAGNOSIS — I255 Ischemic cardiomyopathy: Secondary | ICD-10-CM | POA: Diagnosis not present

## 2018-12-05 DIAGNOSIS — I2510 Atherosclerotic heart disease of native coronary artery without angina pectoris: Secondary | ICD-10-CM | POA: Diagnosis not present

## 2018-12-05 DIAGNOSIS — R0602 Shortness of breath: Secondary | ICD-10-CM | POA: Diagnosis not present

## 2018-12-15 DIAGNOSIS — I340 Nonrheumatic mitral (valve) insufficiency: Secondary | ICD-10-CM | POA: Diagnosis not present

## 2019-01-15 DIAGNOSIS — Z20828 Contact with and (suspected) exposure to other viral communicable diseases: Secondary | ICD-10-CM | POA: Diagnosis not present

## 2019-01-15 DIAGNOSIS — R05 Cough: Secondary | ICD-10-CM | POA: Diagnosis not present

## 2019-01-15 DIAGNOSIS — R509 Fever, unspecified: Secondary | ICD-10-CM | POA: Diagnosis not present

## 2019-01-18 ENCOUNTER — Telehealth: Payer: Self-pay | Admitting: Cardiothoracic Surgery

## 2019-01-18 NOTE — Telephone Encounter (Signed)
Patient has called with questions concerning her medical bill from 2019 when admitting to [facility]. Patient also has questions concerning her pulmonary issues and would like to know if Dr Genevive Bi could provide her with any advice. I will follow up with patient after reviewing billing transactions and after I speak with Dr Genevive Bi.

## 2019-01-25 DIAGNOSIS — R1013 Epigastric pain: Secondary | ICD-10-CM | POA: Diagnosis not present

## 2019-01-25 DIAGNOSIS — R1900 Intra-abdominal and pelvic swelling, mass and lump, unspecified site: Secondary | ICD-10-CM | POA: Diagnosis not present

## 2019-01-25 DIAGNOSIS — R05 Cough: Secondary | ICD-10-CM | POA: Diagnosis not present

## 2019-01-26 ENCOUNTER — Other Ambulatory Visit: Payer: Self-pay | Admitting: Family Medicine

## 2019-01-26 DIAGNOSIS — R1013 Epigastric pain: Secondary | ICD-10-CM

## 2019-02-08 ENCOUNTER — Other Ambulatory Visit: Payer: Self-pay

## 2019-02-08 ENCOUNTER — Ambulatory Visit
Admission: RE | Admit: 2019-02-08 | Discharge: 2019-02-08 | Disposition: A | Discharge status: Home / Self Care | Payer: PPO | Source: Ambulatory Visit | Attending: Family Medicine | Admitting: Family Medicine

## 2019-02-08 DIAGNOSIS — R1013 Epigastric pain: Secondary | ICD-10-CM | POA: Diagnosis not present

## 2019-02-08 DIAGNOSIS — K7689 Other specified diseases of liver: Secondary | ICD-10-CM | POA: Diagnosis not present

## 2019-02-21 IMAGING — CT CT CHEST W/ CM
1 series · 15 of 34 positions shown, 19 images · IV contrast (iopamidol)
Comparison: Abdominal CT scan 11/14/2017

CLINICAL DATA: Followup abnormal abdominal CT scan.

EXAM:
CT CHEST WITH CONTRAST
TECHNIQUE: Multidetector CT imaging of the chest was performed during
intravenous contrast administration.
CONTRAST:  50mL S3ZILH-WCC IOPAMIDOL (S3ZILH-WCC) INJECTION 61%

[Series 2: axial st · axial · 0.58mm/px · z∈[-476,-236]mm · 15 of 142 slices shown, 19 images]
[im 11/142  mediastinal]
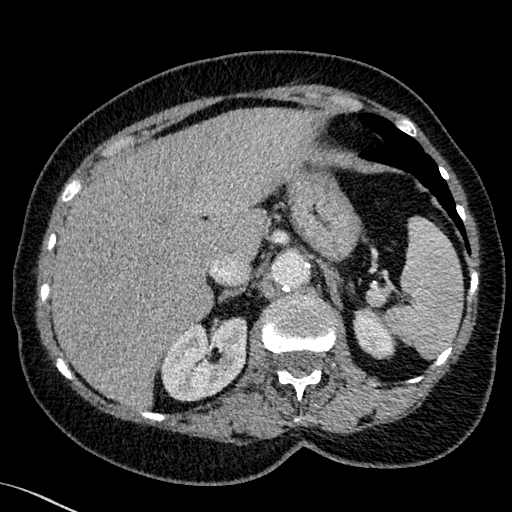
[im 11/142  lung]
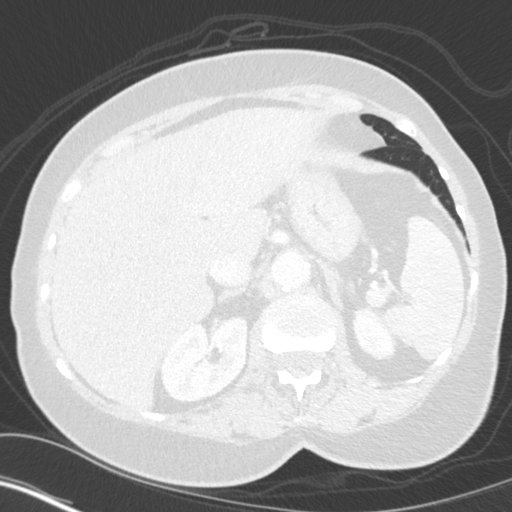
[im 21/142  lung]
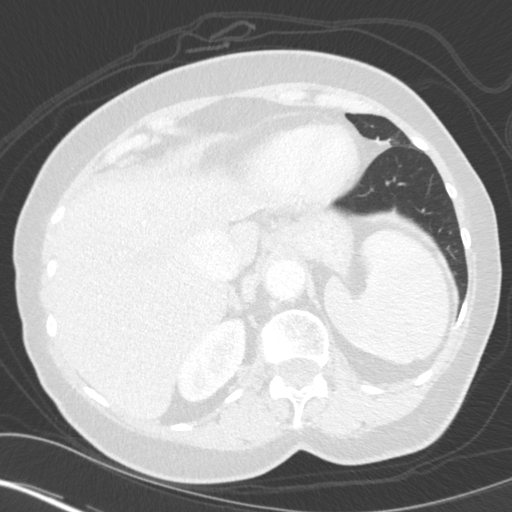
[im 29/142  lung]
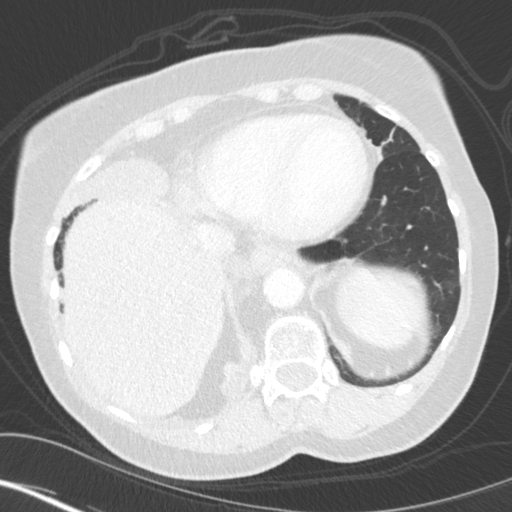
[im 37/142  lung]
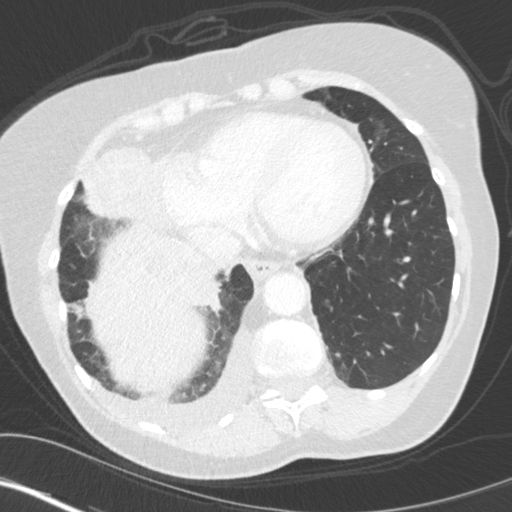
[im 48/142  mediastinal]
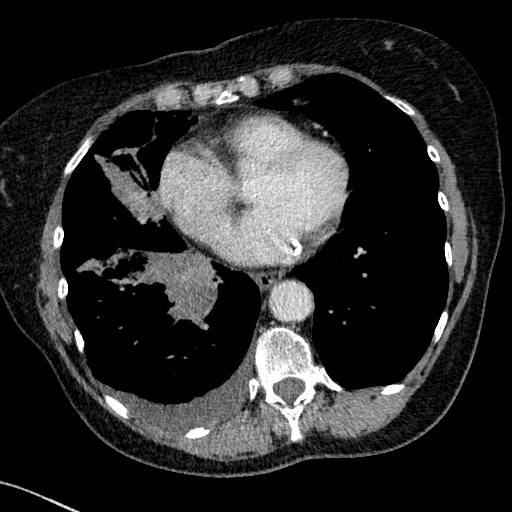
[im 48/142  lung]
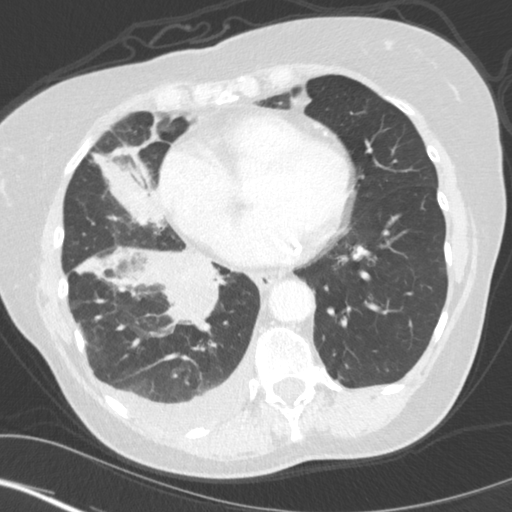
[im 57/142  lung]
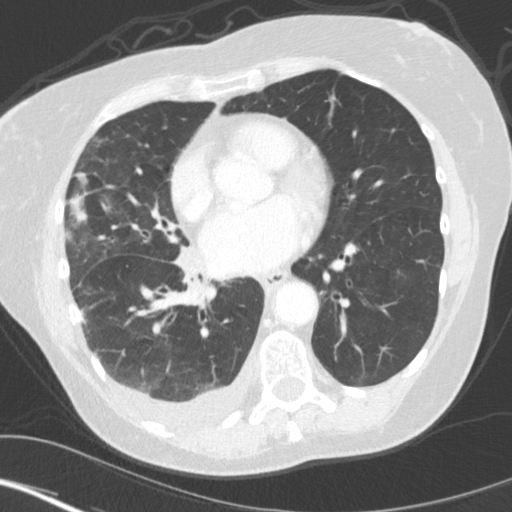
[im 63/142  lung]
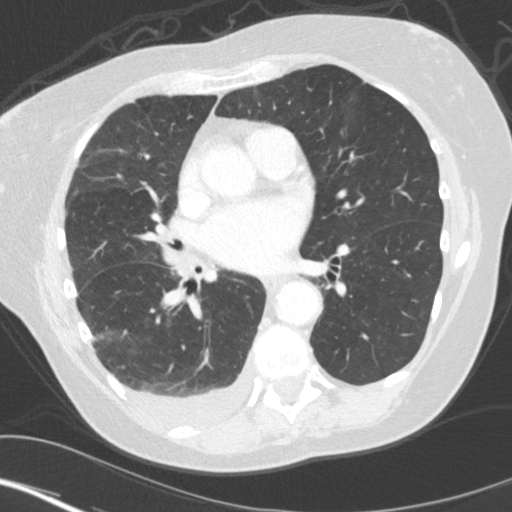
[im 74/142  lung]
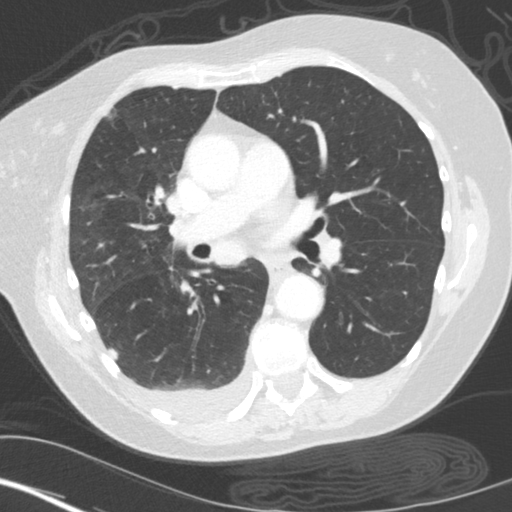
[im 79/142  mediastinal]
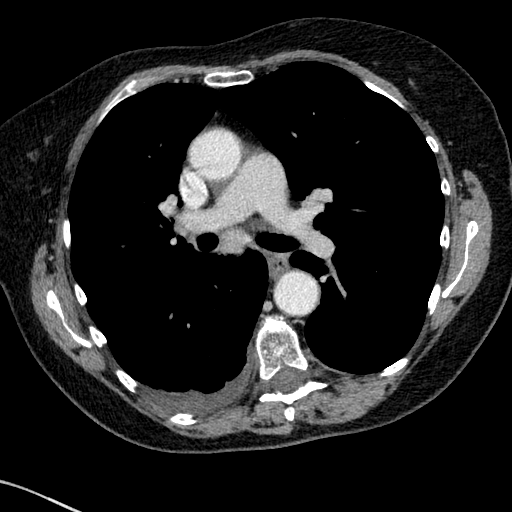
[im 79/142  lung]
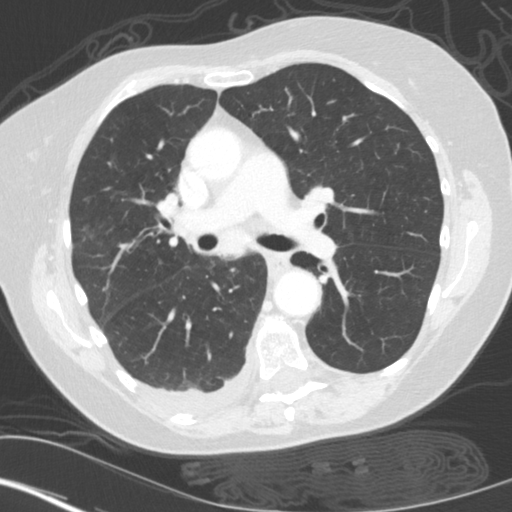
[im 85/142  lung]
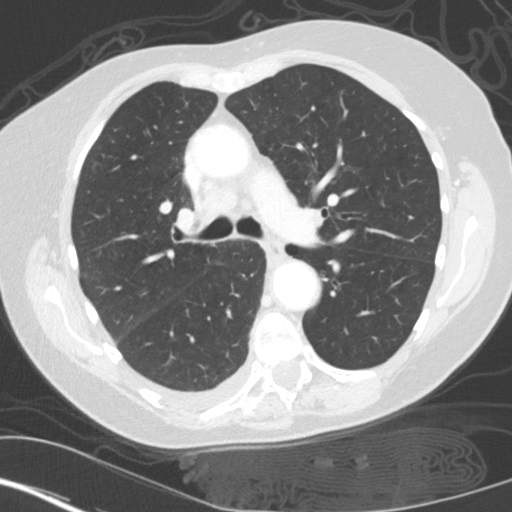
[im 95/142  lung]
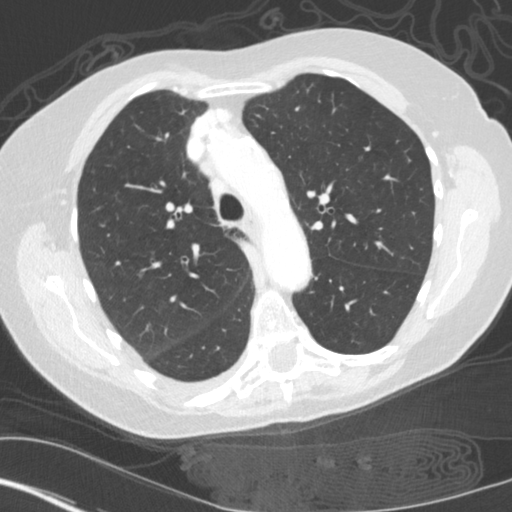
[im 105/142  lung]
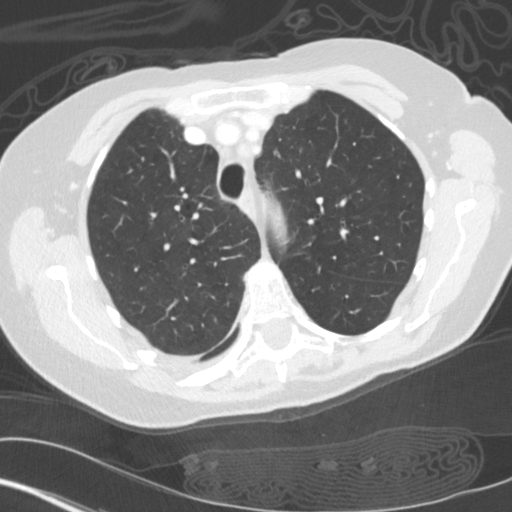
[im 113/142  mediastinal]
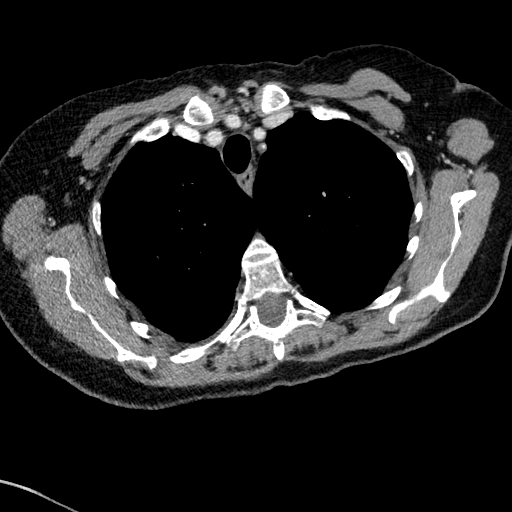
[im 113/142  lung]
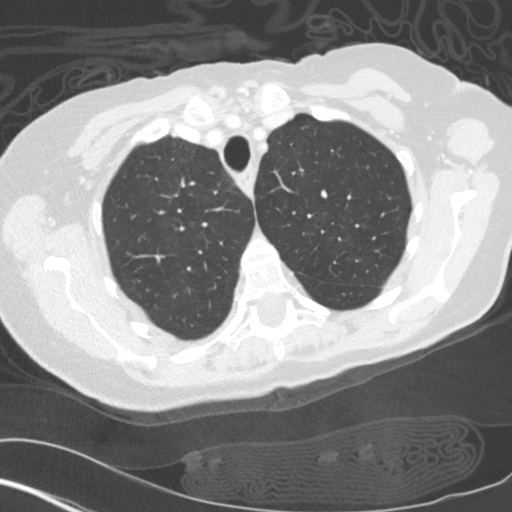
[im 121/142  lung]
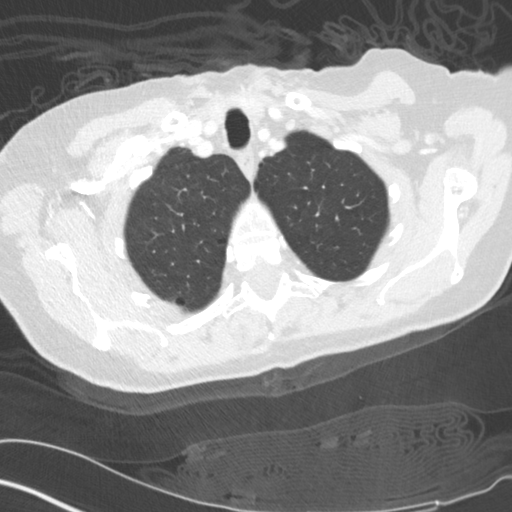
[im 131/142  lung]
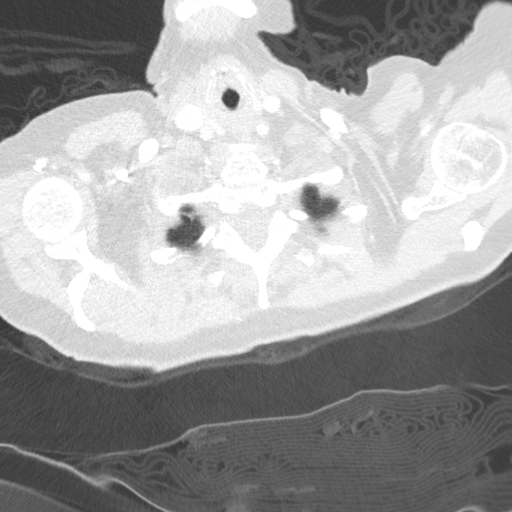

[15 of 34 positions shown; findings below may reference images not displayed]

FINDINGS: Cardiovascular: The heart is normal in size. No pericardial
effusion. There is mild tortuosity and ectasia of the thoracic aorta
along with moderate calcifications along the aortic arch. No focal
aneurysm or dissection. Scattered coronary artery calcifications.

Mediastinum/Nodes: Mediastinal and hilar lymphadenopathy. 1.9 cm
nodal mass in the carinal space on image number 59.

1.4 cm subcarinal node on image number 66. 8.5 mm right infrahilar
lymph node on image number 81.

The esophagus is grossly normal.

Lungs/Pleura: Mild underlying emphysema.

As demonstrated on the recent abdominal CT scan there are right
middle lobe and right lower lobe masses. Both of these appear
somewhat necrotic and I do not see any air bronchograms to suggest
this is pneumonia. The right lower lobe lesion measures
approximately 5.5 x 4.2 cm and the right middle lobe lesion measures
approximately 4.4 x 4.1 cm.

8 mm subpleural nodule noted in the right lower lobe on image number
68.

Small right pleural effusion.

Upper Abdomen: No significant upper abdominal findings. No findings
suspicious for hepatic or adrenal gland metastasis.

Musculoskeletal: No discrete breast mass. No axillary
lymphadenopathy. There are small scattered left supraclavicular
lymph nodes.

Osteoporotic changes. No findings suspicious for osseous metastatic
disease.
IMPRESSION: 1. Two large necrotic appearing lung lesions, 1 in the right middle
lobe and 1 in the right lower lobe. Recommend biopsy. PET-CT would
also be helpful for further evaluation and staging.
2. Mediastinal and right hilar lymphadenopathy.
3. 8 mm subpleural right lower lobe pulmonary nodule is
indeterminate could be metastatic.
4. No findings for upper abdominal metastatic disease.
5. Small left supraclavicular lymph nodes are indeterminate.
6. No definite findings for osseous metastatic disease.

Aortic Atherosclerosis (DLHPJ-5CP.P) and Emphysema (DLHPJ-R0K.9).

## 2020-01-09 DIAGNOSIS — H25041 Posterior subcapsular polar age-related cataract, right eye: Secondary | ICD-10-CM | POA: Diagnosis not present

## 2020-01-09 DIAGNOSIS — H25013 Cortical age-related cataract, bilateral: Secondary | ICD-10-CM | POA: Diagnosis not present

## 2020-01-09 DIAGNOSIS — H2513 Age-related nuclear cataract, bilateral: Secondary | ICD-10-CM | POA: Diagnosis not present

## 2020-09-15 DIAGNOSIS — I10 Essential (primary) hypertension: Secondary | ICD-10-CM | POA: Diagnosis not present

## 2020-09-15 DIAGNOSIS — I255 Ischemic cardiomyopathy: Secondary | ICD-10-CM | POA: Diagnosis not present

## 2020-09-15 DIAGNOSIS — F411 Generalized anxiety disorder: Secondary | ICD-10-CM | POA: Diagnosis not present

## 2020-09-15 DIAGNOSIS — I5032 Chronic diastolic (congestive) heart failure: Secondary | ICD-10-CM | POA: Diagnosis not present

## 2020-09-15 DIAGNOSIS — J449 Chronic obstructive pulmonary disease, unspecified: Secondary | ICD-10-CM | POA: Diagnosis not present

## 2020-09-15 DIAGNOSIS — I2510 Atherosclerotic heart disease of native coronary artery without angina pectoris: Secondary | ICD-10-CM | POA: Diagnosis not present

## 2020-10-28 ENCOUNTER — Other Ambulatory Visit
Admission: RE | Admit: 2020-10-28 | Discharge: 2020-10-28 | Disposition: A | Discharge status: Home / Self Care | Payer: PPO | Source: Ambulatory Visit | Attending: Physician Assistant | Admitting: Physician Assistant

## 2020-10-28 DIAGNOSIS — I2510 Atherosclerotic heart disease of native coronary artery without angina pectoris: Secondary | ICD-10-CM | POA: Diagnosis not present

## 2020-10-28 DIAGNOSIS — R0602 Shortness of breath: Secondary | ICD-10-CM | POA: Insufficient documentation

## 2020-10-28 DIAGNOSIS — Z720 Tobacco use: Secondary | ICD-10-CM | POA: Diagnosis not present

## 2020-10-28 DIAGNOSIS — I5032 Chronic diastolic (congestive) heart failure: Secondary | ICD-10-CM | POA: Diagnosis not present

## 2020-10-28 DIAGNOSIS — R609 Edema, unspecified: Secondary | ICD-10-CM | POA: Diagnosis not present

## 2020-10-28 DIAGNOSIS — I10 Essential (primary) hypertension: Secondary | ICD-10-CM | POA: Diagnosis not present

## 2020-10-28 DIAGNOSIS — I255 Ischemic cardiomyopathy: Secondary | ICD-10-CM | POA: Diagnosis not present

## 2020-10-28 LAB — BRAIN NATRIURETIC PEPTIDE: B Natriuretic Peptide: 101.6 pg/mL — ABNORMAL HIGH (ref 0.0–100.0)

## 2020-11-11 DIAGNOSIS — R7309 Other abnormal glucose: Secondary | ICD-10-CM | POA: Diagnosis not present

## 2020-11-11 DIAGNOSIS — I2510 Atherosclerotic heart disease of native coronary artery without angina pectoris: Secondary | ICD-10-CM | POA: Diagnosis not present

## 2020-11-11 DIAGNOSIS — I10 Essential (primary) hypertension: Secondary | ICD-10-CM | POA: Diagnosis not present

## 2020-11-11 DIAGNOSIS — I255 Ischemic cardiomyopathy: Secondary | ICD-10-CM | POA: Diagnosis not present

## 2020-12-18 DIAGNOSIS — R0602 Shortness of breath: Secondary | ICD-10-CM | POA: Diagnosis not present

## 2020-12-18 DIAGNOSIS — R609 Edema, unspecified: Secondary | ICD-10-CM | POA: Diagnosis not present

## 2020-12-18 DIAGNOSIS — I5032 Chronic diastolic (congestive) heart failure: Secondary | ICD-10-CM | POA: Diagnosis not present

## 2020-12-22 DIAGNOSIS — I2510 Atherosclerotic heart disease of native coronary artery without angina pectoris: Secondary | ICD-10-CM | POA: Diagnosis not present

## 2020-12-22 DIAGNOSIS — J449 Chronic obstructive pulmonary disease, unspecified: Secondary | ICD-10-CM | POA: Diagnosis not present

## 2020-12-22 DIAGNOSIS — I10 Essential (primary) hypertension: Secondary | ICD-10-CM | POA: Diagnosis not present

## 2020-12-22 DIAGNOSIS — I340 Nonrheumatic mitral (valve) insufficiency: Secondary | ICD-10-CM | POA: Diagnosis not present

## 2020-12-22 DIAGNOSIS — Z8709 Personal history of other diseases of the respiratory system: Secondary | ICD-10-CM | POA: Diagnosis not present

## 2020-12-22 DIAGNOSIS — I5032 Chronic diastolic (congestive) heart failure: Secondary | ICD-10-CM | POA: Diagnosis not present

## 2021-01-01 DIAGNOSIS — J449 Chronic obstructive pulmonary disease, unspecified: Secondary | ICD-10-CM | POA: Diagnosis not present

## 2021-01-01 DIAGNOSIS — I5032 Chronic diastolic (congestive) heart failure: Secondary | ICD-10-CM | POA: Diagnosis not present

## 2021-01-01 DIAGNOSIS — I509 Heart failure, unspecified: Secondary | ICD-10-CM | POA: Diagnosis not present

## 2021-01-06 ENCOUNTER — Other Ambulatory Visit: Payer: Self-pay | Admitting: Family Medicine

## 2021-01-06 DIAGNOSIS — R053 Chronic cough: Secondary | ICD-10-CM

## 2021-01-06 DIAGNOSIS — R0602 Shortness of breath: Secondary | ICD-10-CM

## 2021-01-12 DIAGNOSIS — M5414 Radiculopathy, thoracic region: Secondary | ICD-10-CM | POA: Diagnosis not present

## 2021-01-12 DIAGNOSIS — I5032 Chronic diastolic (congestive) heart failure: Secondary | ICD-10-CM | POA: Diagnosis not present

## 2021-01-12 DIAGNOSIS — I10 Essential (primary) hypertension: Secondary | ICD-10-CM | POA: Diagnosis not present

## 2021-01-12 DIAGNOSIS — Z5329 Procedure and treatment not carried out because of patient's decision for other reasons: Secondary | ICD-10-CM | POA: Diagnosis not present

## 2021-01-12 DIAGNOSIS — Z0000 Encounter for general adult medical examination without abnormal findings: Secondary | ICD-10-CM | POA: Diagnosis not present

## 2021-01-12 DIAGNOSIS — J449 Chronic obstructive pulmonary disease, unspecified: Secondary | ICD-10-CM | POA: Diagnosis not present

## 2021-02-06 ENCOUNTER — Ambulatory Visit: Payer: PPO

## 2021-02-24 ENCOUNTER — Ambulatory Visit: Payer: PPO

## 2021-04-21 DIAGNOSIS — J449 Chronic obstructive pulmonary disease, unspecified: Secondary | ICD-10-CM | POA: Diagnosis not present

## 2021-04-21 DIAGNOSIS — I2510 Atherosclerotic heart disease of native coronary artery without angina pectoris: Secondary | ICD-10-CM | POA: Diagnosis not present

## 2021-04-21 DIAGNOSIS — I5032 Chronic diastolic (congestive) heart failure: Secondary | ICD-10-CM | POA: Diagnosis not present

## 2021-04-21 DIAGNOSIS — I10 Essential (primary) hypertension: Secondary | ICD-10-CM | POA: Diagnosis not present

## 2021-04-21 DIAGNOSIS — I739 Peripheral vascular disease, unspecified: Secondary | ICD-10-CM | POA: Diagnosis not present

## 2021-04-21 DIAGNOSIS — F330 Major depressive disorder, recurrent, mild: Secondary | ICD-10-CM | POA: Diagnosis not present

## 2021-04-27 ENCOUNTER — Other Ambulatory Visit: Payer: Self-pay | Admitting: Family Medicine

## 2021-04-27 DIAGNOSIS — Z1231 Encounter for screening mammogram for malignant neoplasm of breast: Secondary | ICD-10-CM

## 2021-05-11 DIAGNOSIS — I739 Peripheral vascular disease, unspecified: Secondary | ICD-10-CM | POA: Diagnosis not present

## 2021-05-25 DIAGNOSIS — J9601 Acute respiratory failure with hypoxia: Secondary | ICD-10-CM | POA: Diagnosis not present

## 2021-05-25 DIAGNOSIS — J441 Chronic obstructive pulmonary disease with (acute) exacerbation: Secondary | ICD-10-CM | POA: Diagnosis not present

## 2021-05-26 ENCOUNTER — Emergency Department: Payer: PPO

## 2021-05-26 ENCOUNTER — Inpatient Hospital Stay
Admission: EM | Admit: 2021-05-26 | Discharge: 2021-05-30 | DRG: 871 | Disposition: A | Discharge status: Home Health | Payer: PPO | Attending: Internal Medicine | Admitting: Internal Medicine

## 2021-05-26 ENCOUNTER — Inpatient Hospital Stay: Payer: PPO

## 2021-05-26 DIAGNOSIS — I700 Atherosclerosis of aorta: Secondary | ICD-10-CM | POA: Diagnosis not present

## 2021-05-26 DIAGNOSIS — Z880 Allergy status to penicillin: Secondary | ICD-10-CM | POA: Diagnosis not present

## 2021-05-26 DIAGNOSIS — E871 Hypo-osmolality and hyponatremia: Secondary | ICD-10-CM | POA: Diagnosis present

## 2021-05-26 DIAGNOSIS — N179 Acute kidney failure, unspecified: Secondary | ICD-10-CM | POA: Diagnosis present

## 2021-05-26 DIAGNOSIS — J101 Influenza due to other identified influenza virus with other respiratory manifestations: Secondary | ICD-10-CM

## 2021-05-26 DIAGNOSIS — F32A Depression, unspecified: Secondary | ICD-10-CM | POA: Diagnosis present

## 2021-05-26 DIAGNOSIS — F410 Panic disorder [episodic paroxysmal anxiety] without agoraphobia: Secondary | ICD-10-CM | POA: Diagnosis present

## 2021-05-26 DIAGNOSIS — R509 Fever, unspecified: Secondary | ICD-10-CM | POA: Diagnosis not present

## 2021-05-26 DIAGNOSIS — Z20822 Contact with and (suspected) exposure to covid-19: Secondary | ICD-10-CM | POA: Diagnosis present

## 2021-05-26 DIAGNOSIS — J439 Emphysema, unspecified: Secondary | ICD-10-CM | POA: Diagnosis not present

## 2021-05-26 DIAGNOSIS — F17210 Nicotine dependence, cigarettes, uncomplicated: Secondary | ICD-10-CM | POA: Diagnosis present

## 2021-05-26 DIAGNOSIS — I10 Essential (primary) hypertension: Secondary | ICD-10-CM | POA: Diagnosis present

## 2021-05-26 DIAGNOSIS — J441 Chronic obstructive pulmonary disease with (acute) exacerbation: Secondary | ICD-10-CM

## 2021-05-26 DIAGNOSIS — J9601 Acute respiratory failure with hypoxia: Secondary | ICD-10-CM

## 2021-05-26 DIAGNOSIS — E785 Hyperlipidemia, unspecified: Secondary | ICD-10-CM | POA: Diagnosis present

## 2021-05-26 DIAGNOSIS — E872 Acidosis, unspecified: Secondary | ICD-10-CM | POA: Diagnosis present

## 2021-05-26 DIAGNOSIS — A419 Sepsis, unspecified organism: Secondary | ICD-10-CM | POA: Diagnosis present

## 2021-05-26 DIAGNOSIS — E860 Dehydration: Secondary | ICD-10-CM | POA: Diagnosis present

## 2021-05-26 DIAGNOSIS — Z66 Do not resuscitate: Secondary | ICD-10-CM | POA: Diagnosis present

## 2021-05-26 DIAGNOSIS — Z8249 Family history of ischemic heart disease and other diseases of the circulatory system: Secondary | ICD-10-CM

## 2021-05-26 DIAGNOSIS — R0602 Shortness of breath: Secondary | ICD-10-CM | POA: Diagnosis not present

## 2021-05-26 DIAGNOSIS — I252 Old myocardial infarction: Secondary | ICD-10-CM

## 2021-05-26 DIAGNOSIS — J9690 Respiratory failure, unspecified, unspecified whether with hypoxia or hypercapnia: Secondary | ICD-10-CM | POA: Diagnosis not present

## 2021-05-26 DIAGNOSIS — J449 Chronic obstructive pulmonary disease, unspecified: Secondary | ICD-10-CM | POA: Diagnosis not present

## 2021-05-26 DIAGNOSIS — M810 Age-related osteoporosis without current pathological fracture: Secondary | ICD-10-CM | POA: Diagnosis present

## 2021-05-26 DIAGNOSIS — R0902 Hypoxemia: Secondary | ICD-10-CM | POA: Diagnosis not present

## 2021-05-26 DIAGNOSIS — J479 Bronchiectasis, uncomplicated: Secondary | ICD-10-CM | POA: Diagnosis not present

## 2021-05-26 HISTORY — DX: Chronic obstructive pulmonary disease, unspecified: J44.9

## 2021-05-26 LAB — HIV ANTIBODY (ROUTINE TESTING W REFLEX): HIV Screen 4th Generation wRfx: NONREACTIVE

## 2021-05-26 LAB — CBC WITH DIFFERENTIAL/PLATELET
Abs Immature Granulocytes: 0.03 10*3/uL (ref 0.00–0.07)
Basophils Absolute: 0 10*3/uL (ref 0.0–0.1)
Basophils Relative: 0 %
Eosinophils Absolute: 0 10*3/uL (ref 0.0–0.5)
Eosinophils Relative: 0 %
HCT: 40 % (ref 36.0–46.0)
Hemoglobin: 12.9 g/dL (ref 12.0–15.0)
Immature Granulocytes: 0 %
Lymphocytes Relative: 8 %
Lymphs Abs: 0.6 10*3/uL — ABNORMAL LOW (ref 0.7–4.0)
MCH: 30.2 pg (ref 26.0–34.0)
MCHC: 32.3 g/dL (ref 30.0–36.0)
MCV: 93.7 fL (ref 80.0–100.0)
Monocytes Absolute: 0.6 10*3/uL (ref 0.1–1.0)
Monocytes Relative: 7 %
Neutro Abs: 7 10*3/uL (ref 1.7–7.7)
Neutrophils Relative %: 85 %
Platelets: 150 10*3/uL (ref 150–400)
RBC: 4.27 MIL/uL (ref 3.87–5.11)
RDW: 14.2 % (ref 11.5–15.5)
Smear Review: NORMAL
WBC: 8.3 10*3/uL (ref 4.0–10.5)
nRBC: 0 % (ref 0.0–0.2)

## 2021-05-26 LAB — COMPREHENSIVE METABOLIC PANEL
ALT: 30 U/L (ref 0–44)
AST: 59 U/L — ABNORMAL HIGH (ref 15–41)
Albumin: 3.8 g/dL (ref 3.5–5.0)
Alkaline Phosphatase: 79 U/L (ref 38–126)
Anion gap: 9 (ref 5–15)
BUN: 17 mg/dL (ref 8–23)
CO2: 23 mmol/L (ref 22–32)
Calcium: 8.3 mg/dL — ABNORMAL LOW (ref 8.9–10.3)
Chloride: 98 mmol/L (ref 98–111)
Creatinine, Ser: 1.16 mg/dL — ABNORMAL HIGH (ref 0.44–1.00)
GFR, Estimated: 50 mL/min — ABNORMAL LOW (ref >60)
Glucose, Bld: 117 mg/dL — ABNORMAL HIGH (ref 70–99)
Potassium: 4.1 mmol/L (ref 3.5–5.1)
Sodium: 130 mmol/L — ABNORMAL LOW (ref 135–145)
Total Bilirubin: 0.6 mg/dL (ref 0.3–1.2)
Total Protein: 6.8 g/dL (ref 6.5–8.1)

## 2021-05-26 LAB — PROCALCITONIN: Procalcitonin: 4.46 ng/mL

## 2021-05-26 LAB — LACTIC ACID, PLASMA
Lactic Acid, Venous: 2.5 mmol/L (ref 0.5–1.9)
Lactic Acid, Venous: 0.8 mmol/L (ref 0.5–1.9)

## 2021-05-26 LAB — RESP PANEL BY RT-PCR (FLU A&B, COVID) ARPGX2
Influenza A by PCR: POSITIVE — AB
Influenza B by PCR: NEGATIVE
SARS Coronavirus 2 by RT PCR: NEGATIVE

## 2021-05-26 LAB — PROTIME-INR
INR: 1.1 (ref 0.8–1.2)
Prothrombin Time: 14.1 seconds (ref 11.4–15.2)

## 2021-05-26 LAB — APTT: aPTT: 36 seconds (ref 24–36)

## 2021-05-26 MED ORDER — OSELTAMIVIR PHOSPHATE 75 MG PO CAPS
75.0000 mg | ORAL_CAPSULE | Freq: Once | ORAL | Status: DC
  Filled 2021-05-26: qty 1

## 2021-05-26 MED ORDER — UMECLIDINIUM BROMIDE 62.5 MCG/ACT IN AEPB
1.0000 | INHALATION_SPRAY | Freq: Every day | RESPIRATORY_TRACT | Status: DC
  Administered 2021-05-27 – 2021-05-30 (×4): 1 via RESPIRATORY_TRACT
  Filled 2021-05-26: qty 7

## 2021-05-26 MED ORDER — SODIUM CHLORIDE 0.9 % IV BOLUS (SEPSIS)
1000.0000 mL | Freq: Once | INTRAVENOUS | Status: AC
  Administered 2021-05-26: 1000 mL via INTRAVENOUS

## 2021-05-26 MED ORDER — FLUTICASONE FUROATE-VILANTEROL 100-25 MCG/ACT IN AEPB
1.0000 | INHALATION_SPRAY | Freq: Every day | RESPIRATORY_TRACT | Status: DC
  Administered 2021-05-27 – 2021-05-30 (×4): 1 via RESPIRATORY_TRACT
  Filled 2021-05-26: qty 28

## 2021-05-26 MED ORDER — ACETAMINOPHEN 325 MG PO TABS
650.0000 mg | ORAL_TABLET | Freq: Four times a day (QID) | ORAL | Status: DC | PRN
  Administered 2021-05-26 – 2021-05-27 (×2): 650 mg via ORAL
  Filled 2021-05-26: qty 2

## 2021-05-26 MED ORDER — METHYLPREDNISOLONE SODIUM SUCC 40 MG IJ SOLR
40.0000 mg | Freq: Two times a day (BID) | INTRAMUSCULAR | Status: DC
Start: 2021-05-27 — End: 2021-05-29
  Administered 2021-05-27 – 2021-05-28 (×4): 40 mg via INTRAVENOUS
  Filled 2021-05-26 (×5): qty 1

## 2021-05-26 MED ORDER — METHYLPREDNISOLONE SODIUM SUCC 125 MG IJ SOLR
125.0000 mg | Freq: Once | INTRAMUSCULAR | Status: AC
  Administered 2021-05-26: 125 mg via INTRAVENOUS

## 2021-05-26 MED ORDER — BUSPIRONE HCL 15 MG PO TABS
15.0000 mg | ORAL_TABLET | Freq: Two times a day (BID) | ORAL | Status: DC
  Filled 2021-05-26 (×6): qty 1
  Filled 2021-05-26: qty 3
  Filled 2021-05-26: qty 1

## 2021-05-26 MED ORDER — OSELTAMIVIR PHOSPHATE 30 MG PO CAPS
30.0000 mg | ORAL_CAPSULE | Freq: Two times a day (BID) | ORAL | Status: DC
  Filled 2021-05-26 (×2): qty 1

## 2021-05-26 MED ORDER — ATORVASTATIN CALCIUM 20 MG PO TABS
40.0000 mg | ORAL_TABLET | Freq: Every evening | ORAL | Status: DC
  Administered 2021-05-27 – 2021-05-29 (×3): 40 mg via ORAL
  Filled 2021-05-26 (×3): qty 2

## 2021-05-26 MED ORDER — ONDANSETRON HCL 4 MG/2ML IJ SOLN
4.0000 mg | Freq: Four times a day (QID) | INTRAMUSCULAR | Status: DC | PRN
  Administered 2021-05-27 – 2021-05-28 (×2): 4 mg via INTRAVENOUS
  Filled 2021-05-26 (×2): qty 2

## 2021-05-26 MED ORDER — ASPIRIN EC 81 MG PO TBEC
81.0000 mg | DELAYED_RELEASE_TABLET | Freq: Every day | ORAL | Status: DC
  Administered 2021-05-27 – 2021-05-30 (×4): 81 mg via ORAL
  Filled 2021-05-26 (×4): qty 1

## 2021-05-26 MED ORDER — SODIUM CHLORIDE 0.9 % IV SOLN
500.0000 mg | INTRAVENOUS | Status: DC
  Administered 2021-05-26 – 2021-05-28 (×3): 500 mg via INTRAVENOUS
  Filled 2021-05-26 (×4): qty 500

## 2021-05-26 MED ORDER — LACTATED RINGERS IV SOLN: INTRAVENOUS | Status: DC

## 2021-05-26 MED ORDER — IPRATROPIUM-ALBUTEROL 0.5-2.5 (3) MG/3ML IN SOLN
3.0000 mL | Freq: Four times a day (QID) | RESPIRATORY_TRACT | Status: DC
Start: 2021-05-26 — End: 2021-05-27
  Administered 2021-05-26 – 2021-05-27 (×4): 3 mL via RESPIRATORY_TRACT
  Filled 2021-05-26 (×3): qty 3

## 2021-05-26 MED ORDER — ESCITALOPRAM OXALATE 10 MG PO TABS
10.0000 mg | ORAL_TABLET | Freq: Every day | ORAL | Status: DC
  Administered 2021-05-27 – 2021-05-30 (×4): 10 mg via ORAL
  Filled 2021-05-26 (×4): qty 1

## 2021-05-26 MED ORDER — IPRATROPIUM-ALBUTEROL 0.5-2.5 (3) MG/3ML IN SOLN
6.0000 mL | Freq: Once | RESPIRATORY_TRACT | Status: AC
  Administered 2021-05-26: 6 mL via RESPIRATORY_TRACT

## 2021-05-26 MED ORDER — OXYCODONE-ACETAMINOPHEN 5-325 MG PO TABS
1.0000 | ORAL_TABLET | Freq: Four times a day (QID) | ORAL | Status: DC | PRN
  Administered 2021-05-28 – 2021-05-30 (×6): 1 via ORAL
  Filled 2021-05-26 (×6): qty 1

## 2021-05-26 MED ORDER — DOCUSATE SODIUM 100 MG PO CAPS
200.0000 mg | ORAL_CAPSULE | Freq: Two times a day (BID) | ORAL | Status: DC
  Administered 2021-05-29: 200 mg via ORAL
  Filled 2021-05-26 (×6): qty 2

## 2021-05-26 MED ORDER — ENOXAPARIN SODIUM 40 MG/0.4ML IJ SOSY
40.0000 mg | PREFILLED_SYRINGE | INTRAMUSCULAR | Status: DC
  Administered 2021-05-27 – 2021-05-29 (×4): 40 mg via SUBCUTANEOUS
  Filled 2021-05-26 (×4): qty 0.4

## 2021-05-26 MED ORDER — MORPHINE SULFATE (PF) 2 MG/ML IV SOLN
1.0000 mg | INTRAVENOUS | Status: DC | PRN
  Administered 2021-05-28: 1 mg via INTRAVENOUS
  Filled 2021-05-26: qty 1

## 2021-05-26 MED ORDER — SODIUM CHLORIDE 0.9 % IV SOLN
2.0000 g | INTRAVENOUS | Status: DC
  Administered 2021-05-26: 2 g via INTRAVENOUS

## 2021-05-26 NOTE — ED Notes (Signed)
Attempted to take patient off Bi-pap and on 5L [place]. Patient's O2 dropped to 83%. RT called and patient placed back on Bi-pap.

## 2021-05-26 NOTE — ED Provider Notes (Signed)
  Emergency Medicine Provider Triage Evaluation Note  Wendy Arias , a 70 y.o.female,  was evaluated in triage.  Pt complains of shortness of breath.  Patient states that she has been short of breath for the past week or so.  Symptoms have progressively been getting worse.  Acrinol clinic today, she desatted to 51s on room air.  Here in triage her SPO2 is 88% on 4 L of O2.  She is tachypneic and tachycardic.  Review of Systems  Positive: Shortness of breath Negative: Denies fever, chest pain, vomiting  Physical Exam   Vitals:   05/26/21 1445 05/26/21 1452  BP:  (!) 77/46  Pulse: 100   Resp: (!) 24   Temp: 99.2 F (37.3 C)   SpO2: (!) 88%    Gen:   Awake, distressed. Resp:  labored breathing. MSK:   Moves extremities without difficulty  Other:    Medical Decision Making  Given the patient's initial medical screening exam, the following diagnostic evaluation has been ordered. The patient will be placed in the appropriate treatment space, once one is available, to complete the evaluation and treatment. I have discussed the plan of care with the patient and I have advised the patient that an ED physician or mid-level practitioner will reevaluate their condition after the test results have been received, as the results may give them additional insight into the type of treatment they may need.    Diagnostics: Labs, CXR, EKG, UA, respiratory panel.  Treatments: Oxygen.   Teodoro Spray, [place] 05/26/21 1453    Vladimir Crofts, MD 05/26/21 [phone]

## 2021-05-26 NOTE — ED Notes (Signed)
Cheri Fowler, MD made aware of pt's BP of 69/54 and O2 of 85% on 5L [place]. RT called for bipap per Cheri Fowler, MD

## 2021-05-26 NOTE — ED Triage Notes (Signed)
Pt to ED from [facility] for shob that started 5 days ago. + fevers. Pt desat to 60s on RA per [facility], pt currently on 4L [place].  Pt labored breathing, unable to speak in complete sentences.

## 2021-05-26 NOTE — Consult Note (Signed)
CODE SEPSIS - PHARMACY COMMUNICATION  **Broad Spectrum Antibiotics should be administered within 1 hour of Sepsis diagnosis**  Time Code Sepsis Called/Page Received: 1522  Antibiotics Ordered: 1522  Time of 1st antibiotic administration: 1543  Additional action taken by pharmacy: N/A  If necessary, Name of Provider/Nurse Contacted: N/A    Darnelle Bos ,PharmD Clinical Pharmacist  05/26/2021  3:27 PM

## 2021-05-26 NOTE — H&P (Addendum)
History and Physical    Wendy Arias:[id number] DOB: [birth year] DOA: 05/26/2021  PCP: Donnamarie Rossetti, PA-C Patient coming from: home     Chief Complaint: shortness of breath   HPI: 70 y/o F w/ PMH of COPD, current smoker, depression, HTN, chronic urinary urgency who presents w/ shortness of breath x 5 days. The shortness of breath is at rest as well as with exertion. Nothing makes the shortness of better or worse. Pt denies hx of CHF. Pt does NOT use oxygen at home. However, pt is current smoker and smokes approx 10 cigarettes a day x 10 years.  Pt also c/o productive intermittent cough w/ clear sputum. Of note, pt has inhalers at home but recently have not been able to afford her inhalers, especially trelegy ellipta. Pt had a subjective fever a couple of days ago. Pt denies any chills, sweating, nausea, vomiting, abd pain, dysuria, urinary frequency, diarrhea or constipation.  Finally, pt has hx of lung surgery approx 3 years for a non-malignant condition at [facility] but cannot say more about why the surgery was done.   Vaccine hx: has not had influenza vaccine  Review of Systems: As per HPI otherwise 14 point review of systems negative.    Past Medical History:  Diagnosis Date   Anxiety    Asthma    COPD (chronic obstructive pulmonary disease) ([place])    Depression    Hypertension    Myocardial infarction [facility])    Osteoporosis     Past Surgical History:  Procedure Laterality Date   ABDOMINAL HYSTERECTOMY     partial   CARDIAC CATHETERIZATION     CARDIAC SURGERY     COLONOSCOPY WITH PROPOFOL N/A 04/12/2017   Procedure: COLONOSCOPY WITH PROPOFOL;  Surgeon: Lollie Sails, MD;  Location: [facility] ENDOSCOPY;  Service: Endoscopy;  Laterality: N/A;   CORONARY ANGIOPLASTY     ENDOBRONCHIAL ULTRASOUND N/A 11/28/2017   Procedure: ENDOBRONCHIAL ULTRASOUND;  Surgeon: Laverle Hobby, MD;  Location: [facility] ORS;  Service: Pulmonary;  Laterality: N/A;   REDUCTION MAMMAPLASTY        reports that she has been smoking cigarettes. She has a 5.00 pack-year smoking history. She has never used smokeless tobacco. She reports current alcohol use of about 3.0 standard drinks per week. She reports that she does not use drugs.  Allergies  Allergen Reactions   Penicillins Other (See Comments)    Has patient had a PCN reaction causing immediate rash, facial/tongue/throat swelling, SOB or lightheadedness with hypotension: Yes Has patient had a PCN reaction causing severe rash involving mucus membranes or skin necrosis: Unknown Has patient had a PCN reaction that required hospitalization: Unknown Has patient had a PCN reaction occurring within the last 10 years: Unknown If all of the above answers are "NO", then may proceed with Cephalosporin use. Pt has taken augmentin before    Family History  Problem Relation Age of Onset   Breast cancer Mother 21   Hypertension Mother    Dementia Mother    Breast cancer Maternal Aunt 87   Breast cancer Other 73   Hypertension Father    Heart disease Father    Diabetes Father    Diabetes Sister      Prior to Admission medications   Medication Sig Start Date End Date Taking? Authorizing Provider  albuterol (PROVENTIL HFA;VENTOLIN HFA) 108 (90 Base) MCG/ACT inhaler Inhale into the lungs every 6 (six) hours as needed for wheezing or shortness of breath.    [provider]  ALPRAZolam (XANAX) 1 MG tablet Take 1 mg by mouth 3 (three) times daily as needed for anxiety.  10/05/17   [provider]  busPIRone (BUSPAR) 15 MG tablet Take 15 mg by mouth 2 (two) times daily at 10 AM and 5 PM. 03/24/18   [provider]  cefTRIAXone 2 g in sodium chloride 0.9 % 100 mL Inject 2 g into the vein daily. 04/14/18   Gladstone Lighter, MD  docusate sodium (COLACE) 100 MG capsule Take 1 capsule (100 mg total) by mouth 2 (two) times daily as needed for mild constipation. 04/14/18   Gladstone Lighter, MD  metoprolol succinate  (TOPROL-XL) 50 MG 24 hr tablet Take 1 tablet (50 mg total) by mouth daily. Take with or immediately following a meal. 04/15/18   Gladstone Lighter, MD  metroNIDAZOLE (FLAGYL) 5-0.79 MG/ML-% IVPB Inject 100 mLs (500 mg total) into the vein every 8 (eight) hours. 04/14/18   Gladstone Lighter, MD  pantoprazole (PROTONIX) 40 MG tablet Take 40 mg by mouth 2 (two) times daily. 02/28/18   [provider]  tiotropium (SPIRIVA) 18 MCG inhalation capsule Place 1 capsule (18 mcg total) into inhaler and inhale daily. Patient not taking: Reported on 03/07/2018 11/22/17 02/16/18  Laverle Hobby, MD  venlafaxine XR (EFFEXOR-XR) 75 MG 24 hr capsule Take 75 mg by mouth daily with breakfast.     [provider]  vitamin B-12 (CYANOCOBALAMIN) 1000 MCG tablet Take 1,000 mcg by mouth daily.    [provider]  vitamin k 100 MCG tablet Take 1 tablet (100 mcg total) by mouth daily. 03/22/18   Laverle Hobby, MD    Physical Exam: Vitals:   05/26/21 1515 05/26/21 1520 05/26/21 1530 05/26/21 1540  BP: (!) 88/60 (!) 62/42 (!) 62/48 115/78  Pulse: 95 91 97 92  Resp: (!) 38 (!) 32 (!) 33 (!) 34  Temp:      TempSrc:      SpO2: 93% 96% 92% 100%  Weight:      Height:        Constitutional: NAD, calm but uncomfortable Vitals:   05/26/21 1515 05/26/21 1520 05/26/21 1530 05/26/21 1540  BP: (!) 88/60 (!) 62/42 (!) 62/48 115/78  Pulse: 95 91 97 92  Resp: (!) 38 (!) 32 (!) 33 (!) 34  Temp:      TempSrc:      SpO2: 93% 96% 92% 100%  Weight:      Height:       Eyes: PERRL, lids and conjunctivae normal ENMT: Mucous membranes are dry Neck: normal, supple Respiratory: course breath sounds b/l. Wheezes b/l  Cardiovascular: S1/S2+. No rubs, clicks or gallops  Abdomen: soft, no tenderness, non-distended. Bowel sounds positive.  Musculoskeletal: no clubbing / cyanosis. No joint deformity upper and lower extremities.  Skin: no rashes, lesions. No induration Neurologic: CN 2-12 grossly  intact. Moves all extremities  Psychiatric: Normal judgment and insight. Alert and oriented x 3. Flat mood    Labs on Admission: I have personally reviewed following labs and imaging studies  CBC: Recent Labs  Lab 05/26/21 1446  WBC 8.3  NEUTROABS 7.0  HGB 12.9  HCT 40.0  MCV 93.7  PLT 962   Basic Metabolic Panel: Recent Labs  Lab 05/26/21 1446  NA 130*  K 4.1  CL 98  CO2 23  GLUCOSE 117*  BUN 17  CREATININE 1.16*  CALCIUM 8.3*   GFR: Estimated Creatinine Clearance: 38.3 mL/min (A) (by C-G formula based on SCr of 1.16 mg/dL (H)).  Liver Function Tests: Recent Labs  Lab 05/26/21 1446  AST 59*  ALT 30  ALKPHOS 79  BILITOT 0.6  PROT 6.8  ALBUMIN 3.8   No results for input(s): LIPASE, AMYLASE in the last 168 hours. No results for input(s): AMMONIA in the last 168 hours. Coagulation Profile: Recent Labs  Lab 05/26/21 1530  INR 1.1   Cardiac Enzymes: No results for input(s): CKTOTAL, CKMB, CKMBINDEX, TROPONINI in the last 168 hours. BNP (last 3 results) No results for input(s): PROBNP in the last 8760 hours. HbA1C: No results for input(s): HGBA1C in the last 72 hours. CBG: No results for input(s): GLUCAP in the last 168 hours. Lipid Profile: No results for input(s): CHOL, HDL, LDLCALC, TRIG, CHOLHDL, LDLDIRECT in the last 72 hours. Thyroid Function Tests: No results for input(s): TSH, T4TOTAL, FREET4, T3FREE, THYROIDAB in the last 72 hours. Anemia Panel: No results for input(s): VITAMINB12, FOLATE, FERRITIN, TIBC, IRON, RETICCTPCT in the last 72 hours. Urine analysis:    Component Value Date/Time   COLORURINE YELLOW (A) 04/13/2018 1338   APPEARANCEUR CLEAR (A) 04/13/2018 1338   LABSPEC 1.034 (H) 04/13/2018 1338   PHURINE 5.0 04/13/2018 1338   GLUCOSEU NEGATIVE 04/13/2018 1338   HGBUR NEGATIVE 04/13/2018 1338   BILIRUBINUR NEGATIVE 04/13/2018 1338   KETONESUR 5 (A) 04/13/2018 1338   PROTEINUR 100 (A) 04/13/2018 1338   NITRITE NEGATIVE 04/13/2018  1338   LEUKOCYTESUR NEGATIVE 04/13/2018 1338    Radiological Exams on Admission: DG Chest Port 1 View  Result Date: 05/26/2021 CLINICAL DATA:  Shortness of breath for 5 days.  Fever.  Hypoxemia. EXAM: PORTABLE CHEST 1 VIEW COMPARISON:  Radiographs 04/09/2018 and 02/16/2018.  CT 04/09/2018. FINDINGS: 1533 hours. Low lung volumes. Allowing for this, the heart size and mediastinal contours are stable. Previously demonstrated right-sided empyema has resolved. There is chronic lung disease with emphysema and central airway thickening, but no focal airspace disease, pneumothorax or significant recurrent pleural effusion identified. The bones appear unremarkable. Telemetry leads overlie the chest. IMPRESSION: Chronic obstructive pulmonary disease with suboptimal inspiration. No acute findings identified. Electronically Signed   By: Richardean Sale M.D.   On: 05/26/2021 15:41    EKG: Independently reviewed.    Assessment/Plan Principal Problem:   COPD exacerbation ([place])    Sepsis: meets criteria w/ tachypnea, tachycardia, elevated lactic acid & possible viral pneumonia. CXR shows COPD exacerbation. CT chest ordered. Pro-cal ordered. Blood cxs pending   COPD exacerbation: continue on IV azithromycin, steroids, bronchodilators, & continue on BiPAP and wean as tolerated. Encourage incentive spirometry when weaned off of BiPAP. May start diet after pt has been weaned off of BiPAP   Influenza: possible viral pneumonia. COVID19 neg was neg. CT chest ordered. Started on tamiflu. Continue w/ supportive care.   Acute hypoxic respiratory failure: likely secondary to COPD exacerbation and possible viral pneumonia. Continue on BiPAP & wean as tolerated. Hx of lung surgery at [facility] approx 3 years ago for non-malignant reason but pt cannot further expand on why the surgery was done.   Current smoker: smokes approx 10 cigarettes/day. Smoking cessation counseling   Hyponatremia: possibly secondary to poor po  intake  Likely AKI: baseline Cr/GFR is unknown. Avoid nephrotoxic meds.   Lactic acidosis: repeat lactic acid ordered. Continue on IVFs   HTN: will hold home dose of metoprolol as BP is low normal  Depression: severity unknown. Continue on home dose of buspar, lexapro   HLD: continue on statin        DVT prophylaxis: lovenox  Code Status: DNR Family Communication: discussed pt's care w/ pt's sister at bedside and answered her questions  Disposition Plan: depends on PT/OT recs (not consulted yet)  Consults called: none Admission status: inpatient/progressive    Wyvonnia Dusky MD [facility]   If 7PM-7AM, please contact night-coverage   05/26/2021, 4:28 PM

## 2021-05-26 NOTE — Progress Notes (Signed)
Wendy Arias Notified by RN patient and family wished to change code status. Bedside patient accompanied by her sister confirmed that she would want CPR/ defibrillation should her heart stop and she would want intubated with ventilatory support at least short term.  Code status changed to full code in [facility]

## 2021-05-26 NOTE — ED Notes (Signed)
Notified Morrison,NP  pt and family at bedside verbalized would like to revoke DNR and become full code pt

## 2021-05-26 NOTE — ED Notes (Signed)
MD at bedside. 

## 2021-05-26 NOTE — Sepsis Progress Note (Signed)
ELink monitoring sepsis protocol 

## 2021-05-26 NOTE — ED Provider Notes (Signed)
[facility] Emergency Department Provider Note   ____________________________________________   Event Date/Time   First MD Initiated Contact with Patient 05/26/21 1502     (approximate)  I have reviewed the triage vital signs and the nursing notes.   HISTORY  Chief Complaint Shortness of Breath    HPI Wendy Arias is a 70 y.o. female who presents for shortness of breath and fever  LOCATION: Chest DURATION: 1 week prior to arrival TIMING: Worsening since onset SEVERITY: Severe QUALITY: Shortness of breath CONTEXT: Patient has history of COPD and has been experiencing worsening shortness of breath with productive cough and fever over the last week MODIFYING FACTORS: Exertion partially relieved exacerbates her shortness of breath and it is partially relieved with submental oxygenation and rest ASSOCIATED SYMPTOMS: Fevers, productive cough   Per medical record review, patient has history of COPD not on oxygen at home          Past Medical History:  Diagnosis Date   Anxiety    Asthma    COPD (chronic obstructive pulmonary disease) ([place])    Depression    Hypertension    Myocardial infarction [facility])    Osteoporosis     Patient Active Problem List   Diagnosis Date Noted   Abnormal pleural fluid    Hepatic cirrhosis ([place])    Anemia of chronic disease    Empyema of right pleural space ([place])    Pneumonia 04/09/2018   Abscess of middle lobe of right lung with pneumonia ([place]) 02/16/2018   Mass of middle lobe of right lung 11/22/2017   Mass of lower lobe of right lung 11/22/2017   Mediastinal adenopathy 11/22/2017   Acute encephalopathy 11/03/2017   Benzodiazepine withdrawal ([place]) 11/03/2017   H/O diarrhea 06/14/2017    Past Surgical History:  Procedure Laterality Date   ABDOMINAL HYSTERECTOMY     partial   CARDIAC CATHETERIZATION     CARDIAC SURGERY     COLONOSCOPY WITH PROPOFOL N/A 04/12/2017   Procedure: COLONOSCOPY WITH PROPOFOL;   Surgeon: Lollie Sails, MD;  Location: [facility] ENDOSCOPY;  Service: Endoscopy;  Laterality: N/A;   CORONARY ANGIOPLASTY     ENDOBRONCHIAL ULTRASOUND N/A 11/28/2017   Procedure: ENDOBRONCHIAL ULTRASOUND;  Surgeon: Laverle Hobby, MD;  Location: [facility] ORS;  Service: Pulmonary;  Laterality: N/A;   REDUCTION MAMMAPLASTY      Prior to Admission medications   Medication Sig Start Date End Date Taking? Authorizing Provider  albuterol (PROVENTIL HFA;VENTOLIN HFA) 108 (90 Base) MCG/ACT inhaler Inhale into the lungs every 6 (six) hours as needed for wheezing or shortness of breath.    [provider]  ALPRAZolam Duanne Moron) 1 MG tablet Take 1 mg by mouth 3 (three) times daily as needed for anxiety.  10/05/17   [provider]  busPIRone (BUSPAR) 15 MG tablet Take 15 mg by mouth 2 (two) times daily at 10 AM and 5 PM. 03/24/18   [provider]  cefTRIAXone 2 g in sodium chloride 0.9 % 100 mL Inject 2 g into the vein daily. 04/14/18   Gladstone Lighter, MD  docusate sodium (COLACE) 100 MG capsule Take 1 capsule (100 mg total) by mouth 2 (two) times daily as needed for mild constipation. 04/14/18   Gladstone Lighter, MD  metoprolol succinate (TOPROL-XL) 50 MG 24 hr tablet Take 1 tablet (50 mg total) by mouth daily. Take with or immediately following a meal. 04/15/18   Gladstone Lighter, MD  metroNIDAZOLE (FLAGYL) 5-0.79 MG/ML-% IVPB Inject 100 mLs (500  mg total) into the vein every 8 (eight) hours. 04/14/18   Gladstone Lighter, MD  pantoprazole (PROTONIX) 40 MG tablet Take 40 mg by mouth 2 (two) times daily. 02/28/18   [provider]  tiotropium (SPIRIVA) 18 MCG inhalation capsule Place 1 capsule (18 mcg total) into inhaler and inhale daily. Patient not taking: Reported on 03/07/2018 11/22/17 02/16/18  Laverle Hobby, MD  venlafaxine XR (EFFEXOR-XR) 75 MG 24 hr capsule Take 75 mg by mouth daily with breakfast.     [provider]  vitamin B-12  (CYANOCOBALAMIN) 1000 MCG tablet Take 1,000 mcg by mouth daily.    [provider]  vitamin k 100 MCG tablet Take 1 tablet (100 mcg total) by mouth daily. 03/22/18   Laverle Hobby, MD    Allergies Penicillins  Family History  Problem Relation Age of Onset   Breast cancer Mother 88   Hypertension Mother    Dementia Mother    Breast cancer Maternal Aunt 60   Breast cancer Other 25   Hypertension Father    Heart disease Father    Diabetes Father    Diabetes Sister     Social History Social History   Tobacco Use   Smoking status: Some Days    Packs/day: 0.25    Years: 20.00    Pack years: 5.00    Types: Cigarettes   Smokeless tobacco: Never  Vaping Use   Vaping Use: Never used  Substance Use Topics   Alcohol use: Yes    Alcohol/week: 3.0 standard drinks    Types: 1 Glasses of wine, 2 Cans of beer per week    Comment: rarely   Drug use: Never    Review of Systems Constitutional: Endorses fever/chills Eyes: No visual changes. ENT: No sore throat. Cardiovascular: Denies chest pain. Respiratory: Endorses shortness of breath. Gastrointestinal: No abdominal pain.  No nausea, no vomiting.  No diarrhea. Genitourinary: Negative for dysuria. Musculoskeletal: Negative for acute arthralgias Skin: Negative for rash. Neurological: Negative for headaches, weakness/numbness/paresthesias in any extremity Psychiatric: Negative for suicidal ideation/homicidal ideation   ____________________________________________   PHYSICAL EXAM:  VITAL SIGNS: ED Triage Vitals  Enc Vitals Group     BP 05/26/21 1452 (!) 77/46     Pulse Rate 05/26/21 1445 100     Resp 05/26/21 1445 (!) 24     Temp 05/26/21 1445 99.2 F (37.3 C)     Temp Source 05/26/21 1445 Oral     SpO2 05/26/21 1445 (!) 88 %     Weight 05/26/21 1446 135 lb (61.2 kg)     Height 05/26/21 1446 5\' 2"  (1.575 m)     Head Circumference --      Peak Flow --      Pain Score 05/26/21 1446 0     Pain Loc --       Pain Edu? --      Excl. in [place]? --    Constitutional: Alert and oriented. Well appearing elderly Caucasian female in mild respiratory distress. Eyes: Conjunctivae are normal. PERRL. Head: Atraumatic. Nose: No congestion/rhinnorhea. Mouth/Throat: Mucous membranes are moist. Neck: No stridor Cardiovascular: Grossly normal heart sounds.  Good peripheral circulation. Respiratory: Increased respiratory effort.  Inspiratory and expiratory wheezes over bilateral lung fields.  BiPAP in place Gastrointestinal: Soft and nontender. No distention. Musculoskeletal: No obvious deformities Neurologic:  Normal speech and language. No gross focal neurologic deficits are appreciated. Skin:  Skin is warm and dry. No rash noted. Psychiatric: Mood and affect are normal. Speech and behavior  are normal.  ____________________________________________   LABS (all labs ordered are listed, but only abnormal results are displayed)  Labs Reviewed  RESP PANEL BY RT-PCR (FLU A&B, COVID) ARPGX2 - Abnormal; Notable for the following components:      Result Value   Influenza A by PCR POSITIVE (*)    All other components within normal limits  LACTIC ACID, PLASMA - Abnormal; Notable for the following components:   Lactic Acid, Venous 2.5 (*)    All other components within normal limits  COMPREHENSIVE METABOLIC PANEL - Abnormal; Notable for the following components:   Sodium 130 (*)    Glucose, Bld 117 (*)    Creatinine, Ser 1.16 (*)    Calcium 8.3 (*)    AST 59 (*)    GFR, Estimated 50 (*)    All other components within normal limits  CBC WITH DIFFERENTIAL/PLATELET - Abnormal; Notable for the following components:   Lymphs Abs 0.6 (*)    All other components within normal limits  CULTURE, BLOOD (ROUTINE X 2)  CULTURE, BLOOD (ROUTINE X 2)  URINE CULTURE  PROTIME-INR  APTT  LACTIC ACID, PLASMA  URINALYSIS, ROUTINE W REFLEX MICROSCOPIC  URINALYSIS, COMPLETE (UACMP) WITH MICROSCOPIC    ____________________________________________  EKG  ED ECG REPORT I, Naaman Plummer, the attending physician, personally viewed and interpreted this ECG.  Date: 05/26/2021 EKG Time: 1453 Rate: 98 Rhythm: normal sinus rhythm QRS Axis: normal Intervals: normal ST/T Wave abnormalities: normal Narrative Interpretation: no evidence of acute ischemia on poor quality EKG  ____________________________________________  RADIOLOGY  ED MD interpretation: One-view portable chest x-ray shows perihilar inflammation with signs of COPD  Official radiology report(s): DG Chest Port 1 View  Result Date: 05/26/2021 CLINICAL DATA:  Shortness of breath for 5 days.  Fever.  Hypoxemia. EXAM: PORTABLE CHEST 1 VIEW COMPARISON:  Radiographs 04/09/2018 and 02/16/2018.  CT 04/09/2018. FINDINGS: 1533 hours. Low lung volumes. Allowing for this, the heart size and mediastinal contours are stable. Previously demonstrated right-sided empyema has resolved. There is chronic lung disease with emphysema and central airway thickening, but no focal airspace disease, pneumothorax or significant recurrent pleural effusion identified. The bones appear unremarkable. Telemetry leads overlie the chest. IMPRESSION: Chronic obstructive pulmonary disease with suboptimal inspiration. No acute findings identified. Electronically Signed   By: Richardean Sale M.D.   On: 05/26/2021 15:41    ____________________________________________   PROCEDURES  Procedure(s) performed (including Critical Care):  .1-3 Lead EKG Interpretation Performed by: Naaman Plummer, MD Authorized by: Naaman Plummer, MD     Interpretation: normal     ECG rate:  91   ECG rate assessment: normal     Rhythm: sinus rhythm     Ectopy: none     Conduction: normal    CRITICAL CARE Performed by: Naaman Plummer   Total critical care time: 33 minutes  Critical care time was exclusive of separately billable procedures and treating other  patients.  Critical care was necessary to treat or prevent imminent or life-threatening deterioration.  Critical care was time spent personally by me on the following activities: development of treatment plan with patient and/or surrogate as well as nursing, discussions with consultants, evaluation of patient's response to treatment, examination of patient, obtaining history from patient or surrogate, ordering and performing treatments and interventions, ordering and review of laboratory studies, ordering and review of radiographic studies, pulse oximetry and re-evaluation of patient's condition.  ____________________________________________   INITIAL IMPRESSION / ASSESSMENT AND PLAN / ED COURSE  As  part of my medical decision making, I reviewed the following data within the electronic medical record, if available:  Nursing notes reviewed and incorporated, Labs reviewed, EKG interpreted, Old chart reviewed, Radiograph reviewed and Notes from prior ED visits reviewed and incorporated      The patient appears to be suffering from a moderate/severe exacerbation of COPD.  Based on the history, exam, CXR/EKG reviewed by me, and further workup I dont suspect any other emergent cause of this presentation, such as pneumonia, acute coronary syndrome, congestive heart failure, pulmonary embolism, or pneumothorax.  ED Interventions: bronchodilators, steroids, antibiotics, reassess Patient has tested positive for influenza A Reassessment: After treatment, the patients shortness of breath is improving but patient is still requiring supplemental oxygenation with BiPAP  Disposition: Admit      ____________________________________________   FINAL CLINICAL IMPRESSION(S) / ED DIAGNOSES  Final diagnoses:  COPD exacerbation ([place])  Acute respiratory failure with hypoxia ([place])  Influenza A     ED Discharge Orders     None        Note:  This document was prepared using Dragon voice  recognition software and may include unintentional dictation errors.    Naaman Plummer, MD 05/26/21 [phone]

## 2021-05-26 NOTE — ED Notes (Signed)
Attempted to take patient off Bi-pap and on 5L [place]. Patient's O2 dropped to 83%. RT called and patient placed back on Bi-pap.

## 2021-05-27 ENCOUNTER — Encounter: Payer: Self-pay | Admitting: Internal Medicine

## 2021-05-27 ENCOUNTER — Other Ambulatory Visit: Payer: Self-pay

## 2021-05-27 LAB — CBC
HCT: 35.6 % — ABNORMAL LOW (ref 36.0–46.0)
Hemoglobin: 11.5 g/dL — ABNORMAL LOW (ref 12.0–15.0)
MCH: 29.9 pg (ref 26.0–34.0)
MCHC: 32.3 g/dL (ref 30.0–36.0)
MCV: 92.5 fL (ref 80.0–100.0)
Platelets: 136 10*3/uL — ABNORMAL LOW (ref 150–400)
RBC: 3.85 MIL/uL — ABNORMAL LOW (ref 3.87–5.11)
RDW: 14.1 % (ref 11.5–15.5)
WBC: 8.7 10*3/uL (ref 4.0–10.5)
nRBC: 0 % (ref 0.0–0.2)

## 2021-05-27 LAB — COMPREHENSIVE METABOLIC PANEL
ALT: 32 U/L (ref 0–44)
AST: 67 U/L — ABNORMAL HIGH (ref 15–41)
Albumin: 3.2 g/dL — ABNORMAL LOW (ref 3.5–5.0)
Alkaline Phosphatase: 66 U/L (ref 38–126)
Anion gap: 4 — ABNORMAL LOW (ref 5–15)
BUN: 14 mg/dL (ref 8–23)
CO2: 24 mmol/L (ref 22–32)
Calcium: 7.9 mg/dL — ABNORMAL LOW (ref 8.9–10.3)
Chloride: 107 mmol/L (ref 98–111)
Creatinine, Ser: 0.72 mg/dL (ref 0.44–1.00)
GFR, Estimated: >60 mL/min (ref >60)
Glucose, Bld: 146 mg/dL — ABNORMAL HIGH (ref 70–99)
Potassium: 4.2 mmol/L (ref 3.5–5.1)
Sodium: 135 mmol/L (ref 135–145)
Total Bilirubin: 0.4 mg/dL (ref 0.3–1.2)
Total Protein: 6.1 g/dL — ABNORMAL LOW (ref 6.5–8.1)

## 2021-05-27 LAB — MAGNESIUM: Magnesium: 2 mg/dL (ref 1.7–2.4)

## 2021-05-27 LAB — PROCALCITONIN: Procalcitonin: 3.79 ng/mL

## 2021-05-27 MED ORDER — VITAMIN B-12 1000 MCG PO TABS
1000.0000 ug | ORAL_TABLET | Freq: Every day | ORAL | Status: DC
  Administered 2021-05-27 – 2021-05-30 (×4): 1000 ug via ORAL
  Filled 2021-05-27 (×4): qty 1

## 2021-05-27 MED ORDER — SODIUM CHLORIDE 0.9 % IV SOLN: INTRAVENOUS | Status: DC | PRN

## 2021-05-27 MED ORDER — IPRATROPIUM-ALBUTEROL 0.5-2.5 (3) MG/3ML IN SOLN
3.0000 mL | Freq: Four times a day (QID) | RESPIRATORY_TRACT | Status: DC | PRN
  Administered 2021-05-28: 3 mL via RESPIRATORY_TRACT
  Filled 2021-05-27: qty 3

## 2021-05-27 MED ORDER — OSELTAMIVIR PHOSPHATE 75 MG PO CAPS
75.0000 mg | ORAL_CAPSULE | Freq: Once | ORAL | Status: AC
  Administered 2021-05-27: 75 mg via ORAL
  Filled 2021-05-27: qty 1

## 2021-05-27 MED ORDER — BUSPIRONE HCL 15 MG PO TABS: 15.0000 mg | ORAL_TABLET | Freq: Two times a day (BID) | ORAL | Status: DC

## 2021-05-27 MED ORDER — MAGNESIUM OXIDE -MG SUPPLEMENT 400 (240 MG) MG PO TABS
400.0000 mg | ORAL_TABLET | Freq: Every day | ORAL | Status: DC
  Administered 2021-05-27 – 2021-05-30 (×4): 400 mg via ORAL
  Filled 2021-05-27 (×4): qty 1

## 2021-05-27 MED ORDER — NICOTINE 21 MG/24HR TD PT24
21.0000 mg | MEDICATED_PATCH | Freq: Every day | TRANSDERMAL | Status: DC
  Administered 2021-05-27 – 2021-05-30 (×4): 21 mg via TRANSDERMAL
  Filled 2021-05-27 (×4): qty 1

## 2021-05-27 MED ORDER — ATORVASTATIN CALCIUM 20 MG PO TABS: 40.0000 mg | ORAL_TABLET | Freq: Every day | ORAL | Status: DC

## 2021-05-27 MED ORDER — ESCITALOPRAM OXALATE 10 MG PO TABS: 10.0000 mg | ORAL_TABLET | Freq: Every day | ORAL | Status: DC

## 2021-05-27 MED ORDER — OSELTAMIVIR PHOSPHATE 30 MG PO CAPS
30.0000 mg | ORAL_CAPSULE | Freq: Two times a day (BID) | ORAL | Status: DC
  Administered 2021-05-28 – 2021-05-30 (×5): 30 mg via ORAL
  Filled 2021-05-27 (×6): qty 1

## 2021-05-27 MED ORDER — METOPROLOL SUCCINATE ER 25 MG PO TB24
25.0000 mg | ORAL_TABLET | Freq: Every day | ORAL | Status: DC
  Administered 2021-05-27 – 2021-05-30 (×4): 25 mg via ORAL
  Filled 2021-05-27 (×4): qty 1

## 2021-05-27 MED ORDER — SODIUM CHLORIDE 0.9% FLUSH
3.0000 mL | Freq: Two times a day (BID) | INTRAVENOUS | Status: DC
  Administered 2021-05-27 – 2021-05-29 (×6): 3 mL via INTRAVENOUS

## 2021-05-27 MED ORDER — FLUTICASONE-UMECLIDIN-VILANT 100-62.5-25 MCG/ACT IN AEPB: 1.0000 | INHALATION_SPRAY | Freq: Every day | RESPIRATORY_TRACT | Status: DC

## 2021-05-27 MED ORDER — ALBUTEROL SULFATE HFA 108 (90 BASE) MCG/ACT IN AERS
2.0000 | INHALATION_SPRAY | Freq: Four times a day (QID) | RESPIRATORY_TRACT | Status: DC | PRN
  Filled 2021-05-27: qty 6.7

## 2021-05-27 MED ORDER — ASPIRIN 325 MG PO TABS: 325.0000 mg | ORAL_TABLET | Freq: Every day | ORAL | Status: DC

## 2021-05-27 MED ORDER — ALBUTEROL SULFATE (2.5 MG/3ML) 0.083% IN NEBU: 2.5000 mL | INHALATION_SOLUTION | Freq: Four times a day (QID) | RESPIRATORY_TRACT | Status: DC | PRN

## 2021-05-27 MED ORDER — ALBUTEROL SULFATE (2.5 MG/3ML) 0.083% IN NEBU
2.5000 mg | INHALATION_SOLUTION | Freq: Four times a day (QID) | RESPIRATORY_TRACT | Status: DC | PRN
  Filled 2021-05-27: qty 3

## 2021-05-27 NOTE — Progress Notes (Signed)
  Chaplain On-Call responded to Spiritual Care Consult Order from RN Ria Bush for :"create or update advance directive".  Chaplain attempted to visit but learned from Staff that the patient has severe influenza; Staff advised Chaplain not to visit at this time.  Chaplain gave AD documents to Unit Secretary for the Nurse to deliver to the patient. Chaplain called the patient's room and spoke with her son Thurmond Butts by phone. Thurmond Butts will convey this information to the patient.  Chaplains are available for further consultation as needed.  Chaplain Pollyann Samples M.Div., [facility]

## 2021-05-27 NOTE — Progress Notes (Signed)
[facility] NAME: Wendy Arias    MR#:  [id number]  DATE OF BIRTH:  10/01/1950  SUBJECTIVE:  patient son at bedside. Came in with increasing shortness of breath after Thanksgiving. Had family members visit at home started filling short of breath cough. Chronic smoker. Does not wear oxygen at home. Currently on 6 high flow nasal cannula oxygen. Weaned off BiPAP this morning.  REVIEW OF SYSTEMS:   Review of Systems  Constitutional:  Negative for chills, fever and weight loss.  HENT:  Negative for ear discharge, ear pain and nosebleeds.   Eyes:  Negative for blurred vision, pain and discharge.  Respiratory:  Negative for sputum production, shortness of breath, wheezing and stridor.   Cardiovascular:  Negative for chest pain, palpitations, orthopnea and PND.  Gastrointestinal:  Negative for abdominal pain, diarrhea, nausea and vomiting.  Genitourinary:  Negative for frequency and urgency.  Musculoskeletal:  Negative for back pain and joint pain.  Neurological:  Negative for sensory change, speech change, focal weakness and weakness.  Psychiatric/Behavioral:  Negative for depression and hallucinations. The patient is not nervous/anxious.   Tolerating Diet:yes Tolerating PT: pending  DRUG ALLERGIES:   Allergies  Allergen Reactions   Penicillins Other (See Comments)    Has patient had a PCN reaction causing immediate rash, facial/tongue/throat swelling, SOB or lightheadedness with hypotension: Yes Has patient had a PCN reaction causing severe rash involving mucus membranes or skin necrosis: Unknown Has patient had a PCN reaction that required hospitalization: Unknown Has patient had a PCN reaction occurring within the last 10 years: Unknown If all of the above answers are "NO", then may proceed with Cephalosporin use. Pt has taken augmentin before    VITALS:  Blood pressure 115/74, pulse 83, temperature 97.7 F (36.5 C), temperature  source Oral, resp. rate (!) 24, height 5\' 2"  (1.575 m), weight 61.2 kg, SpO2 98 %.  PHYSICAL EXAMINATION:   Physical Exam  GENERAL:  70 y.o.-year-old patient lying in the bed with no acute distress. weak HEENT: Head atraumatic, normocephalic. Oropharynx and nasopharynx clear.  LUNGS: coarse breath sounds bilaterally, no wheezing, rales, rhonchi. No use of accessory muscles of respiration.  CARDIOVASCULAR: S1, S2 normal. No murmurs, rubs, or gallops.  ABDOMEN: Soft, nontender, nondistended. Bowel sounds present. No organomegaly or mass.  EXTREMITIES: No cyanosis, clubbing or edema b/l.    NEUROLOGIC: nonfocal PSYCHIATRIC:  patient is alert and oriented x 3.  SKIN: No obvious rash, lesion, or ulcer.   LABORATORY PANEL:  CBC Recent Labs  Lab 05/27/21 0704  WBC 8.7  HGB 11.5*  HCT 35.6*  PLT 136*    Chemistries  Recent Labs  Lab 05/27/21 0704  NA 135  K 4.2  CL 107  CO2 24  GLUCOSE 146*  BUN 14  CREATININE 0.72  CALCIUM 7.9*  MG 2.0  AST 67*  ALT 32  ALKPHOS 66  BILITOT 0.4   Cardiac Enzymes No results for input(s): TROPONINI in the last 168 hours. RADIOLOGY:  CT CHEST WO CONTRAST  Result Date: 05/26/2021 CLINICAL DATA:  Respiratory failure. EXAM: CT CHEST WITHOUT CONTRAST TECHNIQUE: Multidetector CT imaging of the chest was performed following the standard protocol without IV contrast. COMPARISON:  Chest radiograph dated 05/26/2021. CT dated 04/09/2018. FINDINGS: Evaluation of this exam is limited in the absence of intravenous contrast. Cardiovascular: There is no cardiomegaly or pericardial effusion. There is coronary vascular calcification. Moderate atherosclerotic calcification of the thoracic aorta. No aneurysmal dilatation. The  central pulmonary arteries are grossly unremarkable. No hilar or mediastinal adenopathy. Mediastinum/Nodes: The esophagus is grossly unremarkable. No mediastinal fluid collection. Lungs/Pleura: Bilateral lower lobe predominant streaky and  scattered nodular densities most consistent with pneumonia, possibly viral or atypical in etiology. There is mild bibasilar bronchiectasis. There is no pleural effusion or pneumothorax. The central airways are patent. Upper Abdomen: No acute abnormality. Musculoskeletal: 26 old L1 compression fracture with greater than 50% loss of vertebral body height. There is 4 mm retropulsion of the superior posterior cortex. No acute osseous pathology. IMPRESSION: 1. Bilateral lower lobe predominant streaky and scattered nodular densities most consistent with pneumonia, possibly viral or atypical in etiology. 2. Old L1 compression fracture. 3. Aortic Atherosclerosis (ICD10-I70.0). Electronically Signed   By: Anner Crete M.D.   On: 05/26/2021 18:16   DG Chest Port 1 View  Result Date: 05/26/2021 CLINICAL DATA:  Shortness of breath for 5 days.  Fever.  Hypoxemia. EXAM: PORTABLE CHEST 1 VIEW COMPARISON:  Radiographs 04/09/2018 and 02/16/2018.  CT 04/09/2018. FINDINGS: 1533 hours. Low lung volumes. Allowing for this, the heart size and mediastinal contours are stable. Previously demonstrated right-sided empyema has resolved. There is chronic lung disease with emphysema and central airway thickening, but no focal airspace disease, pneumothorax or significant recurrent pleural effusion identified. The bones appear unremarkable. Telemetry leads overlie the chest. IMPRESSION: Chronic obstructive pulmonary disease with suboptimal inspiration. No acute findings identified. Electronically Signed   By: Richardean Sale M.D.   On: 05/26/2021 15:41   ASSESSMENT AND PLAN:   70 y/o F w/ PMH of COPD, current smoker, depression, HTN, chronic urinary urgency who presents w/ shortness of breath x 5 days. The shortness of breath is at rest as well as with exertion. Nothing makes the shortness of better or worse. Pt denies hx of CHF. Pt does NOT use oxygen at home. However, pt is current smoker and smokes approx 10 cigarettes a day x 10  years.  Pt also c/o productive intermittent cough w/ clear sputum.   Acute hypoxic respiratory failure secondary to COPD exacerbation with history of excessive tobacco abuse in the setting of influenza A infection -- patient presented with shortness of breath sats 85% on 5 L nasal cannula. Patient had to be placed on BiPAP now we know --  currently on 6 L high flow nasal cannula oxygen. -- Wean as tolerated -- continue current bronchodilators and duo nebs -- empiric Zithromax given poor lung condition and elevated Pro calcitonin -- continue Tamiflu -- continue steroids -- incentive spirometer  tobacco abuse/current smoker -- patient advised smoking cessation  Depression -- continue BuSpar and Lexapro  hyperlipidemia continue statins  hyponatremia likely due to dehydration AK I -- resolved with IV fluids    Procedures: Family communication : son at bedside Consults : CODE STATUS: full DVT Prophylaxis : enoxaparin Level of care: Progressive Status is: Inpatient  Remains inpatient appropriate because: hypoxic respiratory failure secondary to COPD and influenza A        TOTAL TIME TAKING CARE OF THIS PATIENT: 30 minutes.  >50% time spent on counselling and coordination of care  Note: This dictation was prepared with Dragon dictation along with smaller phrase technology. Any transcriptional errors that result from this process are unintentional.  Fritzi Mandes M.D    [facility]   CC: Primary care physician; Donnamarie Rossetti, PA-C Patient ID: Wendy Arias, female   DOB: 24-Jul-1950, 70 y.o.   MRN: [id number]

## 2021-05-27 NOTE — Evaluation (Signed)
Physical Therapy Evaluation Patient Details Name: Wendy Arias MRN: [id number] DOB: 1950/07/26 Today's Date: 05/27/2021  History of Present Illness  70 y/o F w/ PMH of COPD, current smoker, depression, HTN, chronic urinary urgency who presents w/ shortness of breath x 5 days. The shortness of breath is at rest as well as with exertion.  Clinical Impression  Pt received supine in bed, required encouragement by PT and family member to participate with PT evaluation. Pt became irritated and somewhat impulsive once mobility was initiated. Pt sat to EOB and refused further mobility due to SOB. Vitals monitored - SpO2 decreased to 88% with bed mobility and improved to 92% with VC on pursed lip breathing; HR elevated to high 90's. PT provided education on importance of frequent mobility, out of bed mobility, and sitting upright to allow lungs to expand for increased oxygenation. PT would like to assess further mobility including STS and ambulation. Would benefit from skilled PT to address above deficits and promote optimal return to PLOF.      Recommendations for follow up therapy are one component of a multi-disciplinary discharge planning process, led by the attending physician.  Recommendations may be updated based on patient status, additional functional criteria and insurance authorization.  Follow Up Recommendations Home health PT    Assistance Recommended at Discharge Intermittent Supervision/Assistance  Functional Status Assessment Patient has had a recent decline in their functional status and demonstrates the ability to make significant improvements in function in a reasonable and predictable amount of time.  Equipment Recommendations  None recommended by PT    Recommendations for Other Services       Precautions / Restrictions Precautions Precautions: Fall Restrictions Weight Bearing Restrictions: No      Mobility  Bed Mobility Overal bed mobility: Needs Assistance Bed  Mobility: Supine to Sit;Sit to Supine     Supine to sit: Supervision;HOB elevated Sit to supine: Supervision;HOB elevated   General bed mobility comments: SUP for safety. Increased time and effort    Transfers                   General transfer comment: deferred - pt refused    Ambulation/Gait                  Stairs            Wheelchair Mobility    Modified Rankin (Stroke Patients Only)       Balance Overall balance assessment: Needs assistance Sitting-balance support: Bilateral upper extremity supported;Feet supported Sitting balance-Leahy Scale: Fair Sitting balance - Comments: posterior lean onto BUE. Was able to remove 1 UE to allow PT to read pulse ox Postural control: Posterior lean     Standing balance comment: pt refused                             Pertinent Vitals/Pain Pain Assessment: No/denies pain    Home Living Family/patient expects to be discharged to:: Private residence Living Arrangements: Children Available Help at Discharge: Family;Available PRN/intermittently Type of Home: House Home Access: Level entry       Home Layout: One level Home Equipment: Conservation officer, nature (2 wheels);BSC/3in1 Additional Comments: does not use AD at baseline    Prior Function Prior Level of Function : Independent/Modified Independent             Mobility Comments: Independent with household distance ambulation.       Hand Dominance  Extremity/Trunk Assessment   Upper Extremity Assessment Upper Extremity Assessment: Generalized weakness    Lower Extremity Assessment Lower Extremity Assessment: Generalized weakness       Communication   Communication: No difficulties  Cognition Arousal/Alertness: Awake/alert Behavior During Therapy: WFL for tasks assessed/performed Overall Cognitive Status: Within Functional Limits for tasks assessed                                 General Comments: A&Ox4.  Somewhat irritable, pt stating due to not feeling well.        General Comments      Exercises     Assessment/Plan    PT Assessment Patient needs continued PT services  PT Problem List Decreased strength;Decreased mobility;Decreased range of motion;Decreased safety awareness;Decreased activity tolerance;Cardiopulmonary status limiting activity;Decreased balance       PT Treatment Interventions DME instruction;Therapeutic activities;Gait training;Therapeutic exercise;Patient/family education;Balance training;Functional mobility training;Neuromuscular re-education    PT Goals (Current goals can be found in the Care Plan section)  Acute Rehab PT Goals Patient Stated Goal: to go home PT Goal Formulation: With patient Time For Goal Achievement: 06/10/21 Potential to Achieve Goals: Good    Frequency Min 2X/week   Barriers to discharge        Co-evaluation               AM-PAC PT "6 Clicks" Mobility  Outcome Measure Help needed turning from your back to your side while in a flat bed without using bedrails?: A Little Help needed moving from lying on your back to sitting on the side of a flat bed without using bedrails?: A Little Help needed moving to and from a bed to a chair (including a wheelchair)?: A Little Help needed standing up from a chair using your arms (e.g., wheelchair or bedside chair)?: A Little Help needed to walk in hospital room?: A Lot Help needed climbing 3-5 steps with a railing? : A Lot 6 Click Score: 16    End of Session Equipment Utilized During Treatment: Oxygen Activity Tolerance: Patient limited by fatigue;Patient tolerated treatment well Patient left: in bed;with call bell/phone within reach;with family/visitor present Nurse Communication: Mobility status PT Visit Diagnosis: Muscle weakness (generalized) (M62.81);Difficulty in walking, not elsewhere classified (R26.2)    Time: 0160-1093 PT Time Calculation (min) (ACUTE ONLY): 13  min   Charges:   PT Evaluation $PT Eval Moderate Complexity: 1 Mod          Dynasti Kerman PT, DPT 05/27/21 5:30 PM [phone]

## 2021-05-27 NOTE — Progress Notes (Signed)
FIO2 weaned to 40% on Bipap. Patient transitioned from Bipap to 10L High Flow Nasal Cannula at this time. Tolerating well, will continue to monitor.

## 2021-05-27 NOTE — Progress Notes (Signed)
Initial Nutrition Assessment  DOCUMENTATION CODES:   Not applicable  INTERVENTION:   -RD will follow for diet advancement and add supplements as appropriate  NUTRITION DIAGNOSIS:   Increased nutrient needs related to chronic illness (COPD) as evidenced by estimated needs.  GOAL:   Patient will meet greater than or equal to 90% of their needs  MONITOR:   PO intake, Supplement acceptance, Diet advancement, Labs, Weight trends, Skin, I & O's  REASON FOR ASSESSMENT:   Consult Assessment of nutrition requirement/status  ASSESSMENT:   70 y/o F w/ PMH of COPD, current smoker, depression, HTN, chronic urinary urgency who presents w/ shortness of breath x 5 days. The shortness of breath is at rest as well as with exertion. Nothing makes the shortness of better or worse. Pt denies hx of CHF. Pt does NOT use oxygen at home. However, pt is current smoker and smokes approx 10 cigarettes a day x 10 years.  Pt also c/o productive intermittent cough w/ clear sputum. Of note, pt has inhalers at home but recently have not been able to afford her inhalers, especially trelegy ellipta. Pt had a subjective fever a couple of days ago. Pt denies any chills, sweating, nausea, vomiting, abd pain, dysuria, urinary frequency, diarrhea or constipation.  Finally, pt has hx of lung surgery approx 3 years for a non-malignant condition at [facility] but cannot say more about why the surgery was done.  Pt admitted with sepsis and COPD exacerbation.   11/29- transitioned to full code  Reviewed I/O's: +2.3 L x 24 hours  Pt unavailable at time of visit. RD unable to obtain further nutrition-related history or complete nutrition-focused physical exam at this time.    Pt currently NPO.    Reviewed wt hx; no wt loss noted.   When pt has transitioned to an oral diet, she would benefit from addition of oral nutrition supplements.   Medications reviewed and include colace, solu-medrol, and lactated ringers infusion @ 75  ml/hr.   Labs reviewed.   Diet Order:   Diet Order             Diet NPO time specified  Diet effective now                   EDUCATION NEEDS:   No education needs have been identified at this time  Skin:  Skin Assessment: Reviewed RN Assessment  Last BM:  Unknown  Height:   Ht Readings from Last 1 Encounters:  05/26/21 5\' 2"  (1.575 m)    Weight:   Wt Readings from Last 1 Encounters:  05/26/21 61.2 kg    Ideal Body Weight:  50 kg  BMI:  Body mass index is 24.69 kg/m.  Estimated Nutritional Needs:   Kcal:  1850-2050  Protein:  90-105 grams  Fluid:  > 1.8 L    Loistine Chance, RD, LDN, [place] Registered Dietitian II Certified Diabetes Care and Education Specialist Please refer to [facility] for RD and/or RD on-call/weekend/after hours pager

## 2021-05-28 LAB — PROCALCITONIN: Procalcitonin: 2.63 ng/mL

## 2021-05-28 LAB — BRAIN NATRIURETIC PEPTIDE: B Natriuretic Peptide: 300.3 pg/mL — ABNORMAL HIGH (ref 0.0–100.0)

## 2021-05-28 MED ORDER — ADULT MULTIVITAMIN W/MINERALS CH
1.0000 | ORAL_TABLET | Freq: Every day | ORAL | Status: DC
  Administered 2021-05-30: 1 via ORAL
  Filled 2021-05-28 (×2): qty 1

## 2021-05-28 MED ORDER — FUROSEMIDE 10 MG/ML IJ SOLN
20.0000 mg | Freq: Two times a day (BID) | INTRAMUSCULAR | Status: AC
  Administered 2021-05-28 – 2021-05-29 (×3): 20 mg via INTRAVENOUS
  Filled 2021-05-28 (×3): qty 2

## 2021-05-28 MED ORDER — FUROSEMIDE 10 MG/ML IJ SOLN: 20.0000 mg | Freq: Two times a day (BID) | INTRAMUSCULAR | Status: DC

## 2021-05-28 MED ORDER — ENSURE ENLIVE PO LIQD
237.0000 mL | Freq: Two times a day (BID) | ORAL | Status: DC
  Administered 2021-05-29 – 2021-05-30 (×3): 237 mL via ORAL

## 2021-05-28 MED ORDER — HYDROXYZINE HCL 25 MG PO TABS
25.0000 mg | ORAL_TABLET | Freq: Three times a day (TID) | ORAL | Status: DC | PRN
  Administered 2021-05-28 – 2021-05-29 (×2): 25 mg via ORAL
  Filled 2021-05-28 (×2): qty 1

## 2021-05-28 NOTE — Progress Notes (Addendum)
Pt sitting on side of bed with bed alarm sounding.  Reports "I'm having a panic attack and need a Xanax.  Pt tachypneic at 30/min. HFNC off and removed by patient at this time.  Argumentative with nurse with attempts to reapply oxygen.  Saturations at 82% on room air. Informed patient that oxygen would help her breathing but she kept requesting "I need a Xanax".  No Xanax on profile or med list. Compliant with staff after Morphine offered to calm breathing. Oxygen reapplied and Bipap offered. Respiratory  notified to come assess Bipap machine and settings.

## 2021-05-28 NOTE — Progress Notes (Signed)
PT Cancellation Note  Patient Details Name: Wendy Arias MRN: [id number] DOB: 02/05/1951   Cancelled Treatment:    Reason Eval/Treat Not Completed: Patient declined, no reason specified;Medical issues which prohibited therapy. Chart reviewed. Spoke to RN and received approval prior to both treatment attempts this date. Pt reporting SOB and coughing that lead to anxiety - pt states mobility will increase these symptoms and therefore refuses participation with therapy this date. PT will continue to follow and attempt treatment at later date.    Patrina Levering PT, DPT 05/28/21 3:45 PM [phone]

## 2021-05-28 NOTE — Progress Notes (Signed)
Nutrition Follow-up  DOCUMENTATION CODES:   Not applicable  INTERVENTION:   -Ensure Enlive po BID, each supplement provides 350 kcal and 20 grams of protein  -MVI with minerals daily  NUTRITION DIAGNOSIS:   Increased nutrient needs related to chronic illness (COPD) as evidenced by estimated needs.  Ongoing  GOAL:   Patient will meet greater than or equal to 90% of their needs  Progressing   MONITOR:   PO intake, Supplement acceptance, Diet advancement, Labs, Weight trends, Skin, I & O's  REASON FOR ASSESSMENT:   Consult Assessment of nutrition requirement/status  ASSESSMENT:   70 y/o F w/ PMH of COPD, current smoker, depression, HTN, chronic urinary urgency who presents w/ shortness of breath x 5 days. The shortness of breath is at rest as well as with exertion. Nothing makes the shortness of better or worse. Pt denies hx of CHF. Pt does NOT use oxygen at home. However, pt is current smoker and smokes approx 10 cigarettes a day x 10 years.  Pt also c/o productive intermittent cough w/ clear sputum. Of note, pt has inhalers at home but recently have not been able to afford her inhalers, especially trelegy ellipta. Pt had a subjective fever a couple of days ago. Pt denies any chills, sweating, nausea, vomiting, abd pain, dysuria, urinary frequency, diarrhea or constipation.  Finally, pt has hx of lung surgery approx 3 years for a non-malignant condition at [facility] but cannot say more about why the surgery was done.  11/29- transitioned to full code  Reviewed I/O's: +2.3 L x 24 hours and +4.6 L since admission   Spoke with pt and sister at bedside. Pt reports feeling a little better today has consumed pancakes for breakfast. Pt shares that she ate very little for approximately 2-3 days PTA secondary to feeling unwell. At baseline, she has a very hearty appetite- consuming 3 meals and snacks daily. Noted documented meal completions 30%.   Per pt, she has not had any weight loss,  but has gained about 12# in the past few months secondary to edema.   Discussed importance of good meal and supplement intake to promote healing.   Medications reviewed and include colace, magnesium oxide, and vitamin B-12.   Labs reviewed.   NUTRITION - FOCUSED PHYSICAL EXAM:  Flowsheet Row Most Recent Value  Orbital Region No depletion  Upper Arm Region No depletion  Thoracic and Lumbar Region No depletion  Buccal Region No depletion  Temple Region No depletion  Clavicle Bone Region No depletion  Clavicle and Acromion Bone Region No depletion  Scapular Bone Region No depletion  Dorsal Hand No depletion  Patellar Region No depletion  Anterior Thigh Region No depletion  Posterior Calf Region No depletion  Edema (RD Assessment) Mild  Hair Reviewed  Eyes Reviewed  Mouth Reviewed  Skin Reviewed  Nails Reviewed       Diet Order:   Diet Order             Diet regular Room service appropriate? Yes; Fluid consistency: Thin  Diet effective now                   EDUCATION NEEDS:   No education needs have been identified at this time  Skin:  Skin Assessment: Reviewed RN Assessment  Last BM:  05/27/21  Height:   Ht Readings from Last 1 Encounters:  05/26/21 5\' 2"  (1.575 m)    Weight:   Wt Readings from Last 1 Encounters:  05/28/21 64.5 kg  Ideal Body Weight:  50 kg  BMI:  Body mass index is 26.01 kg/m.  Estimated Nutritional Needs:   Kcal:  1850-2050  Protein:  90-105 grams  Fluid:  > 1.8 L    Loistine Chance, RD, LDN, [place] Registered Dietitian II Certified Diabetes Care and Education Specialist Please refer to [facility] for RD and/or RD on-call/weekend/after hours pager

## 2021-05-28 NOTE — Progress Notes (Signed)
[name]    MR#:  [id number]  DATE OF BIRTH:  [birth year]  SUBJECTIVE:   Came in with increasing shortness of breath after Thanksgiving. Had family members visit at home started filling short of breath cough. Chronic smoker. Does not wear oxygen at home. Currently on 6 high flow nasal cannula oxygen.   Patient sister at bedside. Patient says she is on and off having some panic attack. She was on BiPAP last night. Currently on 6 L nasal cannula. Remains somewhat anxious. Asking for anti-anxiety meds. Not on anything at home.  REVIEW OF SYSTEMS:   Review of Systems  Constitutional:  Negative for chills, fever and weight loss.  HENT:  Negative for ear discharge, ear pain and nosebleeds.   Eyes:  Negative for blurred vision, pain and discharge.  Respiratory:  Negative for sputum production, shortness of breath, wheezing and stridor.   Cardiovascular:  Negative for chest pain, palpitations, orthopnea and PND.  Gastrointestinal:  Negative for abdominal pain, diarrhea, nausea and vomiting.  Genitourinary:  Negative for frequency and urgency.  Musculoskeletal:  Negative for back pain and joint pain.  Neurological:  Negative for sensory change, speech change, focal weakness and weakness.  Psychiatric/Behavioral:  Negative for depression and hallucinations. The patient is not nervous/anxious.   Tolerating Diet:yes Tolerating PT: pending  DRUG ALLERGIES:   Allergies  Allergen Reactions   Penicillins Other (See Comments)    Has patient had a PCN reaction causing immediate rash, facial/tongue/throat swelling, SOB or lightheadedness with hypotension: Yes Has patient had a PCN reaction causing severe rash involving mucus membranes or skin necrosis: Unknown Has patient had a PCN reaction that required hospitalization: Unknown Has patient had a PCN reaction occurring within the last 10 years: Unknown If all of the above  answers are "NO", then may proceed with Cephalosporin use. Pt has taken augmentin before    VITALS:  Blood pressure 101/74, pulse 75, temperature 97.7 F (36.5 C), resp. rate 20, height 5\' 2"  (1.575 m), weight 64.5 kg, SpO2 96 %.  PHYSICAL EXAMINATION:   Physical Exam  GENERAL:  71 y.o.-year-old patient lying in the bed with no acute distress. weak HEENT: Head atraumatic, normocephalic. Oropharynx and nasopharynx clear.  LUNGS: decreased breath sounds bilaterally, no wheezing, rales, rhonchi. No use of accessory muscles of respiration.  CARDIOVASCULAR: S1, S2 normal. No murmurs, rubs, or gallops.  ABDOMEN: Soft, nontender, nondistended. Bowel sounds present. No organomegaly or mass.  EXTREMITIES: No cyanosis, clubbing or edema b/l.    NEUROLOGIC: nonfocal PSYCHIATRIC:  patient is alert and anxious SKIN: No obvious rash, lesion, or ulcer.   LABORATORY PANEL:  CBC Recent Labs  Lab 05/27/21 0704  WBC 8.7  HGB 11.5*  HCT 35.6*  PLT 136*     Chemistries  Recent Labs  Lab 05/27/21 0704  NA 135  K 4.2  CL 107  CO2 24  GLUCOSE 146*  BUN 14  CREATININE 0.72  CALCIUM 7.9*  MG 2.0  AST 67*  ALT 32  ALKPHOS 66  BILITOT 0.4    Cardiac Enzymes No results for input(s): TROPONINI in the last 168 hours. RADIOLOGY:  CT CHEST WO CONTRAST  Result Date: 05/26/2021 CLINICAL DATA:  Respiratory failure. EXAM: CT CHEST WITHOUT CONTRAST TECHNIQUE: Multidetector CT imaging of the chest was performed following the standard protocol without IV contrast. COMPARISON:  Chest radiograph dated 05/26/2021. CT dated 04/09/2018. FINDINGS: Evaluation of this exam is limited in the  absence of intravenous contrast. Cardiovascular: There is no cardiomegaly or pericardial effusion. There is coronary vascular calcification. Moderate atherosclerotic calcification of the thoracic aorta. No aneurysmal dilatation. The central pulmonary arteries are grossly unremarkable. No hilar or mediastinal  adenopathy. Mediastinum/Nodes: The esophagus is grossly unremarkable. No mediastinal fluid collection. Lungs/Pleura: Bilateral lower lobe predominant streaky and scattered nodular densities most consistent with pneumonia, possibly viral or atypical in etiology. There is mild bibasilar bronchiectasis. There is no pleural effusion or pneumothorax. The central airways are patent. Upper Abdomen: No acute abnormality. Musculoskeletal: 68 old L1 compression fracture with greater than 50% loss of vertebral body height. There is 4 mm retropulsion of the superior posterior cortex. No acute osseous pathology. IMPRESSION: 1. Bilateral lower lobe predominant streaky and scattered nodular densities most consistent with pneumonia, possibly viral or atypical in etiology. 2. Old L1 compression fracture. 3. Aortic Atherosclerosis (ICD10-I70.0). Electronically Signed   By: Anner Crete M.D.   On: 05/26/2021 18:16   DG Chest Port 1 View  Result Date: 05/26/2021 CLINICAL DATA:  Shortness of breath for 5 days.  Fever.  Hypoxemia. EXAM: PORTABLE CHEST 1 VIEW COMPARISON:  Radiographs 04/09/2018 and 02/16/2018.  CT 04/09/2018. FINDINGS: 1533 hours. Low lung volumes. Allowing for this, the heart size and mediastinal contours are stable. Previously demonstrated right-sided empyema has resolved. There is chronic lung disease with emphysema and central airway thickening, but no focal airspace disease, pneumothorax or significant recurrent pleural effusion identified. The bones appear unremarkable. Telemetry leads overlie the chest. IMPRESSION: Chronic obstructive pulmonary disease with suboptimal inspiration. No acute findings identified. Electronically Signed   By: Richardean Sale M.D.   On: 05/26/2021 15:41   ASSESSMENT AND PLAN:   70 y/o F w/ PMH of COPD, current smoker, depression, HTN, chronic urinary urgency who presents w/ shortness of breath x 5 days. The shortness of breath is at rest as well as with exertion. Nothing  makes the shortness of better or worse. Pt denies hx of CHF. Pt does NOT use oxygen at home. However, pt is current smoker and smokes approx 10 cigarettes a day x 10 years.  Pt also c/o productive intermittent cough w/ clear sputum.   Acute hypoxic respiratory failure secondary to COPD exacerbation with history of excessive tobacco abuse in the setting of influenza A infection -- patient presented with shortness of breath sats 85% on 5 L nasal cannula. Patient had to be placed on BiPAP now we know --  currently on 6 L high flow nasal cannula oxygen. -- Wean as tolerated -- continue current bronchodilators and duo nebs -- empiric Zithromax given poor lung condition and elevated Pro calcitonin -- continue Tamiflu -- continue steroids -- incentive spirometer --12/1-- continues to require 6 L nasal cannula oxygen. Some issues with anxiety. PRN BiPAP. Discussed with respiratory therapist wean oxygen down to keep sats greater than 89--92%  tobacco abuse/current smoker -- patient advised smoking cessation  Depression -- continue BuSpar and Lexapro  hyperlipidemia  --continue statins  hyponatremia likely due to dehydration AK I -- resolved with IV fluids    Procedures: Family communication : sister at bedside Consults : CODE STATUS: full DVT Prophylaxis : enoxaparin Level of care: Progressive Status is: Inpatient  Remains inpatient appropriate because: hypoxic respiratory failure secondary to COPD and influenza A   TOTAL TIME TAKING CARE OF THIS PATIENT: 25 minutes.  >50% time spent on counselling and coordination of care  Note: This dictation was prepared with Dragon dictation along with smaller phrase technology. Any transcriptional  errors that result from this process are unintentional.  Fritzi Mandes M.D    [facility]   CC: Primary care physician; Donnamarie Rossetti, PA-C Patient ID: Wendy Arias, female   DOB: 05-24-1951, 70 y.o.   MRN: [id number]

## 2021-05-28 NOTE — Progress Notes (Addendum)
Respiratory therapy in to assess pt and Bipap settings. Saturations>90 and respers less labored.

## 2021-05-29 MED ORDER — PREDNISONE 50 MG PO TABS
50.0000 mg | ORAL_TABLET | Freq: Every day | ORAL | Status: DC
  Administered 2021-05-30: 50 mg via ORAL
  Filled 2021-05-29: qty 1

## 2021-05-29 MED ORDER — AZITHROMYCIN 250 MG PO TABS
500.0000 mg | ORAL_TABLET | Freq: Every day | ORAL | Status: AC
  Administered 2021-05-29 – 2021-05-30 (×2): 500 mg via ORAL
  Filled 2021-05-29 (×2): qty 2

## 2021-05-29 NOTE — Progress Notes (Signed)
Pt refused vitals for the rest of the night saying "I haven't slept good the entire time I've been here, please can you not wake me up tonight?" I let her know that we do need to monitor vital signs for her safety while she is admitted, which is every 4 hours on this unit, and staff may need to come in to the room to perform other care measures as well such as emptying purewick, safety rounding, repeat assessment, tele box/lead maintenance, but we would try our best to group care together as much as possible. Pt remains adamant that she does "not want to be woken up at all tonight." I raised the suggestion of perhaps just skipping midnight vitals and resuming at 0400, and trying to group the 0400 vitals set with morning labs. The patient was agreeable to this compromise. Also asked the patient if she would hit the call light if she happens to realize she is still awake at midnight so that we might collect these vital signs after all. Pt agrees with this plan.

## 2021-05-29 NOTE — Progress Notes (Signed)
[facility] [place] NAME: Tenia Goh    MR#:  [id number]  DATE OF BIRTH:  Mar 22, 1951  SUBJECTIVE:   Came in with increasing shortness of breath after Thanksgiving. Had family members visit at home started filling short of breath cough. Chronic smoker. Does not wear oxygen at home.   Patient sister at bedside. Patient feels a lot better after IV Lasix. Urine output 1700 mL overall. REVIEW OF SYSTEMS:   Review of Systems  Constitutional:  Negative for chills, fever and weight loss.  HENT:  Negative for ear discharge, ear pain and nosebleeds.   Eyes:  Negative for blurred vision, pain and discharge.  Respiratory:  Negative for sputum production, shortness of breath, wheezing and stridor.   Cardiovascular:  Negative for chest pain, palpitations, orthopnea and PND.  Gastrointestinal:  Negative for abdominal pain, diarrhea, nausea and vomiting.  Genitourinary:  Negative for frequency and urgency.  Musculoskeletal:  Negative for back pain and joint pain.  Neurological:  Negative for sensory change, speech change, focal weakness and weakness.  Psychiatric/Behavioral:  Negative for depression and hallucinations. The patient is not nervous/anxious.   Tolerating Diet:yes Tolerating PT: HH  DRUG ALLERGIES:   Allergies  Allergen Reactions   Penicillins Other (See Comments)    Has patient had a PCN reaction causing immediate rash, facial/tongue/throat swelling, SOB or lightheadedness with hypotension: Yes Has patient had a PCN reaction causing severe rash involving mucus membranes or skin necrosis: Unknown Has patient had a PCN reaction that required hospitalization: Unknown Has patient had a PCN reaction occurring within the last 10 years: Unknown If all of the above answers are "NO", then may proceed with Cephalosporin use. Pt has taken augmentin before    VITALS:  Blood pressure 107/84, pulse 70, temperature 98.3 F (36.8 C), resp. rate 18,  height 5\' 2"  (1.575 m), weight 64.9 kg, SpO2 92 %.  PHYSICAL EXAMINATION:   Physical Exam  GENERAL:  70 y.o.-year-old patient lying in the bed with no acute distress. weak HEENT: Head atraumatic, normocephalic. Oropharynx and nasopharynx clear.  LUNGS: decreased breath sounds bilaterally, no wheezing, rales, rhonchi. No use of accessory muscles of respiration.  CARDIOVASCULAR: S1, S2 normal. No murmurs, rubs, or gallops.  ABDOMEN: Soft, nontender, nondistended. Bowel sounds present. No organomegaly or mass.  EXTREMITIES: No cyanosis, clubbing or edema b/l.    NEUROLOGIC: nonfocal PSYCHIATRIC:  patient is alert and anxious SKIN: No obvious rash, lesion, or ulcer.   LABORATORY PANEL:  CBC Recent Labs  Lab 05/27/21 0704  WBC 8.7  HGB 11.5*  HCT 35.6*  PLT 136*     Chemistries  Recent Labs  Lab 05/27/21 0704  NA 135  K 4.2  CL 107  CO2 24  GLUCOSE 146*  BUN 14  CREATININE 0.72  CALCIUM 7.9*  MG 2.0  AST 67*  ALT 32  ALKPHOS 66  BILITOT 0.4    Cardiac Enzymes No results for input(s): TROPONINI in the last 168 hours. RADIOLOGY:  No results found. ASSESSMENT AND PLAN:   69 y/o F w/ PMH of COPD, current smoker, depression, HTN, chronic urinary urgency who presents w/ shortness of breath x 5 days. The shortness of breath is at rest as well as with exertion. Nothing makes the shortness of better or worse. Pt denies hx of CHF. Pt does NOT use oxygen at home. However, pt is current smoker and smokes approx 10 cigarettes a day x 10 years.  Pt also c/o productive  intermittent cough w/ clear sputum.   Acute hypoxic respiratory failure secondary to COPD exacerbation with history of excessive tobacco abuse in the setting of influenza A infection -- patient presented with shortness of breath sats 85% on 5 L nasal cannula. Patient had to be placed on BiPAP now we know --  currently on 6 L high flow nasal cannula oxygen. -- Wean as tolerated -- continue current bronchodilators  and duo nebs -- empiric Zithromax given poor lung condition and elevated Pro calcitonin -- continue Tamiflu -- continue steroids -- incentive spirometer --12/1-- continues to require 6 L nasal cannula oxygen. Some issues with anxiety. PRN BiPAP. Discussed with respiratory therapist wean oxygen down to keep sats greater than 89--92% --12/2-- patient overall feels better after IV diuresis. Cleaning oxygen down. She will qualify for home oxygen. Home health orders placed. Switch to oral steroids  tobacco abuse/current smoker -- patient advised smoking cessation  Depression -- continue BuSpar and Lexapro  hyperlipidemia  --continue statins  hyponatremia likely due to dehydration AK I -- resolved with IV fluids   Family communication : sister at bedside Consults : CODE STATUS: full DVT Prophylaxis : enoxaparin Level of care: Progressive Status is: Inpatient  Remains inpatient appropriate because: hypoxic respiratory failure secondary to COPD and influenza A anticipate discharge tomorrow. Patient and sister in agreement. TOC for oxygen and home health.  TOTAL TIME TAKING CARE OF THIS PATIENT: 25 minutes.  >50% time spent on counselling and coordination of care  Note: This dictation was prepared with Dragon dictation along with smaller phrase technology. Any transcriptional errors that result from this process are unintentional.  Fritzi Mandes M.D    [facility]   CC: Primary care physician; Donnamarie Rossetti, PA-C Patient ID: CHRYSTIAN RESSLER, female   DOB: 02/04/1951, 70 y.o.   MRN: [id number]

## 2021-05-29 NOTE — Progress Notes (Signed)
Patient ambulated with staff assistance in room.   SaO2 on room air at rest = 88% SaO2 on room air while ambulating = 85% SaO2 on 4 liters of O2 while ambulating = 90%

## 2021-05-29 NOTE — Progress Notes (Signed)
Physical Therapy Treatment Patient Details Name: Wendy Arias MRN: [id number] DOB: 04/15/1951 Today's Date: 05/29/2021   History of Present Illness 70 y/o F w/ PMH of COPD, current smoker, depression, HTN, chronic urinary urgency who presents w/ shortness of breath x 5 days. The shortness of breath is at rest as well as with exertion.    PT Comments    Pt received supine in bed, agreeable to therapy, sister at bedside. Pt pleasant and sister very supportive. Bed mobility remains SUP for safety with PT managing lines and leads. Upon initial stand, pt demo knee buckling. RW placed in front of pt for safety. Pt ambulated 59ft + 22ft within session and remained sitting in recliner at end of session. Continuous monitoring of SpO2 during ambulation w/ and w/o supplemental O2 to qualify for home O2 - numbers provided to RN to enter into system. Pt declined to 85% on RA within 12ft and required 4L O2 to recover >88% with continued mobility. BLE remain weak and functional mobility/endurance limited. Would benefit from skilled PT to address above deficits and promote optimal return to PLOF.   Recommendations for follow up therapy are one component of a multi-disciplinary discharge planning process, led by the attending physician.  Recommendations may be updated based on patient status, additional functional criteria and insurance authorization.  Follow Up Recommendations  Home health PT     Assistance Recommended at Discharge Intermittent Supervision/Assistance  Equipment Recommendations  None recommended by PT    Recommendations for Other Services       Precautions / Restrictions Precautions Precautions: Fall Restrictions Weight Bearing Restrictions: No     Mobility  Bed Mobility Overal bed mobility: Needs Assistance Bed Mobility: Supine to Sit     Supine to sit: Supervision;HOB elevated     General bed mobility comments: SUP for safety. Increased time and effort. Use of bed rails.     Transfers Overall transfer level: Needs assistance Equipment used: Rolling walker (2 wheels) Transfers: Sit to/from Stand Sit to Stand: Min guard           General transfer comment: MIN A for increased steadying upon standing. VC for hand placement.    Ambulation/Gait Ambulation/Gait assistance: Min assist Gait Distance (Feet): 25 Feet Assistive device: Rolling walker (2 wheels) Gait Pattern/deviations: Step-through pattern;Trunk flexed Gait velocity: decreased     General Gait Details: 70ft + 46ft, RW and MIN A for initial knee buckling. 2nd bout of ambulation impreved to CGA.   Stairs             Wheelchair Mobility    Modified Rankin (Stroke Patients Only)       Balance Overall balance assessment: Needs assistance Sitting-balance support: Bilateral upper extremity supported;Feet supported Sitting balance-Leahy Scale: Good Sitting balance - Comments: No LOB sitting EOB.   Standing balance support: During functional activity;Reliant on assistive device for balance;Bilateral upper extremity supported Standing balance-Leahy Scale: Poor Standing balance comment: Pt requiring UE support on RW and MIN A to prevent LOB from knee buckling. Knee buckling did improve however pt continued to require UE assist.                            Cognition Arousal/Alertness: Awake/alert Behavior During Therapy: WFL for tasks assessed/performed Overall Cognitive Status: Within Functional Limits for tasks assessed  Exercises Other Exercises Other Exercises: Continuous monitoring of SpO2 during ambulation w/ and w/o supplemental O2 to qualify for home O2 - numbers provided to RN to enter into system. Pt declined to 85% on RA within 66ft and required 4L O2 to recover >88% with continued mobility.    General Comments        Pertinent Vitals/Pain Pain Assessment: No/denies pain    Home Living                           Prior Function            PT Goals (current goals can now be found in the care plan section) Acute Rehab PT Goals Patient Stated Goal: to go home PT Goal Formulation: With patient Time For Goal Achievement: 06/10/21 Potential to Achieve Goals: Good    Frequency    Min 2X/week      PT Plan      Co-evaluation              AM-PAC PT "6 Clicks" Mobility   Outcome Measure  Help needed turning from your back to your side while in a flat bed without using bedrails?: A Little Help needed moving from lying on your back to sitting on the side of a flat bed without using bedrails?: A Little Help needed moving to and from a bed to a chair (including a wheelchair)?: A Little Help needed standing up from a chair using your arms (e.g., wheelchair or bedside chair)?: A Little Help needed to walk in hospital room?: A Little Help needed climbing 3-5 steps with a railing? : A Lot 6 Click Score: 17    End of Session Equipment Utilized During Treatment: Oxygen;Gait belt Activity Tolerance: Patient tolerated treatment well Patient left: with call bell/phone within reach;with family/visitor present;in chair Nurse Communication: Mobility status (SpO2 reading for home O2 qualification) PT Visit Diagnosis: Muscle weakness (generalized) (M62.81);Difficulty in walking, not elsewhere classified (R26.2)     Time: 4315-4008 PT Time Calculation (min) (ACUTE ONLY): 28 min  Charges:  $Therapeutic Activity: 23-37 mins                     Patrina Levering PT, DPT 05/29/21 1:33 PM [phone]

## 2021-05-29 NOTE — TOC Initial Note (Signed)
Transition of Care [facility]) - Initial/Assessment Note    Patient Details  Name: Wendy Arias MRN: [id number] Date of Birth: 08/24/1950  Transition of Care [facility] CM/SW Contact:    Alberteen Sam, LCSW Phone Number: 05/29/2021, 2:45 PM  Clinical Narrative:                  CSW spoke with patient regarding discharge planning, agreeable to home health services to be set up with no preference of agency.   CSW spoke with Ramond Marrow with [facility], able to accept for [facility] PT and RN.   Patient is set up with 4L of oxygen, [facility] to deliver ot hospital room and do home set up.   No further discharge needs identified.     Expected Discharge Plan: [place] Barriers to Discharge: Continued Medical Work up   Patient Goals and CMS Choice Patient states their goals for this hospitalization and ongoing recovery are:: to go home CMS Medicare.gov Compare Post Acute Care list provided to:: Patient Choice offered to / list presented to : Patient  Expected Discharge Plan and Services Expected Discharge Plan: [place] Choice: [place] Living arrangements for the past 2 months: Single Family Home                 DME Arranged: Oxygen DME Agency: [facility] Date DME Agency Contacted: 05/29/21 Time DME Agency Contacted: 57 Representative spoke with at DME Agency: Thedore Mins HH Arranged: PT, RN [place] Agency: [place] ([place]) Date [place]: 05/29/21 Time [place]: 88 Representative spoke with at [place]: Ramond Marrow  Prior Living Arrangements/Services Living arrangements for the past 2 months: [place] with:: Self   Do you feel safe going back to the place where you live?: Yes               Activities of Daily Living Home Assistive Devices/Equipment: None ADL Screening (condition at time of admission) Patient's cognitive ability adequate to safely complete daily activities?:  Yes Is the patient deaf or have difficulty hearing?: No Does the patient have difficulty seeing, even when wearing glasses/contacts?: No Does the patient have difficulty concentrating, remembering, or making decisions?: No Patient able to express need for assistance with ADLs?: Yes Does the patient have difficulty dressing or bathing?: No Independently performs ADLs?: Yes (appropriate for developmental age) Does the patient have difficulty walking or climbing stairs?: No Weakness of Legs: None Weakness of Arms/Hands: None  Permission Sought/Granted Permission sought to share information with : Case Manager, Customer service manager, Family Supports Permission granted to share information with : Yes, Verbal Permission Granted              Emotional Assessment   Attitude/Demeanor/Rapport: Gracious Affect (typically observed): Calm Orientation: : Oriented to Self, Oriented to Place, Oriented to  Time, Oriented to Situation Alcohol / Substance Use: Not Applicable Psych Involvement: No (comment)  Admission diagnosis:  SOB (shortness of breath) [R06.02] Influenza A [J10.1] COPD exacerbation (HCC) [J44.1] Acute respiratory failure with hypoxia ([place]) [J96.01] Patient Active Problem List   Diagnosis Date Noted   COPD exacerbation ([place]) 05/26/2021   Abnormal pleural fluid    Hepatic cirrhosis ([place])    Anemia of chronic disease    Empyema of right pleural space ([place])    Pneumonia 04/09/2018   Abscess of middle lobe of right lung with pneumonia ([place]) 02/16/2018   Mass of middle lobe  of right lung 11/22/2017   Mass of lower lobe of right lung 11/22/2017   Mediastinal adenopathy 11/22/2017   Acute encephalopathy 11/03/2017   Benzodiazepine withdrawal ([place]) 11/03/2017   H/O diarrhea 06/14/2017   PCP:  Donnamarie Rossetti, PA-C Pharmacy:   [place], [place] - [address]  [place] [address] Phone: [phone] Fax:  [phone]  [facility] [place] - [address] [place] [address] Phone: [phone] Fax: [phone]     Social Determinants of Health (SDOH) Interventions    Readmission Risk Interventions No flowsheet data found.

## 2021-05-30 MED ORDER — OSELTAMIVIR PHOSPHATE 30 MG PO CAPS: 30.0000 mg | ORAL_CAPSULE | Freq: Two times a day (BID) | ORAL | 0 refills | Status: AC

## 2021-05-30 MED ORDER — PREDNISONE 10 MG PO TABS: ORAL_TABLET | ORAL | 0 refills | Status: DC

## 2021-05-30 MED ORDER — AZITHROMYCIN 250 MG PO TABS: ORAL_TABLET | ORAL | 0 refills | Status: DC

## 2021-05-30 MED ORDER — TRELEGY ELLIPTA 100-62.5-25 MCG/ACT IN AEPB: 1.0000 | INHALATION_SPRAY | Freq: Every day | RESPIRATORY_TRACT | 3 refills | Status: DC

## 2021-05-30 NOTE — TOC Transition Note (Signed)
Transition of Care [facility]) - CM/SW Discharge Note   Patient Details  Name: Wendy Arias MRN: [id number] Date of Birth: [birth year]  Transition of Care [facility]) CM/SW Contact:  Izola Price, RN Phone Number: 05/30/2021, 12:57 PM   Clinical Narrative:  Patient being discharged today with [facility] via [facility]. Oxygen DME was not set up yesterday due to unable to contact sister for discharge address. Oxygen decreased to 3L/[place] and [employer] to deliver portable oxygen condenser to room and will arrange home set up tomorrow. Contact information for sister given to [employer]. Simmie Davies RN CM      Final next level of care: [place] Barriers to Discharge: Barriers Resolved   Patient Goals and CMS Choice Patient states their goals for this hospitalization and ongoing recovery are:: to go home CMS Medicare.gov Compare Post Acute Care list provided to:: Patient Choice offered to / list presented to : Patient  Discharge Placement                       Discharge Plan and Services     Post Acute Care Choice: [place]          DME Arranged: Oxygen DME Agency: [facility] Date DME Agency Contacted: 05/30/21 Time DME Agency Contacted: 1100 Representative spoke with at DME Agency: Delana Meyer (was not yet set up at home) [place] Arranged: PT, RN JAARS Agency: [place] ([place]) Date [place]: 05/29/21 Time [place]: [place] Representative spoke with at [place]: [place] ([place]) Interventions     Readmission Risk Interventions No flowsheet data found.

## 2021-05-30 NOTE — Discharge Instructions (Signed)
Use your oxygen as instructed. Use your inhalers as before.

## 2021-05-30 NOTE — Discharge Summary (Signed)
[place] [facility] [place] NAME: Wendy Arias    MR#:  [id number]  DATE OF BIRTH:  Feb 15, 1951  DATE OF ADMISSION:  05/26/2021 ADMITTING PHYSICIAN: Wyvonnia Dusky, MD  DATE OF DISCHARGE: 05/30/2021  PRIMARY CARE PHYSICIAN: Donnamarie Rossetti, PA-C    ADMISSION DIAGNOSIS:  SOB (shortness of breath) [R06.02] Influenza A [J10.1] COPD exacerbation (HCC) [J44.1] Acute respiratory failure with hypoxia (HCC) [J96.01]  DISCHARGE DIAGNOSIS:  acute respiratory failure with hypoxia secondary to COPD exacerbation influenza A  SECONDARY DIAGNOSIS:   Past Medical History:  Diagnosis Date   Anxiety    Asthma    COPD (chronic obstructive pulmonary disease) ([place])    Depression    Hypertension    Myocardial infarction [facility])    Osteoporosis     HOSPITAL COURSE:  70 y/o F w/ PMH of COPD, current smoker, depression, HTN, chronic urinary urgency who presents w/ shortness of breath x 5 days. The shortness of breath is at rest as well as with exertion. Nothing makes the shortness of better or worse. Pt denies hx of CHF. Pt does NOT use oxygen at home. However, pt is current smoker and smokes approx 10 cigarettes a day x 10 years.  Pt also c/o productive intermittent cough w/ clear sputum.    Acute hypoxic respiratory failure secondary to COPD exacerbation with history of excessive tobacco abuse in the setting of influenza A infection -- patient presented with shortness of breath sats 85% on 5 L nasal cannula. Patient had to be placed on BiPAP now we know --  currently on 6 L high flow nasal cannula oxygen. -- Wean as tolerated -- continue current bronchodilators and duo nebs -- empiric Zithromax given poor lung condition and elevated Pro calcitonin -- continue Tamiflu -- continue steroids -- incentive spirometer --12/1-- continues to require 6 L nasal cannula oxygen. Some issues with anxiety. PRN BiPAP. Discussed with respiratory therapist wean oxygen  down to keep sats greater than 89--92% --12/2-- patient overall feels better after IV diuresis. Cleaning oxygen down. She will qualify for home oxygen. Home health orders placed. Switch to oral steroids --12/3-- overall feeling better. Will discharged to home with oxygen. Patient agreeable. Should finish up remaining steroid and Tamiflu along with antibiotic at home.   tobacco abuse/current smoker -- patient advised smoking cessation   Depression -- continue BuSpar and Lexapro   hyperlipidemia  --continue statins   hyponatremia likely due to dehydration AK I -- resolved with IV fluids     Family communication : none today Consults : CODE STATUS: full DVT Prophylaxis : enoxaparin Level of care: Progressive Status is: Inpatient   discharged home with home health and oxygen CONSULTS OBTAINED:    DRUG ALLERGIES:   Allergies  Allergen Reactions   Penicillins Other (See Comments)    Has patient had a PCN reaction causing immediate rash, facial/tongue/throat swelling, SOB or lightheadedness with hypotension: Yes Has patient had a PCN reaction causing severe rash involving mucus membranes or skin necrosis: Unknown Has patient had a PCN reaction that required hospitalization: Unknown Has patient had a PCN reaction occurring within the last 10 years: Unknown If all of the above answers are "NO", then may proceed with Cephalosporin use. Pt has taken augmentin before    DISCHARGE MEDICATIONS:   Allergies as of 05/30/2021       Reactions   Penicillins Other (See Comments)   Has patient had a PCN reaction causing immediate rash, facial/tongue/throat swelling, SOB or lightheadedness  with hypotension: Yes Has patient had a PCN reaction causing severe rash involving mucus membranes or skin necrosis: Unknown Has patient had a PCN reaction that required hospitalization: Unknown Has patient had a PCN reaction occurring within the last 10 years: Unknown If all of the above answers are  "NO", then may proceed with Cephalosporin use. Pt has taken augmentin before        Medication List     STOP taking these medications    tiotropium 18 MCG inhalation capsule Commonly known as: SPIRIVA       TAKE these medications    albuterol 108 (90 Base) MCG/ACT inhaler Commonly known as: VENTOLIN HFA Inhale into the lungs every 6 (six) hours as needed for wheezing or shortness of breath.   aspirin 325 MG tablet Take 325 mg by mouth daily.   atorvastatin 40 MG tablet Commonly known as: LIPITOR Take 40 mg by mouth daily.   azithromycin 250 MG tablet Commonly known as: ZITHROMAX Take daily as directed   busPIRone 15 MG tablet Commonly known as: BUSPAR Take 15 mg by mouth 2 (two) times daily at 10 AM and 5 PM.   escitalopram 10 MG tablet Commonly known as: LEXAPRO Take 10 mg by mouth daily.   Magnesium 400 MG Caps Take 1 tablet by mouth daily.   metoprolol succinate 25 MG 24 hr tablet Commonly known as: TOPROL-XL Take 25 mg by mouth daily.   oseltamivir 30 MG capsule Commonly known as: TAMIFLU Take 1 capsule (30 mg total) by mouth 2 (two) times daily for 3 doses.   potassium chloride 10 MEQ tablet Commonly known as: KLOR-CON Take 10 mEq by mouth daily.   predniSONE 10 MG tablet Commonly known as: DELTASONE Take 50 mg daily---taper by 10 mg daily then stop Start taking on: May 31, 2021   Trelegy Ellipta 100-62.5-25 MCG/ACT Aepb Generic drug: Fluticasone-Umeclidin-Vilant Inhale 1 puff into the lungs daily.   vitamin B-12 1000 MCG tablet Commonly known as: CYANOCOBALAMIN Take 1,000 mcg by mouth daily.               Durable Medical Equipment  (From admission, onward)           Start     Ordered   05/29/21 1443  For home use only DME oxygen  Once       Question Answer Comment  Length of Need Lifetime   Mode or (Route) Nasal cannula   Liters per Minute 4   Frequency Continuous (stationary and portable oxygen unit needed)    Oxygen conserving device Yes   Oxygen delivery system Gas      05/29/21 1442            If you experience worsening of your admission symptoms, develop shortness of breath, life threatening emergency, suicidal or homicidal thoughts you must seek medical attention immediately by calling 911 or calling your MD immediately  if symptoms less severe.  You Must read complete instructions/literature along with all the possible adverse reactions/side effects for all the Medicines you take and that have been prescribed to you. Take any new Medicines after you have completely understood and accept all the possible adverse reactions/side effects.   Please note  You were cared for by a hospitalist during your hospital stay. If you have any questions about your discharge medications or the care you received while you were in the hospital after you are discharged, you can call the unit and asked to speak with the hospitalist on call  if the hospitalist that took care of you is not available. Once you are discharged, your primary care physician will handle any further medical issues. Please note that NO REFILLS for any discharge medications will be authorized once you are discharged, as it is imperative that you return to your primary care physician (or establish a relationship with a primary care physician if you do not have one) for your aftercare needs so that they can reassess your need for medications and monitor your lab values. Today   SUBJECTIVE   Feels better today Sats 94-100% on 3 liters  VITAL SIGNS:  Blood pressure 131/78, pulse 64, temperature 98.1 F (36.7 C), resp. rate 18, height 5\' 2"  (1.575 m), weight 62.6 kg, SpO2 94 %.  I/O:   Intake/Output Summary (Last 24 hours) at 05/30/2021 0952 Last data filed at 05/30/2021 0800 Gross per 24 hour  Intake 363 ml  Output 1200 ml  Net -837 ml    PHYSICAL EXAMINATION:  GENERAL:  70 y.o.-year-old patient lying in the bed with no acute  distress.  LUNGS: decreased breath sounds bilaterally, no wheezing, rales,rhonchi or crepitation. No use of accessory muscles of respiration.  CARDIOVASCULAR: S1, S2 normal. No murmurs, rubs, or gallops.  ABDOMEN: Soft, non-tender, non-distended. Bowel sounds present. No organomegaly or mass.  EXTREMITIES: No pedal edema, cyanosis, or clubbing.  NEUROLOGIC: non-focal PSYCHIATRIC:  patient is alert and oriented x 3.  SKIN: No obvious rash, lesion, or ulcer.   DATA REVIEW:   CBC  Recent Labs  Lab 05/27/21 0704  WBC 8.7  HGB 11.5*  HCT 35.6*  PLT 136*    Chemistries  Recent Labs  Lab 05/27/21 0704  NA 135  K 4.2  CL 107  CO2 24  GLUCOSE 146*  BUN 14  CREATININE 0.72  CALCIUM 7.9*  MG 2.0  AST 67*  ALT 32  ALKPHOS 66  BILITOT 0.4    Microbiology Results   Recent Results (from the past 240 hour(s))  Resp Panel by RT-PCR (Flu A&B, Covid) Nasopharyngeal Swab     Status: Abnormal   Collection Time: 05/26/21  2:46 PM   Specimen: Nasopharyngeal Swab; Nasopharyngeal(NP) swabs in vial transport medium  Result Value Ref Range Status   SARS Coronavirus 2 by RT PCR NEGATIVE NEGATIVE Final    Comment: (NOTE) SARS-CoV-2 target nucleic acids are NOT DETECTED.  The SARS-CoV-2 RNA is generally detectable in upper respiratory specimens during the acute phase of infection. The lowest concentration of SARS-CoV-2 viral copies this assay can detect is 138 copies/mL. A negative result does not preclude SARS-Cov-2 infection and should not be used as the sole basis for treatment or other patient management decisions. A negative result may occur with  improper specimen collection/handling, submission of specimen other than nasopharyngeal swab, presence of viral mutation(s) within the areas targeted by this assay, and inadequate number of viral copies(<138 copies/mL). A negative result must be combined with clinical observations, patient history, and epidemiological information. The  expected result is Negative.  Fact Sheet for Patients:  EntrepreneurPulse.com.au  Fact Sheet for Healthcare Providers:  IncredibleEmployment.be  This test is no t yet approved or cleared by the [place] FDA and  has been authorized for detection and/or diagnosis of SARS-CoV-2 by FDA under an Emergency Use Authorization (EUA). This EUA will remain  in effect (meaning this test can be used) for the duration of the COVID-19 declaration under Section 564(b)(1) of the Act, 21 U.S.C.section 360bbb-3(b)(1), unless the authorization is terminated  or revoked  sooner.       Influenza A by PCR POSITIVE (A) NEGATIVE Final   Influenza B by PCR NEGATIVE NEGATIVE Final    Comment: (NOTE) The Xpert Xpress SARS-CoV-2/FLU/RSV plus assay is intended as an aid in the diagnosis of influenza from Nasopharyngeal swab specimens and should not be used as a sole basis for treatment. Nasal washings and aspirates are unacceptable for Xpert Xpress SARS-CoV-2/FLU/RSV testing.  Fact Sheet for Patients: EntrepreneurPulse.com.au  Fact Sheet for Healthcare Providers: IncredibleEmployment.be  This test is not yet approved or cleared by the [place] FDA and has been authorized for detection and/or diagnosis of SARS-CoV-2 by FDA under an Emergency Use Authorization (EUA). This EUA will remain in effect (meaning this test can be used) for the duration of the COVID-19 declaration under Section 564(b)(1) of the Act, 21 U.S.C. section 360bbb-3(b)(1), unless the authorization is terminated or revoked.  Performed at [facility], [address] [place], [place] [address]   Blood Culture (routine x 2)     Status: None (Preliminary result)   Collection Time: 05/26/21  3:30 PM   Specimen: BLOOD  Result Value Ref Range Status   Specimen Description BLOOD LEFT ANTECUBITAL  Final   Special Requests   Final    BOTTLES DRAWN  AEROBIC AND ANAEROBIC Blood Culture results may not be optimal due to an inadequate volume of blood received in culture bottles   Culture   Final    NO GROWTH 4 DAYS Performed at [facility], [address], [place], [place] [address]    Report Status PENDING  Incomplete  Blood Culture (routine x 2)     Status: None (Preliminary result)   Collection Time: 05/26/21  3:30 PM   Specimen: BLOOD  Result Value Ref Range Status   Specimen Description BLOOD RIGHT ANTECUBITAL  Final   Special Requests   Final    BOTTLES DRAWN AEROBIC AND ANAEROBIC Blood Culture results may not be optimal due to an excessive volume of blood received in culture bottles   Culture   Final    NO GROWTH 4 DAYS Performed at [facility], [address] [place], [place] [address]    Report Status PENDING  Incomplete    RADIOLOGY:  No results found.   CODE STATUS:     Code Status Orders  (From admission, onward)           Start     Ordered   05/26/21 2032  Full code  Continuous        05/26/21 2031           Code Status History     Date Active Date Inactive Code Status Order ID Comments User Context   05/26/2021 1705 05/26/2021 2031 DNR [id number]  Wyvonnia Dusky, MD ED   04/09/2018 1817 04/14/2018 2200 DNR [id number]  Vaughan Basta, MD Inpatient   02/16/2018 1842 02/19/2018 1751 DNR [id number]  Sela Hua, MD Inpatient   11/03/2017 0336 11/04/2017 1928 Full Code [id number]  Amelia Jo, MD Inpatient        TOTAL TIME TAKING CARE OF THIS PATIENT: 40 minutes.    Fritzi Mandes M.D  [facility]    CC: Primary care physician; [place], [employer], PA-C

## 2021-05-30 NOTE — Progress Notes (Signed)
Nursing Discharge Note   Admit Date: 05/26/2021   Discharge date: 05/30/2021   Wendy Arias is to be discharged home per MD order.  AVS completed. Reviewed with patient and sister [name] at bedside. Highlighted copy provided for patient to take home.  Patient able to verbalize understanding of discharge instructions. PIV removed. Patient stable upon discharge.   [employer] delivered home oxygen to bedside for patient to take home with her for home use.    Discharge Instructions      Use your oxygen as instructed. Use your inhalers as before.    Allergies as of 05/30/2021       Reactions   Penicillins Other (See Comments)   Has patient had a PCN reaction causing immediate rash, facial/tongue/throat swelling, SOB or lightheadedness with hypotension: Yes Has patient had a PCN reaction causing severe rash involving mucus membranes or skin necrosis: Unknown Has patient had a PCN reaction that required hospitalization: Unknown Has patient had a PCN reaction occurring within the last 10 years: Unknown If all of the above answers are "NO", then may proceed with Cephalosporin use. Pt has taken augmentin before        Medication List     STOP taking these medications    tiotropium 18 MCG inhalation capsule Commonly known as: SPIRIVA       TAKE these medications    albuterol 108 (90 Base) MCG/ACT inhaler Commonly known as: VENTOLIN HFA Inhale into the lungs every 6 (six) hours as needed for wheezing or shortness of breath.   aspirin 325 MG tablet Take 325 mg by mouth daily.   atorvastatin 40 MG tablet Commonly known as: LIPITOR Take 40 mg by mouth daily.   azithromycin 250 MG tablet Commonly known as: ZITHROMAX Take daily as directed   busPIRone 15 MG tablet Commonly known as: BUSPAR Take 15 mg by mouth 2 (two) times daily at 10 AM and 5 PM.   escitalopram 10 MG tablet Commonly known as: LEXAPRO Take 10 mg by mouth daily.   Magnesium 400 MG Caps Take 1 tablet  by mouth daily.   metoprolol succinate 25 MG 24 hr tablet Commonly known as: TOPROL-XL Take 25 mg by mouth daily.   oseltamivir 30 MG capsule Commonly known as: TAMIFLU Take 1 capsule (30 mg total) by mouth 2 (two) times daily for 3 doses.   potassium chloride 10 MEQ tablet Commonly known as: KLOR-CON Take 10 mEq by mouth daily.   predniSONE 10 MG tablet Commonly known as: DELTASONE Take 50 mg daily---taper by 10 mg daily then stop Start taking on: May 31, 2021   Trelegy Ellipta 100-62.5-25 MCG/ACT Aepb Generic drug: Fluticasone-Umeclidin-Vilant Inhale 1 puff into the lungs daily.   vitamin B-12 1000 MCG tablet Commonly known as: CYANOCOBALAMIN Take 1,000 mcg by mouth daily.               Durable Medical Equipment  (From admission, onward)           Start     Ordered   05/30/21 1000  For home use only DME oxygen  Once       Question Answer Comment  Length of Need Lifetime   Mode or (Route) Nasal cannula   Liters per Minute 3   Frequency Continuous (stationary and portable oxygen unit needed)   Oxygen conserving device Yes   Oxygen delivery system Gas      05/30/21 0959             Discharge Instructions/  Education: Discharge instructions given to patient and sister [name] with verbalized understanding.  Discharge education completed with patient/family including: follow up instructions, medication list, discharge activities, and limitations if indicated.   Patient escorted via wheelchair to lobby and discharged home via private automobile.

## 2021-05-31 LAB — CULTURE, BLOOD (ROUTINE X 2)
Culture: NO GROWTH
Culture: NO GROWTH

## 2021-07-07 ENCOUNTER — Encounter (INDEPENDENT_AMBULATORY_CARE_PROVIDER_SITE_OTHER): Payer: PPO

## 2021-07-07 ENCOUNTER — Encounter (INDEPENDENT_AMBULATORY_CARE_PROVIDER_SITE_OTHER): Payer: PPO | Admitting: Vascular Surgery

## 2021-08-03 ENCOUNTER — Other Ambulatory Visit (INDEPENDENT_AMBULATORY_CARE_PROVIDER_SITE_OTHER): Payer: Self-pay | Admitting: Vascular Surgery

## 2021-08-03 DIAGNOSIS — I739 Peripheral vascular disease, unspecified: Secondary | ICD-10-CM

## 2021-08-04 ENCOUNTER — Ambulatory Visit (INDEPENDENT_AMBULATORY_CARE_PROVIDER_SITE_OTHER): Payer: PPO

## 2021-08-04 ENCOUNTER — Ambulatory Visit (INDEPENDENT_AMBULATORY_CARE_PROVIDER_SITE_OTHER): Payer: PPO | Admitting: Vascular Surgery

## 2021-08-04 ENCOUNTER — Other Ambulatory Visit: Payer: Self-pay

## 2021-08-04 VITALS — BP 112/74 | HR 75 | Ht 62.0 in | Wt 134.0 lb

## 2021-08-04 DIAGNOSIS — E7800 Pure hypercholesterolemia, unspecified: Secondary | ICD-10-CM

## 2021-08-04 DIAGNOSIS — I739 Peripheral vascular disease, unspecified: Secondary | ICD-10-CM

## 2021-08-04 DIAGNOSIS — I70223 Atherosclerosis of native arteries of extremities with rest pain, bilateral legs: Secondary | ICD-10-CM | POA: Diagnosis not present

## 2021-08-04 DIAGNOSIS — I10 Essential (primary) hypertension: Secondary | ICD-10-CM

## 2021-08-04 NOTE — Progress Notes (Signed)
Patient ID: Wendy Arias, female   DOB: 1951/03/14, 71 y.o.   MRN: [id number]  Chief Complaint  Patient presents with   Follow-up    U/S follow up    HPI Wendy Arias is a 71 y.o. female.  I am asked to see the patient by Grayland Ormond for evaluation of severe lower extremity pain and PAD.  She had an abnormal ABI performed at her primary office and describes symptoms worrisome for rest pain particularly in the right foot.  She cannot sleep at night.  She has to get up and dangle her foot.  She has pain on the left side as well, but it is not as severe or intense.  She denies any fevers or chills.  This has been a progressive problem over many months without a clear cause or inciting event.  We performed noninvasive studies today which showed a markedly reduced ABI on the right of 0.34 and a fairly severely reduced ABI on the left at 0.53.  Digit pressures were 40 or less bilaterally.     Past Medical History:  Diagnosis Date   Anxiety    Asthma    COPD (chronic obstructive pulmonary disease) ([place])    Depression    Hypertension    Myocardial infarction [facility])    Osteoporosis     Past Surgical History:  Procedure Laterality Date   ABDOMINAL HYSTERECTOMY     partial   CARDIAC CATHETERIZATION     CARDIAC SURGERY     COLONOSCOPY WITH PROPOFOL N/A 04/12/2017   Procedure: COLONOSCOPY WITH PROPOFOL;  Surgeon: Lollie Sails, MD;  Location: [facility] ENDOSCOPY;  Service: Endoscopy;  Laterality: N/A;   CORONARY ANGIOPLASTY     ENDOBRONCHIAL ULTRASOUND N/A 11/28/2017   Procedure: ENDOBRONCHIAL ULTRASOUND;  Surgeon: Laverle Hobby, MD;  Location: [facility] ORS;  Service: Pulmonary;  Laterality: N/A;   REDUCTION MAMMAPLASTY       Family History  Problem Relation Age of Onset   Breast cancer Mother 45   Hypertension Mother    Dementia Mother    Breast cancer Maternal Aunt 88   Breast cancer Other 87   Hypertension Father    Heart disease Father    Diabetes Father    Diabetes  Sister       Social History   Tobacco Use   Smoking status: Some Days    Packs/day: 0.25    Years: 20.00    Pack years: 5.00    Types: Cigarettes   Smokeless tobacco: Never  Vaping Use   Vaping Use: Never used  Substance Use Topics   Alcohol use: Yes    Alcohol/week: 3.0 standard drinks    Types: 1 Glasses of wine, 2 Cans of beer per week    Comment: rarely   Drug use: Never     Allergies  Allergen Reactions   Penicillins Other (See Comments)    Has patient had a PCN reaction causing immediate rash, facial/tongue/throat swelling, SOB or lightheadedness with hypotension: Yes Has patient had a PCN reaction causing severe rash involving mucus membranes or skin necrosis: Unknown Has patient had a PCN reaction that required hospitalization: Unknown Has patient had a PCN reaction occurring within the last 10 years: Unknown If all of the above answers are "NO", then may proceed with Cephalosporin use. Pt has taken augmentin before    Current Outpatient Medications  Medication Sig Dispense Refill   albuterol (PROVENTIL HFA;VENTOLIN HFA) 108 (90 Base) MCG/ACT inhaler Inhale into the lungs every  6 (six) hours as needed for wheezing or shortness of breath.     aspirin 325 MG tablet Take 325 mg by mouth daily.     atorvastatin (LIPITOR) 40 MG tablet Take 40 mg by mouth daily.     Magnesium 400 MG CAPS Take 1 tablet by mouth daily.     metoprolol succinate (TOPROL-XL) 25 MG 24 hr tablet Take 25 mg by mouth daily.     TRELEGY ELLIPTA 100-62.5-25 MCG/ACT AEPB Inhale 1 puff into the lungs daily. 1 each 3   triamcinolone cream (KENALOG) 0.1 % Apply topically 2 (two) times daily.     triamcinolone cream (KENALOG) 0.1 % Apply topically 2 (two) times daily.     vitamin B-12 (CYANOCOBALAMIN) 1000 MCG tablet Take 1,000 mcg by mouth daily.     Vitamin E 268 MG (400 UNIT) CAPS Take by mouth.     azithromycin (ZITHROMAX) 250 MG tablet Take daily as directed 2 each 0   busPIRone (BUSPAR) 15 MG  tablet Take 15 mg by mouth 2 (two) times daily at 10 AM and 5 PM.  0   escitalopram (LEXAPRO) 10 MG tablet Take 10 mg by mouth daily.     potassium chloride (KLOR-CON) 10 MEQ tablet Take 10 mEq by mouth daily.     predniSONE (DELTASONE) 10 MG tablet Take 50 mg daily---taper by 10 mg daily then stop 15 tablet 0   No current facility-administered medications for this visit.      REVIEW OF SYSTEMS (Negative unless checked)  Constitutional: [] Weight loss  [] Fever  [] Chills Cardiac: [] Chest pain   [] Chest pressure   [] Palpitations   [] Shortness of breath when laying flat   [] Shortness of breath at rest   [] Shortness of breath with exertion. Vascular:  [x] Pain in legs with walking   [] Pain in legs at rest   [] Pain in legs when laying flat   [] Claudication   [] Pain in feet when walking  [x] Pain in feet at rest  [x] Pain in feet when laying flat   [] History of DVT   [] Phlebitis   [] Swelling in legs   [] Varicose veins   [] Non-healing ulcers Pulmonary:   [] Uses home oxygen   [] Productive cough   [] Hemoptysis   [] Wheeze  [] COPD   [] Asthma Neurologic:  [] Dizziness  [] Blackouts   [] Seizures   [] History of stroke   [] History of TIA  [] Aphasia   [] Temporary blindness   [] Dysphagia   [] Weakness or numbness in arms   [] Weakness or numbness in legs Musculoskeletal:  [x] Arthritis   [] Joint swelling   [x] Joint pain   [] Low back pain Hematologic:  [] Easy bruising  [] Easy bleeding   [] Hypercoagulable state   [] Anemic  [] Hepatitis Gastrointestinal:  [] Blood in stool   [] Vomiting blood  [x] Gastroesophageal reflux/heartburn   [] Abdominal pain Genitourinary:  [] Chronic kidney disease   [] Difficult urination  [] Frequent urination  [] Burning with urination   [] Hematuria Skin:  [] Rashes   [] Ulcers   [] Wounds Psychological:  [] History of anxiety   []  History of major depression.    Physical Exam BP 112/74    Pulse 75    Ht 5\' 2"  (1.575 m)    Wt 134 lb (60.8 kg)    BMI 24.51 kg/m  Gen:  WD/WN, NAD. Appear older than  stated age. Head: [place]/AT, No temporalis wasting.  Ear/Nose/Throat: Hearing grossly intact, nares w/o erythema or drainage, oropharynx w/o Erythema/Exudate Eyes: Conjunctiva clear, sclera non-icteric  Neck: trachea midline.  No JVD.  Pulmonary:  Good air movement, respirations not  labored, no use of accessory muscles  Cardiac: RRR, no JVD Vascular:  Vessel Right Left  Radial Palpable Palpable                          DP NP NP  PT NP trace   Gastrointestinal:. No masses, surgical incisions, or scars. Musculoskeletal: M/S 5/5 throughout.  Sluggish capillary refill.  No deformity or atrophy. Trace LE edema. Neurologic: Sensation grossly intact in extremities.  Symmetrical.  Speech is fluent. Motor exam as listed above. Psychiatric: Judgment intact, Mood & affect appropriate for pt's clinical situation. Dermatologic: No rashes or ulcers noted.  No cellulitis or open wounds.    Radiology No results found.  Labs Recent Results (from the past 2160 hour(s))  Lactic acid, plasma     Status: Abnormal   Collection Time: 05/26/21  2:46 PM  Result Value Ref Range   Lactic Acid, Venous 2.5 (HH) 0.5 - 1.9 mmol/L    Comment: CRITICAL RESULT CALLED TO, READ BACK BY AND VERIFIED WITH LINDSAY BLACK 05/26/21 1524 MU Performed at [facility], [address] [place], [place] [address]   Comprehensive metabolic panel     Status: Abnormal   Collection Time: 05/26/21  2:46 PM  Result Value Ref Range   Sodium 130 (L) 135 - 145 mmol/L   Potassium 4.1 3.5 - 5.1 mmol/L   Chloride 98 98 - 111 mmol/L   CO2 23 22 - 32 mmol/L   Glucose, Bld 117 (H) 70 - 99 mg/dL    Comment: Glucose reference range applies only to samples taken after fasting for at least 8 hours.   BUN 17 8 - 23 mg/dL   Creatinine, Ser 1.16 (H) 0.44 - 1.00 mg/dL   Calcium 8.3 (L) 8.9 - 10.3 mg/dL   Total Protein 6.8 6.5 - 8.1 g/dL   Albumin 3.8 3.5 - 5.0 g/dL   AST 59 (H) 15 - 41 U/L   ALT 30 0 - 44 U/L   Alkaline  Phosphatase 79 38 - 126 U/L   Total Bilirubin 0.6 0.3 - 1.2 mg/dL   GFR, Estimated 50 (L) >60 mL/min    Comment: (NOTE) Calculated using the CKD-EPI Creatinine Equation (2021)    Anion gap 9 5 - 15    Comment: Performed at [facility], [address] [place], [place] [address]  CBC with Differential     Status: Abnormal   Collection Time: 05/26/21  2:46 PM  Result Value Ref Range   WBC 8.3 4.0 - 10.5 K/uL   RBC 4.27 3.87 - 5.11 MIL/uL   Hemoglobin 12.9 12.0 - 15.0 g/dL   HCT 40.0 36.0 - 46.0 %   MCV 93.7 80.0 - 100.0 fL   MCH 30.2 26.0 - 34.0 pg   MCHC 32.3 30.0 - 36.0 g/dL   RDW 14.2 11.5 - 15.5 %   Platelets 150 150 - 400 K/uL   nRBC 0.0 0.0 - 0.2 %   Neutrophils Relative % 85 %   Neutro Abs 7.0 1.7 - 7.7 K/uL   Lymphocytes Relative 8 %   Lymphs Abs 0.6 (L) 0.7 - 4.0 K/uL   Monocytes Relative 7 %   Monocytes Absolute 0.6 0.1 - 1.0 K/uL   Eosinophils Relative 0 %   Eosinophils Absolute 0.0 0.0 - 0.5 K/uL   Basophils Relative 0 %   Basophils Absolute 0.0 0.0 - 0.1 K/uL   WBC Morphology MORPHOLOGY UNREMARKABLE    RBC Morphology  MORPHOLOGY UNREMARKABLE    Smear Review Normal platelet morphology    Immature Granulocytes 0 %   Abs Immature Granulocytes 0.03 0.00 - 0.07 K/uL    Comment: Performed at [facility], [address] [place], [place] [address]  Resp Panel by RT-PCR (Flu A&B, Covid) Nasopharyngeal Swab     Status: Abnormal   Collection Time: 05/26/21  2:46 PM   Specimen: Nasopharyngeal Swab; Nasopharyngeal(NP) swabs in vial transport medium  Result Value Ref Range   SARS Coronavirus 2 by RT PCR NEGATIVE NEGATIVE    Comment: (NOTE) SARS-CoV-2 target nucleic acids are NOT DETECTED.  The SARS-CoV-2 RNA is generally detectable in upper respiratory specimens during the acute phase of infection. The lowest concentration of SARS-CoV-2 viral copies this assay can detect is 138 copies/mL. A negative result does not preclude SARS-Cov-2 infection and  should not be used as the sole basis for treatment or other patient management decisions. A negative result may occur with  improper specimen collection/handling, submission of specimen other than nasopharyngeal swab, presence of viral mutation(s) within the areas targeted by this assay, and inadequate number of viral copies(<138 copies/mL). A negative result must be combined with clinical observations, patient history, and epidemiological information. The expected result is Negative.  Fact Sheet for Patients:  EntrepreneurPulse.com.au  Fact Sheet for Healthcare Providers:  IncredibleEmployment.be  This test is no t yet approved or cleared by the [place] FDA and  has been authorized for detection and/or diagnosis of SARS-CoV-2 by FDA under an Emergency Use Authorization (EUA). This EUA will remain  in effect (meaning this test can be used) for the duration of the COVID-19 declaration under Section 564(b)(1) of the Act, 21 U.S.C.section 360bbb-3(b)(1), unless the authorization is terminated  or revoked sooner.       Influenza A by PCR POSITIVE (A) NEGATIVE   Influenza B by PCR NEGATIVE NEGATIVE    Comment: (NOTE) The Xpert Xpress SARS-CoV-2/FLU/RSV plus assay is intended as an aid in the diagnosis of influenza from Nasopharyngeal swab specimens and should not be used as a sole basis for treatment. Nasal washings and aspirates are unacceptable for Xpert Xpress SARS-CoV-2/FLU/RSV testing.  Fact Sheet for Patients: EntrepreneurPulse.com.au  Fact Sheet for Healthcare Providers: IncredibleEmployment.be  This test is not yet approved or cleared by the [place] FDA and has been authorized for detection and/or diagnosis of SARS-CoV-2 by FDA under an Emergency Use Authorization (EUA). This EUA will remain in effect (meaning this test can be used) for the duration of the COVID-19 declaration under  Section 564(b)(1) of the Act, 21 U.S.C. section 360bbb-3(b)(1), unless the authorization is terminated or revoked.  Performed at [facility], [address] [place], [place] [address]   Protime-INR     Status: None   Collection Time: 05/26/21  3:30 PM  Result Value Ref Range   Prothrombin Time 14.1 11.4 - 15.2 seconds   INR 1.1 0.8 - 1.2    Comment: (NOTE) INR goal varies based on device and disease states. Performed at [facility], [address] [place], [place] [address]   APTT     Status: None   Collection Time: 05/26/21  3:30 PM  Result Value Ref Range   aPTT 36 24 - 36 seconds    Comment: Performed at [facility], [address] [place], [place] [address]  Blood Culture (routine x 2)     Status: None   Collection Time: 05/26/21  3:30 PM   Specimen: BLOOD  Result Value Ref Range   Specimen Description BLOOD LEFT ANTECUBITAL    Special Requests      BOTTLES DRAWN AEROBIC AND ANAEROBIC Blood Culture results may not be optimal due to an inadequate volume of blood received in culture bottles   Culture      NO GROWTH 5 DAYS Performed at [facility], [address], [place], [place] [address]    Report Status 05/31/2021 FINAL   Blood Culture (routine x 2)     Status: None   Collection Time: 05/26/21  3:30 PM   Specimen: BLOOD  Result Value Ref Range   Specimen Description BLOOD RIGHT ANTECUBITAL    Special Requests      BOTTLES DRAWN AEROBIC AND ANAEROBIC Blood Culture results may not be optimal due to an excessive volume of blood received in culture bottles   Culture      NO GROWTH 5 DAYS Performed at [facility], [address] [place], [place] [address]    Report Status 05/31/2021 FINAL   Lactic acid, plasma     Status: None   Collection Time: 05/26/21  6:29 PM  Result Value Ref Range   Lactic Acid, Venous 0.8 0.5 - 1.9 mmol/L    Comment: Performed at [facility], [address]  [place], [place] [address]  HIV Antibody (routine testing w rflx)     Status: None   Collection Time: 05/26/21  6:29 PM  Result Value Ref Range   HIV Screen 4th Generation wRfx Non Reactive Non Reactive    Comment: Performed at [facility], [address] [address], [place],  [address]  Procalcitonin - Baseline     Status: None   Collection Time: 05/26/21  6:29 PM  Result Value Ref Range   Procalcitonin 4.46 ng/mL    Comment:        Interpretation: PCT > 2 ng/mL: Systemic infection (sepsis) is likely, unless other causes are known. (NOTE)       Sepsis PCT Algorithm           Lower Respiratory Tract                                      Infection PCT Algorithm    ----------------------------     ----------------------------         PCT < 0.25 ng/mL                PCT < 0.10 ng/mL          Strongly encourage             Strongly discourage   discontinuation of antibiotics    initiation of antibiotics    ----------------------------     -----------------------------       PCT 0.25 - 0.50 ng/mL            PCT 0.10 - 0.25 ng/mL               OR       >80% decrease in PCT            Discourage initiation of                                            antibiotics      Encourage discontinuation  of antibiotics    ----------------------------     -----------------------------         PCT >= 0.50 ng/mL              PCT 0.26 - 0.50 ng/mL               AND       <80% decrease in PCT              Encourage initiation of                                             antibiotics       Encourage continuation           of antibiotics    ----------------------------     -----------------------------        PCT >= 0.50 ng/mL                  PCT > 0.50 ng/mL               AND         increase in PCT                  Strongly encourage                                      initiation of antibiotics    Strongly encourage escalation           of antibiotics                                      -----------------------------                                           PCT <= 0.25 ng/mL                                                 OR                                        > 80% decrease in PCT                                      Discontinue / Do not initiate                                             antibiotics  Performed at [facility], [address] [place], [place] [address]   CBC     Status: Abnormal   Collection Time: 05/27/21  7:04 AM  Result Value Ref Range   WBC 8.7 4.0 - 10.5 K/uL  RBC 3.85 (L) 3.87 - 5.11 MIL/uL   Hemoglobin 11.5 (L) 12.0 - 15.0 g/dL   HCT 35.6 (L) 36.0 - 46.0 %   MCV 92.5 80.0 - 100.0 fL   MCH 29.9 26.0 - 34.0 pg   MCHC 32.3 30.0 - 36.0 g/dL   RDW 14.1 11.5 - 15.5 %   Platelets 136 (L) 150 - 400 K/uL   nRBC 0.0 0.0 - 0.2 %    Comment: Performed at [facility], [address] [place], [place] [address]  Comprehensive metabolic panel     Status: Abnormal   Collection Time: 05/27/21  7:04 AM  Result Value Ref Range   Sodium 135 135 - 145 mmol/L   Potassium 4.2 3.5 - 5.1 mmol/L   Chloride 107 98 - 111 mmol/L   CO2 24 22 - 32 mmol/L   Glucose, Bld 146 (H) 70 - 99 mg/dL    Comment: Glucose reference range applies only to samples taken after fasting for at least 8 hours.   BUN 14 8 - 23 mg/dL   Creatinine, Ser 0.72 0.44 - 1.00 mg/dL   Calcium 7.9 (L) 8.9 - 10.3 mg/dL   Total Protein 6.1 (L) 6.5 - 8.1 g/dL   Albumin 3.2 (L) 3.5 - 5.0 g/dL   AST 67 (H) 15 - 41 U/L   ALT 32 0 - 44 U/L   Alkaline Phosphatase 66 38 - 126 U/L   Total Bilirubin 0.4 0.3 - 1.2 mg/dL   GFR, Estimated >60 >60 mL/min    Comment: (NOTE) Calculated using the CKD-EPI Creatinine Equation (2021)    Anion gap 4 (L) 5 - 15    Comment: Performed at [facility], [address], [place], [place] [address]  Magnesium     Status: None   Collection Time: 05/27/21  7:04 AM  Result Value Ref Range   Magnesium 2.0 1.7 - 2.4 mg/dL     Comment: Performed at [facility], [address] [place], [place] [address]  Procalcitonin     Status: None   Collection Time: 05/27/21  7:04 AM  Result Value Ref Range   Procalcitonin 3.79 ng/mL    Comment:        Interpretation: PCT > 2 ng/mL: Systemic infection (sepsis) is likely, unless other causes are known. (NOTE)       Sepsis PCT Algorithm           Lower Respiratory Tract                                      Infection PCT Algorithm    ----------------------------     ----------------------------         PCT < 0.25 ng/mL                PCT < 0.10 ng/mL          Strongly encourage             Strongly discourage   discontinuation of antibiotics    initiation of antibiotics    ----------------------------     -----------------------------       PCT 0.25 - 0.50 ng/mL            PCT 0.10 - 0.25 ng/mL               OR       >80% decrease in PCT  Discourage initiation of                                            antibiotics      Encourage discontinuation           of antibiotics    ----------------------------     -----------------------------         PCT >= 0.50 ng/mL              PCT 0.26 - 0.50 ng/mL               AND       <80% decrease in PCT              Encourage initiation of                                             antibiotics       Encourage continuation           of antibiotics    ----------------------------     -----------------------------        PCT >= 0.50 ng/mL                  PCT > 0.50 ng/mL               AND         increase in PCT                  Strongly encourage                                      initiation of antibiotics    Strongly encourage escalation           of antibiotics                                     -----------------------------                                           PCT <= 0.25 ng/mL                                                 OR                                        > 80% decrease in PCT                                       Discontinue / Do not initiate  antibiotics  Performed at [facility], [address] [place], [place] [address]   Procalcitonin     Status: None   Collection Time: 05/28/21  5:11 AM  Result Value Ref Range   Procalcitonin 2.63 ng/mL    Comment:        Interpretation: PCT > 2 ng/mL: Systemic infection (sepsis) is likely, unless other causes are known. (NOTE)       Sepsis PCT Algorithm           Lower Respiratory Tract                                      Infection PCT Algorithm    ----------------------------     ----------------------------         PCT < 0.25 ng/mL                PCT < 0.10 ng/mL          Strongly encourage             Strongly discourage   discontinuation of antibiotics    initiation of antibiotics    ----------------------------     -----------------------------       PCT 0.25 - 0.50 ng/mL            PCT 0.10 - 0.25 ng/mL               OR       >80% decrease in PCT            Discourage initiation of                                            antibiotics      Encourage discontinuation           of antibiotics    ----------------------------     -----------------------------         PCT >= 0.50 ng/mL              PCT 0.26 - 0.50 ng/mL               AND       <80% decrease in PCT              Encourage initiation of                                             antibiotics       Encourage continuation           of antibiotics    ----------------------------     -----------------------------        PCT >= 0.50 ng/mL                  PCT > 0.50 ng/mL               AND         increase in PCT                  Strongly encourage  initiation of antibiotics    Strongly encourage escalation           of antibiotics                                     -----------------------------                                           PCT <= 0.25 ng/mL                                                  OR                                        > 80% decrease in PCT                                      Discontinue / Do not initiate                                             antibiotics  Performed at [facility], [address] [place], [place] [address]   Brain natriuretic peptide     Status: Abnormal   Collection Time: 05/28/21  5:11 AM  Result Value Ref Range   B Natriuretic Peptide 300.3 (H) 0.0 - 100.0 pg/mL    Comment: Performed at [facility], [address] [place], [place] [address]    Assessment/Plan:  Pure hypercholesterolemia lipid control important in reducing the progression of atherosclerotic disease. Continue statin therapy   Essential hypertension blood pressure control important in reducing the progression of atherosclerotic disease. On appropriate oral medications.      [name] 08/07/2021, 12:31 PM   This note was created with Dragon medical transcription system.  Any errors from dictation are unintentional.

## 2021-08-04 NOTE — H&P (View-Only) (Signed)
Patient ID: Wendy Arias, female   DOB: 12/10/1950, 71 y.o.   MRN: [id number]  Chief Complaint  Patient presents with   Follow-up    U/S follow up    HPI Wendy Arias is a 71 y.o. female.  I am asked to see the patient by Wendy Arias for evaluation of severe lower extremity pain and PAD.  She had an abnormal ABI performed at her primary office and describes symptoms worrisome for rest pain particularly in the right foot.  She cannot sleep at night.  She has to get up and dangle her foot.  She has pain on the left side as well, but it is not as severe or intense.  She denies any fevers or chills.  This has been a progressive problem over many months without a clear cause or inciting event.  We performed noninvasive studies today which showed a markedly reduced ABI on the right of 0.34 and a fairly severely reduced ABI on the left at 0.53.  Digit pressures were 40 or less bilaterally.     Past Medical History:  Diagnosis Date   Anxiety    Asthma    COPD (chronic obstructive pulmonary disease) ([place])    Depression    Hypertension    Myocardial infarction [facility])    Osteoporosis     Past Surgical History:  Procedure Laterality Date   ABDOMINAL HYSTERECTOMY     partial   CARDIAC CATHETERIZATION     CARDIAC SURGERY     COLONOSCOPY WITH PROPOFOL N/A 04/12/2017   Procedure: COLONOSCOPY WITH PROPOFOL;  Surgeon: Lollie Sails, MD;  Location: [facility] ENDOSCOPY;  Service: Endoscopy;  Laterality: N/A;   CORONARY ANGIOPLASTY     ENDOBRONCHIAL ULTRASOUND N/A 11/28/2017   Procedure: ENDOBRONCHIAL ULTRASOUND;  Surgeon: Laverle Hobby, MD;  Location: [facility] ORS;  Service: Pulmonary;  Laterality: N/A;   REDUCTION MAMMAPLASTY       Family History  Problem Relation Age of Onset   Breast cancer Mother 18   Hypertension Mother    Dementia Mother    Breast cancer Maternal Aunt 40   Breast cancer Other 40   Hypertension Father    Heart disease Father    Diabetes Father    Diabetes  Sister       Social History   Tobacco Use   Smoking status: Some Days    Packs/day: 0.25    Years: 20.00    Pack years: 5.00    Types: Cigarettes   Smokeless tobacco: Never  Vaping Use   Vaping Use: Never used  Substance Use Topics   Alcohol use: Yes    Alcohol/week: 3.0 standard drinks    Types: 1 Glasses of wine, 2 Cans of beer per week    Comment: rarely   Drug use: Never     Allergies  Allergen Reactions   Penicillins Other (See Comments)    Has patient had a PCN reaction causing immediate rash, facial/tongue/throat swelling, SOB or lightheadedness with hypotension: Yes Has patient had a PCN reaction causing severe rash involving mucus membranes or skin necrosis: Unknown Has patient had a PCN reaction that required hospitalization: Unknown Has patient had a PCN reaction occurring within the last 10 years: Unknown If all of the above answers are "NO", then may proceed with Cephalosporin use. Pt has taken augmentin before    Current Outpatient Medications  Medication Sig Dispense Refill   albuterol (PROVENTIL HFA;VENTOLIN HFA) 108 (90 Base) MCG/ACT inhaler Inhale into the lungs every  6 (six) hours as needed for wheezing or shortness of breath.     aspirin 325 MG tablet Take 325 mg by mouth daily.     atorvastatin (LIPITOR) 40 MG tablet Take 40 mg by mouth daily.     Magnesium 400 MG CAPS Take 1 tablet by mouth daily.     metoprolol succinate (TOPROL-XL) 25 MG 24 hr tablet Take 25 mg by mouth daily.     TRELEGY ELLIPTA 100-62.5-25 MCG/ACT AEPB Inhale 1 puff into the lungs daily. 1 each 3   triamcinolone cream (KENALOG) 0.1 % Apply topically 2 (two) times daily.     triamcinolone cream (KENALOG) 0.1 % Apply topically 2 (two) times daily.     vitamin B-12 (CYANOCOBALAMIN) 1000 MCG tablet Take 1,000 mcg by mouth daily.     Vitamin E 268 MG (400 UNIT) CAPS Take by mouth.     azithromycin (ZITHROMAX) 250 MG tablet Take daily as directed 2 each 0   busPIRone (BUSPAR) 15 MG  tablet Take 15 mg by mouth 2 (two) times daily at 10 AM and 5 PM.  0   escitalopram (LEXAPRO) 10 MG tablet Take 10 mg by mouth daily.     potassium chloride (KLOR-CON) 10 MEQ tablet Take 10 mEq by mouth daily.     predniSONE (DELTASONE) 10 MG tablet Take 50 mg daily---taper by 10 mg daily then stop 15 tablet 0   No current facility-administered medications for this visit.      REVIEW OF SYSTEMS (Negative unless checked)  Constitutional: [] Weight loss  [] Fever  [] Chills Cardiac: [] Chest pain   [] Chest pressure   [] Palpitations   [] Shortness of breath when laying flat   [] Shortness of breath at rest   [] Shortness of breath with exertion. Vascular:  [x] Pain in legs with walking   [] Pain in legs at rest   [] Pain in legs when laying flat   [] Claudication   [] Pain in feet when walking  [x] Pain in feet at rest  [x] Pain in feet when laying flat   [] History of DVT   [] Phlebitis   [] Swelling in legs   [] Varicose veins   [] Non-healing ulcers Pulmonary:   [] Uses home oxygen   [] Productive cough   [] Hemoptysis   [] Wheeze  [] COPD   [] Asthma Neurologic:  [] Dizziness  [] Blackouts   [] Seizures   [] History of stroke   [] History of TIA  [] Aphasia   [] Temporary blindness   [] Dysphagia   [] Weakness or numbness in arms   [] Weakness or numbness in legs Musculoskeletal:  [x] Arthritis   [] Joint swelling   [x] Joint pain   [] Low back pain Hematologic:  [] Easy bruising  [] Easy bleeding   [] Hypercoagulable state   [] Anemic  [] Hepatitis Gastrointestinal:  [] Blood in stool   [] Vomiting blood  [x] Gastroesophageal reflux/heartburn   [] Abdominal pain Genitourinary:  [] Chronic kidney disease   [] Difficult urination  [] Frequent urination  [] Burning with urination   [] Hematuria Skin:  [] Rashes   [] Ulcers   [] Wounds Psychological:  [] History of anxiety   []  History of major depression.    Physical Exam BP 112/74    Pulse 75    Ht 5\' 2"  (1.575 m)    Wt 134 lb (60.8 kg)    BMI 24.51 kg/m  Gen:  WD/WN, NAD. Appear older than  stated age. Head: [place]/AT, No temporalis wasting.  Ear/Nose/Throat: Hearing grossly intact, nares w/o erythema or drainage, oropharynx w/o Erythema/Exudate Eyes: Conjunctiva clear, sclera non-icteric  Neck: trachea midline.  No JVD.  Pulmonary:  Good air movement, respirations not  labored, no use of accessory muscles  Cardiac: RRR, no JVD Vascular:  Vessel Right Left  Radial Palpable Palpable                          DP NP NP  PT NP trace   Gastrointestinal:. No masses, surgical incisions, or scars. Musculoskeletal: M/S 5/5 throughout.  Sluggish capillary refill.  No deformity or atrophy. Trace LE edema. Neurologic: Sensation grossly intact in extremities.  Symmetrical.  Speech is fluent. Motor exam as listed above. Psychiatric: Judgment intact, Mood & affect appropriate for pt's clinical situation. Dermatologic: No rashes or ulcers noted.  No cellulitis or open wounds.    Radiology No results found.  Labs Recent Results (from the past 2160 hour(s))  Lactic acid, plasma     Status: Abnormal   Collection Time: 05/26/21  2:46 PM  Result Value Ref Range   Lactic Acid, Venous 2.5 (HH) 0.5 - 1.9 mmol/L    Comment: CRITICAL RESULT CALLED TO, READ BACK BY AND VERIFIED WITH LINDSAY BLACK 05/26/21 1524 MU Performed at [facility], [address] [place], [place] [address]   Comprehensive metabolic panel     Status: Abnormal   Collection Time: 05/26/21  2:46 PM  Result Value Ref Range   Sodium 130 (L) 135 - 145 mmol/L   Potassium 4.1 3.5 - 5.1 mmol/L   Chloride 98 98 - 111 mmol/L   CO2 23 22 - 32 mmol/L   Glucose, Bld 117 (H) 70 - 99 mg/dL    Comment: Glucose reference range applies only to samples taken after fasting for at least 8 hours.   BUN 17 8 - 23 mg/dL   Creatinine, Ser 1.16 (H) 0.44 - 1.00 mg/dL   Calcium 8.3 (L) 8.9 - 10.3 mg/dL   Total Protein 6.8 6.5 - 8.1 g/dL   Albumin 3.8 3.5 - 5.0 g/dL   AST 59 (H) 15 - 41 U/L   ALT 30 0 - 44 U/L   Alkaline  Phosphatase 79 38 - 126 U/L   Total Bilirubin 0.6 0.3 - 1.2 mg/dL   GFR, Estimated 50 (L) >60 mL/min    Comment: (NOTE) Calculated using the CKD-EPI Creatinine Equation (2021)    Anion gap 9 5 - 15    Comment: Performed at [facility], [address] [place], [place] [address]  CBC with Differential     Status: Abnormal   Collection Time: 05/26/21  2:46 PM  Result Value Ref Range   WBC 8.3 4.0 - 10.5 K/uL   RBC 4.27 3.87 - 5.11 MIL/uL   Hemoglobin 12.9 12.0 - 15.0 g/dL   HCT 40.0 36.0 - 46.0 %   MCV 93.7 80.0 - 100.0 fL   MCH 30.2 26.0 - 34.0 pg   MCHC 32.3 30.0 - 36.0 g/dL   RDW 14.2 11.5 - 15.5 %   Platelets 150 150 - 400 K/uL   nRBC 0.0 0.0 - 0.2 %   Neutrophils Relative % 85 %   Neutro Abs 7.0 1.7 - 7.7 K/uL   Lymphocytes Relative 8 %   Lymphs Abs 0.6 (L) 0.7 - 4.0 K/uL   Monocytes Relative 7 %   Monocytes Absolute 0.6 0.1 - 1.0 K/uL   Eosinophils Relative 0 %   Eosinophils Absolute 0.0 0.0 - 0.5 K/uL   Basophils Relative 0 %   Basophils Absolute 0.0 0.0 - 0.1 K/uL   WBC Morphology MORPHOLOGY UNREMARKABLE    RBC Morphology  MORPHOLOGY UNREMARKABLE    Smear Review Normal platelet morphology    Immature Granulocytes 0 %   Abs Immature Granulocytes 0.03 0.00 - 0.07 K/uL    Comment: Performed at [facility], [address] [place], [place] [address]  Resp Panel by RT-PCR (Flu A&B, Covid) Nasopharyngeal Swab     Status: Abnormal   Collection Time: 05/26/21  2:46 PM   Specimen: Nasopharyngeal Swab; Nasopharyngeal(NP) swabs in vial transport medium  Result Value Ref Range   SARS Coronavirus 2 by RT PCR NEGATIVE NEGATIVE    Comment: (NOTE) SARS-CoV-2 target nucleic acids are NOT DETECTED.  The SARS-CoV-2 RNA is generally detectable in upper respiratory specimens during the acute phase of infection. The lowest concentration of SARS-CoV-2 viral copies this assay can detect is 138 copies/mL. A negative result does not preclude SARS-Cov-2 infection and  should not be used as the sole basis for treatment or other patient management decisions. A negative result may occur with  improper specimen collection/handling, submission of specimen other than nasopharyngeal swab, presence of viral mutation(s) within the areas targeted by this assay, and inadequate number of viral copies(<138 copies/mL). A negative result must be combined with clinical observations, patient history, and epidemiological information. The expected result is Negative.  Fact Sheet for Patients:  EntrepreneurPulse.com.au  Fact Sheet for Healthcare Providers:  IncredibleEmployment.be  This test is no t yet approved or cleared by the [place] FDA and  has been authorized for detection and/or diagnosis of SARS-CoV-2 by FDA under an Emergency Use Authorization (EUA). This EUA will remain  in effect (meaning this test can be used) for the duration of the COVID-19 declaration under Section 564(b)(1) of the Act, 21 U.S.C.section 360bbb-3(b)(1), unless the authorization is terminated  or revoked sooner.       Influenza A by PCR POSITIVE (A) NEGATIVE   Influenza B by PCR NEGATIVE NEGATIVE    Comment: (NOTE) The Xpert Xpress SARS-CoV-2/FLU/RSV plus assay is intended as an aid in the diagnosis of influenza from Nasopharyngeal swab specimens and should not be used as a sole basis for treatment. Nasal washings and aspirates are unacceptable for Xpert Xpress SARS-CoV-2/FLU/RSV testing.  Fact Sheet for Patients: EntrepreneurPulse.com.au  Fact Sheet for Healthcare Providers: IncredibleEmployment.be  This test is not yet approved or cleared by the [place] FDA and has been authorized for detection and/or diagnosis of SARS-CoV-2 by FDA under an Emergency Use Authorization (EUA). This EUA will remain in effect (meaning this test can be used) for the duration of the COVID-19 declaration under  Section 564(b)(1) of the Act, 21 U.S.C. section 360bbb-3(b)(1), unless the authorization is terminated or revoked.  Performed at [facility], [address] [place], [place] [address]   Protime-INR     Status: None   Collection Time: 05/26/21  3:30 PM  Result Value Ref Range   Prothrombin Time 14.1 11.4 - 15.2 seconds   INR 1.1 0.8 - 1.2    Comment: (NOTE) INR goal varies based on device and disease states. Performed at [facility], [address] [place], [place] [address]   APTT     Status: None   Collection Time: 05/26/21  3:30 PM  Result Value Ref Range   aPTT 36 24 - 36 seconds    Comment: Performed at [facility], [address] [place], [place] [address]  Blood Culture (routine x 2)     Status: None   Collection Time: 05/26/21  3:30 PM   Specimen: BLOOD  Result Value Ref Range   Specimen Description BLOOD LEFT ANTECUBITAL    Special Requests      BOTTLES DRAWN AEROBIC AND ANAEROBIC Blood Culture results may not be optimal due to an inadequate volume of blood received in culture bottles   Culture      NO GROWTH 5 DAYS Performed at [facility], [address] [place], [place] [address]    Report Status 05/31/2021 FINAL   Blood Culture (routine x 2)     Status: None   Collection Time: 05/26/21  3:30 PM   Specimen: BLOOD  Result Value Ref Range   Specimen Description BLOOD RIGHT ANTECUBITAL    Special Requests      BOTTLES DRAWN AEROBIC AND ANAEROBIC Blood Culture results may not be optimal due to an excessive volume of blood received in culture bottles   Culture      NO GROWTH 5 DAYS Performed at [facility], [address] [place], [place] [address]    Report Status 05/31/2021 FINAL   Lactic acid, plasma     Status: None   Collection Time: 05/26/21  6:29 PM  Result Value Ref Range   Lactic Acid, Venous 0.8 0.5 - 1.9 mmol/L    Comment: Performed at [facility], [address]  [place], [place] [address]  HIV Antibody (routine testing w rflx)     Status: None   Collection Time: 05/26/21  6:29 PM  Result Value Ref Range   HIV Screen 4th Generation wRfx Non Reactive Non Reactive    Comment: Performed at [facility], [place] [address], [place], [place] [address]  Procalcitonin - Baseline     Status: None   Collection Time: 05/26/21  6:29 PM  Result Value Ref Range   Procalcitonin 4.46 ng/mL    Comment:        Interpretation: PCT > 2 ng/mL: Systemic infection (sepsis) is likely, unless other causes are known. (NOTE)       Sepsis PCT Algorithm           Lower Respiratory Tract                                      Infection PCT Algorithm    ----------------------------     ----------------------------         PCT < 0.25 ng/mL                PCT < 0.10 ng/mL          Strongly encourage             Strongly discourage   discontinuation of antibiotics    initiation of antibiotics    ----------------------------     -----------------------------       PCT 0.25 - 0.50 ng/mL            PCT 0.10 - 0.25 ng/mL               OR       >80% decrease in PCT            Discourage initiation of                                            antibiotics      Encourage discontinuation  of antibiotics    ----------------------------     -----------------------------         PCT >= 0.50 ng/mL              PCT 0.26 - 0.50 ng/mL               AND       <80% decrease in PCT              Encourage initiation of                                             antibiotics       Encourage continuation           of antibiotics    ----------------------------     -----------------------------        PCT >= 0.50 ng/mL                  PCT > 0.50 ng/mL               AND         increase in PCT                  Strongly encourage                                      initiation of antibiotics    Strongly encourage escalation           of antibiotics                                      -----------------------------                                           PCT <= 0.25 ng/mL                                                 OR                                        > 80% decrease in PCT                                      Discontinue / Do not initiate                                             antibiotics  Performed at [facility], [address] [place], [place] [address]   CBC     Status: Abnormal   Collection Time: 05/27/21  7:04 AM  Result Value Ref Range   WBC 8.7 4.0 - 10.5 K/uL  RBC 3.85 (L) 3.87 - 5.11 MIL/uL   Hemoglobin 11.5 (L) 12.0 - 15.0 g/dL   HCT 35.6 (L) 36.0 - 46.0 %   MCV 92.5 80.0 - 100.0 fL   MCH 29.9 26.0 - 34.0 pg   MCHC 32.3 30.0 - 36.0 g/dL   RDW 14.1 11.5 - 15.5 %   Platelets 136 (L) 150 - 400 K/uL   nRBC 0.0 0.0 - 0.2 %    Comment: Performed at [facility], [address] [place], [place] [address]  Comprehensive metabolic panel     Status: Abnormal   Collection Time: 05/27/21  7:04 AM  Result Value Ref Range   Sodium 135 135 - 145 mmol/L   Potassium 4.2 3.5 - 5.1 mmol/L   Chloride 107 98 - 111 mmol/L   CO2 24 22 - 32 mmol/L   Glucose, Bld 146 (H) 70 - 99 mg/dL    Comment: Glucose reference range applies only to samples taken after fasting for at least 8 hours.   BUN 14 8 - 23 mg/dL   Creatinine, Ser 0.72 0.44 - 1.00 mg/dL   Calcium 7.9 (L) 8.9 - 10.3 mg/dL   Total Protein 6.1 (L) 6.5 - 8.1 g/dL   Albumin 3.2 (L) 3.5 - 5.0 g/dL   AST 67 (H) 15 - 41 U/L   ALT 32 0 - 44 U/L   Alkaline Phosphatase 66 38 - 126 U/L   Total Bilirubin 0.4 0.3 - 1.2 mg/dL   GFR, Estimated >60 >60 mL/min    Comment: (NOTE) Calculated using the CKD-EPI Creatinine Equation (2021)    Anion gap 4 (L) 5 - 15    Comment: Performed at [facility], [address], [place], [place] [address]  Magnesium     Status: None   Collection Time: 05/27/21  7:04 AM  Result Value Ref Range   Magnesium 2.0 1.7 - 2.4 mg/dL     Comment: Performed at [facility], [address] [place], [place] [address]  Procalcitonin     Status: None   Collection Time: 05/27/21  7:04 AM  Result Value Ref Range   Procalcitonin 3.79 ng/mL    Comment:        Interpretation: PCT > 2 ng/mL: Systemic infection (sepsis) is likely, unless other causes are known. (NOTE)       Sepsis PCT Algorithm           Lower Respiratory Tract                                      Infection PCT Algorithm    ----------------------------     ----------------------------         PCT < 0.25 ng/mL                PCT < 0.10 ng/mL          Strongly encourage             Strongly discourage   discontinuation of antibiotics    initiation of antibiotics    ----------------------------     -----------------------------       PCT 0.25 - 0.50 ng/mL            PCT 0.10 - 0.25 ng/mL               OR       >80% decrease in PCT  Discourage initiation of                                            antibiotics      Encourage discontinuation           of antibiotics    ----------------------------     -----------------------------         PCT >= 0.50 ng/mL              PCT 0.26 - 0.50 ng/mL               AND       <80% decrease in PCT              Encourage initiation of                                             antibiotics       Encourage continuation           of antibiotics    ----------------------------     -----------------------------        PCT >= 0.50 ng/mL                  PCT > 0.50 ng/mL               AND         increase in PCT                  Strongly encourage                                      initiation of antibiotics    Strongly encourage escalation           of antibiotics                                     -----------------------------                                           PCT <= 0.25 ng/mL                                                 OR                                        > 80% decrease in PCT                                       Discontinue / Do not initiate  antibiotics  Performed at [facility], [address] [place], [place] [address]   Procalcitonin     Status: None   Collection Time: 05/28/21  5:11 AM  Result Value Ref Range   Procalcitonin 2.63 ng/mL    Comment:        Interpretation: PCT > 2 ng/mL: Systemic infection (sepsis) is likely, unless other causes are known. (NOTE)       Sepsis PCT Algorithm           Lower Respiratory Tract                                      Infection PCT Algorithm    ----------------------------     ----------------------------         PCT < 0.25 ng/mL                PCT < 0.10 ng/mL          Strongly encourage             Strongly discourage   discontinuation of antibiotics    initiation of antibiotics    ----------------------------     -----------------------------       PCT 0.25 - 0.50 ng/mL            PCT 0.10 - 0.25 ng/mL               OR       >80% decrease in PCT            Discourage initiation of                                            antibiotics      Encourage discontinuation           of antibiotics    ----------------------------     -----------------------------         PCT >= 0.50 ng/mL              PCT 0.26 - 0.50 ng/mL               AND       <80% decrease in PCT              Encourage initiation of                                             antibiotics       Encourage continuation           of antibiotics    ----------------------------     -----------------------------        PCT >= 0.50 ng/mL                  PCT > 0.50 ng/mL               AND         increase in PCT                  Strongly encourage  initiation of antibiotics    Strongly encourage escalation           of antibiotics                                     -----------------------------                                           PCT <= 0.25 ng/mL                                                  OR                                        > 80% decrease in PCT                                      Discontinue / Do not initiate                                             antibiotics  Performed at [facility], [address] [place], [place] [address]   Brain natriuretic peptide     Status: Abnormal   Collection Time: 05/28/21  5:11 AM  Result Value Ref Range   B Natriuretic Peptide 300.3 (H) 0.0 - 100.0 pg/mL    Comment: Performed at [facility], [address] [place], [place] [address]    Assessment/Plan:  Pure hypercholesterolemia lipid control important in reducing the progression of atherosclerotic disease. Continue statin therapy   Essential hypertension blood pressure control important in reducing the progression of atherosclerotic disease. On appropriate oral medications.      [name] 08/07/2021, 12:31 PM   This note was created with Dragon medical transcription system.  Any errors from dictation are unintentional.

## 2021-08-05 NOTE — Assessment & Plan Note (Signed)
blood pressure control important in reducing the progression of atherosclerotic disease. On appropriate oral medications.  

## 2021-08-05 NOTE — Assessment & Plan Note (Signed)
lipid control important in reducing the progression of atherosclerotic disease. Continue statin therapy  

## 2021-08-06 ENCOUNTER — Encounter (INDEPENDENT_AMBULATORY_CARE_PROVIDER_SITE_OTHER): Payer: Self-pay | Admitting: Vascular Surgery

## 2021-08-07 DIAGNOSIS — I70229 Atherosclerosis of native arteries of extremities with rest pain, unspecified extremity: Secondary | ICD-10-CM | POA: Insufficient documentation

## 2021-08-07 DIAGNOSIS — L97909 Non-pressure chronic ulcer of unspecified part of unspecified lower leg with unspecified severity: Secondary | ICD-10-CM | POA: Insufficient documentation

## 2021-08-07 NOTE — Assessment & Plan Note (Signed)
We performed noninvasive studies today which showed a markedly reduced ABI on the right of 0.34 and a fairly severely reduced ABI on the left at 0.53.  Digit pressures were 40 or less bilaterally.    Recommend:  The patient has evidence of severe atherosclerotic changes of both lower extremities with rest pain that is associated with preulcerative changes and impending tissue loss of the foot.  This represents a limb threatening ischemia and places the patient at the risk for limb loss.  Patient should undergo angiography of the lower extremities with the hope for intervention for limb salvage.  The risks and benefits as well as the alternative therapies was discussed in detail with the patient.  All questions were answered.  Patient agrees to proceed with angiography.  The patient will follow up with me in the office after the procedure.

## 2021-08-07 NOTE — Patient Instructions (Signed)
Peripheral Vascular Disease Peripheral vascular disease (PVD) is a disease of the blood vessels. PVD may also be called peripheral artery disease (PAD) or poor circulation. PVD is the blocking or hardening of the arteries anywhere within the circulatory system beyond the heart. This can result in a decreased supply of blood to the arms, legs, and internal organs, such as the stomach or kidneys. However, PVD most often affects a person's lower legs and feet. Without treatment, PVD often worsens. PVD can lead to acute limb ischemia. This occurs when an arm or leg suddenly has trouble getting enough blood. This is a medical emergency. What are the causes? The most common cause of PVD is atherosclerosis. This is a buildup of fatty material and other substances (plaque)inside your arteries. Pieces of plaque can break off from the walls of an artery and become stuck in a smaller artery, blocking blood flow and possibly causing acute limb ischemia. Other common causes of PVD include: Blood clots that form inside the blood vessels. Injuries to blood vessels. Diseases that cause inflammation of blood vessels or cause blood vessel tightening (spasms). What increases the risk? The following factors may make you more likely to develop this condition: A family history of PVD. Common medical conditions, including: High cholesterol. Diabetes. High blood pressure (hypertension). Heart disease. Known atherosclerotic disease in another area of the body. Past injury, such as burns or a broken bone. Other medical conditions, such as: Buerger's disease. This is caused by inflamed blood vessels in your hands and feet. Some forms of arthritis. Birth defects that affect the arteries in your legs. Kidney disease. Using tobacco and nicotine products. Not getting enough exercise. Obesity. Being age 33 or older, or being age 87 or older and having the other risk factors. What are the signs or symptoms? This  condition may cause different symptoms. Your symptoms depend on what body part is not getting enough blood. Common signs and symptoms include: Cramps in your buttocks, legs, and feet. Intermittent claudication. This is pain and weakness in your legs during activity that resolves with rest. Leg pain at rest and leg numbness, tingling, or weakness. Coldness in a leg or foot, especially when compared to the other leg or foot. Skin or hair changes. These can include: Hair loss. Shiny skin. Pale or bluish skin. Thick toenails. Inability to get or maintain an erection (erectile dysfunction). Tiredness (fatigue). Weak pulse or no pulse in the feet. People with PVD are more likely to develop open wounds (ulcers) and sores on their toes, feet, or legs. The ulcers or sores may take longer than normal to heal. How is this diagnosed? PVD is diagnosed based on your signs and symptoms, a physical exam, and your medical history. You may also have other tests to find the cause. Tests include: Ankle-brachial index test.This test compares the blood pressure readings of the legs and arms. This may also include an exercise ankle-brachial index test in which you walk on a treadmill to check your symptoms. Doppler ultrasound. This takes pictures of blood flow through your blood vessels. Imaging studies that use dye to show blood flow. These are: CT angiogram. Magnetic resonance angiogram, or MRA. How is this treated? Treatment for PVD depends on the cause of your condition, how severe your symptoms are, and your age. Underlying causes need to be treated and controlled. These include long-term (chronic) conditions, such as diabetes, high cholesterol, and hypertension. Treatment may include: Lifestyle changes, such as: Quitting tobacco use. Exercising regularly. Following a low-fat,  low-cholesterol diet. Not drinking alcohol. Taking medicines, such as: Blood thinners to prevent blood clots. Medicines to  improve blood flow. Medicines to improve cholesterol levels. Procedures, such as: Angioplasty. This uses an inflated balloon to open a blocked artery and improve blood flow. Stent implant. This inserts a small mesh tube to keep a blocked artery open. Peripheral bypass surgery. This reroutes blood flow around a blocked artery. Surgery to remove dead tissue from an infected wound (debridement). Amputation. This is surgical removal of the affected limb. It may be necessary in cases of acute limb ischemia when medical or surgical treatments have not helped. Follow these instructions at home: Medicines Take over-the-counter and prescription medicines only as told by your health care provider. If you are taking blood thinners: Talk with your health care provider before you take any medicines that contain aspirin or NSAIDs, such as ibuprofen. These medicines increase your risk for dangerous bleeding. Take your medicine exactly as told, at the same time every day. Avoid activities that could cause injury or bruising, and follow instructions about how to prevent falls. Wear a medical alert bracelet or carry a card that lists what medicines you take. Lifestyle   Exercise regularly. Ask your health care provider about some good activities for you. Talk with your health care provider about maintaining a healthy weight. If needed, ask about losing weight. Eat a diet that is low in fat and cholesterol. If you need help, talk with your health care provider. Do not drink alcohol. Do not use any products that contain nicotine or tobacco. These products include cigarettes, chewing tobacco, and vaping devices, such as e-cigarettes. If you need help quitting, ask your health care provider. General instructions Take good care of your feet. To do this: Wear comfortable shoes that fit well. Check your feet often for any cuts or sores. Get an annual influenza vaccine. Keep all follow-up visits. This is  important. Where to find more information Society for Vascular Surgery: vascular.org American Heart Association: heart.org National Heart, Lung, and Blood Institute: https://www.hartman-hill.biz/ Contact a health care provider if: You have leg cramps while walking. You have leg pain when you rest. Your leg or foot feels cold. Your skin changes color. You have erectile dysfunction. You have cuts or sores on your legs or feet that do not heal. Get help right away if: You have sudden changes in color and feeling of your arms or legs, such as: Your arm or leg turns cold, numb, and blue. Your arm or leg becomes red, warm, swollen, painful, or numb. You have any symptoms of a stroke. "BE FAST" is an easy way to remember the main warning signs of a stroke: B - Balance. Signs are dizziness, sudden trouble walking, or loss of balance. E - Eyes. Signs are trouble seeing or a sudden change in vision. F - Face. Signs are sudden weakness or numbness of the face, or the face or eyelid drooping on one side. A - Arms. Signs are weakness or numbness in an arm. This happens suddenly and usually on one side of the body. S - Speech. Signs are sudden trouble speaking, slurred speech, or trouble understanding what people say. T - Time. Time to call emergency services. Write down what time symptoms started. You have other signs of a stroke, such as: A sudden, severe headache with no known cause. Nausea or vomiting. Seizure. You have chest pain or trouble breathing. These symptoms may represent a serious problem that is an emergency. Do not wait  to see if the symptoms will go away. Get medical help right away. Call your local emergency services (911 in the [place] Do not drive yourself to the hospital. Summary Peripheral vascular disease (PVD) is a disease of the blood vessels. PVD is the blocking or hardening of the arteries anywhere within the circulatory system beyond the heart. PVD may cause different symptoms. Your  symptoms depend on what part of your body is not getting enough blood. Treatment for PVD depends on what caused it, how severe your symptoms are, and your age. This information is not intended to replace advice given to you by your health care provider. Make sure you discuss any questions you have with your health care provider. Document Revised: 12/17/2019 Document Reviewed: 12/17/2019 Elsevier Patient Education  [place].

## 2021-08-10 ENCOUNTER — Telehealth (INDEPENDENT_AMBULATORY_CARE_PROVIDER_SITE_OTHER): Payer: Self-pay

## 2021-08-10 NOTE — Telephone Encounter (Signed)
Spoke with the patient and she is scheduled with Dr. Lucky Cowboy for a RLE (08/12/21) and LLE (08/17/21) with a 8:15 am arrival and 7:45 am arrival time to the [facility]. Pre-procedure instructions were discussed and will be mailed for the 08/17/21 angio.

## 2021-08-12 ENCOUNTER — Other Ambulatory Visit: Payer: Self-pay

## 2021-08-12 ENCOUNTER — Encounter: Payer: Self-pay | Admitting: Vascular Surgery

## 2021-08-12 ENCOUNTER — Encounter
Admission: RE | Disposition: A | Discharge status: Home / Self Care | Payer: Self-pay | Source: Home / Self Care | Attending: Vascular Surgery

## 2021-08-12 ENCOUNTER — Other Ambulatory Visit (INDEPENDENT_AMBULATORY_CARE_PROVIDER_SITE_OTHER): Payer: Self-pay | Admitting: Nurse Practitioner

## 2021-08-12 ENCOUNTER — Ambulatory Visit
Admission: RE | Admit: 2021-08-12 | Discharge: 2021-08-12 | Disposition: A | Discharge status: Home / Self Care | Payer: PPO | Attending: Vascular Surgery | Admitting: Vascular Surgery

## 2021-08-12 DIAGNOSIS — F17210 Nicotine dependence, cigarettes, uncomplicated: Secondary | ICD-10-CM | POA: Diagnosis not present

## 2021-08-12 DIAGNOSIS — I70229 Atherosclerosis of native arteries of extremities with rest pain, unspecified extremity: Secondary | ICD-10-CM

## 2021-08-12 DIAGNOSIS — E7800 Pure hypercholesterolemia, unspecified: Secondary | ICD-10-CM | POA: Diagnosis not present

## 2021-08-12 DIAGNOSIS — I10 Essential (primary) hypertension: Secondary | ICD-10-CM | POA: Insufficient documentation

## 2021-08-12 DIAGNOSIS — I70221 Atherosclerosis of native arteries of extremities with rest pain, right leg: Secondary | ICD-10-CM | POA: Insufficient documentation

## 2021-08-12 DIAGNOSIS — I70223 Atherosclerosis of native arteries of extremities with rest pain, bilateral legs: Secondary | ICD-10-CM

## 2021-08-12 HISTORY — PX: LOWER EXTREMITY ANGIOGRAPHY: CATH118251

## 2021-08-12 LAB — CREATININE, SERUM
Creatinine, Ser: 0.73 mg/dL (ref 0.44–1.00)
GFR, Estimated: >60 mL/min (ref >60)

## 2021-08-12 LAB — BUN: BUN: 28 mg/dL — ABNORMAL HIGH (ref 8–23)

## 2021-08-12 SURGERY — LOWER EXTREMITY ANGIOGRAPHY
Anesthesia: Moderate Sedation | Site: Leg Lower | Laterality: Right

## 2021-08-12 MED ORDER — MIDAZOLAM HCL 2 MG/ML PO SYRP: 8.0000 mg | ORAL_SOLUTION | Freq: Once | ORAL | Status: DC | PRN

## 2021-08-12 MED ORDER — HYDROMORPHONE HCL 1 MG/ML IJ SOLN
INTRAMUSCULAR | Status: AC
  Administered 2021-08-12: 0.5 mg via INTRAVENOUS
  Filled 2021-08-12: qty 0.5

## 2021-08-12 MED ORDER — ONDANSETRON HCL 4 MG/2ML IJ SOLN: 4.0000 mg | Freq: Four times a day (QID) | INTRAMUSCULAR | Status: DC | PRN

## 2021-08-12 MED ORDER — DIPHENHYDRAMINE HCL 50 MG/ML IJ SOLN: 50.0000 mg | Freq: Once | INTRAMUSCULAR | Status: DC | PRN

## 2021-08-12 MED ORDER — CLINDAMYCIN PHOSPHATE 300 MG/50ML IV SOLN: 300.0000 mg | Freq: Once | INTRAVENOUS | Status: AC

## 2021-08-12 MED ORDER — ASPIRIN EC 81 MG PO TBEC: 81.0000 mg | DELAYED_RELEASE_TABLET | Freq: Every day | ORAL | 2 refills | Status: DC

## 2021-08-12 MED ORDER — FENTANYL CITRATE PF 50 MCG/ML IJ SOSY
PREFILLED_SYRINGE | INTRAMUSCULAR | Status: AC
  Filled 2021-08-12: qty 2

## 2021-08-12 MED ORDER — FENTANYL CITRATE (PF) 100 MCG/2ML IJ SOLN
INTRAMUSCULAR | Status: DC | PRN
  Administered 2021-08-12: 50 ug via INTRAVENOUS
  Administered 2021-08-12: 25 ug via INTRAVENOUS

## 2021-08-12 MED ORDER — SODIUM CHLORIDE 0.9 % IV BOLUS
INTRAVENOUS | Status: DC | PRN
  Administered 2021-08-12: 500 mL via INTRAVENOUS

## 2021-08-12 MED ORDER — FAMOTIDINE 20 MG PO TABS: 40.0000 mg | ORAL_TABLET | Freq: Once | ORAL | Status: DC | PRN

## 2021-08-12 MED ORDER — MIDAZOLAM HCL 2 MG/2ML IJ SOLN
INTRAMUSCULAR | Status: DC | PRN
  Administered 2021-08-12: 1 mg via INTRAVENOUS
  Administered 2021-08-12: 2 mg via INTRAVENOUS

## 2021-08-12 MED ORDER — HYDROMORPHONE HCL 1 MG/ML IJ SOLN: 1.0000 mg | Freq: Once | INTRAMUSCULAR | Status: AC | PRN

## 2021-08-12 MED ORDER — HYDROCODONE-ACETAMINOPHEN 5-325 MG PO TABS: 2.0000 | ORAL_TABLET | Freq: Four times a day (QID) | ORAL | 0 refills | Status: DC | PRN

## 2021-08-12 MED ORDER — HEPARIN SODIUM (PORCINE) 1000 UNIT/ML IJ SOLN
INTRAMUSCULAR | Status: AC
  Filled 2021-08-12: qty 10

## 2021-08-12 MED ORDER — ONDANSETRON HCL 4 MG/2ML IJ SOLN
INTRAMUSCULAR | Status: DC | PRN
  Administered 2021-08-12: 4 mg via INTRAVENOUS

## 2021-08-12 MED ORDER — MIDAZOLAM HCL 2 MG/2ML IJ SOLN
INTRAMUSCULAR | Status: AC
  Filled 2021-08-12: qty 4

## 2021-08-12 MED ORDER — CLINDAMYCIN PHOSPHATE 300 MG/50ML IV SOLN
INTRAVENOUS | Status: AC
  Administered 2021-08-12: 300 mg via INTRAVENOUS
  Filled 2021-08-12: qty 50

## 2021-08-12 MED ORDER — METHYLPREDNISOLONE SODIUM SUCC 125 MG IJ SOLR: 125.0000 mg | Freq: Once | INTRAMUSCULAR | Status: DC | PRN

## 2021-08-12 MED ORDER — ONDANSETRON HCL 4 MG/2ML IJ SOLN
INTRAMUSCULAR | Status: AC
  Filled 2021-08-12: qty 2

## 2021-08-12 MED ORDER — IODIXANOL 320 MG/ML IV SOLN
INTRAVENOUS | Status: DC | PRN
  Administered 2021-08-12: 55 mL via INTRA_ARTERIAL

## 2021-08-12 MED ORDER — SODIUM CHLORIDE 0.9 % IV SOLN: INTRAVENOUS | Status: DC

## 2021-08-12 SURGICAL SUPPLY — 10 items
CANNULA 5F STIFF (CANNULA) ×1 IMPLANT
CATH ANGIO 5F PIGTAIL 65CM (CATHETERS) ×1 IMPLANT
COVER PROBE U/S 5X48 (MISCELLANEOUS) ×1 IMPLANT
DEVICE STARCLOSE SE CLOSURE (Vascular Products) ×2 IMPLANT
GLIDEWIRE ADV .035X180CM (WIRE) ×1 IMPLANT
PACK ANGIOGRAPHY (CUSTOM PROCEDURE TRAY) ×2 IMPLANT
SHEATH BRITE TIP 5FRX11 (SHEATH) ×2 IMPLANT
SYR MEDRAD MARK 7 150ML (SYRINGE) ×1 IMPLANT
TUBING CONTRAST HIGH PRESS 72 (TUBING) ×1 IMPLANT
WIRE GUIDERIGHT .035X150 (WIRE) ×1 IMPLANT

## 2021-08-12 NOTE — Op Note (Signed)
[facility]  Percutaneous Study/Intervention Procedural Note   Date of Surgery: 08/12/2021  Surgeon(s): [name], MD  Assistants:James Feliberto Gottron, MD  Pre-operative Diagnosis: PAD with rest pain bilateral lower extremities  Post-operative diagnosis:  Same  Procedure(s) Performed:             1.  Ultrasound guidance for vascular access bilateral femoral arteries             2.  Catheter placement into aorta from right femoral approach             3.  Aortogram and selective bilateral lower extremity angiograms             4.  StarClose closure device bilateral femoral arteries  EBL: 10 cc  Contrast: 55 cc  Fluoro Time: 2.1 minutes  Moderate Conscious Sedation Time: approximately 42 minutes using 3 mg of Versed and 75 mcg of Fentanyl              Indications:  Patient is a 71 y.o.female with rest pain symptoms worse on the right than the left. The patient has noninvasive study showing fairly severe disease bilaterally. The patient is brought in for angiography for further evaluation and potential treatment.  Due to the limb threatening nature of the situation, angiogram was performed for attempted limb salvage. The patient is aware that if the procedure fails, amputation would be expected.  The patient also understands that even with successful revascularization, amputation may still be required due to the severity of the situation.  Risks and benefits are discussed and informed consent is obtained.   Procedure:  The patient was identified and appropriate procedural time out was performed.  The patient was then placed supine on the table and prepped and draped in the usual sterile fashion. Moderate conscious sedation was administered during a face to face encounter with the patient throughout the procedure with my supervision of the RN administering medicines and monitoring the patient's vital signs, pulse oximetry, telemetry and mental status throughout from the  start of the procedure until the patient was taken to the recovery room. Ultrasound was used to evaluate the left common femoral artery.  It was heavily diseased.  A digital ultrasound image was acquired.  A Seldinger needle was used to access the left common femoral artery under direct ultrasound guidance and a permanent image was performed.  A 0.035 J wire was advanced and a 5Fr sheath was placed.  Imaging through the 5 French sheath showed occlusion of the left iliac system down to an occluded left common femoral artery.  I elected to go ahead and image the left lower extremity through the sheath.  This demonstrated occlusion of the left common femoral artery with reconstitution of both the profunda femoris artery and the SFA.  The SFA was then continuous and did not have any focal stenosis down through the popliteal artery.  There was a typical tibial trifurcation with the posterior tibial artery being the dominant runoff to the foot.  We then elected to access the right femoral artery to evaluate the right leg as well as the aorta and iliac system.  Ultrasound was used to access the right femoral artery under direct ultrasound guidance without difficulty with a Seldinger needle and a 5 French sheath was placed.  A permanent image was recorded.  A J-wire and pigtail catheter was placed into the aorta and an AP aortogram was performed.  This was followed by pelvic obliques after pulling the  pigtail catheter down to the distal aorta.  This demonstrated normal renal arteries and fairly normal aorta down to the terminal segment.  The left common iliac artery was occluded at its origin and was occluded throughout the common and external iliac arteries.  The right common iliac artery had about a 50% stenosis in the proximal segment but then normalized in the right external iliac artery had about an 80% stenosis in the mid to distal segment.  The right common femoral artery was also small and diseased.  It was fairly  clear based off this that the patient will need femoral endarterectomies and a hybrid procedure to address her iliac disease in either the form of bilateral iliac stent placements or a right iliac stent placement with femoral to femoral bypass.  I then went ahead and image the right lower extremity to evaluate if infrainguinal disease would need to be addressed as well at that time.  This was done through the right femoral sheath.  Selective right lower extremity angiogram was then performed. This demonstrated small diseased right common femoral artery with relatively normal profunda femoris artery.  The SFA had a flush occlusion and reconstituted in the distal SFA although in a separate distinct lesion in the proximal to mid above-knee popliteal artery there was a high-grade stenosis.  The distal runoff was sluggish, but there appeared to be two-vessel runoff distally.  This could likely be addressed at the same time as her aortoiliac procedures and femoral endarterectomies. I elected to terminate the procedure. The sheath was removed and StarClose closure device was deployed in the right femoral artery with excellent hemostatic result.  Similarly, StarClose closure device was deployed in the left femoral artery with excellent hemostatic result.  The patient was taken to the recovery room in stable condition having tolerated the procedure well.  Findings:               Aortogram:  This demonstrated normal renal arteries and fairly normal aorta down to the terminal segment.  The left common iliac artery was occluded at its origin and was occluded throughout the common and external iliac arteries.  The right common iliac artery had about a 50% stenosis in the proximal segment but then normalized in the right external iliac artery had about an 80% stenosis in the mid to distal segment.  The right common femoral artery was also small and diseased.             Left lower Extremity: Occlusion of the left common  femoral artery with reconstitution of both the profunda femoris artery and the SFA.  The SFA was then continuous and did not have any focal stenosis down through the popliteal artery.  There was a typical tibial trifurcation with the posterior tibial artery being the dominant runoff to the foot.  Right lower extremity: Small diseased right common femoral artery with relatively normal profunda femoris artery.  The SFA had a flush occlusion and reconstituted in the distal SFA although in a separate distinct lesion in the proximal to mid above-knee popliteal artery there was a high-grade stenosis.  The distal runoff was sluggish, but there appeared to be two-vessel runoff distally.   Disposition: Patient was taken to the recovery room in stable condition having tolerated the procedure well.  Complications: None  [name] 08/12/2021 10:48 AM   This note was created with Dragon Medical transcription system. Any errors in dictation are purely unintentional.

## 2021-08-12 NOTE — Interval H&P Note (Signed)
History and Physical Interval Note:  08/12/2021 8:15 AM  Wendy Arias  has presented today for surgery, with the diagnosis of RLE Angio   BARD   ASO w rest pain.  The various methods of treatment have been discussed with the patient and family. After consideration of risks, benefits and other options for treatment, the patient has consented to  Procedure(s): Lower Extremity Angiography (Right) as a surgical intervention.  The patient's history has been reviewed, patient examined, no change in status, stable for surgery.  I have reviewed the patient's chart and labs.  Questions were answered to the patient's satisfaction.     [name]

## 2021-08-17 ENCOUNTER — Ambulatory Visit: Admission: RE | Admit: 2021-08-17 | Payer: PPO | Source: Home / Self Care | Admitting: Vascular Surgery

## 2021-08-17 ENCOUNTER — Encounter: Admission: RE | Payer: Self-pay | Source: Home / Self Care

## 2021-08-17 DIAGNOSIS — I70229 Atherosclerosis of native arteries of extremities with rest pain, unspecified extremity: Secondary | ICD-10-CM

## 2021-08-17 SURGERY — LOWER EXTREMITY ANGIOGRAPHY
Anesthesia: Moderate Sedation | Site: Leg Lower | Laterality: Left

## 2021-08-24 ENCOUNTER — Other Ambulatory Visit (INDEPENDENT_AMBULATORY_CARE_PROVIDER_SITE_OTHER): Payer: Self-pay | Admitting: Nurse Practitioner

## 2021-08-24 ENCOUNTER — Telehealth (INDEPENDENT_AMBULATORY_CARE_PROVIDER_SITE_OTHER): Payer: Self-pay

## 2021-08-24 MED ORDER — HYDROCODONE-ACETAMINOPHEN 5-325 MG PO TABS: 2.0000 | ORAL_TABLET | Freq: Four times a day (QID) | ORAL | 0 refills | Status: DC | PRN

## 2021-08-24 NOTE — Telephone Encounter (Signed)
Patient has been made aware prescription was sent

## 2021-08-24 NOTE — Telephone Encounter (Signed)
sent 

## 2021-08-25 ENCOUNTER — Telehealth (INDEPENDENT_AMBULATORY_CARE_PROVIDER_SITE_OTHER): Payer: Self-pay

## 2021-08-25 NOTE — Telephone Encounter (Signed)
Spoke with the patient and she will be scheduled for her surgery on 09/16/21 at the [facility]. Phone call pre-op is on 09/08/21 between 8-1 pm and covid testing on 09/14/21 between 8-12 pm at the [facility]. Pre-surgical instructions were discussed and will be mailed.

## 2021-08-25 NOTE — Telephone Encounter (Signed)
I attempted to contact the patient to schedule her for a bilateral femoral endartectomy, bilateral iliac stents and right SFA stent with possible fem-fem bypass with Dr. Lucky Cowboy. A message was left for a return call.

## 2021-08-31 ENCOUNTER — Telehealth (INDEPENDENT_AMBULATORY_CARE_PROVIDER_SITE_OTHER): Payer: Self-pay

## 2021-09-01 ENCOUNTER — Other Ambulatory Visit (INDEPENDENT_AMBULATORY_CARE_PROVIDER_SITE_OTHER): Payer: Self-pay | Admitting: Nurse Practitioner

## 2021-09-01 MED ORDER — HYDROCODONE-ACETAMINOPHEN 5-325 MG PO TABS: 2.0000 | ORAL_TABLET | Freq: Four times a day (QID) | ORAL | 0 refills | Status: DC | PRN

## 2021-09-01 NOTE — Telephone Encounter (Signed)
Sent in refill.  This should last until her surgery.

## 2021-09-01 NOTE — Telephone Encounter (Signed)
Patient has been made aware.

## 2021-09-08 ENCOUNTER — Other Ambulatory Visit (INDEPENDENT_AMBULATORY_CARE_PROVIDER_SITE_OTHER): Payer: Self-pay | Admitting: Nurse Practitioner

## 2021-09-08 ENCOUNTER — Other Ambulatory Visit: Payer: Self-pay

## 2021-09-08 ENCOUNTER — Encounter
Admission: RE | Admit: 2021-09-08 | Discharge: 2021-09-08 | Disposition: A | Discharge status: Home / Self Care | Payer: PPO | Source: Ambulatory Visit | Attending: Vascular Surgery | Admitting: Vascular Surgery

## 2021-09-08 DIAGNOSIS — I70223 Atherosclerosis of native arteries of extremities with rest pain, bilateral legs: Secondary | ICD-10-CM

## 2021-09-08 HISTORY — DX: Peripheral vascular disease, unspecified: I73.9

## 2021-09-08 HISTORY — DX: Nausea with vomiting, unspecified: R11.2

## 2021-09-08 HISTORY — DX: Other specified postprocedural states: Z98.890

## 2021-09-08 NOTE — Patient Instructions (Signed)
Your procedure is scheduled on: 09/16/21 Report to [facility]. To find out your arrival time please call [phone] between 1PM - 3PM on 09/15/21.  Remember: Instructions that are not followed completely may result in serious medical risk, up to and including death, or upon the discretion of your surgeon and anesthesiologist your surgery may need to be rescheduled.     _X__ 1. Do not eat food or drink any liquids after midnight the night before your procedure.                 No gum chewing or hard candies.   __X__2.  On the morning of surgery brush your teeth with toothpaste and water, you                 may rinse your mouth with mouthwash if you wish.  Do not swallow any              toothpaste of mouthwash.     _X__ 3.  No Alcohol for 24 hours before or after surgery.   _X__ 4.  Do Not Smoke or use e-cigarettes For 24 Hours Prior to Your Surgery.                 Do not use any chewable tobacco products for at least 6 hours prior to                 surgery.  ____  5.  Bring all medications with you on the day of surgery if instructed.   __X__  6.  Notify your doctor if there is any change in your medical condition      (cold, fever, infections).     Do not wear jewelry, make-up, hairpins, clips or nail polish or toenail polish. Do not wear lotions, powders, or perfumes.  Do not shave body hair 48 hours prior to surgery. Men may shave face and neck. Do not bring valuables to the hospital.    [facility] is not responsible for any belongings or valuables.  Contacts, dentures/partials or body piercings may not be worn into surgery. Bring a case for your contacts, glasses or hearing aids, a denture cup will be supplied. Leave your suitcase in the car. After surgery it may be brought to your room. For patients admitted to the hospital, discharge time is determined by your treatment team.   Patients discharged the day of surgery  will not be allowed to drive home.   Please read over the following fact sheets that you were given:   CHG soap  __X__ Take these medicines the morning of surgery with A SIP OF WATER:    1. atorvastatin (LIPITOR) 40 MG tablet  2. metoprolol succinate (TOPROL-XL) 25 MG 24 hr tablet  3. HYDROcodone-acetaminophen (NORCO/VICODIN) 5-325 MG tablet if needed  4.  5.  6.  ____ Fleet Enema (as directed)   __X__ Use CHG Soap/SAGE wipes as directed  __X__ Use inhalers on the day of surgery  ____ Stop metformin/Janumet/Farxiga 2 days prior to surgery    ____ Take 1/2 of usual insulin dose the night before surgery. No insulin the morning          of surgery.   ____ Stop Blood Thinners Coumadin/Plavix/Xarelto/Pleta/Pradaxa/Eliquis/Effient/Aspirin  on   Or contact your Surgeon, Cardiologist or Medical Doctor regarding  ability to stop your blood thinners  __X__ Stop Anti-inflammatories 7 days before surgery such as Advil, Ibuprofen, Motrin,  BC or Goodies Powder, Naprosyn, Naproxen, Aleve   __X__ Stop all herbals and supplements, fish oil or vitamins  until after surgery.  STOP VITAMIN B12 AND VITAMIN E TODAY 09/08/21  ____ Bring C-Pap to the hospital.   YOU MAY CONTINUE YOUR ASPIRIN DAILY BUT DO NOT TAKE THE MORNING OF YOUR SURGERY.

## 2021-09-10 ENCOUNTER — Encounter: Payer: Self-pay | Admitting: Vascular Surgery

## 2021-09-10 NOTE — Progress Notes (Signed)
?Perioperative Services ? ?Pre-Admission/Anesthesia Testing Clinical Review ? ?Date: 09/15/21 ? ?Patient Demographics:  ?Name: Wendy Arias ?DOB:   [birth year] ?MRN:   [id number] ? ?Planned Surgical Procedure(s):  ? ? Case: [id number] Date/Time: 09/16/21 0942  ? Procedures:  ?    ENDARTERECTOMY FEMORAL (RIGHT SFA STENTS); POSSIBLE FEM-FEM BYPASS ?    INSERTION OF ILIAC STENT (Bilateral) ?    APPLICATION OF CELL SAVER  ? Anesthesia type: General  ? Pre-op diagnosis: ASO WITH REST PAIN  ? Location: [facility] OR ROOM 08 / [facility]S FOR ANESTHESIA GROUP  ? Surgeons: Algernon Huxley, MD  ? ?NOTE: Available PAT nursing documentation and vital signs have been reviewed. Clinical nursing staff has updated patient's PMH/PSHx, current medication list, and drug allergies/intolerances to ensure comprehensive history available to assist in medical decision making as it pertains to the aforementioned surgical procedure and anticipated anesthetic course. Extensive review of available clinical information performed. [place] PMH and PSHx updated with any diagnoses/procedures that  may have been inadvertently omitted during her intake with the pre-admission testing department's nursing staff. ? ?Clinical Discussion:  ?Wendy Arias is a 71 y.o. female who is submitted for pre-surgical anesthesia review and clearance prior to her undergoing the above procedure. Patient is a Current Smoker (10 pack years). Pertinent PMH includes: CAD, NSTEMI, ischemic cardiomyopathy, HFpEF, aortic atherosclerosis, PVD, HTN, HLD, DOE, COPD, asthma, IDA, anxiety, depression. ? ?Patient is followed by cardiology Saralyn Pilar, MD). She was last seen in the cardiology clinic on 09/15/2021; notes reviewed.  At the time of her clinic visit, patient doing well overall from a cardiovascular perspective.  She denied any episodes of chest pain, however continued to experience chronic exertional dyspnea related to her underlying COPD diagnosis and ongoing smoking history.   Additionally, patient feels as if she contracted SARS-CoV-2 in 12/2018, however her testing was negative.  Since that time, patient has been experiencing worsening exertional dyspnea. Patient denied any PND, orthopnea, palpitations, significant peripheral edema, vertiginous symptoms, or presyncope/syncope. Patient with a past medical history significant for cardiovascular diagnoses. ? ?Patient suffered an NSTEMI on 06/05/2004.   ? ?TTE performed on 06/07/2004 revealed a normal left ventricular systolic function with EF 50-55%.  There was inferior hypokinesis noted. There is no evidence of significant valvular regurgitation or transvalvular gradient suggestive of stenosis.    ? ?Diagnostic left heart catheterization was performed on 06/08/2004 revealing a normal left ventricular systolic function with an EF of 70%.  There was multivessel CAD; 50% mid LAD, 50% distal LAD, 99% OM3, 50% proximal RCA, and 50% mid RCA.  PCI was performed placing a DES x1 (unknown type) to the OM3 lesion restoring TIMI-3 flow. ? ?Last TTE was performed on 12/18/2020 revealing a normal left ventricular systolic function with an EF of >55%.  There was mild left atrial enlargement.  There was trivial tricuspid/pulmonary and moderate mitral valve regurgitation.  Diastolic Doppler parameters consistent with impaired relaxation (G1DD).  There was no evidence of valvular stenosis. ? ?Patient with known PVD. Recent ABI/TBI study  on 08/04/2021 revealed severe RIGHT and moderate LEFT lower extremity arterial disease.  Subsequent lower extremity angiography study on 08/12/2021 revealed 50% stenosis in the RIGHT proximal common iliac artery and 80% stenosis in the mid to distal segment of the RIGHT external iliac artery.  There was occlusion of the LEFT common femoral artery with reconstitution of both the PFA and SFA.  There was a small diseased RIGHT common femoral artery with a relatively normal PFA.  The  RIGHT SFA had a flush occlusion and  reconstituted in the distal SFA with a separate distinct high-grade stenotic lesion in the proximal to mid above-the-knee popliteal artery. ? ?Myocardial perfusion imaging study performed on 09/14/2021 revealed a normal left ventricular systolic function with an EF of 64%.  There were no regional wall motion abnormalities.  There was no evidence of stress-induced myocardial ischemia or arrhythmia.  Study determined to be normal and low risk. ? ?Blood pressure well controlled on at 102/68 currently prescribed beta-blocker monotherapy.  Patient is on a statin for her HLD and further ASCVD prevention.  She is not diabetic.  Patient remains active, however has no formal daily exercise regimen.  Functional capacity somewhat limited by underlying COPD diagnosis causing exertional dyspnea, however patient still felt to be able to achieve at least 4 METS of activity without angina/anginal equivalent symptoms.  No changes were made to her medication regimen.  Patient to follow-up with outpatient cardiology in 4 months or sooner if needed. ? ?Wendy Arias is scheduled for an ENDARTERECTOMY FEMORAL (RIGHT SFA STENTS); POSSIBLE FEMOROFEMORAL BYPASS; INSERTION OF BILATERAL ILIAC STENTS on 09/16/2021 with Dr. [name], MD. Given patient's past medical history significant for cardiovascular diagnoses, presurgical cardiac clearance was sought by the PAT team. Per cardiology, "this patient is optimized for surgery and may proceed with the planned procedural course with a LOW risk of significant perioperative cardiovascular complications".  In review of her medication reconciliation, it is noted the patient is on daily antiplatelet therapy.  Should it be required, cardiology has cleared patient to hold her daily low-dose ASA for 5 days prior to procedure with plans to restart as soon as postoperatively respectively minimized by her primary attending surgeon.  Per Dr. Bunnie Domino standing orders for this procedure, patient will continue  her daily ASA dose throughout the perioperative course, holding her dose only on the morning of her procedure. ? ?Patient reports previous perioperative complications with anesthesia in the past. Patient has a PMH (+) for PONV.  Order placed by PAT for patient to receive a prophylactic preoperative dose of aprepitant. In review of the available records, it is noted that patient underwent a general + regional anesthetic course at [facility] (ASA IV) in 03/2018 without documented complications.  ? ?Vitals with BMI 09/08/2021 08/12/2021 08/12/2021  ?Height 5' 1.5" - -  ?Weight 130 lbs - -  ?BMI 24.17 - -  ?Systolic - 98 -  ?Diastolic - 60 -  ?Pulse - - 75  ?Some encounter information is confidential and restricted. Go to Review Flowsheets activity to see all data.  ? ? ?Providers/Specialists:  ? ?NOTE: Primary physician provider listed below. Patient may have been seen by APP or partner within same practice.  ? ?PROVIDER ROLE / SPECIALTY LAST OV  ?Algernon Huxley, MD Vascular Surgery (Surgeon) 08/12/2021  ?Donnamarie Rossetti, PA-C Primary Care Provider 07/30/2021  ?Isaias Cowman, MD Cardiology 09/15/2021  ? ?Allergies:  ?Penicillins ? ?Current Home Medications:  ? ?No current facility-administered medications for this encounter.  ? ? albuterol (PROVENTIL HFA;VENTOLIN HFA) 108 (90 Base) MCG/ACT inhaler  ? aspirin EC 81 MG tablet  ? atorvastatin (LIPITOR) 40 MG tablet  ? HYDROcodone-acetaminophen (NORCO/VICODIN) 5-325 MG tablet  ? Magnesium 400 MG CAPS  ? metoprolol succinate (TOPROL-XL) 25 MG 24 hr tablet  ? TRELEGY ELLIPTA 100-62.5-25 MCG/ACT AEPB  ? triamcinolone cream (KENALOG) 0.1 %  ? triamcinolone cream (KENALOG) 0.1 %  ? vitamin B-12 (CYANOCOBALAMIN) 1000 MCG tablet  ?  Vitamin E 268 MG (400 UNIT) CAPS  ? ?History:  ? ?Past Medical History:  ?Diagnosis Date  ? (HFpEF) heart failure with preserved ejection fraction ([place]) 06/07/2004  ? a.) TTE 06/07/2004: EF 50-55%; inf HK. b.) TTE  04/17/2018: EF 45%; GLS -14.6%; inferoseptal and inferoposterior HK; sev LA enlargement; mild PR, mod TR, sev MR; G2DD. c.) TTE 12/05/2018: EF >55%, mild LVH; mild MR. d.) TTE 12/18/2020: EF >55%; mild LA enlargement; triv

## 2021-09-14 ENCOUNTER — Other Ambulatory Visit: Payer: PPO

## 2021-09-14 ENCOUNTER — Other Ambulatory Visit: Payer: Self-pay

## 2021-09-14 ENCOUNTER — Other Ambulatory Visit
Admission: RE | Admit: 2021-09-14 | Discharge: 2021-09-14 | Disposition: A | Discharge status: Home / Self Care | Payer: PPO | Source: Ambulatory Visit | Attending: Vascular Surgery | Admitting: Vascular Surgery

## 2021-09-14 DIAGNOSIS — I70223 Atherosclerosis of native arteries of extremities with rest pain, bilateral legs: Secondary | ICD-10-CM | POA: Insufficient documentation

## 2021-09-14 DIAGNOSIS — Z01818 Encounter for other preprocedural examination: Secondary | ICD-10-CM | POA: Insufficient documentation

## 2021-09-14 LAB — CBC WITH DIFFERENTIAL/PLATELET
Abs Immature Granulocytes: 0.02 10*3/uL (ref 0.00–0.07)
Basophils Absolute: 0 10*3/uL (ref 0.0–0.1)
Basophils Relative: 1 %
Eosinophils Absolute: 0.1 10*3/uL (ref 0.0–0.5)
Eosinophils Relative: 2 %
HCT: 44.6 % (ref 36.0–46.0)
Hemoglobin: 14 g/dL (ref 12.0–15.0)
Immature Granulocytes: 0 %
Lymphocytes Relative: 24 %
Lymphs Abs: 1.3 10*3/uL (ref 0.7–4.0)
MCH: 29 pg (ref 26.0–34.0)
MCHC: 31.4 g/dL (ref 30.0–36.0)
MCV: 92.3 fL (ref 80.0–100.0)
Monocytes Absolute: 0.3 10*3/uL (ref 0.1–1.0)
Monocytes Relative: 6 %
Neutro Abs: 3.6 10*3/uL (ref 1.7–7.7)
Neutrophils Relative %: 67 %
Platelets: 215 10*3/uL (ref 150–400)
RBC: 4.83 MIL/uL (ref 3.87–5.11)
RDW: 13.8 % (ref 11.5–15.5)
WBC: 5.4 10*3/uL (ref 4.0–10.5)
nRBC: 0 % (ref 0.0–0.2)

## 2021-09-14 LAB — BASIC METABOLIC PANEL
Anion gap: 10 (ref 5–15)
BUN: 14 mg/dL (ref 8–23)
CO2: 25 mmol/L (ref 22–32)
Calcium: 9.1 mg/dL (ref 8.9–10.3)
Chloride: 105 mmol/L (ref 98–111)
Creatinine, Ser: 0.74 mg/dL (ref 0.44–1.00)
GFR, Estimated: >60 mL/min (ref >60)
Glucose, Bld: 82 mg/dL (ref 70–99)
Potassium: 4.1 mmol/L (ref 3.5–5.1)
Sodium: 140 mmol/L (ref 135–145)

## 2021-09-15 ENCOUNTER — Encounter: Payer: Self-pay | Admitting: Vascular Surgery

## 2021-09-16 ENCOUNTER — Inpatient Hospital Stay: Payer: PPO | Admitting: Urgent Care

## 2021-09-16 ENCOUNTER — Other Ambulatory Visit: Payer: Self-pay

## 2021-09-16 ENCOUNTER — Encounter
Admission: RE | Disposition: A | Discharge status: Home / Self Care | Payer: Self-pay | Source: Home / Self Care | Attending: Vascular Surgery

## 2021-09-16 ENCOUNTER — Inpatient Hospital Stay: Payer: PPO

## 2021-09-16 ENCOUNTER — Encounter: Payer: Self-pay | Admitting: Vascular Surgery

## 2021-09-16 ENCOUNTER — Inpatient Hospital Stay
Admission: RE | Admit: 2021-09-16 | Discharge: 2021-09-18 | DRG: 253 | Disposition: A | Discharge status: Home / Self Care | Payer: PPO | Attending: Vascular Surgery | Admitting: Vascular Surgery

## 2021-09-16 DIAGNOSIS — E7800 Pure hypercholesterolemia, unspecified: Secondary | ICD-10-CM | POA: Diagnosis present

## 2021-09-16 DIAGNOSIS — I70221 Atherosclerosis of native arteries of extremities with rest pain, right leg: Secondary | ICD-10-CM

## 2021-09-16 DIAGNOSIS — Z880 Allergy status to penicillin: Secondary | ICD-10-CM

## 2021-09-16 DIAGNOSIS — Z8249 Family history of ischemic heart disease and other diseases of the circulatory system: Secondary | ICD-10-CM

## 2021-09-16 DIAGNOSIS — I110 Hypertensive heart disease with heart failure: Secondary | ICD-10-CM | POA: Diagnosis present

## 2021-09-16 DIAGNOSIS — I252 Old myocardial infarction: Secondary | ICD-10-CM

## 2021-09-16 DIAGNOSIS — I2510 Atherosclerotic heart disease of native coronary artery without angina pectoris: Secondary | ICD-10-CM | POA: Diagnosis present

## 2021-09-16 DIAGNOSIS — F32A Depression, unspecified: Secondary | ICD-10-CM | POA: Diagnosis present

## 2021-09-16 DIAGNOSIS — I70229 Atherosclerosis of native arteries of extremities with rest pain, unspecified extremity: Principal | ICD-10-CM

## 2021-09-16 DIAGNOSIS — F17210 Nicotine dependence, cigarettes, uncomplicated: Secondary | ICD-10-CM | POA: Diagnosis present

## 2021-09-16 DIAGNOSIS — M810 Age-related osteoporosis without current pathological fracture: Secondary | ICD-10-CM | POA: Diagnosis present

## 2021-09-16 DIAGNOSIS — I70223 Atherosclerosis of native arteries of extremities with rest pain, bilateral legs: Secondary | ICD-10-CM | POA: Diagnosis present

## 2021-09-16 DIAGNOSIS — J449 Chronic obstructive pulmonary disease, unspecified: Secondary | ICD-10-CM | POA: Diagnosis present

## 2021-09-16 DIAGNOSIS — I255 Ischemic cardiomyopathy: Secondary | ICD-10-CM | POA: Diagnosis present

## 2021-09-16 DIAGNOSIS — I70222 Atherosclerosis of native arteries of extremities with rest pain, left leg: Secondary | ICD-10-CM

## 2021-09-16 DIAGNOSIS — D509 Iron deficiency anemia, unspecified: Secondary | ICD-10-CM | POA: Diagnosis present

## 2021-09-16 DIAGNOSIS — I959 Hypotension, unspecified: Secondary | ICD-10-CM | POA: Diagnosis not present

## 2021-09-16 DIAGNOSIS — I5032 Chronic diastolic (congestive) heart failure: Secondary | ICD-10-CM | POA: Diagnosis present

## 2021-09-16 DIAGNOSIS — F419 Anxiety disorder, unspecified: Secondary | ICD-10-CM | POA: Diagnosis present

## 2021-09-16 HISTORY — PX: ENDARTERECTOMY FEMORAL: SHX5804

## 2021-09-16 HISTORY — DX: Ischemic cardiomyopathy: I25.5

## 2021-09-16 HISTORY — DX: Hyperlipidemia, unspecified: E78.5

## 2021-09-16 HISTORY — DX: Iron deficiency anemia, unspecified: D50.9

## 2021-09-16 HISTORY — DX: Other forms of dyspnea: R06.09

## 2021-09-16 HISTORY — DX: Atherosclerosis of aorta: I70.0

## 2021-09-16 HISTORY — DX: Diverticulosis of intestine, part unspecified, without perforation or abscess without bleeding: K57.90

## 2021-09-16 HISTORY — PX: INSERTION OF ILIAC STENT: SHX6256

## 2021-09-16 LAB — CBC
HCT: 26.6 % — ABNORMAL LOW (ref 36.0–46.0)
Hemoglobin: 8.1 g/dL — ABNORMAL LOW (ref 12.0–15.0)
MCH: 29 pg (ref 26.0–34.0)
MCHC: 30.5 g/dL (ref 30.0–36.0)
MCV: 95.3 fL (ref 80.0–100.0)
Platelets: 177 10*3/uL (ref 150–400)
RBC: 2.79 MIL/uL — ABNORMAL LOW (ref 3.87–5.11)
RDW: 13.9 % (ref 11.5–15.5)
WBC: 6 10*3/uL (ref 4.0–10.5)
nRBC: 0 % (ref 0.0–0.2)

## 2021-09-16 LAB — ABO/RH: ABO/RH(D): A NEG

## 2021-09-16 LAB — BASIC METABOLIC PANEL
Anion gap: 8 (ref 5–15)
BUN: 16 mg/dL (ref 8–23)
CO2: 19 mmol/L — ABNORMAL LOW (ref 22–32)
Calcium: 7.6 mg/dL — ABNORMAL LOW (ref 8.9–10.3)
Chloride: 107 mmol/L (ref 98–111)
Creatinine, Ser: 0.83 mg/dL (ref 0.44–1.00)
GFR, Estimated: >60 mL/min (ref >60)
Glucose, Bld: 207 mg/dL — ABNORMAL HIGH (ref 70–99)
Potassium: 4.2 mmol/L (ref 3.5–5.1)
Sodium: 134 mmol/L — ABNORMAL LOW (ref 135–145)

## 2021-09-16 LAB — MRSA NEXT GEN BY PCR, NASAL: MRSA by PCR Next Gen: NOT DETECTED

## 2021-09-16 LAB — GLUCOSE, CAPILLARY: Glucose-Capillary: 145 mg/dL — ABNORMAL HIGH (ref 70–99)

## 2021-09-16 LAB — PREPARE RBC (CROSSMATCH)

## 2021-09-16 SURGERY — ENDARTERECTOMY, FEMORAL
Anesthesia: General

## 2021-09-16 MED ORDER — APREPITANT 40 MG PO CAPS
40.0000 mg | ORAL_CAPSULE | Freq: Once | ORAL | Status: AC
Start: 2021-09-16 — End: 2021-09-16

## 2021-09-16 MED ORDER — MORPHINE SULFATE (PF) 2 MG/ML IV SOLN
2.0000 mg | INTRAVENOUS | Status: DC | PRN
  Administered 2021-09-16 (×2): 4 mg via INTRAVENOUS
  Administered 2021-09-16 (×2): 2 mg via INTRAVENOUS
  Administered 2021-09-16: 4 mg via INTRAVENOUS
  Administered 2021-09-16: 2 mg via INTRAVENOUS
  Administered 2021-09-17: 5 mg via INTRAVENOUS
  Administered 2021-09-17: 2 mg via INTRAVENOUS
  Administered 2021-09-17: 5 mg via INTRAVENOUS
  Administered 2021-09-17 (×3): 4 mg via INTRAVENOUS
  Administered 2021-09-17 (×2): 5 mg via INTRAVENOUS
  Administered 2021-09-18: 4 mg via INTRAVENOUS
  Administered 2021-09-18: 5 mg via INTRAVENOUS
  Filled 2021-09-16: qty 2
  Filled 2021-09-16 (×3): qty 3
  Filled 2021-09-16: qty 1
  Filled 2021-09-16 (×3): qty 2
  Filled 2021-09-16 (×2): qty 3
  Filled 2021-09-16: qty 1
  Filled 2021-09-16: qty 3
  Filled 2021-09-16 (×3): qty 2
  Filled 2021-09-16: qty 1

## 2021-09-16 MED ORDER — PROPOFOL 500 MG/50ML IV EMUL
INTRAVENOUS | Status: AC
  Filled 2021-09-16: qty 50

## 2021-09-16 MED ORDER — UMECLIDINIUM BROMIDE 62.5 MCG/ACT IN AEPB
1.0000 | INHALATION_SPRAY | Freq: Every day | RESPIRATORY_TRACT | Status: DC
Start: 2021-09-17 — End: 2021-09-18
  Administered 2021-09-17: 1 via RESPIRATORY_TRACT
  Filled 2021-09-16: qty 7

## 2021-09-16 MED ORDER — PROPOFOL 10 MG/ML IV BOLUS
INTRAVENOUS | Status: DC | PRN
  Administered 2021-09-16: 120 mg via INTRAVENOUS

## 2021-09-16 MED ORDER — FENTANYL CITRATE (PF) 100 MCG/2ML IJ SOLN
INTRAMUSCULAR | Status: AC
  Filled 2021-09-16: qty 2

## 2021-09-16 MED ORDER — VISTASEAL 10 ML SINGLE DOSE KIT
PACK | CUTANEOUS | Status: AC
  Filled 2021-09-16: qty 10

## 2021-09-16 MED ORDER — ATORVASTATIN CALCIUM 20 MG PO TABS
40.0000 mg | ORAL_TABLET | Freq: Every day | ORAL | Status: DC
  Administered 2021-09-17 – 2021-09-18 (×2): 40 mg via ORAL
  Filled 2021-09-16 (×2): qty 2

## 2021-09-16 MED ORDER — EPHEDRINE SULFATE (PRESSORS) 50 MG/ML IJ SOLN
INTRAMUSCULAR | Status: DC | PRN
Start: 2021-09-16 — End: 2021-09-16
  Administered 2021-09-16: 5 mg via INTRAVENOUS

## 2021-09-16 MED ORDER — MIDAZOLAM HCL 2 MG/2ML IJ SOLN
INTRAMUSCULAR | Status: AC
  Filled 2021-09-16: qty 2

## 2021-09-16 MED ORDER — MAGNESIUM SULFATE 2 GM/50ML IV SOLN
2.0000 g | Freq: Every day | INTRAVENOUS | Status: DC | PRN
  Filled 2021-09-16: qty 50

## 2021-09-16 MED ORDER — HYDRALAZINE HCL 20 MG/ML IJ SOLN: 5.0000 mg | INTRAMUSCULAR | Status: DC | PRN

## 2021-09-16 MED ORDER — EPHEDRINE 5 MG/ML INJ
INTRAVENOUS | Status: AC
  Filled 2021-09-16: qty 5

## 2021-09-16 MED ORDER — PHENYLEPHRINE HCL-NACL 20-0.9 MG/250ML-% IV SOLN
INTRAVENOUS | Status: AC
  Filled 2021-09-16: qty 250

## 2021-09-16 MED ORDER — SODIUM CHLORIDE 0.9 % IV SOLN: 10.0000 mL/h | Freq: Once | INTRAVENOUS | Status: DC

## 2021-09-16 MED ORDER — PHENYLEPHRINE 40 MCG/ML (10ML) SYRINGE FOR IV PUSH (FOR BLOOD PRESSURE SUPPORT)
PREFILLED_SYRINGE | INTRAVENOUS | Status: DC | PRN
  Administered 2021-09-16: 160 ug via INTRAVENOUS
  Administered 2021-09-16: 120 ug via INTRAVENOUS

## 2021-09-16 MED ORDER — METOPROLOL SUCCINATE ER 50 MG PO TB24
25.0000 mg | ORAL_TABLET | Freq: Every day | ORAL | Status: DC
  Administered 2021-09-17 – 2021-09-18 (×2): 25 mg via ORAL
  Filled 2021-09-16 (×2): qty 1

## 2021-09-16 MED ORDER — NITROGLYCERIN IN D5W 200-5 MCG/ML-% IV SOLN: 5.0000 ug/min | INTRAVENOUS | Status: DC

## 2021-09-16 MED ORDER — DOCUSATE SODIUM 100 MG PO CAPS
100.0000 mg | ORAL_CAPSULE | Freq: Every day | ORAL | Status: DC
  Administered 2021-09-18: 100 mg via ORAL
  Filled 2021-09-16 (×2): qty 1

## 2021-09-16 MED ORDER — DOPAMINE-DEXTROSE 3.2-5 MG/ML-% IV SOLN: 3.0000 ug/kg/min | INTRAVENOUS | Status: DC

## 2021-09-16 MED ORDER — HEPARIN SODIUM (PORCINE) 1000 UNIT/ML IJ SOLN
INTRAMUSCULAR | Status: DC | PRN
  Administered 2021-09-16: 6000 [IU] via INTRAVENOUS
  Administered 2021-09-16: 2500 [IU] via INTRAVENOUS

## 2021-09-16 MED ORDER — FENTANYL CITRATE (PF) 100 MCG/2ML IJ SOLN
INTRAMUSCULAR | Status: DC | PRN
  Administered 2021-09-16 (×3): 25 ug via INTRAVENOUS
  Administered 2021-09-16: 50 ug via INTRAVENOUS
  Administered 2021-09-16: 25 ug via INTRAVENOUS
  Administered 2021-09-16: 50 ug via INTRAVENOUS

## 2021-09-16 MED ORDER — ALBUMIN HUMAN 5 % IV SOLN: INTRAVENOUS | Status: DC | PRN

## 2021-09-16 MED ORDER — CALCIUM CHLORIDE 10 % IV SOLN
INTRAVENOUS | Status: DC | PRN
  Administered 2021-09-16 (×2): 500 mg via INTRAVENOUS

## 2021-09-16 MED ORDER — PHENOL 1.4 % MT LIQD
1.0000 | OROMUCOSAL | Status: DC | PRN
  Filled 2021-09-16: qty 177

## 2021-09-16 MED ORDER — OXYCODONE-ACETAMINOPHEN 5-325 MG PO TABS: 1.0000 | ORAL_TABLET | ORAL | Status: DC | PRN

## 2021-09-16 MED ORDER — FENTANYL CITRATE (PF) 100 MCG/2ML IJ SOLN
INTRAMUSCULAR | Status: AC
Start: 2021-09-16 — End: ?
  Filled 2021-09-16: qty 2

## 2021-09-16 MED ORDER — LABETALOL HCL 5 MG/ML IV SOLN: 10.0000 mg | INTRAVENOUS | Status: DC | PRN

## 2021-09-16 MED ORDER — ACETAMINOPHEN 650 MG RE SUPP: 325.0000 mg | RECTAL | Status: DC | PRN

## 2021-09-16 MED ORDER — FAMOTIDINE IN NACL 20-0.9 MG/50ML-% IV SOLN
20.0000 mg | Freq: Two times a day (BID) | INTRAVENOUS | Status: DC
  Administered 2021-09-16: 20 mg via INTRAVENOUS
  Filled 2021-09-16: qty 50

## 2021-09-16 MED ORDER — DEXAMETHASONE SODIUM PHOSPHATE 10 MG/ML IJ SOLN
INTRAMUSCULAR | Status: AC
  Filled 2021-09-16: qty 1

## 2021-09-16 MED ORDER — FLUTICASONE FUROATE-VILANTEROL 100-25 MCG/ACT IN AEPB
1.0000 | INHALATION_SPRAY | Freq: Every day | RESPIRATORY_TRACT | Status: DC
Start: 2021-09-17 — End: 2021-09-18
  Administered 2021-09-17: 1 via RESPIRATORY_TRACT
  Filled 2021-09-16: qty 28

## 2021-09-16 MED ORDER — ALUM & MAG HYDROXIDE-SIMETH 200-200-20 MG/5ML PO SUSP: 15.0000 mL | ORAL | Status: DC | PRN

## 2021-09-16 MED ORDER — METOPROLOL TARTRATE 5 MG/5ML IV SOLN: 2.0000 mg | INTRAVENOUS | Status: DC | PRN

## 2021-09-16 MED ORDER — PROPOFOL 500 MG/50ML IV EMUL
INTRAVENOUS | Status: DC | PRN
  Administered 2021-09-16: 50 ug/kg/min via INTRAVENOUS

## 2021-09-16 MED ORDER — POTASSIUM CHLORIDE CRYS ER 20 MEQ PO TBCR: 20.0000 meq | EXTENDED_RELEASE_TABLET | Freq: Every day | ORAL | Status: DC | PRN

## 2021-09-16 MED ORDER — HYDROMORPHONE HCL 1 MG/ML IJ SOLN
INTRAMUSCULAR | Status: AC
  Administered 2021-09-16: 0.25 mg via INTRAVENOUS
  Filled 2021-09-16: qty 1

## 2021-09-16 MED ORDER — CHLORHEXIDINE GLUCONATE CLOTH 2 % EX PADS: 6.0000 | MEDICATED_PAD | Freq: Once | CUTANEOUS | Status: DC

## 2021-09-16 MED ORDER — DEXMEDETOMIDINE (PRECEDEX) IN NS 20 MCG/5ML (4 MCG/ML) IV SYRINGE
PREFILLED_SYRINGE | INTRAVENOUS | Status: DC | PRN
  Administered 2021-09-16 (×2): 4 ug via INTRAVENOUS

## 2021-09-16 MED ORDER — CALCIUM CHLORIDE 10 % IV SOLN
INTRAVENOUS | Status: AC
  Filled 2021-09-16: qty 10

## 2021-09-16 MED ORDER — ONDANSETRON HCL 4 MG/2ML IJ SOLN: 4.0000 mg | Freq: Four times a day (QID) | INTRAMUSCULAR | Status: DC | PRN

## 2021-09-16 MED ORDER — LIDOCAINE HCL (CARDIAC) PF 100 MG/5ML IV SOSY
PREFILLED_SYRINGE | INTRAVENOUS | Status: DC | PRN
  Administered 2021-09-16: 100 mg via INTRAVENOUS

## 2021-09-16 MED ORDER — SORBITOL 70 % SOLN
30.0000 mL | Freq: Every day | Status: DC | PRN
  Filled 2021-09-16: qty 30

## 2021-09-16 MED ORDER — GUAIFENESIN-DM 100-10 MG/5ML PO SYRP: 15.0000 mL | ORAL_SOLUTION | ORAL | Status: DC | PRN

## 2021-09-16 MED ORDER — VANCOMYCIN HCL IN DEXTROSE 1-5 GM/200ML-% IV SOLN: 1000.0000 mg | INTRAVENOUS | Status: AC

## 2021-09-16 MED ORDER — VISTASEAL 10 ML SINGLE DOSE KIT
PACK | CUTANEOUS | Status: DC | PRN
  Administered 2021-09-16: 10 mL via TOPICAL

## 2021-09-16 MED ORDER — HYDROCODONE-ACETAMINOPHEN 5-325 MG PO TABS
2.0000 | ORAL_TABLET | Freq: Four times a day (QID) | ORAL | Status: DC | PRN
  Administered 2021-09-16 – 2021-09-18 (×7): 2 via ORAL
  Filled 2021-09-16 (×7): qty 2

## 2021-09-16 MED ORDER — HEPARIN 30,000 UNITS/1000 ML (OHS) CELLSAVER SOLUTION
Status: AC
  Filled 2021-09-16: qty 1000

## 2021-09-16 MED ORDER — FAMOTIDINE 20 MG PO TABS: 20.0000 mg | ORAL_TABLET | Freq: Once | ORAL | Status: AC

## 2021-09-16 MED ORDER — ENOXAPARIN SODIUM 40 MG/0.4ML IJ SOSY
40.0000 mg | PREFILLED_SYRINGE | INTRAMUSCULAR | Status: DC
  Administered 2021-09-17 – 2021-09-18 (×2): 40 mg via SUBCUTANEOUS
  Filled 2021-09-16 (×2): qty 0.4

## 2021-09-16 MED ORDER — DEXAMETHASONE SODIUM PHOSPHATE 10 MG/ML IJ SOLN
INTRAMUSCULAR | Status: DC | PRN
  Administered 2021-09-16: 10 mg via INTRAVENOUS

## 2021-09-16 MED ORDER — ONDANSETRON HCL 4 MG/2ML IJ SOLN
INTRAMUSCULAR | Status: DC | PRN
  Administered 2021-09-16 (×2): 4 mg via INTRAVENOUS

## 2021-09-16 MED ORDER — APREPITANT 40 MG PO CAPS
ORAL_CAPSULE | ORAL | Status: AC
  Administered 2021-09-16: 40 mg via ORAL
  Filled 2021-09-16: qty 1

## 2021-09-16 MED ORDER — VITAMIN B-12 1000 MCG PO TABS
1000.0000 ug | ORAL_TABLET | Freq: Every day | ORAL | Status: DC
  Administered 2021-09-17 – 2021-09-18 (×2): 1000 ug via ORAL
  Filled 2021-09-16 (×2): qty 1

## 2021-09-16 MED ORDER — VITAMIN E 45 MG (100 UNIT) PO CAPS
400.0000 [IU] | ORAL_CAPSULE | Freq: Every day | ORAL | Status: DC
  Administered 2021-09-17 – 2021-09-18 (×2): 400 [IU] via ORAL
  Filled 2021-09-16 (×2): qty 4

## 2021-09-16 MED ORDER — VANCOMYCIN HCL IN DEXTROSE 1-5 GM/200ML-% IV SOLN
1000.0000 mg | Freq: Two times a day (BID) | INTRAVENOUS | Status: AC
  Administered 2021-09-16 – 2021-09-17 (×2): 1000 mg via INTRAVENOUS
  Filled 2021-09-16 (×2): qty 200

## 2021-09-16 MED ORDER — ONDANSETRON HCL 4 MG/2ML IJ SOLN
INTRAMUSCULAR | Status: AC
  Filled 2021-09-16: qty 2

## 2021-09-16 MED ORDER — ACETAMINOPHEN 500 MG PO TABS
1000.0000 mg | ORAL_TABLET | Freq: Once | ORAL | Status: AC
  Administered 2021-09-16: 1000 mg via ORAL

## 2021-09-16 MED ORDER — ACETAMINOPHEN 500 MG PO TABS
ORAL_TABLET | ORAL | Status: AC
  Filled 2021-09-16: qty 2

## 2021-09-16 MED ORDER — ROCURONIUM BROMIDE 100 MG/10ML IV SOLN
INTRAVENOUS | Status: DC | PRN
Start: 2021-09-16 — End: 2021-09-16
  Administered 2021-09-16 (×2): 20 mg via INTRAVENOUS
  Administered 2021-09-16: 10 mg via INTRAVENOUS
  Administered 2021-09-16: 50 mg via INTRAVENOUS

## 2021-09-16 MED ORDER — ACETAMINOPHEN 325 MG PO TABS: 325.0000 mg | ORAL_TABLET | ORAL | Status: DC | PRN

## 2021-09-16 MED ORDER — NITROGLYCERIN 0.2 MG/ML ON CALL CATH LAB
INTRAVENOUS | Status: DC | PRN
  Administered 2021-09-16 (×2): 200 ug via INTRAVENOUS

## 2021-09-16 MED ORDER — MAGNESIUM OXIDE -MG SUPPLEMENT 400 (240 MG) MG PO TABS
400.0000 mg | ORAL_TABLET | Freq: Every day | ORAL | Status: DC
  Administered 2021-09-17 – 2021-09-18 (×2): 400 mg via ORAL
  Filled 2021-09-16 (×2): qty 1

## 2021-09-16 MED ORDER — ALBUMIN HUMAN 5 % IV SOLN
INTRAVENOUS | Status: AC
  Filled 2021-09-16: qty 500

## 2021-09-16 MED ORDER — PROPOFOL 10 MG/ML IV BOLUS
INTRAVENOUS | Status: AC
  Filled 2021-09-16: qty 20

## 2021-09-16 MED ORDER — HYDROMORPHONE HCL 1 MG/ML IJ SOLN
0.2500 mg | INTRAMUSCULAR | Status: AC | PRN
  Administered 2021-09-16 (×6): 0.25 mg via INTRAVENOUS

## 2021-09-16 MED ORDER — CHLORHEXIDINE GLUCONATE 0.12 % MT SOLN
OROMUCOSAL | Status: AC
  Administered 2021-09-16: 15 mL via OROMUCOSAL
  Filled 2021-09-16: qty 15

## 2021-09-16 MED ORDER — ALBUTEROL SULFATE (2.5 MG/3ML) 0.083% IN NEBU: 3.0000 mL | INHALATION_SOLUTION | Freq: Four times a day (QID) | RESPIRATORY_TRACT | Status: DC | PRN

## 2021-09-16 MED ORDER — HYDROMORPHONE HCL 1 MG/ML IJ SOLN
1.0000 mg | Freq: Once | INTRAMUSCULAR | Status: AC | PRN
  Administered 2021-09-16: 0.5 mg via INTRAVENOUS
  Filled 2021-09-16: qty 1

## 2021-09-16 MED ORDER — SUGAMMADEX SODIUM 200 MG/2ML IV SOLN
INTRAVENOUS | Status: DC | PRN
  Administered 2021-09-16: 200 mg via INTRAVENOUS

## 2021-09-16 MED ORDER — CHLORHEXIDINE GLUCONATE 0.12 % MT SOLN: 15.0000 mL | Freq: Once | OROMUCOSAL | Status: AC

## 2021-09-16 MED ORDER — SODIUM CHLORIDE 0.9 % IV SOLN
500.0000 mL | Freq: Once | INTRAVENOUS | Status: AC | PRN
  Administered 2021-09-17: 500 mL via INTRAVENOUS

## 2021-09-16 MED ORDER — ASPIRIN EC 81 MG PO TBEC: 81.0000 mg | DELAYED_RELEASE_TABLET | Freq: Every day | ORAL | Status: DC

## 2021-09-16 MED ORDER — ACETAMINOPHEN 10 MG/ML IV SOLN
INTRAVENOUS | Status: AC
  Filled 2021-09-16: qty 100

## 2021-09-16 MED ORDER — HEPARIN 5000 UNITS IN NS 1000 ML (FLUSH)
INTRAMUSCULAR | Status: DC | PRN
  Administered 2021-09-16: 200 mL via INTRAMUSCULAR

## 2021-09-16 MED ORDER — VASOPRESSIN 20 UNIT/ML IV SOLN
INTRAVENOUS | Status: DC | PRN
  Administered 2021-09-16: 2 [IU] via INTRAVENOUS

## 2021-09-16 MED ORDER — CLOPIDOGREL BISULFATE 75 MG PO TABS
75.0000 mg | ORAL_TABLET | Freq: Every day | ORAL | Status: DC
  Administered 2021-09-17 – 2021-09-18 (×2): 75 mg via ORAL
  Filled 2021-09-16 (×2): qty 1

## 2021-09-16 MED ORDER — VANCOMYCIN HCL IN DEXTROSE 1-5 GM/200ML-% IV SOLN
INTRAVENOUS | Status: AC
  Administered 2021-09-16: 1000 mg via INTRAVENOUS
  Filled 2021-09-16: qty 200

## 2021-09-16 MED ORDER — HEMOSTATIC AGENTS (NO CHARGE) OPTIME
TOPICAL | Status: DC | PRN
  Administered 2021-09-16: 2 via TOPICAL

## 2021-09-16 MED ORDER — SODIUM CHLORIDE 0.9 % IV SOLN: INTRAVENOUS | Status: DC

## 2021-09-16 MED ORDER — HEPARIN SODIUM (PORCINE) 5000 UNIT/ML IJ SOLN
INTRAMUSCULAR | Status: AC
  Filled 2021-09-16: qty 1

## 2021-09-16 MED ORDER — LACTATED RINGERS IV SOLN: INTRAVENOUS | Status: DC

## 2021-09-16 MED ORDER — EPINEPHRINE 1 MG/10ML IJ SOSY
PREFILLED_SYRINGE | INTRAMUSCULAR | Status: DC | PRN
  Administered 2021-09-16: .2 mg via INTRAVENOUS

## 2021-09-16 MED ORDER — ASPIRIN EC 81 MG PO TBEC
81.0000 mg | DELAYED_RELEASE_TABLET | Freq: Every day | ORAL | Status: DC
  Administered 2021-09-17 – 2021-09-18 (×2): 81 mg via ORAL
  Filled 2021-09-16 (×2): qty 1

## 2021-09-16 MED ORDER — SENNOSIDES-DOCUSATE SODIUM 8.6-50 MG PO TABS: 1.0000 | ORAL_TABLET | Freq: Every evening | ORAL | Status: DC | PRN

## 2021-09-16 MED ORDER — CHLORHEXIDINE GLUCONATE CLOTH 2 % EX PADS
6.0000 | MEDICATED_PAD | Freq: Every day | CUTANEOUS | Status: DC
  Administered 2021-09-16 – 2021-09-17 (×2): 6 via TOPICAL

## 2021-09-16 MED ORDER — PHENYLEPHRINE HCL-NACL 20-0.9 MG/250ML-% IV SOLN
INTRAVENOUS | Status: DC | PRN
  Administered 2021-09-16: 15 ug/min via INTRAVENOUS

## 2021-09-16 MED ORDER — FAMOTIDINE 20 MG PO TABS
ORAL_TABLET | ORAL | Status: AC
  Administered 2021-09-16: 20 mg via ORAL
  Filled 2021-09-16: qty 1

## 2021-09-16 MED ORDER — MIDAZOLAM HCL 2 MG/2ML IJ SOLN
INTRAMUSCULAR | Status: DC | PRN
  Administered 2021-09-16 (×2): 1 mg via INTRAVENOUS

## 2021-09-16 MED ORDER — ORAL CARE MOUTH RINSE: 15.0000 mL | Freq: Once | OROMUCOSAL | Status: AC

## 2021-09-16 MED ORDER — METOCLOPRAMIDE HCL 5 MG/ML IJ SOLN: 10.0000 mg | Freq: Once | INTRAMUSCULAR | Status: DC | PRN

## 2021-09-16 SURGICAL SUPPLY — 84 items
BAG DECANTER FOR FLEXI CONT (MISCELLANEOUS) ×3 IMPLANT
BALLN DORADO 8X100X80 (BALLOONS) ×3
BALLN DORADO 8X60X80 (BALLOONS) ×3
BALLOON DORADO 8X100X80 (BALLOONS) IMPLANT
BALLOON DORADO 8X60X80 (BALLOONS) IMPLANT
BLADE SURG 15 STRL LF DISP TIS (BLADE) ×2 IMPLANT
BLADE SURG 15 STRL SS (BLADE) ×1
BLADE SURG SZ11 CARB STEEL (BLADE) ×3 IMPLANT
BOOT SUTURE AID YELLOW STND (SUTURE) ×3 IMPLANT
BRUSH SCRUB EZ  4% CHG (MISCELLANEOUS) ×1
BRUSH SCRUB EZ 4% CHG (MISCELLANEOUS) ×2 IMPLANT
CATH BEACON 5 .035 40 KMP TP (CATHETERS) IMPLANT
CATH BEACON 5 .038 40 KMP TP (CATHETERS) ×2
CATH TEMPO 5F RIM 65CM (CATHETERS) ×1 IMPLANT
CATH VS15FR (CATHETERS) ×1 IMPLANT
CHLORAPREP W/TINT 26 (MISCELLANEOUS) ×3 IMPLANT
DERMABOND ADVANCED (GAUZE/BANDAGES/DRESSINGS) ×2
DERMABOND ADVANCED .7 DNX12 (GAUZE/BANDAGES/DRESSINGS) ×2 IMPLANT
DRAPE C-ARM XRAY 36X54 (DRAPES) ×3 IMPLANT
DRAPE INCISE IOBAN 66X45 STRL (DRAPES) ×3 IMPLANT
DRSG OPSITE POSTOP 4X6 (GAUZE/BANDAGES/DRESSINGS) ×2 IMPLANT
ELECT CAUTERY BLADE 6.4 (BLADE) ×3 IMPLANT
ELECT REM PT RETURN 9FT ADLT (ELECTROSURGICAL) ×3
ELECTRODE REM PT RTRN 9FT ADLT (ELECTROSURGICAL) ×2 IMPLANT
GAUZE 4X4 16PLY ~~LOC~~+RFID DBL (SPONGE) ×3 IMPLANT
GLIDEWIRE ADV .035X180CM (WIRE) ×2 IMPLANT
GLOVE SURG SYN 7.0 (GLOVE) ×6 IMPLANT
GLOVE SURG SYN 7.0 PF PI (GLOVE) ×4 IMPLANT
GOWN STRL REUS W/ TWL LRG LVL3 (GOWN DISPOSABLE) ×2 IMPLANT
GOWN STRL REUS W/ TWL XL LVL3 (GOWN DISPOSABLE) ×4 IMPLANT
GOWN STRL REUS W/TWL LRG LVL3 (GOWN DISPOSABLE) ×1
GOWN STRL REUS W/TWL XL LVL3 (GOWN DISPOSABLE) ×2
GUIDEWIRE ANGLED .035 180CM (WIRE) ×1 IMPLANT
HEMOSTAT SURGICEL 2X3 (HEMOSTASIS) ×3 IMPLANT
INTRODUCER 7FR 23CM (INTRODUCER) ×2 IMPLANT
IV NS 500ML (IV SOLUTION) ×1
IV NS 500ML BAXH (IV SOLUTION) ×2 IMPLANT
KIT ENCORE 26 ADVANTAGE (KITS) ×2 IMPLANT
KIT TURNOVER KIT A (KITS) ×3 IMPLANT
LABEL OR SOLS (LABEL) ×3 IMPLANT
LOOP RED MAXI  1X406MM (MISCELLANEOUS) ×2
LOOP VESSEL MAXI 1X406 RED (MISCELLANEOUS) ×4 IMPLANT
LOOP VESSEL MINI 0.8X406 BLUE (MISCELLANEOUS) ×4 IMPLANT
LOOPS BLUE MINI 0.8X406MM (MISCELLANEOUS) ×2
MANIFOLD NEPTUNE II (INSTRUMENTS) ×3 IMPLANT
NDL SAFETY ECLIPSE 18X1.5 (NEEDLE) ×2 IMPLANT
NEEDLE HYPO 18GX1.5 SHARP (NEEDLE) ×1
NS IRRIG 500ML POUR BTL (IV SOLUTION) ×3 IMPLANT
PACK ANGIOGRAPHY (CUSTOM PROCEDURE TRAY) ×1 IMPLANT
PACK BASIN MAJOR ARMC (MISCELLANEOUS) ×3 IMPLANT
PACK UNIVERSAL (MISCELLANEOUS) ×3 IMPLANT
PATCH CAROTID ECM VASC 1X10 (Prosthesis & Implant Heart) ×2 IMPLANT
PENCIL ELECTRO HAND CTR (MISCELLANEOUS) ×1 IMPLANT
SET WALTER ACTIVATION W/DRAPE (SET/KITS/TRAYS/PACK) ×3 IMPLANT
SHEATH 9FRX11 (SHEATH) ×2 IMPLANT
SPONGE T-LAP 18X18 ~~LOC~~+RFID (SPONGE) ×6 IMPLANT
STENT LIFESTAR 9X60 (Permanent Stent) ×1 IMPLANT
STENT LIFESTAR 9X80 (Permanent Stent) ×1 IMPLANT
STENT LIFESTREAM 7X58X80 (Permanent Stent) ×2 IMPLANT
STENT VIABAHN 9X10X120 (Permanent Stent) ×1 IMPLANT
STENT VIABAHN 9X5X120 (Permanent Stent) ×1 IMPLANT
STENT VIABAHN 9X7.5X120 (Permanent Stent) ×1 IMPLANT
SUT MNCRL 4-0 (SUTURE) ×1
SUT MNCRL 4-0 27XMFL (SUTURE) ×2
SUT PROLENE 5 0 RB 1 DA (SUTURE) ×6 IMPLANT
SUT PROLENE 6 0 BV (SUTURE) ×12 IMPLANT
SUT PROLENE 7 0 BV 1 (SUTURE) ×6 IMPLANT
SUT SILK 2 0 (SUTURE) ×1
SUT SILK 2-0 18XBRD TIE 12 (SUTURE) ×2 IMPLANT
SUT SILK 3 0 (SUTURE) ×1
SUT SILK 3-0 18XBRD TIE 12 (SUTURE) ×2 IMPLANT
SUT SILK 4 0 (SUTURE) ×1
SUT SILK 4-0 18XBRD TIE 12 (SUTURE) ×2 IMPLANT
SUT VIC AB 2-0 CT1 27 (SUTURE) ×2
SUT VIC AB 2-0 CT1 TAPERPNT 27 (SUTURE) ×4 IMPLANT
SUT VIC AB 3-0 SH 27 (SUTURE) ×1
SUT VIC AB 3-0 SH 27X BRD (SUTURE) ×2 IMPLANT
SUT VICRYL+ 3-0 36IN CT-1 (SUTURE) ×6 IMPLANT
SUTURE MNCRL 4-0 27XMF (SUTURE) ×2 IMPLANT
SYR 20ML LL LF (SYRINGE) ×3 IMPLANT
SYR 5ML LL (SYRINGE) ×3 IMPLANT
TRAY FOLEY MTR SLVR 16FR STAT (SET/KITS/TRAYS/PACK) ×3 IMPLANT
TUBING ART PRESS 48 MALE/FEM (TUBING) ×1 IMPLANT
WATER STERILE IRR 500ML POUR (IV SOLUTION) ×3 IMPLANT

## 2021-09-16 NOTE — Op Note (Signed)
OPERATIVE NOTE   PROCEDURE: Bilateral common femoral and superficial femoral endarterectomy with Cormatrix patch angioplasty. Open angioplasty and stent placement bilateral common iliac arteries using the kissing balloon technique. Open angioplasty and stent placement right external iliac artery Open angioplasty and stent placement left external iliac artery Additional open angioplasty and stent placement for treatment of extravasation at the noncovered portion of the left iliac system  PRE-OPERATIVE DIAGNOSIS: Atherosclerotic occlusive disease bilateral lower extremities with lifestyle limiting claudication and mild rest pain symptoms; hypertension  POST-OPERATIVE DIAGNOSIS: Same  CO-SURGEON: Renford Dills, MD and Annice Needy, M.D.  ASSISTANT(S): None  ANESTHESIA: general  ESTIMATED BLOOD LOSS: 250 cc  FINDING(S): Profound calcific plaque noted bilaterally extending past the initial bifurcation of the profunda femoris arteries as well as down the extensive length of the SFA  SPECIMEN(S):  Calcific plaque from the common femoral, superficial femoral and the profunda femoris arteries bilaterally  INDICATIONS:   Ladean Raya Kaminski 71 y.o. y.o.female who presents with complaints of lifestyle limiting claudication and pain continuously in the feet bilaterally. The patient has documented severe atherosclerotic occlusive disease and has undergone multiple minimally invasive treatments in the past. However, at this point his primary area of stricture stenosis resides in the common femoral and origins of the superficial femoral and profunda femoris extending into these arteries and therefore this is not amenable to intervention and he is now undergoing open endarterectomy. The risks and benefits of been reviewed with the patient, all questions have answered; alternative therapies have been reviewed as well and the patient has agreed to proceed with surgical open  repair.  DESCRIPTION: After obtaining full informed written consent, the patient was brought back to the operating room and placed supine upon the operating table.  The patient received IV antibiotics prior to induction.  After obtaining adequate anesthesia, the patient was prepped and draped in the standard fashion for: bilateral femoral exposure.  Co-surgeons are required because this is a bilateral procedure with work being performed simultaneously from both the right femoral and left femoral approach.  This also expedite the procedure making a shorter operative time reducing complications and improving patient safety.  Attention was turned to the bilateral groin with Dr. Wyn Quaker working on the right and myself working on the left of the patient.  Vertical  incisions were made over the common femoral artery and dissected down to the common femoral artery with electrocautery.  I dissected out the common femoral artery from the distal external iliac artery (identified by the superficial circumflex vessels) down to the femoral bifurcation.  On initial inspection, the common femoral artery was: Densely calcified and there was no palpable pulse noted bilaterally.    Subsequently the dissection was continued to include all circumflex branches and the profunda femoral artery and superficial femoral artery. The superficial femoral artery was dissected circumferentially for a distance of approximately 3-4 cm and the profunda femoris was dissected circumferentially for a centimeter or 2.  Vesseloops were then placed around the arteries and any isolated branches.  Both of the groins were treated simultaneously as described above. Control of all branches was obtained with vessel loops.  A softer area in the distal external iliac artery amendable to clamping was identified.    The patient was given 5000 units of Heparin intravenously, which was a therapeutic bolus, additional doses were given later in the case.   After  waiting 3 minutes, the distal external iliac artery was clamped and placed all circumflex branches, and  the profunda and superficial femoral arteries under tension.  Arteriotomy was made in the common femoral artery with a 11-blade and extended it with a Potts scissor proximally and distally extending the distal end down the SFA for approximately 2-3 cm.   Endarterectomy was then performed under direct visualization using a freer elevator and a right angle from the mid common femoral extending up both proximally and distally. Proximally the endarterectomy was brought up to the level of the clamp where a clean edge was obtained. Distally the endarterectomy was carried down to a soft spot in the SFA where a feathered edge would was obtained.  7-0 Prolene interrupted tacking sutures were placed to secure the leading edge of the plaque in the SFA.  The profunda femoris was treated with an eversion technique extending endarterectomy distally to obtain a featheredge on both sides right and left.  We then turned our attention to the interventional portion of the case.  A 9 French 11 cm Pinnacle sheath was then advanced into the right external iliac artery through the arteriotomy.  Hand-injection of contrast was performed and then the wire and a Kumpe catheter negotiated into the aorta where the Kumpe catheter confirmed true luminal positioning.  I worked from the right Dr. Wyn Quaker was working from the left and he advanced a 9 [place] sheath as well.  Ultimately using a combination of Kumpe catheters rim catheters and a V S1 catheter we were able to get a wire from the left out the right endarterectomy site and also from the right out the left endarterectomy site.  At this point we did have to switch to 7 [place] sheaths as we needed the 23 cm length.  After trying several maneuvers we successfully were able to advance an advantage wire into the true lumen of the aorta from the right side this was confirmed by  hand-injection through the Kumpe catheter.  We then advanced a floppy Glidewire from the left side out the right side.  By advancing the right 23 cm sheath into the aorta this carried the floppy Glidewire with it a Kumpe catheter was then used to negotiate the wire into the the aorta which was then confirmed by hand-injection through the Kumpe catheter from the left femoral approach.  At this point we now had wires from the femoral endarterectomy sites into the aorta both of which have been confirmed with hand-injection through the Kumpe catheters.  Beginning with a 7 mm x 58 mm lifestream stent from both sides we performed angioplasty of the aortic bifurcations and proximal common iliacs using the kissing balloon technique.  Simultaneous inflation to 10 atm was performed.  At this point we exchanged the 7 French sheath back to the 9 French 11 cm Pinnacle sheaths.  We then addressed the right side next by hand-injection of contrast we verified the location of the iliac bifurcation and I deployed a 9 mm x 80 mm life star stent extending into the external iliac artery.  I then deployed a 9 mm x 50 mm Viabahn stent down to the ileal inguinal ligament.  The entire right side was then postdilated with an 8 mm balloon again using bilateral 8 mm balloons in the most proximal stents (the kissing balloon technique).  Hand-injection contrast then verified patency as well as preservation of the internal iliac on that side.  We then addressed the left side hand-injection contrast demonstrated there was indeed a internal iliac artery and we selected a 9 mm x 60 mm life  star stent and deployed this followed by a 9 mm x 75 mm Viabahn stent again extending it down to the ileal inguinal ligament.  We then postdilated this side with the 8 mm balloon.  Follow-up injection demonstrated an area of extravasation and we have elected to bridge the life stream stent and the Viabahn stent with a 9 mm x 100 mm Viabahn which was deployed  and then postdilated with the 8 mm balloon.  Follow-up imaging demonstrated complete cessation of extravasation with wide patency of the common iliac as well as external iliac on the left.  Having completed the bilateral iliac reconstruction we once again return to closure of the femoral endarterectomies.  At this point, working on the left side first Dr. Wyn Quaker fashioned a core matrix patch for the geometry of the arteriotomy.  The patch was sewn to the artery with 2 running stitches of 6-0 Prolene, running from each end.  Prior to completing the patch angioplasty, the profunda femoral artery was flushed as was the superficial femoral artery. The system was then forward flushed. The endarterectomy site was then irrigated copiously with heparinized saline. The patch angioplasty was completed in the usual fashion.  Flow was then reestablished first to the profunda femoris and then the superficial femoral artery. Any gaps or bleeding sites in the suture line were easily controlled with a 6-0 Prolene suture.  Next, I fashioned a core matrix patch for the geometry of the right femoral arteriotomy.  The patch was sewn to the artery with 2 running stitches of 6-0 Prolene, running from each end.  Prior to completing the patch angioplasty, the profunda femoral artery was flushed as was the superficial femoral artery. The system was then forward flushed. The endarterectomy site was then irrigated copiously with heparinized saline. The patch angioplasty was completed in the usual fashion.  Flow was then reestablished first to the profunda femoris and then the superficial femoral artery. Any gaps or bleeding sites in the suture line were easily controlled with a 6-0 Prolene suture.   Both right and left groins were then irrigated copiously with sterile saline and subsequently Evicel and Surgicel were placed in the wound. The incision was repaired with a double layer of 2-0 Vicryl, a double layer of 3-0 Vicryl, and a layer  of 4-0 Monocryl in a subcuticular fashion.  The skin was cleaned, dried, and reinforced with Dermabond.  COMPLICATIONS: None  CONDITION: Almon Register, M.D. [facility] Office: [phone]  09/16/2021, 3:12 PM

## 2021-09-16 NOTE — Anesthesia Procedure Notes (Signed)
Arterial Line Insertion ?Start/End3/22/2023 10:48 AM, 09/16/2021 10:52 AM ?Performed by: Iran Ouch, MD, Lucita Ferrara, RN ? Patient location: OR. ?Preanesthetic checklist: patient identified, IV checked, site marked, risks and benefits discussed, surgical consent, monitors and equipment checked, pre-op evaluation, timeout performed and anesthesia consent ?Lidocaine 1% used for infiltration ?Right, radial was placed ?Catheter size: 20 G ?Hand hygiene performed  and maximum sterile barriers used  ? ?Attempts: 1 ?Procedure performed using ultrasound guided technique. ?Following insertion, dressing applied. ?Post procedure assessment: normal and unchanged ? ?Patient tolerated the procedure well with no immediate complications. ?Additional procedure comments: Placed by Lucita Ferrara, SRNA under MDA and CRNA supervsion. ? ? ? ?

## 2021-09-16 NOTE — Anesthesia Procedure Notes (Signed)
Procedure Name: Intubation ?Date/Time: 09/16/2021 10:46 AM ?Performed by: Loletha Grayer, CRNA ?Pre-anesthesia Checklist: Patient identified, Patient being monitored, Timeout performed, Emergency Drugs available and Suction available ?Patient Re-evaluated:Patient Re-evaluated prior to induction ?Oxygen Delivery Method: Circle system utilized ?Preoxygenation: Pre-oxygenation with 100% oxygen ?Induction Type: IV induction ?Ventilation: Mask ventilation without difficulty ?Laryngoscope Size: Charlyne Petrin and 2 ?Grade View: Grade I ?Tube type: Oral ?Tube size: 7.0 mm ?Number of attempts: 1 ?Airway Equipment and Method: Stylet and Oral airway ?Placement Confirmation: ETT inserted through vocal cords under direct vision, positive ETCO2 and breath sounds checked- equal and bilateral ?Secured at: 21 cm ?Tube secured with: Tape ?Dental Injury: Teeth and Oropharynx as per pre-operative assessment  ?Comments: Placed by Lucita Ferrara, SRNA under supervision of MDA and CRNA ? ? ? ? ?

## 2021-09-16 NOTE — H&P (Signed)
?[facility] ?Admission History & Physical ? ?MRN : [id number] ? ?Wendy Arias is a 71 y.o. (1951/05/10) female who presents with chief complaint of No chief complaint on file. ?. ? ?History of Present Illness: Patient presents today for surgical revascularization with hybrid procedure for her severe bilateral lower extremity ischemia.  Continues with rest pain.  No new complaints today.  No fevers or chills. ? ?Current Facility-Administered Medications  ?Medication Dose Route Frequency Provider Last Rate Last Admin  ? acetaminophen (TYLENOL) 500 MG tablet           ? Chlorhexidine Gluconate Cloth 2 % PADS 6 each  6 each Topical Once Kris Hartmann, NP      ? And  ? Chlorhexidine Gluconate Cloth 2 % PADS 6 each  6 each Topical Once Kris Hartmann, NP      ? lactated ringers infusion   Intravenous Continuous Arita Miss, MD      ? ? ?Past Medical History:  ?Diagnosis Date  ? (HFpEF) heart failure with preserved ejection fraction ([place]) 06/07/2004  ? a.) TTE 06/07/2004: EF 50-55%; inf HK. b.) TTE 04/17/2018: EF 45%; GLS -14.6%; inferoseptal and inferoposterior HK; sev LA enlargement; mild PR, mod TR, sev MR; G2DD. c.) TTE 12/05/2018: EF >55%, mild LVH; mild MR. d.) TTE 12/18/2020: EF >55%; mild LA enlargement; triv TR/PR, mod MR; G1DD.  ? Anxiety   ? Aortic atherosclerosis ([place])   ? Asthma   ? CAD (coronary artery disease) 06/08/2004  ? a.) NSTEMI 06/05/2004. b.) LHC 06/08/2004: EF 70%; 50% mLAD, 50% dLAD, 99% OM3, 50% pRCA, 50% mRCA; PCI to OM3 placing a DES x 1 (unknown type). c.) Lexi scan 09/14/2021: normal LV function with no ischemia or scar.  ? COPD (chronic obstructive pulmonary disease) ([place])   ? Depression   ? Diverticulosis   ? DOE (dyspnea on exertion)   ? Empyema of right pleural space ([place]) 02/16/2018  ? HLD (hyperlipidemia)   ? Hypertension   ? IDA (iron deficiency anemia)   ? Ischemic cardiomyopathy   ? NSTEMI (non-ST elevated myocardial infarction) ([place]) 06/05/2004  ? a.)  PCI  on 06/08/2004 placing a DES x1 (unknown type) to 99% lesion in OM3.  ? Osteoporosis   ? Peripheral vascular disease ([place])   ? PONV (postoperative nausea and vomiting)   ? ? ?Past Surgical History:  ?Procedure Laterality Date  ? COLONOSCOPY WITH PROPOFOL N/A 04/12/2017  ? Procedure: COLONOSCOPY WITH PROPOFOL;  Surgeon: Lollie Sails, MD;  Location: [facility] ENDOSCOPY;  Service: Endoscopy;  Laterality: N/A;  ? CORONARY ANGIOPLASTY WITH STENT PLACEMENT Left 06/08/2004  ? Procedure: CORONARY ANGIOPLASTY WITH STENT PLACEMENT; Location: [facility]; Surgeon: Katrine Coho, MD  ? ENDOBRONCHIAL ULTRASOUND N/A 11/28/2017  ? Procedure: ENDOBRONCHIAL ULTRASOUND;  Surgeon: Laverle Hobby, MD;  Location: [facility] ORS;  Service: Pulmonary;  Laterality: N/A;  ? LOWER EXTREMITY ANGIOGRAPHY Right 08/12/2021  ? Procedure: Lower Extremity Angiography;  Surgeon: Algernon Huxley, MD;  Location: [facility];  Service: Cardiovascular;  Laterality: Right;  ? PARTIAL HYSTERECTOMY N/A   ? REDUCTION MAMMAPLASTY    ? VIDEO ASSISTED THORACOSCOPY (VATS)/THOROCOTOMY Right 03/2018  ? Procedure: VIDEO ASSISTED THORACOSCOPY (VATS)/THOROCOTOMY (partial decortication, loculated empyema); Location: [facility]  ? ? ? ?Social History  ? ?Tobacco Use  ? Smoking status: Some Days  ?  Packs/day: 0.50  ?  Years: 20.00  ?  Pack years: 10.00  ?  Types: Cigarettes  ? Smokeless tobacco: Never  ?Vaping Use  ?  Vaping Use: Never used  ?Substance Use Topics  ? Alcohol use: Never  ?  Alcohol/week: 3.0 standard drinks  ?  Types: 1 Glasses of wine, 2 Cans of beer per week  ?  Comment: rarely  ? Drug use: Never  ? ? ? ?Family History  ?Problem Relation Age of Onset  ? Breast cancer Mother 80  ? Hypertension Mother   ? Dementia Mother   ? Breast cancer Maternal Aunt 70  ? Breast cancer Other 35  ? Hypertension Father   ? Heart disease Father   ? Diabetes Father   ? Diabetes Sister   ? ? ?Allergies  ?Allergen Reactions  ? Penicillins Other (See Comments)  ?  Has patient  had a PCN reaction causing immediate rash, facial/tongue/throat swelling, SOB or lightheadedness with hypotension: Yes ?Has patient had a PCN reaction causing severe rash involving mucus membranes or skin necrosis: Unknown ?Has patient had a PCN reaction that required hospitalization: Unknown ?Has patient had a PCN reaction occurring within the last 10 years: Unknown ?If all of the above answers are "NO", then may proceed with Cephalosporin use. ?Pt has taken augmentin before  ? ? ?  ?REVIEW OF SYSTEMS (Negative unless checked) ?  ?Constitutional: '[]'$ Weight loss  '[]'$ Fever  '[]'$ Chills ?Cardiac: '[]'$ Chest pain   '[]'$ Chest pressure   '[]'$ Palpitations   '[]'$ Shortness of breath when laying flat   '[]'$ Shortness of breath at rest   '[]'$ Shortness of breath with exertion. ?Vascular:  '[x]'$ Pain in legs with walking   '[]'$ Pain in legs at rest   '[]'$ Pain in legs when laying flat   '[]'$ Claudication   '[]'$ Pain in feet when walking  '[x]'$ Pain in feet at rest  '[x]'$ Pain in feet when laying flat   '[]'$ History of DVT   '[]'$ Phlebitis   '[]'$ Swelling in legs   '[]'$ Varicose veins   '[]'$ Non-healing ulcers ?Pulmonary:   '[]'$ Uses home oxygen   '[]'$ Productive cough   '[]'$ Hemoptysis   '[]'$ Wheeze  '[]'$ COPD   '[]'$ Asthma ?Neurologic:  '[]'$ Dizziness  '[]'$ Blackouts   '[]'$ Seizures   '[]'$ History of stroke   '[]'$ History of TIA  '[]'$ Aphasia   '[]'$ Temporary blindness   '[]'$ Dysphagia   '[]'$ Weakness or numbness in arms   '[]'$ Weakness or numbness in legs ?Musculoskeletal:  '[x]'$ Arthritis   '[]'$ Joint swelling   '[x]'$ Joint pain   '[]'$ Low back pain ?Hematologic:  '[]'$ Easy bruising  '[]'$ Easy bleeding   '[]'$ Hypercoagulable state   '[]'$ Anemic  '[]'$ Hepatitis ?Gastrointestinal:  '[]'$ Blood in stool   '[]'$ Vomiting blood  '[x]'$ Gastroesophageal reflux/heartburn   '[]'$ Abdominal pain ?Genitourinary:  '[]'$ Chronic kidney disease   '[]'$ Difficult urination  '[]'$ Frequent urination  '[]'$ Burning with urination   '[]'$ Hematuria ?Skin:  '[]'$ Rashes   '[]'$ Ulcers   '[]'$ Wounds ?Psychological:  '[]'$ History of anxiety   '[]'$  History of major depression. ? ?Physical Examination ? ?Vitals:  ? 09/16/21  0839 09/16/21 0904  ?BP: (!) 189/82   ?Pulse: 79   ?Resp: 18   ?Temp:  98.3 ?F (36.8 ?C)  ?TempSrc:  Temporal  ?SpO2: 96%   ?Weight:  59 kg  ?Height:  5' 1.5" (1.562 m)  ? ?Body mass index is 24.17 kg/m?. ?Gen: WD/WN, NAD ?Head: /AT, No temporalis wasting. Prominent temp pulse not noted. ?Ear/Nose/Throat: Hearing grossly intact, nares w/o erythema or drainage, oropharynx w/o Erythema/Exudate,  ?Eyes: Conjunctiva clear, sclera non-icteric ?Neck: Trachea midline.  No JVD.  ?Pulmonary:  Good air movement, respirations not labored, no use of accessory muscles.  ?Cardiac: RRR, normal S1, S2. ?Vascular:  ?Vessel Right Left  ?Radial Palpable Palpable  ?    ?    ?    ?    ?    ?    ?  PT Not Palpable Not Palpable  ?DP Not Palpable Not Palpable  ? ? ?Musculoskeletal: M/S 5/5 throughout.  No deformity or atrophy.  ?Neurologic: Sensation grossly intact in extremities.  Symmetrical.  Speech is fluent. Motor exam as listed above. ?Psychiatric: Judgment intact, Mood & affect appropriate for pt's clinical situation. ?Dermatologic: No rashes or ulcers noted.  No cellulitis or open wounds. ? ? ? ? ? ?CBC ?Lab Results  ?Component Value Date  ? WBC 5.4 09/14/2021  ? HGB 14.0 09/14/2021  ? HCT 44.6 09/14/2021  ? MCV 92.3 09/14/2021  ? PLT 215 09/14/2021  ? ? ?BMET ?   ?Component Value Date/Time  ? NA 140 09/14/2021 0836  ? NA 138 10/31/2013 1541  ? K 4.1 09/14/2021 0836  ? K 4.5 10/31/2013 1541  ? CL 105 09/14/2021 0836  ? CL 101 10/31/2013 1541  ? CO2 25 09/14/2021 0836  ? CO2 29 10/31/2013 1541  ? GLUCOSE 82 09/14/2021 0836  ? GLUCOSE 96 10/31/2013 1541  ? BUN 14 09/14/2021 0836  ? BUN 10 10/31/2013 1541  ? CREATININE 0.74 09/14/2021 0836  ? CREATININE 0.91 10/31/2013 1541  ? CALCIUM 9.1 09/14/2021 0836  ? CALCIUM 9.1 10/31/2013 1541  ? GFRNONAA >60 09/14/2021 0836  ? GFRNONAA >60 10/31/2013 1541  ? GFRAA >60 04/13/2018 1030  ? GFRAA >60 10/31/2013 1541  ? ?Estimated Creatinine Clearance: 54 mL/min (by C-G formula based on SCr of  0.74 mg/dL). ? ?COAG ?Lab Results  ?Component Value Date  ? INR 1.1 05/26/2021  ? INR 1.21 04/12/2018  ? INR 1.26 04/10/2018  ? ? ?Radiology ?No results found. ? ? ?Assessment/Plan ?Pure hypercholesterole

## 2021-09-16 NOTE — Transfer of Care (Signed)
Immediate Anesthesia Transfer of Care Note ? ?Patient: Wendy Arias ? ?Procedure(s) Performed: ENDARTERECTOMY FEMORAL (Bilateral) ?INSERTION OF ILIAC STENT (Bilateral) ?APPLICATION OF CELL SAVER ? ?Patient Location: PACU ? ?Anesthesia Type:General ? ?Level of Consciousness: awake, alert , oriented and patient cooperative ? ?Airway & Oxygen Therapy: Patient Spontanous Breathing and Patient connected to face mask oxygen ? ?Post-op Assessment: Report given to RN and Post -op Vital signs reviewed and stable ? ?Post vital signs: Reviewed and stable ? ?Last Vitals:  ?Vitals Value Taken Time  ?BP 133/74 09/16/21 1502  ?Temp 36.3 ?C 09/16/21 1502  ?Pulse 69 09/16/21 1512  ?Resp 14 09/16/21 1512  ?SpO2 100 % 09/16/21 1512  ? ? ?Last Pain:  ?Vitals:  ? 09/16/21 0904  ?TempSrc: Temporal  ?PainSc: 0-No pain  ?   ? ?  ? ?Complications: No notable events documented. ?

## 2021-09-16 NOTE — Op Note (Signed)
OPERATIVE NOTE ? ? ?PROCEDURE: ?1.   Left common femoral, and superficial femoral artery endarterectomies ?2.   Right common femoral and superficial femoral artery endarterectomies ?3.   Kissing balloon expandable stent placements to bilateral common iliac arteries proximally with 7 mm diameter by 58 mm length lifestream stents postdilated with 8 mm balloons ?4.   Additional stent placement on the right x2 with a 9 mm diameter by 8 cm length life star stent and a 9 mm diameter by 5 cm length Viabahn stent to the external iliac artery ?5.   Additional stent placement on the left x2 with a 9 mm diameter by 6 cm length life star stent and a 9 mm diameter by 7.5 cm length Viabahn stent to the external iliac artery ?6.   Additional stent placement on the left for extravasation with a 9 mm diameter by 10 cm length Viabahn stent to bridge the noncovered portion ? ? ? ?PRE-OPERATIVE DIAGNOSIS: 1.Atherosclerotic occlusive disease bilateral lower extremities with rest pain ? ? ?POST-OPERATIVE DIAGNOSIS: Same ? ?SURGEON: [name], MD ? ?CO-surgeon: Hortencia Pilar, MD ? ?ANESTHESIA:  general ? ?ESTIMATED BLOOD LOSS: 250 cc ? ?FINDING(S): ?1.  significant plaque in bilateral common femoral and superficial femoral arteries ?2.  Severe aortoiliac disease ? ?SPECIMEN(S):  Bilateral common femoral, profunda femoris, and superficial femoral artery plaque. ? ?INDICATIONS:    ?Patient presents with rest pain of both lower extremities.  Bilateral femoral endarterectomies are planned to try to improve perfusion as well as aortoiliac intervention and potential right SFA intervention.  The risks and benefits as well as alternative therapies including intervention were reviewed in detail all questions were answered the patient agrees to proceed with surgery. ? ?DESCRIPTION: ?After obtaining full informed written consent, the patient was brought back to the operating room and placed supine upon the operating table.  The patient received IV  antibiotics prior to induction.  After obtaining adequate anesthesia, the patient was prepped and draped in the standard fashion appropriate time out is called.   ? ?With myself working on the left and Dr. Delana Meyer working on the right we began by dissecting out the femoral arteries on each side. Vertical incisions were created overlying both femoral arteries. The common femoral artery proximally, and superficial femoral artery, and primary profunda femoris artery branches were encircled with vessel loops and prepared for control. Both femoral arteries were found to have significant plaque from the common femoral artery into the profunda and superficial femoral arteries. ? ? ?6000 units of heparin was given and allowed circulate for 5 minutes.  Additional heparin was given as needed later ? ?Attention is then turned to the right femoral artery.  An arteriotomy is made with 11 blade and extended with Potts scissors in the common femoral artery and carried down onto the first 1-2 cm of the superficial artery. An endarterectomy was then performed. The [place] was used to create a plane. The proximal endpoint was cut flush with tenotomy scissors. This was in the proximal common femoral artery. An eversion endarterectomy was then performed for the first cm of the profunda femoris artery. Good backbleeding was then seen. The distal endpoint of the superficial femoral artery endarterectomy was created with gentle traction and the distal endpoint was fairly clean.  The artery was then left open for aorta iliac intervention.   ? ?The left femoral artery is then addressed. Arteriotomy is made in the common femoral artery and extended down into the first 2 to 3 cm of  the superficial femoral artery artery. Similarly, an endarterectomy was performed with the [facility]. The proximal endpoint was cut flush with tenotomy scissors in the proximal common femoral artery.  The initial portion of the profunda femoris  artery was addressed and treated with an eversion endarterectomy and this was performed with a hemostat and gentle traction. The arteriotomy was carried down onto the superficial femoral artery artery and the endarterectomy was continued to this point. The distal endpoint was gently removed with traction and a nice feathered distal endpoint was seen. ?We then turned our attention to the aortoiliac intervention.  9 French sheaths were placed in a retrograde fashion on both sides and control was created with Vesseloops around the sheaths.  We were then able to cross up and over from the right femoral artery to the left femoral artery without difficulty and then be ready to bring that up into the aorta.  A buddy wire system was used from the right side and advanced into the aorta and true lumen was identified.  The sheath was then advanced with a floppy Glidewire up from the left side and out the right femoral sheath until a Kumpe catheter was then passed in the aorta from the left side and found to be in the true lumen.  Advantage wire was then placed at both sides.  Longer 7 French sheaths were then placed for the lifestream stents and 7 mm diameter by 58 mm length lifestream stents were deployed from the distal aorta to the mid to distal common iliac arteries bilaterally in a kissing balloon fashion.  These were inflated to 12 atm.  Additional stents were required to treat the heavily diseased right iliac artery both in the distal common iliac artery and the external iliac artery down to the femoral head.  A 9 mm diameter by 8 cm length life star stent and then a 9 mm diameter by 5 cm length Viabahn stent were deployed down to the femoral head to encompass the right external iliac artery and the most distal common iliac artery preserving the hypogastric artery flow.  These were postdilated with 8 mm balloons as well as the proximal stent in the proximal common iliac artery.  On the left side, similarly, we tried to  preserve the left hypogastric artery with a 9 mm diameter by 6 cm length life star stent across the iliac bifurcation and then a 9 mm diameter by 7.5 cm length Viabahn stent in the mid and distal external iliac artery.  The left side was also postdilated with 8 mm balloons, but on completion imaging there was extravasation from the proximal left external iliac artery and a 9 mm diameter by 10 cm length covered stent was deployed bridging from the initial life stream stent down to the Viabahn stent in the external iliac artery.  This is postdilated with an 8 mm balloon.  Completion imaging showed both iliac arteries to be widely patent with less than 10% residual stenosis up to the distal aorta which was now patent with stents as well.  Both sheaths were then removed.  ? ?Attention was then turned to closure of the left femoral arteriotomy.  The Cormatrix extracellular patch was then brought onto the field.  It is cut and beveled and started at the proximal endpoint with a 6-0 Prolene suture.  Approximately one half of the suture line is run medially and laterally and the distal end point was cut and bevelled to match the arteriotomy.  A second 6-0  Prolene was started at the distal end point and run to the mid portion to complete the arteriotomy.   Flushing maneuvers were performed and flow was reestablished to the femoral vessels. Excellent pulses noted in the left superficial femoral and profunda femoris artery below the femoral anastomosis. ? ?After removal of the sheath on the right, the Cormatrix patcth is then selected and prepared for a patch angioplasty.  It is cut and beveled and started at the proximal endpoint with a 6-0 Prolene suture.  Approximately one half of the suture line is run medially and laterally and the distal end point was cut and bevelled to match the arteriotomy.  A second 6-0 Prolene was started at the distal end point and run to the mid portion to complete the arteriotomy.  The vessel was  flushed prior to release of control and completion of the anastomosis.  At this point, flow was established first to the profunda femoris artery and then to the superficial femoral artery. Easily palp

## 2021-09-16 NOTE — Anesthesia Preprocedure Evaluation (Addendum)
Anesthesia Evaluation  ?Patient identified by MRN, date of birth, ID band ?Patient awake ? ? ? ?Reviewed: ?Allergy & Precautions, NPO status , Patient's Chart, lab work & pertinent test results ? ?History of Anesthesia Complications ?(+) PONV and history of anesthetic complications ? ?Airway ?Mallampati: III ? ?TM Distance: >3 FB ?Neck ROM: full ? ? ? Dental ? ?(+) Chipped ?  ?Pulmonary ?shortness of breath and with exertion, asthma , COPD,  COPD inhaler, Current Smoker and Patient abstained from smoking.,  ?H/o Empyema of right pleural space in 2019 ?  ?Pulmonary exam normal ? ? ? ? ? ? ? Cardiovascular ?METS: 3 - Mets hypertension, + CAD, + Past MI (NSTEMI 2005), + Cardiac Stents (x1 2005), + Peripheral Vascular Disease (ASO LE), +CHF (HFpEF) and + DOE  ?Normal cardiovascular exam+ Valvular Problems/Murmurs (mod) MR  ? ?Lexi scan 09/14/2021: normal LV function with no ischemia or scar ? ?TTE 12/18/2020: EF >55%; mild LA enlargement; triv TR/PR, mod MR; G1DD ? ?ECG reveals sinus rhythm at 75 bpm ?  ?Neuro/Psych ?negative neurological ROS ? negative psych ROS  ? GI/Hepatic ?negative GI ROS, Neg liver ROS, neg GERD  ,  ?Endo/Other  ?negative endocrine ROS ? Renal/GU ?negative Renal ROS  ? ?  ?Musculoskeletal ?negative musculoskeletal ROS ?(+)  ? Abdominal ?Normal abdominal exam  (+)   ?Peds ? Hematology ?negative hematology ROS ?(+)   ?Anesthesia Other Findings ?Here for bilateral SFA endarterectomy, possible femorofemoral bypass and iliac stents. ? ?Past Medical History: ?06/07/2004: (HFpEF) heart failure with preserved ejection fraction  ?(HCC) ?    Comment:  a.) TTE 06/07/2004: EF 50-55%; inf HK. b.) TTE  ?             04/17/2018: EF 45%; GLS -14.6%; inferoseptal and  ?             inferoposterior HK; sev LA enlargement; mild PR, mod TR,  ?             sev MR; G2DD. c.) TTE 12/05/2018: EF >55%, mild LVH; mild ?             MR. d.) TTE 12/18/2020: EF >55%; mild LA enlargement;  ?              triv TR/PR, mod MR; G1DD. ?No date: Anxiety ?No date: Aortic atherosclerosis ([place]) ?No date: Asthma ?06/08/2004: CAD (coronary artery disease) ?    Comment:  a.) NSTEMI 06/05/2004. b.) LHC 06/08/2004: EF 70%; 50%  ?             mLAD, 50% dLAD, 99% OM3, 50% pRCA, 50% mRCA; PCI to OM3  ?             placing a DES x 1 (unknown type). c.) Lexi scan  ?             09/14/2021: normal LV function with no ischemia or scar. ?No date: COPD (chronic obstructive pulmonary disease) ([place]) ?No date: Depression ?No date: Diverticulosis ?No date: DOE (dyspnea on exertion) ?02/16/2018: Empyema of right pleural space ([place]) ?No date: HLD (hyperlipidemia) ?No date: Hypertension ?No date: IDA (iron deficiency anemia) ?No date: Ischemic cardiomyopathy ?06/05/2004: NSTEMI (non-ST elevated myocardial infarction) ([place]) ?    Comment:  a.)  PCI on 06/08/2004 placing a DES x1 (unknown type)  ?             to 99% lesion in OM3. ?No date: Osteoporosis ?No date: Peripheral vascular disease ([place]) ?No date: PONV (postoperative nausea and vomiting) ? ?  Past Surgical History: ?04/12/2017: COLONOSCOPY WITH PROPOFOL; N/A ?    Comment:  Procedure: COLONOSCOPY WITH PROPOFOL;  Surgeon:  ?             Lollie Sails, MD;  Location: [facility] ENDOSCOPY;   ?             Service: Endoscopy;  Laterality: N/A; ?06/08/2004: CORONARY ANGIOPLASTY WITH STENT PLACEMENT; Left ?    Comment:  Procedure: CORONARY ANGIOPLASTY WITH STENT PLACEMENT;  ?             Location: [place]; Surgeon: Katrine Coho, MD ?11/28/2017: ENDOBRONCHIAL ULTRASOUND; N/A ?    Comment:  Procedure: ENDOBRONCHIAL ULTRASOUND;  Surgeon:  ?             Laverle Hobby, MD;  Location: [facility] ORS;  Service: ?             Pulmonary;  Laterality: N/A; ?08/12/2021: LOWER EXTREMITY ANGIOGRAPHY; Right ?    Comment:  Procedure: Lower Extremity Angiography;  Surgeon: Lucky Cowboy,  ?             Erskine Squibb, MD;  Location: [place] [facility];  Service:  ?             Cardiovascular;  Laterality: Right; ?No  date: PARTIAL HYSTERECTOMY; N/A ?No date: REDUCTION MAMMAPLASTY ?03/2018: VIDEO ASSISTED THORACOSCOPY (VATS)/THOROCOTOMY; Right ?    Comment:  Procedure: VIDEO ASSISTED THORACOSCOPY  ?             (VATS)/THOROCOTOMY (partial decortication, loculated  ?             empyema); Location: [facility] ? ? ? ? Reproductive/Obstetrics ?negative OB ROS ? ?  ? ? ? ? ? ? ? ? ? ? ? ? ? ?  ?  ? ? ? ? ? ?Anesthesia Physical ?Anesthesia Plan ? ?ASA: 3 ? ?Anesthesia Plan: General ETT  ? ?Post-op Pain Management: Tylenol PO (pre-op)* and Precedex  ? ?Induction: Intravenous ? ?PONV Risk Score and Plan: Ondansetron, Dexamethasone, Midazolam, Treatment may vary due to age or medical condition and Aprepitant ? ?Airway Management Planned: Oral ETT ? ?Additional Equipment: Arterial line ? ?Intra-op Plan:  ? ?Post-operative Plan: Extubation in OR ? ?Informed Consent: I have reviewed the patients History and Physical, chart, labs and discussed the procedure including the risks, benefits and alternatives for the proposed anesthesia with the patient or authorized representative who has indicated his/her understanding and acceptance.  ? ? ? ?Dental advisory given ? ?Plan Discussed with: Anesthesiologist, CRNA and Surgeon ? ?Anesthesia Plan Comments:   ? ? ? ? ?Anesthesia Quick Evaluation ? ?

## 2021-09-17 ENCOUNTER — Encounter: Payer: Self-pay | Admitting: Vascular Surgery

## 2021-09-17 LAB — BPAM RBC
Blood Product Expiration Date: 202304022359
Blood Product Expiration Date: 202304112359
Blood Product Expiration Date: 202304112359
ISSUE DATE / TIME: 202303221417
ISSUE DATE / TIME: 202303221417
ISSUE DATE / TIME: 202303221551
Unit Type and Rh: 600
Unit Type and Rh: 600
Unit Type and Rh: 600

## 2021-09-17 LAB — TYPE AND SCREEN
ABO/RH(D): A NEG
Antibody Screen: NEGATIVE
Unit division: 0
Unit division: 0
Unit division: 0

## 2021-09-17 LAB — BASIC METABOLIC PANEL
Anion gap: 7 (ref 5–15)
BUN: 23 mg/dL (ref 8–23)
CO2: 19 mmol/L — ABNORMAL LOW (ref 22–32)
Calcium: 8.6 mg/dL — ABNORMAL LOW (ref 8.9–10.3)
Chloride: 111 mmol/L (ref 98–111)
Creatinine, Ser: 1.13 mg/dL — ABNORMAL HIGH (ref 0.44–1.00)
GFR, Estimated: 52 mL/min — ABNORMAL LOW (ref >60)
Glucose, Bld: 192 mg/dL — ABNORMAL HIGH (ref 70–99)
Potassium: 5.5 mmol/L — ABNORMAL HIGH (ref 3.5–5.1)
Sodium: 137 mmol/L (ref 135–145)

## 2021-09-17 LAB — CBC
HCT: 33.6 % — ABNORMAL LOW (ref 36.0–46.0)
Hemoglobin: 11.1 g/dL — ABNORMAL LOW (ref 12.0–15.0)
MCH: 29.8 pg (ref 26.0–34.0)
MCHC: 33 g/dL (ref 30.0–36.0)
MCV: 90.1 fL (ref 80.0–100.0)
Platelets: 136 10*3/uL — ABNORMAL LOW (ref 150–400)
RBC: 3.73 MIL/uL — ABNORMAL LOW (ref 3.87–5.11)
RDW: 14.7 % (ref 11.5–15.5)
WBC: 8.9 10*3/uL (ref 4.0–10.5)
nRBC: 0 % (ref 0.0–0.2)

## 2021-09-17 LAB — PREPARE RBC (CROSSMATCH)

## 2021-09-17 MED ORDER — FAMOTIDINE 20 MG PO TABS
20.0000 mg | ORAL_TABLET | Freq: Two times a day (BID) | ORAL | Status: DC
  Administered 2021-09-17 – 2021-09-18 (×3): 20 mg via ORAL
  Filled 2021-09-17 (×3): qty 1

## 2021-09-17 MED ORDER — SODIUM CHLORIDE 0.9 % IV BOLUS
500.0000 mL | Freq: Once | INTRAVENOUS | Status: AC
  Administered 2021-09-17: 500 mL via INTRAVENOUS

## 2021-09-17 NOTE — Progress Notes (Signed)
Pt awake and in pain overnight requiring multiple doses of her pain medication. Remains alert and oriented, complains of severe pain down both legs and her groin sites. Minimal relief with morphine and norco.  Continue to monitor. ?

## 2021-09-17 NOTE — Anesthesia Postprocedure Evaluation (Signed)
Anesthesia Post Note ? ?Patient: Wendy Arias ? ?Procedure(s) Performed: ENDARTERECTOMY FEMORAL (Bilateral) ?INSERTION OF ILIAC STENT (Bilateral) ?APPLICATION OF CELL SAVER ? ?Patient location during evaluation: ICU ?Anesthesia Type: General ?Level of consciousness: awake and alert, oriented and patient cooperative ?Pain management: pain level not controlled ?Vital Signs Assessment: post-procedure vital signs reviewed and stable ?Respiratory status: spontaneous breathing, nonlabored ventilation and respiratory function stable ?Cardiovascular status: blood pressure returned to baseline and stable ?Postop Assessment: adequate PO intake ?Anesthetic complications: no ? ? ?No notable events documented. ? ? ?Last Vitals:  ?Vitals:  ? 09/17/21 0500 09/17/21 0600  ?BP: 105/76 105/70  ?Pulse: 74 70  ?Resp: (!) 27 10  ?Temp:    ?SpO2: 93% 93%  ?  ?Last Pain:  ?Vitals:  ? 09/17/21 0324  ?TempSrc:   ?PainSc: Asleep  ? ? ?  ?  ?  ?  ?  ?  ? ?Darrin Nipper ? ? ? ? ?

## 2021-09-17 NOTE — TOC Initial Note (Signed)
Transition of Care (TOC) - Initial/Assessment Note  ? ? ?Patient Details  ?Name: Wendy Arias ?MRN: [id number] ?Date of Birth: [birth year] ? ?Transition of Care (TOC) CM/SW Contact:    ?Shelbie Hutching, RN ?Phone Number: ?09/17/2021, 11:34 AM ? ?Clinical Narrative:                 ?Patient admitted to the hospital with atherosclerosis of artery bilateral lower extremities, s/p endarterectomy and stent placement.  RNCM met with patient and patient's sister, [name] at the bedside, introduced self and explained role. ?Patient is from home where she lives alone and is independent.   ?Patient reports that she has all needed DME, walker, rollator, bedside commode.  She adamantly declines home health services, she reports that she does not need it and we should give home health to those that really do need it.   ?Patient's granddaughter will be staying with her once she is discharged from the hospital and she will help take care of the patient at home along with her sister.  Sister Loletha Carrow will provide transportation home at discharge.  ? ?Expected Discharge Plan: Home/Self Care ?Barriers to Discharge: Continued Medical Work up ? ? ?Patient Goals and CMS Choice ?Patient states their goals for this hospitalization and ongoing recovery are:: patient wants to go home where granddaughter and sister will help take care of her ?  ?  ? ?Expected Discharge Plan and Services ?Expected Discharge Plan: Home/Self Care ?  ?Discharge Planning Services: CM Consult ?  ?Living arrangements for the past 2 months: [place] ?                ?DME Arranged: N/A ?DME Agency: NA ?  ?  ?  ?HH Arranged: Patient Refused HH ?[place] Agency: NA ?  ?  ?  ? ?Prior Living Arrangements/Services ?Living arrangements for the past 2 months: [place] ?Lives with:: Self ?Patient language and need for interpreter reviewed:: Yes ?Do you feel safe going back to the place where you live?: Yes      ?Need for Family Participation in Patient Care: Yes  (Comment) ?Care giver support system in place?: Yes (comment) (sister and granddaughter) ?Current home services: DME ?Criminal Activity/Legal Involvement Pertinent to Current Situation/Hospitalization: No - Comment as needed ? ?Activities of Daily Living ?  ?  ? ?Permission Sought/Granted ?Permission sought to share information with : Case Manager, Family Supports ?Permission granted to share information with : Yes, Verbal Permission Granted ? Share Information with NAME: Colletta [place] ?   ? Permission granted to share info w Relationship: sister ? Permission granted to share info w Contact Information: [phone] ? ?Emotional Assessment ?Appearance:: Appears stated age ?Attitude/Demeanor/Rapport: Engaged ?Affect (typically observed): Accepting ?Orientation: : Oriented to Self, Oriented to Place, Oriented to  Time, Oriented to Situation ?Alcohol / Substance Use: Not Applicable ?Psych Involvement: No (comment) ? ?Admission diagnosis:  Atherosclerosis of artery of extremity with rest pain ([place]) [I70.229] ?Patient Active Problem List  ? Diagnosis Date Noted  ? Atherosclerosis of artery of extremity with rest pain ([place]) 09/16/2021  ? Atherosclerosis of native arteries of extremity with rest pain ([place]) 08/07/2021  ? COPD exacerbation ([place]) 05/26/2021  ? Elevated serum alkaline phosphatase level 08/10/2018  ? Chronic diastolic CHF (congestive heart failure), NYHA class 3 ([place]) 08/09/2018  ? History of empyema of pleura 08/09/2018  ? Chronic insomnia 08/09/2018  ? Itching 08/09/2018  ? Chronic cough 08/09/2018  ? Coronary artery disease involving native coronary artery of native  heart without angina pectoris 05/08/2018  ? At risk for fluid volume overload 04/18/2018  ? Chronic pain 04/18/2018  ? Ischemic cardiomyopathy 04/18/2018  ? Mitral regurgitation 04/18/2018  ? Aspiration into respiratory tract 04/17/2018  ? Anxiety and depression 04/17/2018  ? Dysphagia 04/17/2018  ? Dyspnea on exertion 04/17/2018  ? IDA (iron  deficiency anemia) 04/17/2018  ? Infection by Streptococcus, viridans group 04/17/2018  ? Abnormal pleural fluid   ? Hepatic cirrhosis ([place])   ? Anemia of chronic disease   ? Empyema of right pleural space (H[facility])   ? Pneumonia 04/09/2018  ? Postmenopausal 03/29/2018  ? Gastroesophageal reflux disease without esophagitis 03/01/2018  ? Pure hypercholesterolemia 03/01/2018  ? Abscess of middle lobe of right lung with pneumonia ([place]) 02/16/2018  ? Essential hypertension 12/16/2017  ? Other nonspecific abnormal finding of lung field 12/16/2017  ? Mass of middle lobe of right lung 11/22/2017  ? Mass of lower lobe of right lung 11/22/2017  ? Mediastinal adenopathy 11/22/2017  ? Acute encephalopathy 11/03/2017  ? Benzodiazepine withdrawal ([place]) 11/03/2017  ? H/O diarrhea 06/14/2017  ? ?PCP:  Donnamarie Rossetti, PA-C ?Pharmacy:   ?[facility] - [place], [place] - [place] ?[address] ?[place] [address] ?Phone: [phone] Fax: [phone] ? ?[place], [place] ?[address] ?[place] [address] ?Phone: [phone] Fax: [phone] ? ? ? ? ?Social Determinants of Health (SDOH) Interventions ?  ? ?Readmission Risk Interventions ?   ? View : No data to display.  ?  ?  ?  ? ? ? ?

## 2021-09-17 NOTE — Progress Notes (Signed)
[facility] ? ?Daily Progress Note ? ? ?Subjective  -  ? ?Doing fairly well. Pain control is fair but still requiring morphine ? ?Objective ?Vitals:  ? 09/17/21 0600 09/17/21 0700 09/17/21 0730 09/17/21 0800  ?BP: 105/70 111/64  108/65  ?Pulse: 70 72  73  ?Resp: '10 17  15  '$ ?Temp:   98 ?F (36.7 ?C)   ?TempSrc:   Oral   ?SpO2: 93% 93%  95%  ?Weight:      ?Height:      ? ? ?Intake/Output Summary (Last 24 hours) at 09/17/2021 1201 ?Last data filed at 09/17/2021 0730 ?Gross per 24 hour  ?Intake 4189.84 ml  ?Output 1185 ml  ?Net 3004.84 ml  ? ? ?PULM  CTAB ?CV  RRR ?VASC  Feet warm, 1+ pedal pulses ? ?Laboratory ?CBC ?   ?Component Value Date/Time  ? WBC 8.9 09/17/2021 0004  ? HGB 11.1 (L) 09/17/2021 0004  ? HGB 13.6 10/31/2013 1541  ? HCT 33.6 (L) 09/17/2021 0004  ? HCT 41.4 10/31/2013 1541  ? PLT 136 (L) 09/17/2021 0004  ? PLT 272 10/31/2013 1541  ? ? ?BMET ?   ?Component Value Date/Time  ? NA 137 09/17/2021 0004  ? NA 138 10/31/2013 1541  ? K 5.5 (H) 09/17/2021 0004  ? K 4.5 10/31/2013 1541  ? CL 111 09/17/2021 0004  ? CL 101 10/31/2013 1541  ? CO2 19 (L) 09/17/2021 0004  ? CO2 29 10/31/2013 1541  ? GLUCOSE 192 (H) 09/17/2021 0004  ? GLUCOSE 96 10/31/2013 1541  ? BUN 23 09/17/2021 0004  ? BUN 10 10/31/2013 1541  ? CREATININE 1.13 (H) 09/17/2021 0004  ? CREATININE 0.91 10/31/2013 1541  ? CALCIUM 8.6 (L) 09/17/2021 0004  ? CALCIUM 9.1 10/31/2013 1541  ? GFRNONAA 52 (L) 09/17/2021 0004  ? GFRNONAA >60 10/31/2013 1541  ? GFRAA >60 04/13/2018 1030  ? GFRAA >60 10/31/2013 1541  ? ? ?Assessment/Planning: ?POD #1 s/p bilateral femoral endarterectomies, bilateral iliac stent placement ? ?Doing well ?No major events ?Step down status ?PT, increase activity.   ?Possible D/C tomorrow ? ? ?[name] ? ?09/17/2021, 12:01 PM ? ? ? ?  ?

## 2021-09-17 NOTE — Progress Notes (Signed)
Physical Therapy Treatment ?Patient Details ?Name: Wendy Arias ?MRN: [id number] ?DOB: [birth year] ?Today's Date: 09/17/2021 ? ? ?History of Present Illness Patient is a 71 yo F with PMH: HFpEF, Anxiety, COPD, depression, HTN, PVD. Pt presents 09/16/21 for surgical revascularization with hybrid procedure for her severe bilateral lower extremity ischemia. ? ?  ?PT Comments  ? ? Pt is making gradual progress towards goals with heavy encouragement to participate. Pt able to ambulate limited distance in room and refuses further mobility attempts. Agreeable to supine there-ex. Heavily educated on benefits of therapy. Will continue to progress as able.  ?Recommendations for follow up therapy are one component of a multi-disciplinary discharge planning process, led by the attending physician.  Recommendations may be updated based on patient status, additional functional criteria and insurance authorization. ? ?Follow Up Recommendations ? Home health PT (pt currently refusing) ?  ?  ?Assistance Recommended at Discharge Frequent or constant Supervision/Assistance  ?Patient can return home with the following A little help with walking and/or transfers;A little help with bathing/dressing/bathroom;Help with stairs or ramp for entrance ?  ?Equipment Recommendations ? None recommended by PT  ?  ?Recommendations for Other Services   ? ? ?  ?Precautions / Restrictions Precautions ?Precautions: Fall ?Restrictions ?Weight Bearing Restrictions: No  ?  ? ?Mobility ? Bed Mobility ?Overal bed mobility: Needs Assistance ?Bed Mobility: Supine to Sit, Sit to Supine ?  ?  ?Supine to sit: Min assist ?Sit to supine: Min assist ?  ?General bed mobility comments: needs assist with B LE management. Heavy pain with all mobility ?  ? ?Transfers ?Overall transfer level: Needs assistance ?Equipment used: Rolling walker (2 wheels) ?Transfers: Sit to/from Stand, Bed to chair/wheelchair/BSC ?Sit to Stand: Min assist ?  ?Step pivot transfers: Min assist ?  ?   ?  ?General transfer comment: cues for RW use and hand placement. FOrward flexed posture on RW due to cramping. ?  ? ?Ambulation/Gait ?Ambulation/Gait assistance: Min assist ?Gait Distance (Feet): 15 Feet ?Assistive device: Rolling walker (2 wheels) ?Gait Pattern/deviations: Step-to pattern ?  ?  ?  ?General Gait Details: ambulated limited distance in room with RW, declined further distance due to pain. Heavy encouragement to participate ? ? ?Stairs ?  ?  ?  ?  ?  ? ? ?Wheelchair Mobility ?  ? ?Modified Rankin (Stroke Patients Only) ?  ? ? ?  ?Balance Overall balance assessment: Needs assistance ?Sitting-balance support: Bilateral upper extremity supported, Feet supported ?Sitting balance-Leahy Scale: Fair ?  ?  ?Standing balance support: Bilateral upper extremity supported ?Standing balance-Leahy Scale: Fair ?  ?  ?  ?  ?  ?  ?  ?  ?  ?  ?  ?  ?  ? ?  ?Cognition Arousal/Alertness: Awake/alert ?Behavior During Therapy: Anxious ?Overall Cognitive Status: Within Functional Limits for tasks assessed ?  ?  ?  ?  ?  ?  ?  ?  ?  ?  ?  ?  ?  ?  ?  ?  ?General Comments: Pt very anxious throughout, pain limited ?  ?  ? ?  ?Exercises Other Exercises ?Other Exercises: attempted B LE including AP, QS and SLR. 10 reps with severe pain, unable to further participate. ?Other Exercises: During ther-ex pt developed severe R LE cramp. Massage and ROM given and performed, unable to relieve. Pt then perform sup->stand transfer and held onto bed to [facility] through R LE. Left with Pt standing and family member in room, RN aware. Pt  declined further assistance from therapist. ? ?  ?General Comments   ?  ?  ? ?Pertinent Vitals/Pain Pain Assessment ?Pain Assessment: 0-10 ?Pain Score: 10-Worst pain ever ?Pain Location: B groins/thighs ?Pain Descriptors / Indicators: Burning, Cramping ?Pain Intervention(s): Limited activity within patient's tolerance, Premedicated before session, Repositioned  ? ? ?Home Living Family/patient expects to be  discharged to:: Private residence ?Living Arrangements: Children ?Available Help at Discharge: Family;Available PRN/intermittently ?Type of Home: House ?Home Access: Level entry ?  ?  ?  ?Home Layout: One level ?Home Equipment: Rolling Walker (2 wheels);BSC/3in1;Wheelchair - manual ?Additional Comments: does not use AD at baseline  ?  ?Prior Function    ?  ?  ?   ? ?PT Goals (current goals can now be found in the care plan section) Acute Rehab PT Goals ?Patient Stated Goal: to go home ?PT Goal Formulation: With patient ?Time For Goal Achievement: 10/01/21 ?Potential to Achieve Goals: Good ? ?  ?Frequency ? ? ? Min 2X/week ? ? ? ?  ?PT Plan    ? ? ?Co-evaluation   ?  ?  ?  ?  ? ?  ?AM-PAC PT "6 Clicks" Mobility   ?Outcome Measure ? Help needed turning from your back to your side while in a flat bed without using bedrails?: A Little ?Help needed moving from lying on your back to sitting on the side of a flat bed without using bedrails?: A Little ?Help needed moving to and from a bed to a chair (including a wheelchair)?: A Little ?Help needed standing up from a chair using your arms (e.g., wheelchair or bedside chair)?: A Little ?Help needed to walk in hospital room?: A Little ?Help needed climbing 3-5 steps with a railing? : A Lot ?6 Click Score: 17 ? ?  ?End of Session Equipment Utilized During Treatment: Gait belt ?Activity Tolerance: Patient limited by pain ?Patient left:  (left standing in room with family member, RN entering room) ?Nurse Communication: Mobility status ?PT Visit Diagnosis: Unsteadiness on feet (R26.81);Muscle weakness (generalized) (M62.81);Difficulty in walking, not elsewhere classified (R26.2);Pain ?Pain - Right/Left:  (bilat) ?Pain - part of body: Leg ?  ? ? ?Time: 6606-3016 ?PT Time Calculation (min) (ACUTE ONLY): 17 min ? ?Charges:  $Therapeutic Exercise: 8-22 mins          ?          ? ?Greggory Stallion, PT, DPT, GCS ?[phone] ? ? ? ?Uldine Fuster ?09/17/2021, 4:35 PM ? ?

## 2021-09-17 NOTE — Progress Notes (Signed)
PHARMACIST - PHYSICIAN COMMUNICATION ? ?CONCERNING: IV to Oral Route Change Policy ? ?RECOMMENDATION: ?This patient is receiving famotidine by the intravenous route.  Based on criteria approved by the Pharmacy and Therapeutics Committee, the intravenous medication(s) is/are being converted to the equivalent oral dose form(s). ? ? ?DESCRIPTION: ?These criteria include: ?The patient is eating (either orally or via tube) and/or has been taking other orally administered medications for a least 24 hours ?The patient has no evidence of active gastrointestinal bleeding or impaired GI absorption (gastrectomy, short bowel, patient on TNA or NPO). ? ?If you have questions about this conversion, please contact the Pharmacy Department  ? ?Benita Gutter, RPH ?09/17/2021 9:02 AM  ?

## 2021-09-17 NOTE — Progress Notes (Signed)
?   09/17/21 1030  ?Clinical Encounter Type  ?Visited With Patient and family together  ?Visit Type Initial  ? ?Chaplain facilitated signing of Advance Directive. ?

## 2021-09-17 NOTE — Evaluation (Signed)
Occupational Therapy Evaluation ?Patient Details ?Name: GENEIEVE DUELL ?MRN: [id number] ?DOB: [birth year] ?Today's Date: 09/17/2021 ? ? ?History of Present Illness Patient is a 71 yo F with PMH: HFpEF, Anxiety, COPD, depression, HTN, PVD. Pt presents 09/16/21 for surgical revascularization with hybrid procedure for her severe bilateral lower extremity ischemia.  ? ?Clinical Impression ?  ?Ms Cales was seen for OT evaluation this date. Prior to hospital admission, pt was Independent for mobility and ADLs however reports using w/c last weeks 2/2 BLE pain. Pt lives with son in 1 level condo with level entry. Pt presents to acute OT demonstrating impaired ADL performance and functional mobility 2/2 decreased activity tolerance and functional strength/ROM/balance deficits. Pt currently requires MOD A exit L side of bed - increased assist t/o session 2/2 pt reporting 10/10 pain and anxiousness, RN in for pain meds during session. MOD A don B shoes seated EOB. SBA for UBD seated EOB. MIN A +2 for bed>chair, +2 for lines/leads and encouragement 2/2 pain. Pt reports no increase to pain standing, remains same burning sensation t/o session. Pt would benefit from skilled OT to address noted impairments and functional limitations (see below for any additional details). Upon hospital discharge, recommend HHOT to maximize pt safety and return to PLOF. ?   ? ?Recommendations for follow up therapy are one component of a multi-disciplinary discharge planning process, led by the attending physician.  Recommendations may be updated based on patient status, additional functional criteria and insurance authorization.  ? ?Follow Up Recommendations ? Home health OT  ?  ?Assistance Recommended at Discharge Intermittent Supervision/Assistance  ?Patient can return home with the following A little help with walking and/or transfers;A little help with bathing/dressing/bathroom ? ?  ?Functional Status Assessment ? Patient has had a recent decline in  their functional status and demonstrates the ability to make significant improvements in function in a reasonable and predictable amount of time.  ?Equipment Recommendations ? Other (comment) (2WW\)  ?  ?Recommendations for Other Services   ? ? ?  ?Precautions / Restrictions Precautions ?Precautions: Fall ?Restrictions ?Weight Bearing Restrictions: No  ? ?  ? ?Mobility Bed Mobility ?Overal bed mobility: Needs Assistance ?Bed Mobility: Supine to Sit ?  ?  ?Supine to sit: Mod assist, HOB elevated ?  ?  ?General bed mobility comments: assist for BLE mgmt 2/2 pain and anxiousness ?  ? ?Transfers ?Overall transfer level: Needs assistance ?Equipment used: 2 person hand held assist ?Transfers: Sit to/from Stand, Bed to chair/wheelchair/BSC ?Sit to Stand: Min assist, +2 safety/equipment ?  ?  ?Step pivot transfers: Min assist, +2 safety/equipment ?  ?  ?  ?  ? ?  ?Balance Overall balance assessment: Needs assistance ?Sitting-balance support: Bilateral upper extremity supported, Feet supported ?Sitting balance-Leahy Scale: Fair ?  ?  ?Standing balance support: Bilateral upper extremity supported ?Standing balance-Leahy Scale: Fair ?  ?  ?  ?  ?  ?  ?  ?  ?  ?  ?  ?  ?   ? ?ADL either performed or assessed with clinical judgement  ? ?ADL Overall ADL's : Needs assistance/impaired ?  ?  ?  ?  ?  ?  ?  ?  ?  ?  ?  ?  ?  ?  ?  ?  ?  ?  ?  ?General ADL Comments: MOD A don shoes seated EOB. SBA for UBD seated EOB. MIN A +2 for simulated BSC t/f - assist for encouragement and lines mgmt 2/2 pain/anxiousness.  ? ? ? ? ?  Pertinent Vitals/Pain Pain Assessment ?Pain Assessment: 0-10 ?Pain Score: 10-Worst pain ever ?Pain Location: B groins/thighs ?Pain Descriptors / Indicators: Burning ?Pain Intervention(s): Limited activity within patient's tolerance, Repositioned, Premedicated before session, RN gave pain meds during session  ? ? ? ?Hand Dominance   ?  ?Extremity/Trunk Assessment Upper Extremity Assessment ?Upper Extremity Assessment:  Overall WFL for tasks assessed ?  ?Lower Extremity Assessment ?Lower Extremity Assessment: Generalized weakness ?  ?  ?  ?Communication Communication ?Communication: No difficulties ?  ?Cognition Arousal/Alertness: Awake/alert ?Behavior During Therapy: Anxious ?Overall Cognitive Status: Within Functional Limits for tasks assessed ?  ?  ?  ?  ?  ?  ?  ?  ?  ?  ?  ?  ?  ?  ?  ?  ?  ?  ?  ? ?Home Living Family/patient expects to be discharged to:: Private residence ?Living Arrangements: Children ?Available Help at Discharge: Family;Available PRN/intermittently ?Type of Home: House ?Home Access: Level entry ?  ?  ?Home Layout: One level ?  ?  ?  ?  ?  ?  ?  ?Home Equipment: Rolling Walker (2 wheels);BSC/3in1;Wheelchair - manual ?  ?  ?  ? ?  ?Prior Functioning/Environment Prior Level of Function : Independent/Modified Independent ?  ?  ?  ?  ?  ?  ?Mobility Comments: no AD baseline however w/c use 2/2 pain recent weeks ?ADLs Comments: ind baseline ?  ? ?  ?  ?OT Problem List: Decreased range of motion;Decreased activity tolerance;Impaired balance (sitting and/or standing);Pain;Decreased safety awareness ?  ?   ?OT Treatment/Interventions: Self-care/ADL training;Therapeutic exercise;Energy conservation;DME and/or AE instruction;Therapeutic activities;Patient/family education;Balance training  ?  ?OT Goals(Current goals can be found in the care plan section) Acute Rehab OT Goals ?Patient Stated Goal: to go home ?OT Goal Formulation: With patient/family ?Time For Goal Achievement: 10/01/21 ?Potential to Achieve Goals: Good ?ADL Goals ?Pt Will Perform Grooming: with supervision;standing ?Pt Will Perform Lower Body Dressing: with modified independence;sit to/from stand ?Pt Will Transfer to Toilet: with modified independence;ambulating;regular height toilet  ?OT Frequency: Min 2X/week ?  ? ?Co-evaluation   ?  ?  ?  ?  ? ?  ?AM-PAC OT "6 Clicks" Daily Activity     ?Outcome Measure Help from another person eating meals?:  None ?Help from another person taking care of personal grooming?: A Little ?Help from another person toileting, which includes using toliet, bedpan, or urinal?: A Little ?Help from another person bathing (including washing, rinsing, drying)?: A Little ?Help from another person to put on and taking off regular upper body clothing?: A Little ?Help from another person to put on and taking off regular lower body clothing?: A Lot ?6 Click Score: 18 ?  ?End of Session Nurse Communication: Mobility status ? ?Activity Tolerance: Patient limited by pain ?Patient left: in chair;with call bell/phone within reach;with nursing/sitter in room;with family/visitor present ? ?OT Visit Diagnosis: Other abnormalities of gait and mobility (R26.89)  ?              ?Time: 0174-9449 ?OT Time Calculation (min): 22 min ?Charges:  OT General Charges ?$OT Visit: 1 Visit ?OT Evaluation ?$OT Eval Low Complexity: 1 Low ?OT Treatments ?$Self Care/Home Management : 8-22 mins ? ?Dessie Coma, M.S. OTR/L  ?09/17/21, 9:49 AM  ?ascom [phone] ? ?

## 2021-09-18 DIAGNOSIS — I70229 Atherosclerosis of native arteries of extremities with rest pain, unspecified extremity: Secondary | ICD-10-CM

## 2021-09-18 DIAGNOSIS — Z48812 Encounter for surgical aftercare following surgery on the circulatory system: Secondary | ICD-10-CM

## 2021-09-18 DIAGNOSIS — I70223 Atherosclerosis of native arteries of extremities with rest pain, bilateral legs: Principal | ICD-10-CM

## 2021-09-18 LAB — SURGICAL PATHOLOGY

## 2021-09-18 MED ORDER — OXYCODONE-ACETAMINOPHEN 5-325 MG PO TABS: 1.0000 | ORAL_TABLET | ORAL | 0 refills | Status: DC | PRN

## 2021-09-18 MED ORDER — CLOPIDOGREL BISULFATE 75 MG PO TABS: 75.0000 mg | ORAL_TABLET | Freq: Every day | ORAL | 6 refills | Status: DC

## 2021-09-18 NOTE — Progress Notes (Signed)
Occupational Therapy Treatment ?Patient Details ?Name: Wendy Arias ?MRN: [id number] ?DOB: 26-May-1951 ?Today's Date: 09/18/2021 ? ? ?History of present illness Patient is a 71 yo F with PMH: HFpEF, Anxiety, COPD, depression, HTN, PVD. Pt presents 09/16/21 for surgical revascularization with hybrid procedure for her severe bilateral lower extremity ischemia. ?  ?OT comments ? Wendy Arias was seen for OT treatment on this date. Upon arrival to room pt reclined in bed, agreeable to tx. Pt requires increased time and bed rail use to exit bed with encouragement given. SUPERVISION don B shoes (crocs) sitting EOB, unable to flex at waist for LB access to don socks. Initial MIN A + HHA sit<>stand and in room functional mobility improving to SBA with RW use, tolerates ~15 ft. Discussed DME recs, home/routines modifications, and importance of mobility for pain control/functional strengthening. Pt making good progress toward goals. Pt continues to benefit from skilled OT services to maximize return to PLOF and minimize risk of future falls, injury, caregiver burden, and readmission. Will continue to follow POC. Discharge recommendation remains appropriate.  ?  ? ?Recommendations for follow up therapy are one component of a multi-disciplinary discharge planning process, led by the attending physician.  Recommendations may be updated based on patient status, additional functional criteria and insurance authorization. ?   ?Follow Up Recommendations ? Home health OT  ?  ?Assistance Recommended at Discharge Intermittent Supervision/Assistance  ?Patient can return home with the following ? A little help with walking and/or transfers;A little help with bathing/dressing/bathroom ?  ?Equipment Recommendations ? BSC/3in1;Other (comment) (2WW)  ?  ?Recommendations for Other Services   ? ?  ?Precautions / Restrictions Precautions ?Precautions: Fall ?Restrictions ?Weight Bearing Restrictions: No  ? ? ?  ? ?Mobility Bed Mobility ?Overal bed  mobility: Needs Assistance ?Bed Mobility: Supine to Sit ?  ?  ?Supine to sit: Min guard, HOB elevated ?  ?  ?  ?  ? ?Transfers ?Overall transfer level: Needs assistance ?Equipment used: 1 person hand held assist ?Transfers: Sit to/from Stand ?Sit to Stand: Min assist ?  ?  ?  ?  ?  ?General transfer comment: improves to SBA with RW use ?  ?  ?Balance Overall balance assessment: Needs assistance ?Sitting-balance support: Bilateral upper extremity supported, Feet supported ?Sitting balance-Leahy Scale: Fair ?  ?  ?Standing balance support: Single extremity supported, During functional activity ?Standing balance-Leahy Scale: Fair ?  ?  ?  ?  ?  ?  ?  ?  ?  ?  ?  ?  ?   ? ?ADL either performed or assessed with clinical judgement  ? ?ADL Overall ADL's : Needs assistance/impaired ?  ?  ?  ?  ?  ?  ?  ?  ?  ?  ?  ?  ?  ?  ?  ?  ?  ?  ?  ?General ADL Comments: SUPERVISION don B shoes (crocs) sitting EOB, unable to flex at waist for LB access. Initial MIN A + HHA for functional mobility improving to SBA with RW use. ?  ? ?Extremity/Trunk Assessment Upper Extremity Assessment ?Upper Extremity Assessment: Overall WFL for tasks assessed ?  ?Lower Extremity Assessment ?Lower Extremity Assessment: Generalized weakness ?  ?  ?  ? ? ?Cognition Arousal/Alertness: Awake/alert ?Behavior During Therapy: Anxious ?Overall Cognitive Status: Within Functional Limits for tasks assessed ?  ?  ?  ?  ?  ?  ?  ?  ?  ?  ?  ?  ?  ?  ?  ?  ?  ?  ?  ?   ?   ?   ?   ? ? ?  Pertinent Vitals/ Pain       Pain Assessment ?Pain Assessment: 0-10 ?Pain Score: 6  ?Pain Location: B groins/thighs ?Pain Descriptors / Indicators: Burning, Cramping ?Pain Intervention(s): Limited activity within patient's tolerance, Repositioned, Premedicated before session ? ?   ?   ? ?Frequency ? Min 2X/week  ? ? ? ? ?  ?Progress Toward Goals ? ?OT Goals(current goals can now be found in the care plan section) ? Progress towards OT goals: Progressing toward goals ? ?Acute Rehab  OT Goals ?Patient Stated Goal: to go home ?OT Goal Formulation: With patient/family ?Time For Goal Achievement: 10/01/21 ?Potential to Achieve Goals: Good ?ADL Goals ?Pt Will Perform Grooming: with supervision;standing ?Pt Will Perform Lower Body Dressing: with modified independence;sit to/from stand ?Pt Will Transfer to Toilet: with modified independence;ambulating;regular height toilet  ?Plan Discharge plan remains appropriate;Frequency remains appropriate   ? ?Co-evaluation ? ? ?   ?  ?  ?  ?  ? ?  ?AM-PAC OT "6 Clicks" Daily Activity     ?Outcome Measure ? ? Help from another person eating meals?: None ?Help from another person taking care of personal grooming?: A Little ?Help from another person toileting, which includes using toliet, bedpan, or urinal?: A Little ?Help from another person bathing (including washing, rinsing, drying)?: A Little ?Help from another person to put on and taking off regular upper body clothing?: A Little ?Help from another person to put on and taking off regular lower body clothing?: A Lot ?6 Click Score: 18 ? ?  ?End of Session   ? ?OT Visit Diagnosis: Other abnormalities of gait and mobility (R26.89) ?  ?Activity Tolerance Patient tolerated treatment well ?  ?Patient Left in chair;with call bell/phone within reach;with family/visitor present ?  ?Nurse Communication Mobility status ?  ? ?   ? ?Time: 5885-0277 ?OT Time Calculation (min): 24 min ? ?Charges: OT General Charges ?$OT Visit: 1 Visit ?OT Treatments ?$Self Care/Home Management : 23-37 mins ? ?Dessie Coma, M.S. OTR/L  ?09/18/21, 10:03 AM  ?ascom [phone] ? ?

## 2021-09-18 NOTE — Plan of Care (Signed)
Continuing with plan of care. 

## 2021-09-18 NOTE — Plan of Care (Signed)
Discharge teaching completed with patient who verbalized understanding of teaching and is in stable condition. ?

## 2021-09-18 NOTE — Discharge Summary (Signed)
?[facility]    ?Discharge Summary ? ? ? ?Patient ID:  ?Wendy Arias ?MRN: [id number] ?DOB/AGE: 01/29/1951 71 y.o. ? ?Admit date: 09/16/2021 ?Discharge date: 09/18/2021 ?Date of Surgery: 09/16/2021 ?Surgeon: Surgeon(s): ?Dew, Erskine Squibb, MD ?Delana Meyer Dolores Lory, MD ? ?Admission Diagnosis: ?Atherosclerosis of artery of extremity with rest pain ([place]) [I70.229] ? ?Discharge Diagnoses:  ?Atherosclerosis of artery of extremity with rest pain ([place]) [I70.229] ? ?Secondary Diagnoses: ?Past Medical History:  ?Diagnosis Date  ? (HFpEF) heart failure with preserved ejection fraction ([place]) 06/07/2004  ? a.) TTE 06/07/2004: EF 50-55%; inf HK. b.) TTE 04/17/2018: EF 45%; GLS -14.6%; inferoseptal and inferoposterior HK; sev LA enlargement; mild PR, mod TR, sev MR; G2DD. c.) TTE 12/05/2018: EF >55%, mild LVH; mild MR. d.) TTE 12/18/2020: EF >55%; mild LA enlargement; triv TR/PR, mod MR; G1DD.  ? Anxiety   ? Aortic atherosclerosis ([place])   ? Asthma   ? CAD (coronary artery disease) 06/08/2004  ? a.) NSTEMI 06/05/2004. b.) LHC 06/08/2004: EF 70%; 50% mLAD, 50% dLAD, 99% OM3, 50% pRCA, 50% mRCA; PCI to OM3 placing a DES x 1 (unknown type). c.) Lexi scan 09/14/2021: normal LV function with no ischemia or scar.  ? COPD (chronic obstructive pulmonary disease) ([place])   ? Depression   ? Diverticulosis   ? DOE (dyspnea on exertion)   ? Empyema of right pleural space ([place]) 02/16/2018  ? HLD (hyperlipidemia)   ? Hypertension   ? IDA (iron deficiency anemia)   ? Ischemic cardiomyopathy   ? NSTEMI (non-ST elevated myocardial infarction) ([place]) 06/05/2004  ? a.)  PCI on 06/08/2004 placing a DES x1 (unknown type) to 99% lesion in OM3.  ? Osteoporosis   ? Peripheral vascular disease ([place])   ? PONV (postoperative nausea and vomiting)   ? ? ?Procedure(s): ?ENDARTERECTOMY FEMORAL ?INSERTION OF ILIAC STENT ?APPLICATION OF CELL SAVER ? ?Discharged Condition: good ? ?HPI:  ?[name] presented on 09/16/2021 for bilateral femoral  endarterectomy with bilateral illiac stent placement.  The patient has some pain still but feels ready to return home.  Bilateral wounds are clean, dry and intact with dermabond in place.  Overall, she is doing well.  ? ?Hospital Course:  ?Wendy Arias is a 71 y.o. female is S/P Bilateral Femoral Endarterectomy with Kissing Illiac Stent Placement  ?Procedure(s): ?ENDARTERECTOMY FEMORAL ?INSERTION OF ILIAC STENT ?APPLICATION OF CELL SAVER ?Extubated: POD # 0 ?Physical exam: Wounds slightly bruised but clean and intact, warm feet, 1+ palpable pulses ?Post-op wounds clean, dry, intact, ecchymotic, or healing well ?Pt. Ambulating, voiding and taking PO diet without difficulty. ?Pt pain controlled with PO pain meds. ?Labs as below ?Complications:none ? ?Consults:  ? ? ?Significant Diagnostic Studies: ?CBC ?Lab Results  ?Component Value Date  ? WBC 8.9 09/17/2021  ? HGB 11.1 (L) 09/17/2021  ? HCT 33.6 (L) 09/17/2021  ? MCV 90.1 09/17/2021  ? PLT 136 (L) 09/17/2021  ? ? ?BMET ?   ?Component Value Date/Time  ? NA 137 09/17/2021 0004  ? NA 138 10/31/2013 1541  ? K 5.5 (H) 09/17/2021 0004  ? K 4.5 10/31/2013 1541  ? CL 111 09/17/2021 0004  ? CL 101 10/31/2013 1541  ? CO2 19 (L) 09/17/2021 0004  ? CO2 29 10/31/2013 1541  ? GLUCOSE 192 (H) 09/17/2021 0004  ? GLUCOSE 96 10/31/2013 1541  ? BUN 23 09/17/2021 0004  ? BUN 10 10/31/2013 1541  ? CREATININE 1.13 (H) 09/17/2021 0004  ? CREATININE 0.91 10/31/2013 1541  ?  CALCIUM 8.6 (L) 09/17/2021 0004  ? CALCIUM 9.1 10/31/2013 1541  ? GFRNONAA 52 (L) 09/17/2021 0004  ? GFRNONAA >60 10/31/2013 1541  ? GFRAA >60 04/13/2018 1030  ? GFRAA >60 10/31/2013 1541  ? ?COAG ?Lab Results  ?Component Value Date  ? INR 1.1 05/26/2021  ? INR 1.21 04/12/2018  ? INR 1.26 04/10/2018  ? ? ? ?Disposition:  ?Discharge to :Home ? ?Allergies as of 09/18/2021   ? ?   Reactions  ? Penicillins Other (See Comments)  ? Has patient had a PCN reaction causing immediate rash, facial/tongue/throat swelling, SOB or  lightheadedness with hypotension: Yes ?Has patient had a PCN reaction causing severe rash involving mucus membranes or skin necrosis: Unknown ?Has patient had a PCN reaction that required hospitalization: Unknown ?Has patient had a PCN reaction occurring within the last 10 years: Unknown ?If all of the above answers are "NO", then may proceed with Cephalosporin use. ?Pt has taken augmentin before  ? ?  ? ?  ?Medication List  ?  ? ?STOP taking these medications   ? ?HYDROcodone-acetaminophen 5-325 MG tablet ?Commonly known as: NORCO/VICODIN ?  ? ?  ? ?TAKE these medications   ? ?albuterol 108 (90 Base) MCG/ACT inhaler ?Commonly known as: VENTOLIN HFA ?Inhale into the lungs every 6 (six) hours as needed for wheezing or shortness of breath. ?  ?aspirin EC 81 MG tablet ?Take 1 tablet (81 mg total) by mouth daily. ?  ?atorvastatin 40 MG tablet ?Commonly known as: LIPITOR ?Take 40 mg by mouth daily. ?  ?clopidogrel 75 MG tablet ?Commonly known as: PLAVIX ?Take 1 tablet (75 mg total) by mouth daily at 6 (six) AM. ?Start taking on: September 19, 2021 ?  ?Magnesium 400 MG Caps ?Take 1 tablet by mouth daily. ?  ?metoprolol succinate 25 MG 24 hr tablet ?Commonly known as: TOPROL-XL ?Take 25 mg by mouth daily. ?  ?oxyCODONE-acetaminophen 5-325 MG tablet ?Commonly known as: Percocet ?Take 1-2 tablets by mouth every 4 (four) hours as needed for severe pain. ?  ?Trelegy Ellipta 100-62.5-25 MCG/ACT Aepb ?Generic drug: Fluticasone-Umeclidin-Vilant ?Inhale 1 puff into the lungs daily. ?  ?triamcinolone cream 0.1 % ?Commonly known as: KENALOG ?Apply topically 2 (two) times daily. ?  ?triamcinolone cream 0.1 % ?Commonly known as: KENALOG ?Apply topically 2 (two) times daily. ?  ?vitamin B-12 1000 MCG tablet ?Commonly known as: CYANOCOBALAMIN ?Take 1,000 mcg by mouth daily. ?  ?Vitamin E 268 MG (400 UNIT) Caps ?Take 400 Units by mouth daily. ?  ? ?  ? ?  ?  ? ? ?  ?Durable Medical Equipment  ?(From admission, onward)  ?  ? ? ?  ? ?  Start      Ordered  ? 09/18/21 0952  For home use only DME 3 n 1  Once       ? 09/18/21 0951  ? 09/18/21 0952  For home use only DME Walker rolling  Once       ?Question Answer Comment  ?Walker: With 5 Inch Wheels   ?Patient needs a walker to treat with the following condition Muscle weakness (generalized)   ?  ? 09/18/21 1660  ? ?  ?  ? ?  ? ?Verbal and written Discharge instructions given to the patient. Wound care per Discharge AVS ? Follow-up Information   ? ? Kris Hartmann, NP Follow up in 2 week(s).   ?Specialty: Vascular Surgery ?Why: wound check no studies ?Contact information: ?[address] ?[place] [address] ?  [phone] ? ? ?  ?  ? ?  ?  ? ?  ? ? ?Signed: ?Kris Hartmann, NP ? ?09/18/2021, 12:45 PM ? ?  ? ?

## 2021-09-24 ENCOUNTER — Telehealth (INDEPENDENT_AMBULATORY_CARE_PROVIDER_SITE_OTHER): Payer: Self-pay | Admitting: Vascular Surgery

## 2021-09-24 ENCOUNTER — Other Ambulatory Visit (INDEPENDENT_AMBULATORY_CARE_PROVIDER_SITE_OTHER): Payer: Self-pay | Admitting: Nurse Practitioner

## 2021-09-24 MED ORDER — OXYCODONE-ACETAMINOPHEN 5-325 MG PO TABS: 1.0000 | ORAL_TABLET | Freq: Four times a day (QID) | ORAL | 0 refills | Status: DC | PRN

## 2021-09-24 NOTE — Telephone Encounter (Signed)
Patient called and stated she is  in need of more OxyCODONE - 5-325 MG tablets, 1-2 every 4 hours.  She wants it sent to [facility] ?

## 2021-09-24 NOTE — Telephone Encounter (Signed)
Refill sent.

## 2021-10-05 ENCOUNTER — Ambulatory Visit (INDEPENDENT_AMBULATORY_CARE_PROVIDER_SITE_OTHER): Payer: PPO | Admitting: Nurse Practitioner

## 2021-10-05 ENCOUNTER — Encounter (INDEPENDENT_AMBULATORY_CARE_PROVIDER_SITE_OTHER): Payer: Self-pay | Admitting: Nurse Practitioner

## 2021-10-05 VITALS — BP 123/82 | HR 87 | Resp 16 | Ht 61.5 in | Wt 127.6 lb

## 2021-10-05 DIAGNOSIS — I70223 Atherosclerosis of native arteries of extremities with rest pain, bilateral legs: Secondary | ICD-10-CM

## 2021-10-05 MED ORDER — OXYCODONE-ACETAMINOPHEN 5-325 MG PO TABS: 1.0000 | ORAL_TABLET | Freq: Four times a day (QID) | ORAL | 0 refills | Status: DC | PRN

## 2021-10-05 NOTE — Progress Notes (Signed)
? ?Subjective:  ? ? Patient ID: Wendy Arias, female    DOB: [birth year], 71 y.o.   MRN: [id number] ?No chief complaint on file. ? ? ?Wendy Arias is a 71 year old female that presents today for follow-up evaluation after intervention on 09/16/2021 including: ? ?PROCEDURE: ?1. Bilateral common femoral and superficial femoral endarterectomy with Cormatrix patch angioplasty. ?2. Open angioplasty and stent placement bilateral common iliac arteries using the kissing balloon technique. ?3. Open angioplasty and stent placement right external iliac artery ?4. Open angioplasty and stent placement left external iliac artery ?5. Additional open angioplasty and stent placement for treatment of extravasation at the noncovered portion of the left iliac system ?  ? ?The patient's wounds are clean dry and intact doing well.  No signs or symptoms of infection.  The patient does complain of a burning sensation in her bilateral thighs which is not uncommon post surgery.  No signs symptoms of ischemia. ? ? ?Review of Systems  ?Skin:  Positive for wound.  ?Neurological:  Positive for numbness.  ?All other systems reviewed and are negative. ? ?   ?Objective:  ? Physical Exam ?Vitals reviewed.  ?HENT:  ?   Head: Normocephalic.  ?Cardiovascular:  ?   Rate and Rhythm: Normal rate.  ?   Pulses:     ?     Posterior tibial pulses are 1+ on the right side and 1+ on the left side.  ?Pulmonary:  ?   Effort: Pulmonary effort is normal.  ?Neurological:  ?   Mental Status: She is alert and oriented to person, place, and time.  ?Psychiatric:     ?   Mood and Affect: Mood normal.     ?   Behavior: Behavior normal.     ?   Thought Content: Thought content normal.     ?   Judgment: Judgment normal.  ? ? ?BP 123/82 (BP Location: Right Arm)   Pulse 87   Resp 16   Ht 5' 1.5" (1.562 m)   Wt 127 lb 9.6 oz (57.9 kg)   BMI 23.72 kg/m?  ? ?Past Medical History:  ?Diagnosis Date  ? (HFpEF) heart failure with preserved ejection fraction ([place]) 06/07/2004  ? a.)  TTE 06/07/2004: EF 50-55%; inf HK. b.) TTE 04/17/2018: EF 45%; GLS -14.6%; inferoseptal and inferoposterior HK; sev LA enlargement; mild PR, mod TR, sev MR; G2DD. c.) TTE 12/05/2018: EF >55%, mild LVH; mild MR. d.) TTE 12/18/2020: EF >55%; mild LA enlargement; triv TR/PR, mod MR; G1DD.  ? Anxiety   ? Aortic atherosclerosis ([place])   ? Asthma   ? CAD (coronary artery disease) 06/08/2004  ? a.) NSTEMI 06/05/2004. b.) LHC 06/08/2004: EF 70%; 50% mLAD, 50% dLAD, 99% OM3, 50% pRCA, 50% mRCA; PCI to OM3 placing a DES x 1 (unknown type). c.) Lexi scan 09/14/2021: normal LV function with no ischemia or scar.  ? COPD (chronic obstructive pulmonary disease) ([place])   ? Depression   ? Diverticulosis   ? DOE (dyspnea on exertion)   ? Empyema of right pleural space ([place]) 02/16/2018  ? HLD (hyperlipidemia)   ? Hypertension   ? IDA (iron deficiency anemia)   ? Ischemic cardiomyopathy   ? NSTEMI (non-ST elevated myocardial infarction) ([place]) 06/05/2004  ? a.)  PCI on 06/08/2004 placing a DES x1 (unknown type) to 99% lesion in OM3.  ? Osteoporosis   ? Peripheral vascular disease ([place])   ? PONV (postoperative nausea and vomiting)   ? ? ?Social History  ? ?Socioeconomic  History  ? Marital status: Divorced  ?  Spouse name: Not on file  ? Number of children: Not on file  ? Years of education: Not on file  ? Highest education level: Not on file  ?Occupational History  ? Not on file  ?Tobacco Use  ? Smoking status: Some Days  ?  Packs/day: 0.50  ?  Years: 20.00  ?  Pack years: 10.00  ?  Types: Cigarettes  ? Smokeless tobacco: Never  ?Vaping Use  ? Vaping Use: Never used  ?Substance and Sexual Activity  ? Alcohol use: Never  ?  Alcohol/week: 3.0 standard drinks  ?  Types: 1 Glasses of wine, 2 Cans of beer per week  ?  Comment: rarely  ? Drug use: Never  ? Sexual activity: Not Currently  ?Other Topics Concern  ? Not on file  ?Social History Narrative  ? Not on file  ? ?Social Determinants of Health  ? ?Financial Resource Strain: Not on file   ?Food Insecurity: Not on file  ?Transportation Needs: Not on file  ?Physical Activity: Not on file  ?Stress: Not on file  ?Social Connections: Not on file  ?Intimate Partner Violence: Not on file  ? ? ?Past Surgical History:  ?Procedure Laterality Date  ? COLONOSCOPY WITH PROPOFOL N/A 04/12/2017  ? Procedure: COLONOSCOPY WITH PROPOFOL;  Surgeon: Lollie Sails, MD;  Location[facility] ENDOSCOPY;  Service: Endoscopy;  Laterality: N/A;  ? CORONARY ANGIOPLASTY WITH STENT PLACEMENT Left 06/08/2004  ? Procedure: CORONARY ANGIOPLASTY WITH STENT PLACEMENT; Location: [facility]; Surgeon: Katrine Coho, MD  ? ENDARTERECTOMY FEMORAL Bilateral 09/16/2021  ? Procedure: ENDARTERECTOMY FEMORAL;  Surgeon: Algernon Huxley, MD;  Location: [facility] ORS;  Service: Vascular;  Laterality: Bilateral;  ? ENDOBRONCHIAL ULTRASOUND N/A 11/28/2017  ? Procedure: ENDOBRONCHIAL ULTRASOUND;  Surgeon: Laverle Hobby, MD;  Location: [facility] ORS;  Service: Pulmonary;  Laterality: N/A;  ? INSERTION OF ILIAC STENT Bilateral 09/16/2021  ? Procedure: INSERTION OF ILIAC STENT;  Surgeon: Algernon Huxley, MD;  Location: [facility] ORS;  Service: Vascular;  Laterality: Bilateral;  ? LOWER EXTREMITY ANGIOGRAPHY Right 08/12/2021  ? Procedure: Lower Extremity Angiography;  Surgeon: Algernon Huxley, MD;  Location: [facility];  Service: Cardiovascular;  Laterality: Right;  ? PARTIAL HYSTERECTOMY N/A   ? REDUCTION MAMMAPLASTY    ? VIDEO ASSISTED THORACOSCOPY (VATS)/THOROCOTOMY Right 03/2018  ? Procedure: VIDEO ASSISTED THORACOSCOPY (VATS)/THOROCOTOMY (partial decortication, loculated empyema); Location: [facility]  ? ? ?Family History  ?Problem Relation Age of Onset  ? Breast cancer Mother 47  ? Hypertension Mother   ? Dementia Mother   ? Breast cancer Maternal Aunt 42  ? Breast cancer Other 35  ? Hypertension Father   ? Heart disease Father   ? Diabetes Father   ? Diabetes Sister   ? ? ?Allergies  ?Allergen Reactions  ? Penicillins Other (See Comments)  ?  Has patient had a PCN  reaction causing immediate rash, facial/tongue/throat swelling, SOB or lightheadedness with hypotension: Yes ?Has patient had a PCN reaction causing severe rash involving mucus membranes or skin necrosis: Unknown ?Has patient had a PCN reaction that required hospitalization: Unknown ?Has patient had a PCN reaction occurring within the last 10 years: Unknown ?If all of the above answers are "NO", then may proceed with Cephalosporin use. ?Pt has taken augmentin before  ? ? ? ?  Latest Ref Rng & Units 09/17/2021  ? 12:04 AM 09/16/2021  ?  1:40 PM 09/14/2021  ?  8:36 AM  ?CBC  ?  WBC 4.0 - 10.5 K/uL 8.9   6.0   5.4    ?Hemoglobin 12.0 - 15.0 g/dL 11.1   8.1   14.0    ?Hematocrit 36.0 - 46.0 % 33.6   26.6   44.6    ?Platelets 150 - 400 K/uL 136   177   215    ? ? ? ? ?CMP  ?   ?Component Value Date/Time  ? NA 137 09/17/2021 0004  ? NA 138 10/31/2013 1541  ? K 5.5 (H) 09/17/2021 0004  ? K 4.5 10/31/2013 1541  ? CL 111 09/17/2021 0004  ? CL 101 10/31/2013 1541  ? CO2 19 (L) 09/17/2021 0004  ? CO2 29 10/31/2013 1541  ? GLUCOSE 192 (H) 09/17/2021 0004  ? GLUCOSE 96 10/31/2013 1541  ? BUN 23 09/17/2021 0004  ? BUN 10 10/31/2013 1541  ? CREATININE 1.13 (H) 09/17/2021 0004  ? CREATININE 0.91 10/31/2013 1541  ? CALCIUM 8.6 (L) 09/17/2021 0004  ? CALCIUM 9.1 10/31/2013 1541  ? PROT 6.1 (L) 05/27/2021 0704  ? PROT 7.2 10/31/2013 1541  ? ALBUMIN 3.2 (L) 05/27/2021 0704  ? ALBUMIN 4.0 10/31/2013 1541  ? AST 67 (H) 05/27/2021 0704  ? AST 14 (L) 10/31/2013 1541  ? ALT 32 05/27/2021 0704  ? ALT 20 10/31/2013 1541  ? ALKPHOS 66 05/27/2021 0704  ? ALKPHOS 82 10/31/2013 1541  ? BILITOT 0.4 05/27/2021 0704  ? BILITOT 0.2 10/31/2013 1541  ? GFRNONAA 52 (L) 09/17/2021 0004  ? GFRNONAA >60 10/31/2013 1541  ? GFRAA >60 04/13/2018 1030  ? GFRAA >60 10/31/2013 1541  ? ? ? ?No results found. ? ?   ?Assessment & Plan:  ? ?1. Atherosclerosis of native artery of both lower extremities with rest pain ([place]) ?Wounds are continuing to heal well.  We will  renew patient's pain medicine to help with the continued discomfort in her thigh area.  We will have patient return in 2 weeks with noninvasive studies. ?- oxyCODONE-acetaminophen (PERCOCET) 5-325 MG tablet; Take

## 2021-10-05 NOTE — Progress Notes (Deleted)
123/82  ?

## 2021-10-19 ENCOUNTER — Telehealth (INDEPENDENT_AMBULATORY_CARE_PROVIDER_SITE_OTHER): Payer: Self-pay

## 2021-10-20 ENCOUNTER — Other Ambulatory Visit (INDEPENDENT_AMBULATORY_CARE_PROVIDER_SITE_OTHER): Payer: Self-pay | Admitting: Nurse Practitioner

## 2021-10-20 DIAGNOSIS — I70223 Atherosclerosis of native arteries of extremities with rest pain, bilateral legs: Secondary | ICD-10-CM

## 2021-10-20 MED ORDER — OXYCODONE-ACETAMINOPHEN 5-325 MG PO TABS: 1.0000 | ORAL_TABLET | Freq: Three times a day (TID) | ORAL | 0 refills | Status: DC | PRN

## 2021-10-20 MED ORDER — GABAPENTIN 300 MG PO CAPS: 300.0000 mg | ORAL_CAPSULE | Freq: Every day | ORAL | 0 refills | Status: DC

## 2021-10-20 NOTE — Telephone Encounter (Signed)
Following discussion with patient Rx sent, will continue to follow up next week

## 2021-10-29 ENCOUNTER — Ambulatory Visit (INDEPENDENT_AMBULATORY_CARE_PROVIDER_SITE_OTHER): Payer: PPO | Admitting: Nurse Practitioner

## 2021-10-29 ENCOUNTER — Ambulatory Visit (INDEPENDENT_AMBULATORY_CARE_PROVIDER_SITE_OTHER): Payer: PPO

## 2021-10-29 ENCOUNTER — Encounter (INDEPENDENT_AMBULATORY_CARE_PROVIDER_SITE_OTHER): Payer: Self-pay | Admitting: Nurse Practitioner

## 2021-10-29 VITALS — BP 169/75 | HR 83 | Resp 16 | Wt 126.0 lb

## 2021-10-29 DIAGNOSIS — I70223 Atherosclerosis of native arteries of extremities with rest pain, bilateral legs: Secondary | ICD-10-CM

## 2021-10-29 DIAGNOSIS — I10 Essential (primary) hypertension: Secondary | ICD-10-CM

## 2021-10-29 DIAGNOSIS — E7800 Pure hypercholesterolemia, unspecified: Secondary | ICD-10-CM

## 2021-10-29 MED ORDER — OXYCODONE-ACETAMINOPHEN 5-325 MG PO TABS: 1.0000 | ORAL_TABLET | Freq: Three times a day (TID) | ORAL | 0 refills | Status: DC | PRN

## 2021-10-29 MED ORDER — GABAPENTIN 300 MG PO CAPS: 600.0000 mg | ORAL_CAPSULE | Freq: Every day | ORAL | 3 refills | Status: DC

## 2021-11-05 ENCOUNTER — Telehealth (INDEPENDENT_AMBULATORY_CARE_PROVIDER_SITE_OTHER): Payer: Self-pay

## 2021-11-05 NOTE — Telephone Encounter (Signed)
She was sent in a refill on 5/4.  It doesn't appear that she has picked that up.  That refill should last at least 2 weeks

## 2021-11-05 NOTE — Telephone Encounter (Signed)
Spoke with pt and she states verbal understanding ?

## 2021-11-05 NOTE — Telephone Encounter (Signed)
Pt LVM stating she is taking her Percocet as she was told and now needs a refill.  Please advise. ?

## 2021-11-12 ENCOUNTER — Encounter (INDEPENDENT_AMBULATORY_CARE_PROVIDER_SITE_OTHER): Payer: Self-pay | Admitting: Nurse Practitioner

## 2021-11-12 NOTE — Progress Notes (Incomplete)
Subjective:    Patient ID: Wendy Arias, female    DOB: 06/12/1951, 71 y.o.   MRN: [id number] Chief Complaint  Patient presents with  . Follow-up    Ultrasound follow up    HPI  Review of Systems     Objective:   Physical Exam  BP (!) 169/75 (BP Location: Right Arm)   Pulse 83   Resp 16   Wt 126 lb (57.2 kg)   BMI 23.42 kg/m   Past Medical History:  Diagnosis Date  . (HFpEF) heart failure with preserved ejection fraction ([place]) 06/07/2004   a.) TTE 06/07/2004: EF 50-55%; inf HK. b.) TTE 04/17/2018: EF 45%; GLS -14.6%; inferoseptal and inferoposterior HK; sev LA enlargement; mild PR, mod TR, sev MR; G2DD. c.) TTE 12/05/2018: EF >55%, mild LVH; mild MR. d.) TTE 12/18/2020: EF >55%; mild LA enlargement; triv TR/PR, mod MR; G1DD.  Marland Kitchen Anxiety   . Aortic atherosclerosis ([place])   . Asthma   . CAD (coronary artery disease) 06/08/2004   a.) NSTEMI 06/05/2004. b.) LHC 06/08/2004: EF 70%; 50% mLAD, 50% dLAD, 99% OM3, 50% pRCA, 50% mRCA; PCI to OM3 placing a DES x 1 (unknown type). c.) Lexi scan 09/14/2021: normal LV function with no ischemia or scar.  Marland Kitchen COPD (chronic obstructive pulmonary disease) ([place])   . Depression   . Diverticulosis   . DOE (dyspnea on exertion)   . Empyema of right pleural space ([place]) 02/16/2018  . HLD (hyperlipidemia)   . Hypertension   . IDA (iron deficiency anemia)   . Ischemic cardiomyopathy   . NSTEMI (non-ST elevated myocardial infarction) ([place]) 06/05/2004   a.)  PCI on 06/08/2004 placing a DES x1 (unknown type) to 99% lesion in OM3.  . Osteoporosis   . Peripheral vascular disease ([place])   . PONV (postoperative nausea and vomiting)     Social History   Socioeconomic History  . Marital status: Divorced    Spouse name: Not on file  . Number of children: Not on file  . Years of education: Not on file  . Highest education level: Not on file  Occupational History  . Not on file  Tobacco Use  . Smoking status: Some Days    Packs/day: 0.50     Years: 20.00    Pack years: 10.00    Types: Cigarettes  . Smokeless tobacco: Never  Vaping Use  . Vaping Use: Never used  Substance and Sexual Activity  . Alcohol use: Never    Alcohol/week: 3.0 standard drinks    Types: 1 Glasses of wine, 2 Cans of beer per week    Comment: rarely  . Drug use: Never  . Sexual activity: Not Currently  Other Topics Concern  . Not on file  Social History Narrative  . Not on file   Social Determinants of Health   Financial Resource Strain: Not on file  Food Insecurity: Not on file  Transportation Needs: Not on file  Physical Activity: Not on file  Stress: Not on file  Social Connections: Not on file  Intimate Partner Violence: Not on file    Past Surgical History:  Procedure Laterality Date  . COLONOSCOPY WITH PROPOFOL N/A 04/12/2017   Procedure: COLONOSCOPY WITH PROPOFOL;  Surgeon: Lollie Sails, MD;  Location: [facility] ENDOSCOPY;  Service: Endoscopy;  Laterality: N/A;  . CORONARY ANGIOPLASTY WITH STENT PLACEMENT Left 06/08/2004   Procedure: CORONARY ANGIOPLASTY WITH STENT PLACEMENT; Location: [place]; Surgeon: Katrine Coho, MD  . ENDARTERECTOMY FEMORAL Bilateral 09/16/2021  Procedure: ENDARTERECTOMY FEMORAL;  Surgeon: Algernon Huxley, MD;  Location: [facility] ORS;  Service: Vascular;  Laterality: Bilateral;  . ENDOBRONCHIAL ULTRASOUND N/A 11/28/2017   Procedure: ENDOBRONCHIAL ULTRASOUND;  Surgeon: Laverle Hobby, MD;  Location: [facility] ORS;  Service: Pulmonary;  Laterality: N/A;  . INSERTION OF ILIAC STENT Bilateral 09/16/2021   Procedure: INSERTION OF ILIAC STENT;  Surgeon: Algernon Huxley, MD;  Location: [facility] ORS;  Service: Vascular;  Laterality: Bilateral;  . LOWER EXTREMITY ANGIOGRAPHY Right 08/12/2021   Procedure: Lower Extremity Angiography;  Surgeon: Algernon Huxley, MD;  Location: [place] [facility];  Service: Cardiovascular;  Laterality: Right;  . PARTIAL HYSTERECTOMY N/A   . REDUCTION MAMMAPLASTY    . VIDEO ASSISTED THORACOSCOPY  (VATS)/THOROCOTOMY Right 03/2018   Procedure: VIDEO ASSISTED THORACOSCOPY (VATS)/THOROCOTOMY (partial decortication, loculated empyema); Location: [facility]    Family History  Problem Relation Age of Onset  . Breast cancer Mother 6  . Hypertension Mother   . Dementia Mother   . Breast cancer Maternal Aunt 70  . Breast cancer Other 35  . Hypertension Father   . Heart disease Father   . Diabetes Father   . Diabetes Sister     Allergies  Allergen Reactions  . Penicillins Other (See Comments)    Has patient had a PCN reaction causing immediate rash, facial/tongue/throat swelling, SOB or lightheadedness with hypotension: Yes Has patient had a PCN reaction causing severe rash involving mucus membranes or skin necrosis: Unknown Has patient had a PCN reaction that required hospitalization: Unknown Has patient had a PCN reaction occurring within the last 10 years: Unknown If all of the above answers are "NO", then may proceed with Cephalosporin use. Pt has taken augmentin before       Latest Ref Rng & Units 09/17/2021   12:04 AM 09/16/2021    1:40 PM 09/14/2021    8:36 AM  CBC  WBC 4.0 - 10.5 K/uL 8.9   6.0   5.4    Hemoglobin 12.0 - 15.0 g/dL 11.1   8.1   14.0    Hematocrit 36.0 - 46.0 % 33.6   26.6   44.6    Platelets 150 - 400 K/uL 136   177   215        CMP     Component Value Date/Time   NA 137 09/17/2021 0004   NA 138 10/31/2013 1541   K 5.5 (H) 09/17/2021 0004   K 4.5 10/31/2013 1541   CL 111 09/17/2021 0004   CL 101 10/31/2013 1541   CO2 19 (L) 09/17/2021 0004   CO2 29 10/31/2013 1541   GLUCOSE 192 (H) 09/17/2021 0004   GLUCOSE 96 10/31/2013 1541   BUN 23 09/17/2021 0004   BUN 10 10/31/2013 1541   CREATININE 1.13 (H) 09/17/2021 0004   CREATININE 0.91 10/31/2013 1541   CALCIUM 8.6 (L) 09/17/2021 0004   CALCIUM 9.1 10/31/2013 1541   PROT 6.1 (L) 05/27/2021 0704   PROT 7.2 10/31/2013 1541   ALBUMIN 3.2 (L) 05/27/2021 0704   ALBUMIN 4.0 10/31/2013 1541   AST 67  (H) 05/27/2021 0704   AST 14 (L) 10/31/2013 1541   ALT 32 05/27/2021 0704   ALT 20 10/31/2013 1541   ALKPHOS 66 05/27/2021 0704   ALKPHOS 82 10/31/2013 1541   BILITOT 0.4 05/27/2021 0704   BILITOT 0.2 10/31/2013 1541   GFRNONAA 52 (L) 09/17/2021 0004   GFRNONAA >60 10/31/2013 1541   GFRAA >60 04/13/2018 1030   GFRAA >60 10/31/2013 1541  VAS [place] ABI WITH/WO TBI  Result Date: 11/02/2021  LOWER EXTREMITY DOPPLER STUDY Patient Name:  NAYLEA WIGINGTON  Date of Exam:   10/29/2021 Medical Rec #: [id number]     Accession #:    [id number] Date of Birth: 01/13/1951     Patient Gender: F Patient Age:   63 years Exam Location:  [facility] Procedure:      VAS [place] ABI WITH/WO TBI Referring Phys: --------------------------------------------------------------------------------  Indications: Peripheral artery disease.  Vascular Interventions: 09/16/2021 Bilat FEA, Bilat CIA EIA stents. Comparison Study: 08/10/2021 Performing Technologist: Concha Norway RVT  Examination Guidelines: A complete evaluation includes at minimum, Doppler waveform signals and systolic blood pressure reading at the level of bilateral brachial, anterior tibial, and posterior tibial arteries, when vessel segments are accessible. Bilateral testing is considered an integral part of a complete examination. Photoelectric Plethysmograph (PPG) waveforms and toe systolic pressure readings are included as required and additional duplex testing as needed. Limited examinations for reoccurring indications may be performed as noted.  ABI Findings: +---------+------------------+-----+--------+--------+ Right    Rt Pressure (mmHg)IndexWaveformComment  +---------+------------------+-----+--------+--------+ Brachial 97                                      +---------+------------------+-----+--------+--------+ ATA      115               1.15 biphasic         +---------+------------------+-----+--------+--------+ PTA      106                1.06 biphasic         +---------+------------------+-----+--------+--------+ Great Toe94                0.94 Normal           +---------+------------------+-----+--------+--------+ +---------+------------------+-----+---------+-------+ Left     Lt Pressure (mmHg)IndexWaveform Comment +---------+------------------+-----+---------+-------+ Brachial 100                                     +---------+------------------+-----+---------+-------+ ATA      109               1.09 biphasic         +---------+------------------+-----+---------+-------+ PTA      132               1.32 triphasic        +---------+------------------+-----+---------+-------+ Great Toe103               1.03 Normal           +---------+------------------+-----+---------+-------+ +-------+-----------+-----------+------------+------------+ ABI/TBIToday's ABIToday's TBIPrevious ABIPrevious TBI +-------+-----------+-----------+------------+------------+ Right  1.15       .94        .34         .31          +-------+-----------+-----------+------------+------------+ Left   1.09       1.03       .53         .31          +-------+-----------+-----------+------------+------------+  Bilateral ABIs and TBIs appear increased compared to prior study on 07/2021.  Summary: Right: Resting right ankle-brachial index is within normal range. No evidence of significant right lower extremity arterial disease. The right toe-brachial index is normal. Left: Resting left ankle-brachial index is within normal range. No evidence of significant left  lower extremity arterial disease. The left toe-brachial index is normal. *See table(s) above for measurements and observations.  Electronically signed by [name] MD on 11/02/2021 at 12:26:53 PM.    Final        Assessment & Plan:   1. Atherosclerosis of native artery of both lower extremities with rest pain (HCC) *** - VAS [place] ABI WITH/WO TBI -  oxyCODONE-acetaminophen (PERCOCET) 5-325 MG tablet; Take 1 tablet by mouth every 8 (eight) hours as needed for severe pain.  Dispense: 42 tablet; Refill: 0  2. Pure hypercholesterolemia Continue statin as ordered and reviewed, no changes at this time   3. Essential hypertension Continue antihypertensive medications as already ordered, these medications have been reviewed and there are no changes at this time.    Current Outpatient Medications on File Prior to Visit  Medication Sig Dispense Refill  . albuterol (PROVENTIL HFA;VENTOLIN HFA) 108 (90 Base) MCG/ACT inhaler Inhale into the lungs every 6 (six) hours as needed for wheezing or shortness of breath.    Marland Kitchen aspirin EC 81 MG tablet Take 1 tablet (81 mg total) by mouth daily. 150 tablet 2  . atorvastatin (LIPITOR) 40 MG tablet Take 40 mg by mouth daily.    . clopidogrel (PLAVIX) 75 MG tablet Take 1 tablet (75 mg total) by mouth daily at 6 (six) AM. 30 tablet 6  . Magnesium 400 MG CAPS Take 1 tablet by mouth daily.    . metoprolol succinate (TOPROL-XL) 25 MG 24 hr tablet Take 25 mg by mouth daily.    . TRELEGY ELLIPTA 100-62.5-25 MCG/ACT AEPB Inhale 1 puff into the lungs daily. 1 each 3  . triamcinolone cream (KENALOG) 0.1 % Apply topically 2 (two) times daily.    Marland Kitchen triamcinolone cream (KENALOG) 0.1 % Apply topically 2 (two) times daily.    . vitamin B-12 (CYANOCOBALAMIN) 1000 MCG tablet Take 1,000 mcg by mouth daily.    . Vitamin E 268 MG (400 UNIT) CAPS Take 400 Units by mouth daily.     No current facility-administered medications on file prior to visit.    There are no Patient Instructions on file for this visit. No follow-ups on file.   Kris Hartmann, NP

## 2021-11-12 NOTE — Progress Notes (Signed)
Subjective:    Patient ID: Wendy Arias, female    DOB: [birth year], 71 y.o.   MRN: [id number] Chief Complaint  Patient presents with   Follow-up    Ultrasound follow up    The patient's wounds are clean dry and intact doing well.  No signs or symptoms of infection.  The patient does complain of a burning sensation in her bilateral thighs which is not uncommon post surgery.  She notes that the symptoms have improved some but they still persist mostly during the evening.  No signs symptoms of ischemia.  The patient's right ABI is 1.15 with a left of 1.09 the previous ABIs prior to intervention was 0.34 on the right and 0.53 on the left.  She has multiphasic waveforms bilaterally with normal toe waveforms bilaterally   Review of Systems  Neurological:  Positive for numbness.  All other systems reviewed and are negative.     Objective:   Physical Exam Vitals reviewed.  HENT:     Head: Normocephalic.  Cardiovascular:     Rate and Rhythm: Normal rate.     Pulses: Normal pulses.  Pulmonary:     Effort: Pulmonary effort is normal.  Skin:    General: Skin is warm and dry.  Neurological:     Mental Status: She is alert and oriented to person, place, and time.  Psychiatric:        Mood and Affect: Mood normal.        Behavior: Behavior normal.        Thought Content: Thought content normal.        Judgment: Judgment normal.    BP (!) 169/75 (BP Location: Right Arm)   Pulse 83   Resp 16   Wt 126 lb (57.2 kg)   BMI 23.42 kg/m   Past Medical History:  Diagnosis Date   (HFpEF) heart failure with preserved ejection fraction ([place]) 06/07/2004   a.) TTE 06/07/2004: EF 50-55%; inf HK. b.) TTE 04/17/2018: EF 45%; GLS -14.6%; inferoseptal and inferoposterior HK; sev LA enlargement; mild PR, mod TR, sev MR; G2DD. c.) TTE 12/05/2018: EF >55%, mild LVH; mild MR. d.) TTE 12/18/2020: EF >55%; mild LA enlargement; triv TR/PR, mod MR; G1DD.   Anxiety    Aortic atherosclerosis (HCC)     Asthma    CAD (coronary artery disease) 06/08/2004   a.) NSTEMI 06/05/2004. b.) LHC 06/08/2004: EF 70%; 50% mLAD, 50% dLAD, 99% OM3, 50% pRCA, 50% mRCA; PCI to OM3 placing a DES x 1 (unknown type). c.) Lexi scan 09/14/2021: normal LV function with no ischemia or scar.   COPD (chronic obstructive pulmonary disease) (HCC)    Depression    Diverticulosis    DOE (dyspnea on exertion)    Empyema of right pleural space (HCC) 02/16/2018   HLD (hyperlipidemia)    Hypertension    IDA (iron deficiency anemia)    Ischemic cardiomyopathy    NSTEMI (non-ST elevated myocardial infarction) ([place]) 06/05/2004   a.)  PCI on 06/08/2004 placing a DES x1 (unknown type) to 99% lesion in OM3.   Osteoporosis    Peripheral vascular disease (HCC)    PONV (postoperative nausea and vomiting)     Social History   Socioeconomic History   Marital status: Divorced    Spouse name: Not on file   Number of children: Not on file   Years of education: Not on file   Highest education level: Not on file  Occupational History   Not on file  Tobacco Use   Smoking status: Some Days    Packs/day: 0.50    Years: 20.00    Pack years: 10.00    Types: Cigarettes   Smokeless tobacco: Never  Vaping Use   Vaping Use: Never used  Substance and Sexual Activity   Alcohol use: Never    Alcohol/week: 3.0 standard drinks    Types: 1 Glasses of wine, 2 Cans of beer per week    Comment: rarely   Drug use: Never   Sexual activity: Not Currently  Other Topics Concern   Not on file  Social History Narrative   Not on file   Social Determinants of Health   Financial Resource Strain: Not on file  Food Insecurity: Not on file  Transportation Needs: Not on file  Physical Activity: Not on file  Stress: Not on file  Social Connections: Not on file  Intimate Partner Violence: Not on file    Past Surgical History:  Procedure Laterality Date   COLONOSCOPY WITH PROPOFOL N/A 04/12/2017   Procedure: COLONOSCOPY WITH PROPOFOL;   Surgeon: Lollie Sails, MD;  Location: [facility] ENDOSCOPY;  Service: Endoscopy;  Laterality: N/A;   CORONARY ANGIOPLASTY WITH STENT PLACEMENT Left 06/08/2004   Procedure: CORONARY ANGIOPLASTY WITH STENT PLACEMENT; Location: [place]; Surgeon: Katrine Coho, MD   ENDARTERECTOMY FEMORAL Bilateral 09/16/2021   Procedure: ENDARTERECTOMY FEMORAL;  Surgeon: Algernon Huxley, MD;  Location: [facility] ORS;  Service: Vascular;  Laterality: Bilateral;   ENDOBRONCHIAL ULTRASOUND N/A 11/28/2017   Procedure: ENDOBRONCHIAL ULTRASOUND;  Surgeon: Laverle Hobby, MD;  Location: [facility];  Service: Pulmonary;  Laterality: N/A;   INSERTION OF ILIAC STENT Bilateral 09/16/2021   Procedure: INSERTION OF ILIAC STENT;  Surgeon: Algernon Huxley, MD;  Location: [facility];  Service: Vascular;  Laterality: Bilateral;   LOWER EXTREMITY ANGIOGRAPHY Right 08/12/2021   Procedure: Lower Extremity Angiography;  Surgeon: Algernon Huxley, MD;  Location: [place] [facility];  Service: Cardiovascular;  Laterality: Right;   PARTIAL HYSTERECTOMY N/A    REDUCTION MAMMAPLASTY     VIDEO ASSISTED THORACOSCOPY (VATS)/THOROCOTOMY Right 03/2018   Procedure: VIDEO ASSISTED THORACOSCOPY (VATS)/THOROCOTOMY (partial decortication, loculated empyema); Location: [facility]    Family History  Problem Relation Age of Onset   Breast cancer Mother 19   Hypertension Mother    Dementia Mother    Breast cancer Maternal Aunt 65   Breast cancer Other 67   Hypertension Father    Heart disease Father    Diabetes Father    Diabetes Sister     Allergies  Allergen Reactions   Penicillins Other (See Comments)    Has patient had a PCN reaction causing immediate rash, facial/tongue/throat swelling, SOB or lightheadedness with hypotension: Yes Has patient had a PCN reaction causing severe rash involving mucus membranes or skin necrosis: Unknown Has patient had a PCN reaction that required hospitalization: Unknown Has patient had a PCN reaction occurring within the  last 10 years: Unknown If all of the above answers are "NO", then may proceed with Cephalosporin use. Pt has taken augmentin before       Latest Ref Rng & Units 09/17/2021   12:04 AM 09/16/2021    1:40 PM 09/14/2021    8:36 AM  CBC  WBC 4.0 - 10.5 K/uL 8.9   6.0   5.4    Hemoglobin 12.0 - 15.0 g/dL 11.1   8.1   14.0    Hematocrit 36.0 - 46.0 % 33.6   26.6   44.6    Platelets 150 -  400 K/uL 136   177   215        CMP     Component Value Date/Time   NA 137 09/17/2021 0004   NA 138 10/31/2013 1541   K 5.5 (H) 09/17/2021 0004   K 4.5 10/31/2013 1541   CL 111 09/17/2021 0004   CL 101 10/31/2013 1541   CO2 19 (L) 09/17/2021 0004   CO2 29 10/31/2013 1541   GLUCOSE 192 (H) 09/17/2021 0004   GLUCOSE 96 10/31/2013 1541   BUN 23 09/17/2021 0004   BUN 10 10/31/2013 1541   CREATININE 1.13 (H) 09/17/2021 0004   CREATININE 0.91 10/31/2013 1541   CALCIUM 8.6 (L) 09/17/2021 0004   CALCIUM 9.1 10/31/2013 1541   PROT 6.1 (L) 05/27/2021 0704   PROT 7.2 10/31/2013 1541   ALBUMIN 3.2 (L) 05/27/2021 0704   ALBUMIN 4.0 10/31/2013 1541   AST 67 (H) 05/27/2021 0704   AST 14 (L) 10/31/2013 1541   ALT 32 05/27/2021 0704   ALT 20 10/31/2013 1541   ALKPHOS 66 05/27/2021 0704   ALKPHOS 82 10/31/2013 1541   BILITOT 0.4 05/27/2021 0704   BILITOT 0.2 10/31/2013 1541   GFRNONAA 52 (L) 09/17/2021 0004   GFRNONAA >60 10/31/2013 1541   GFRAA >60 04/13/2018 1030   GFRAA >60 10/31/2013 1541     VAS [place] ABI WITH/WO TBI  Result Date: 11/02/2021  LOWER EXTREMITY DOPPLER STUDY Patient Name:  Wendy Arias  Date of Exam:   10/29/2021 Medical Rec #: [id number]     Accession #:    [id number] Date of Birth: October 18, 1950     Patient Gender: F Patient Age:   49 years Exam Location:  [facility] Procedure:      VAS [place] ABI WITH/WO TBI Referring Phys: --------------------------------------------------------------------------------  Indications: Peripheral artery disease.  Vascular Interventions: 09/16/2021  Bilat FEA, Bilat CIA EIA stents. Comparison Study: 08/10/2021 Performing Technologist: Concha Norway RVT  Examination Guidelines: A complete evaluation includes at minimum, Doppler waveform signals and systolic blood pressure reading at the level of bilateral brachial, anterior tibial, and posterior tibial arteries, when vessel segments are accessible. Bilateral testing is considered an integral part of a complete examination. Photoelectric Plethysmograph (PPG) waveforms and toe systolic pressure readings are included as required and additional duplex testing as needed. Limited examinations for reoccurring indications may be performed as noted.  ABI Findings: +---------+------------------+-----+--------+--------+ Right    Rt Pressure (mmHg)IndexWaveformComment  +---------+------------------+-----+--------+--------+ Brachial 97                                      +---------+------------------+-----+--------+--------+ ATA      115               1.15 biphasic         +---------+------------------+-----+--------+--------+ PTA      106               1.06 biphasic         +---------+------------------+-----+--------+--------+ Great Toe94                0.94 Normal           +---------+------------------+-----+--------+--------+ +---------+------------------+-----+---------+-------+ Left     Lt Pressure (mmHg)IndexWaveform Comment +---------+------------------+-----+---------+-------+ Brachial 100                                     +---------+------------------+-----+---------+-------+  ATA      109               1.09 biphasic         +---------+------------------+-----+---------+-------+ PTA      132               1.32 triphasic        +---------+------------------+-----+---------+-------+ Great Toe103               1.03 Normal           +---------+------------------+-----+---------+-------+ +-------+-----------+-----------+------------+------------+  ABI/TBIToday's ABIToday's TBIPrevious ABIPrevious TBI +-------+-----------+-----------+------------+------------+ Right  1.15       .94        .34         .31          +-------+-----------+-----------+------------+------------+ Left   1.09       1.03       .53         .31          +-------+-----------+-----------+------------+------------+  Bilateral ABIs and TBIs appear increased compared to prior study on 07/2021.  Summary: Right: Resting right ankle-brachial index is within normal range. No evidence of significant right lower extremity arterial disease. The right toe-brachial index is normal. Left: Resting left ankle-brachial index is within normal range. No evidence of significant left lower extremity arterial disease. The left toe-brachial index is normal. *See table(s) above for measurements and observations.  Electronically signed by [name] MD on 11/02/2021 at 12:26:53 PM.    Final        Assessment & Plan:   1. Atherosclerosis of native artery of both lower extremities with rest pain [facility]) Discussed the pain with the patient.  She was started on gabapentin and we will try to start weaning her pain medication.  If the patient's numbness and tingling worsens we may send her for evaluation of her lower back to see if this is contributing and possibly worsening her symptoms. - VAS [place] ABI WITH/WO TBI - oxyCODONE-acetaminophen (PERCOCET) 5-325 MG tablet; Take 1 tablet by mouth every 8 (eight) hours as needed for severe pain.  Dispense: 42 tablet; Refill: 0  2. Pure hypercholesterolemia Continue statin as ordered and reviewed, no changes at this time   3. Essential hypertension Continue antihypertensive medications as already ordered, these medications have been reviewed and there are no changes at this time.    Current Outpatient Medications on File Prior to Visit  Medication Sig Dispense Refill   albuterol (PROVENTIL HFA;VENTOLIN HFA) 108 (90 Base) MCG/ACT inhaler Inhale into  the lungs every 6 (six) hours as needed for wheezing or shortness of breath.     aspirin EC 81 MG tablet Take 1 tablet (81 mg total) by mouth daily. 150 tablet 2   atorvastatin (LIPITOR) 40 MG tablet Take 40 mg by mouth daily.     clopidogrel (PLAVIX) 75 MG tablet Take 1 tablet (75 mg total) by mouth daily at 6 (six) AM. 30 tablet 6   Magnesium 400 MG CAPS Take 1 tablet by mouth daily.     metoprolol succinate (TOPROL-XL) 25 MG 24 hr tablet Take 25 mg by mouth daily.     TRELEGY ELLIPTA 100-62.5-25 MCG/ACT AEPB Inhale 1 puff into the lungs daily. 1 each 3   triamcinolone cream (KENALOG) 0.1 % Apply topically 2 (two) times daily.     triamcinolone cream (KENALOG) 0.1 % Apply topically 2 (two) times daily.     vitamin B-12 (CYANOCOBALAMIN) 1000 MCG tablet Take 1,000 mcg  by mouth daily.     Vitamin E 268 MG (400 UNIT) CAPS Take 400 Units by mouth daily.     No current facility-administered medications on file prior to visit.    There are no Patient Instructions on file for this visit. No follow-ups on file.   Kris Hartmann, NP

## 2021-11-18 ENCOUNTER — Telehealth (INDEPENDENT_AMBULATORY_CARE_PROVIDER_SITE_OTHER): Payer: Self-pay

## 2021-11-18 NOTE — Telephone Encounter (Signed)
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  We can increase her gabapentin we will also place a referral to neurosurgery for evaluation.  As far as the swelling when I last saw her the swelling was much improved, or has it suddenly worsened since I saw her last?

## 2021-11-18 NOTE — Telephone Encounter (Signed)
Patient was made aware with medical advice. Patient informed that the swelling has not increase but has moved down to her knee. Patient is not able to place a sheet on her legs and is having sharp pain. Patient is requesting refill for pain medication Oxycodone but if she is not able to receive this medication she is fine with any other medication for pain relief.

## 2021-11-19 ENCOUNTER — Other Ambulatory Visit (INDEPENDENT_AMBULATORY_CARE_PROVIDER_SITE_OTHER): Payer: Self-pay | Admitting: Nurse Practitioner

## 2021-11-19 DIAGNOSIS — I70223 Atherosclerosis of native arteries of extremities with rest pain, bilateral legs: Secondary | ICD-10-CM

## 2021-11-19 MED ORDER — OXYCODONE-ACETAMINOPHEN 5-325 MG PO TABS: 1.0000 | ORAL_TABLET | Freq: Three times a day (TID) | ORAL | 0 refills | Status: DC | PRN

## 2021-11-19 NOTE — Telephone Encounter (Signed)
Patient made aware.

## 2021-11-19 NOTE — Telephone Encounter (Signed)
Refill sent.

## 2021-12-01 ENCOUNTER — Other Ambulatory Visit (INDEPENDENT_AMBULATORY_CARE_PROVIDER_SITE_OTHER): Payer: Self-pay | Admitting: Nurse Practitioner

## 2021-12-01 DIAGNOSIS — I70223 Atherosclerosis of native arteries of extremities with rest pain, bilateral legs: Secondary | ICD-10-CM

## 2021-12-01 MED ORDER — OXYCODONE-ACETAMINOPHEN 5-325 MG PO TABS: 1.0000 | ORAL_TABLET | Freq: Three times a day (TID) | ORAL | 0 refills | Status: DC | PRN

## 2021-12-01 NOTE — Telephone Encounter (Signed)
Referral placed and pain medication sent

## 2021-12-01 NOTE — Telephone Encounter (Signed)
Patient made aware.

## 2021-12-15 ENCOUNTER — Telehealth (INDEPENDENT_AMBULATORY_CARE_PROVIDER_SITE_OTHER): Payer: Self-pay

## 2021-12-15 NOTE — Telephone Encounter (Signed)
Pt LVM stating that she was referred to [facility] and she made the appt but it is not until Sept 19th.  The nuero office advised pt that she should continue using this office for pain management until then.  Please advise pt is currently using oxy 5-325 from Eulogio Ditch NP

## 2021-12-15 NOTE — Telephone Encounter (Signed)
Called pt and told her she would have to call her PCP and pt states that she will get in touch with her PCP to see what they can do.

## 2021-12-18 ENCOUNTER — Other Ambulatory Visit: Payer: Self-pay | Admitting: Family Medicine

## 2021-12-18 ENCOUNTER — Other Ambulatory Visit (HOSPITAL_COMMUNITY): Payer: Self-pay | Admitting: Family Medicine

## 2021-12-18 DIAGNOSIS — M545 Low back pain, unspecified: Secondary | ICD-10-CM

## 2021-12-18 DIAGNOSIS — G629 Polyneuropathy, unspecified: Secondary | ICD-10-CM

## 2021-12-18 DIAGNOSIS — R200 Anesthesia of skin: Secondary | ICD-10-CM

## 2021-12-20 ENCOUNTER — Ambulatory Visit
Admission: RE | Admit: 2021-12-20 | Discharge: 2021-12-20 | Disposition: A | Discharge status: Home / Self Care | Payer: PPO | Source: Ambulatory Visit | Attending: Family Medicine | Admitting: Family Medicine

## 2021-12-20 DIAGNOSIS — G629 Polyneuropathy, unspecified: Secondary | ICD-10-CM | POA: Diagnosis present

## 2021-12-20 DIAGNOSIS — R200 Anesthesia of skin: Secondary | ICD-10-CM | POA: Diagnosis present

## 2021-12-20 DIAGNOSIS — M545 Low back pain, unspecified: Secondary | ICD-10-CM | POA: Diagnosis present

## 2021-12-22 ENCOUNTER — Other Ambulatory Visit: Payer: Self-pay | Admitting: Family Medicine

## 2021-12-22 DIAGNOSIS — M6289 Other specified disorders of muscle: Secondary | ICD-10-CM

## 2022-01-05 ENCOUNTER — Ambulatory Visit
Admission: RE | Admit: 2022-01-05 | Discharge: 2022-01-05 | Disposition: A | Discharge status: Home / Self Care | Payer: No Typology Code available for payment source | Source: Ambulatory Visit | Attending: Family Medicine | Admitting: Family Medicine

## 2022-01-05 DIAGNOSIS — M6289 Other specified disorders of muscle: Secondary | ICD-10-CM

## 2022-01-05 MED ORDER — IOPAMIDOL (ISOVUE-300) INJECTION 61%
100.0000 mL | Freq: Once | INTRAVENOUS | Status: AC | PRN
Start: 2022-01-05 — End: 2022-01-05
  Administered 2022-01-05: 100 mL via INTRAVENOUS

## 2022-01-05 MED ORDER — IOPAMIDOL (ISOVUE-300) INJECTION 61%
100.0000 mL | Freq: Once | INTRAVENOUS | Status: DC | PRN
Start: 2022-01-05 — End: 2022-01-06

## 2022-01-29 ENCOUNTER — Encounter (INDEPENDENT_AMBULATORY_CARE_PROVIDER_SITE_OTHER): Payer: Self-pay

## 2022-01-29 ENCOUNTER — Encounter (INDEPENDENT_AMBULATORY_CARE_PROVIDER_SITE_OTHER): Payer: PPO

## 2022-01-29 ENCOUNTER — Ambulatory Visit (INDEPENDENT_AMBULATORY_CARE_PROVIDER_SITE_OTHER): Payer: PPO | Admitting: Nurse Practitioner

## 2022-02-09 ENCOUNTER — Other Ambulatory Visit: Payer: Self-pay | Admitting: Neurology

## 2022-02-09 DIAGNOSIS — R202 Paresthesia of skin: Secondary | ICD-10-CM

## 2022-02-18 ENCOUNTER — Ambulatory Visit
Admission: RE | Admit: 2022-02-18 | Discharge: 2022-02-18 | Disposition: A | Discharge status: Home / Self Care | Payer: PPO | Source: Ambulatory Visit | Attending: Neurology | Admitting: Neurology

## 2022-02-18 DIAGNOSIS — R202 Paresthesia of skin: Secondary | ICD-10-CM | POA: Insufficient documentation

## 2022-02-18 MED ORDER — GADOBUTROL 1 MMOL/ML IV SOLN
5.0000 mL | Freq: Once | INTRAVENOUS | Status: AC | PRN
Start: 2022-02-18 — End: 2022-02-18
  Administered 2022-02-18: 5 mL via INTRAVENOUS

## 2022-04-26 ENCOUNTER — Encounter (INDEPENDENT_AMBULATORY_CARE_PROVIDER_SITE_OTHER): Payer: Self-pay

## 2022-04-30 ENCOUNTER — Other Ambulatory Visit (INDEPENDENT_AMBULATORY_CARE_PROVIDER_SITE_OTHER): Payer: Self-pay | Admitting: Nurse Practitioner

## 2022-07-13 ENCOUNTER — Encounter: Payer: Self-pay | Admitting: Internal Medicine

## 2022-07-13 ENCOUNTER — Other Ambulatory Visit: Payer: Self-pay

## 2022-07-13 ENCOUNTER — Inpatient Hospital Stay
Admission: EM | Admit: 2022-07-13 | Discharge: 2022-07-15 | DRG: 190 | Disposition: A | Discharge status: Home / Self Care | Payer: PPO | Attending: Internal Medicine | Admitting: Internal Medicine

## 2022-07-13 ENCOUNTER — Inpatient Hospital Stay (HOSPITAL_COMMUNITY)
Admit: 2022-07-13 | Discharge: 2022-07-13 | Disposition: A | Discharge status: Home / Self Care | Payer: PPO | Attending: Internal Medicine | Admitting: Internal Medicine

## 2022-07-13 ENCOUNTER — Emergency Department: Payer: PPO

## 2022-07-13 DIAGNOSIS — F0394 Unspecified dementia, unspecified severity, with anxiety: Secondary | ICD-10-CM | POA: Diagnosis not present

## 2022-07-13 DIAGNOSIS — Z79899 Other long term (current) drug therapy: Secondary | ICD-10-CM

## 2022-07-13 DIAGNOSIS — I2510 Atherosclerotic heart disease of native coronary artery without angina pectoris: Secondary | ICD-10-CM | POA: Diagnosis not present

## 2022-07-13 DIAGNOSIS — F0393 Unspecified dementia, unspecified severity, with mood disturbance: Secondary | ICD-10-CM | POA: Diagnosis present

## 2022-07-13 DIAGNOSIS — M810 Age-related osteoporosis without current pathological fracture: Secondary | ICD-10-CM | POA: Diagnosis not present

## 2022-07-13 DIAGNOSIS — J9601 Acute respiratory failure with hypoxia: Secondary | ICD-10-CM | POA: Diagnosis present

## 2022-07-13 DIAGNOSIS — I5032 Chronic diastolic (congestive) heart failure: Secondary | ICD-10-CM | POA: Diagnosis not present

## 2022-07-13 DIAGNOSIS — I340 Nonrheumatic mitral (valve) insufficiency: Secondary | ICD-10-CM | POA: Diagnosis not present

## 2022-07-13 DIAGNOSIS — J9811 Atelectasis: Secondary | ICD-10-CM | POA: Diagnosis not present

## 2022-07-13 DIAGNOSIS — I252 Old myocardial infarction: Secondary | ICD-10-CM

## 2022-07-13 DIAGNOSIS — Z8249 Family history of ischemic heart disease and other diseases of the circulatory system: Secondary | ICD-10-CM | POA: Diagnosis not present

## 2022-07-13 DIAGNOSIS — R0902 Hypoxemia: Secondary | ICD-10-CM | POA: Diagnosis not present

## 2022-07-13 DIAGNOSIS — F418 Other specified anxiety disorders: Secondary | ICD-10-CM | POA: Diagnosis present

## 2022-07-13 DIAGNOSIS — R0602 Shortness of breath: Secondary | ICD-10-CM | POA: Diagnosis not present

## 2022-07-13 DIAGNOSIS — Z515 Encounter for palliative care: Secondary | ICD-10-CM

## 2022-07-13 DIAGNOSIS — I10 Essential (primary) hypertension: Secondary | ICD-10-CM | POA: Diagnosis not present

## 2022-07-13 DIAGNOSIS — R4182 Altered mental status, unspecified: Secondary | ICD-10-CM

## 2022-07-13 DIAGNOSIS — Z23 Encounter for immunization: Secondary | ICD-10-CM | POA: Diagnosis not present

## 2022-07-13 DIAGNOSIS — K7682 Hepatic encephalopathy: Secondary | ICD-10-CM | POA: Diagnosis not present

## 2022-07-13 DIAGNOSIS — Z1152 Encounter for screening for COVID-19: Secondary | ICD-10-CM

## 2022-07-13 DIAGNOSIS — R9431 Abnormal electrocardiogram [ECG] [EKG]: Secondary | ICD-10-CM | POA: Diagnosis not present

## 2022-07-13 DIAGNOSIS — J441 Chronic obstructive pulmonary disease with (acute) exacerbation: Principal | ICD-10-CM | POA: Diagnosis present

## 2022-07-13 DIAGNOSIS — Z880 Allergy status to penicillin: Secondary | ICD-10-CM | POA: Diagnosis not present

## 2022-07-13 DIAGNOSIS — E785 Hyperlipidemia, unspecified: Secondary | ICD-10-CM | POA: Diagnosis present

## 2022-07-13 DIAGNOSIS — I739 Peripheral vascular disease, unspecified: Secondary | ICD-10-CM | POA: Diagnosis not present

## 2022-07-13 DIAGNOSIS — F419 Anxiety disorder, unspecified: Secondary | ICD-10-CM | POA: Diagnosis present

## 2022-07-13 DIAGNOSIS — I5031 Acute diastolic (congestive) heart failure: Secondary | ICD-10-CM

## 2022-07-13 DIAGNOSIS — F17210 Nicotine dependence, cigarettes, uncomplicated: Secondary | ICD-10-CM | POA: Diagnosis not present

## 2022-07-13 DIAGNOSIS — Z833 Family history of diabetes mellitus: Secondary | ICD-10-CM

## 2022-07-13 DIAGNOSIS — R410 Disorientation, unspecified: Secondary | ICD-10-CM | POA: Diagnosis not present

## 2022-07-13 DIAGNOSIS — K7460 Unspecified cirrhosis of liver: Secondary | ICD-10-CM | POA: Diagnosis present

## 2022-07-13 DIAGNOSIS — G9341 Metabolic encephalopathy: Secondary | ICD-10-CM | POA: Diagnosis not present

## 2022-07-13 DIAGNOSIS — F32A Depression, unspecified: Secondary | ICD-10-CM | POA: Diagnosis present

## 2022-07-13 DIAGNOSIS — I5033 Acute on chronic diastolic (congestive) heart failure: Secondary | ICD-10-CM | POA: Diagnosis not present

## 2022-07-13 DIAGNOSIS — Z7902 Long term (current) use of antithrombotics/antiplatelets: Secondary | ICD-10-CM

## 2022-07-13 DIAGNOSIS — Z720 Tobacco use: Secondary | ICD-10-CM | POA: Diagnosis present

## 2022-07-13 DIAGNOSIS — I110 Hypertensive heart disease with heart failure: Secondary | ICD-10-CM | POA: Diagnosis not present

## 2022-07-13 DIAGNOSIS — I700 Atherosclerosis of aorta: Secondary | ICD-10-CM | POA: Diagnosis not present

## 2022-07-13 DIAGNOSIS — Z7951 Long term (current) use of inhaled steroids: Secondary | ICD-10-CM

## 2022-07-13 DIAGNOSIS — Z7982 Long term (current) use of aspirin: Secondary | ICD-10-CM

## 2022-07-13 LAB — RESP PANEL BY RT-PCR (RSV, FLU A&B, COVID)  RVPGX2
Influenza A by PCR: NEGATIVE
Influenza B by PCR: NEGATIVE
Resp Syncytial Virus by PCR: NEGATIVE
SARS Coronavirus 2 by RT PCR: NEGATIVE

## 2022-07-13 LAB — BLOOD GAS, VENOUS
Acid-Base Excess: 3.5 mmol/L — ABNORMAL HIGH (ref 0.0–2.0)
Bicarbonate: 30.8 mmol/L — ABNORMAL HIGH (ref 20.0–28.0)
O2 Saturation: 39.2 %
Patient temperature: 37
pCO2, Ven: 57 mmHg (ref 44–60)
pH, Ven: 7.34 (ref 7.25–7.43)
pO2, Ven: <31 mmHg — CL (ref 32–45)

## 2022-07-13 LAB — COMPREHENSIVE METABOLIC PANEL
ALT: 15 U/L (ref 0–44)
AST: 15 U/L (ref 15–41)
Albumin: 3.7 g/dL (ref 3.5–5.0)
Alkaline Phosphatase: 106 U/L (ref 38–126)
Anion gap: 6 (ref 5–15)
BUN: 17 mg/dL (ref 8–23)
CO2: 25 mmol/L (ref 22–32)
Calcium: 8.9 mg/dL (ref 8.9–10.3)
Chloride: 107 mmol/L (ref 98–111)
Creatinine, Ser: 1.01 mg/dL — ABNORMAL HIGH (ref 0.44–1.00)
GFR, Estimated: 59 mL/min — ABNORMAL LOW (ref >60)
Glucose, Bld: 90 mg/dL (ref 70–99)
Potassium: 4.5 mmol/L (ref 3.5–5.1)
Sodium: 138 mmol/L (ref 135–145)
Total Bilirubin: 0.8 mg/dL (ref 0.3–1.2)
Total Protein: 6.8 g/dL (ref 6.5–8.1)

## 2022-07-13 LAB — AMMONIA: Ammonia: 36 umol/L — ABNORMAL HIGH (ref 9–35)

## 2022-07-13 LAB — CBC
HCT: 47.2 % — ABNORMAL HIGH (ref 36.0–46.0)
Hemoglobin: 14.5 g/dL (ref 12.0–15.0)
MCH: 28.2 pg (ref 26.0–34.0)
MCHC: 30.7 g/dL (ref 30.0–36.0)
MCV: 91.8 fL (ref 80.0–100.0)
Platelets: 203 10*3/uL (ref 150–400)
RBC: 5.14 MIL/uL — ABNORMAL HIGH (ref 3.87–5.11)
RDW: 15.3 % (ref 11.5–15.5)
WBC: 9.8 10*3/uL (ref 4.0–10.5)
nRBC: 0 % (ref 0.0–0.2)

## 2022-07-13 LAB — BRAIN NATRIURETIC PEPTIDE: B Natriuretic Peptide: 109.3 pg/mL — ABNORMAL HIGH (ref 0.0–100.0)

## 2022-07-13 LAB — PROTIME-INR
INR: 1.1 (ref 0.8–1.2)
Prothrombin Time: 14 seconds (ref 11.4–15.2)

## 2022-07-13 LAB — APTT: aPTT: 43 seconds — ABNORMAL HIGH (ref 24–36)

## 2022-07-13 LAB — LACTIC ACID, PLASMA
Lactic Acid, Venous: 0.8 mmol/L (ref 0.5–1.9)
Lactic Acid, Venous: 1.1 mmol/L (ref 0.5–1.9)

## 2022-07-13 MED ORDER — ENOXAPARIN SODIUM 40 MG/0.4ML IJ SOSY
40.0000 mg | PREFILLED_SYRINGE | Freq: Every day | INTRAMUSCULAR | Status: DC
  Administered 2022-07-13 – 2022-07-14 (×2): 40 mg via SUBCUTANEOUS
  Filled 2022-07-13 (×2): qty 0.4

## 2022-07-13 MED ORDER — INFLUENZA VAC A&B SA ADJ QUAD 0.5 ML IM PRSY
0.5000 mL | PREFILLED_SYRINGE | INTRAMUSCULAR | Status: AC
  Administered 2022-07-14: 0.5 mL via INTRAMUSCULAR
  Filled 2022-07-13: qty 0.5

## 2022-07-13 MED ORDER — MAGNESIUM OXIDE -MG SUPPLEMENT 400 (240 MG) MG PO TABS
400.0000 mg | ORAL_TABLET | Freq: Every day | ORAL | Status: DC
  Administered 2022-07-14 – 2022-07-15 (×2): 400 mg via ORAL
  Filled 2022-07-13 (×2): qty 1

## 2022-07-13 MED ORDER — NICOTINE 21 MG/24HR TD PT24
21.0000 mg | MEDICATED_PATCH | Freq: Every day | TRANSDERMAL | Status: DC
  Administered 2022-07-13 – 2022-07-15 (×3): 21 mg via TRANSDERMAL
  Filled 2022-07-13 (×3): qty 1

## 2022-07-13 MED ORDER — MAGNESIUM 400 MG PO CAPS: 1.0000 | ORAL_CAPSULE | Freq: Every day | ORAL | Status: DC

## 2022-07-13 MED ORDER — ALBUTEROL SULFATE (2.5 MG/3ML) 0.083% IN NEBU: 2.5000 mg | INHALATION_SOLUTION | RESPIRATORY_TRACT | Status: DC | PRN

## 2022-07-13 MED ORDER — CLOPIDOGREL BISULFATE 75 MG PO TABS
75.0000 mg | ORAL_TABLET | Freq: Every day | ORAL | Status: DC
  Administered 2022-07-13 – 2022-07-15 (×3): 75 mg via ORAL
  Filled 2022-07-13 (×3): qty 1

## 2022-07-13 MED ORDER — AZITHROMYCIN 250 MG PO TABS
250.0000 mg | ORAL_TABLET | Freq: Every day | ORAL | Status: DC
  Administered 2022-07-14 – 2022-07-15 (×2): 250 mg via ORAL
  Filled 2022-07-13 (×2): qty 1

## 2022-07-13 MED ORDER — DM-GUAIFENESIN ER 30-600 MG PO TB12
1.0000 | ORAL_TABLET | Freq: Two times a day (BID) | ORAL | Status: DC | PRN
  Administered 2022-07-13 – 2022-07-14 (×2): 1 via ORAL
  Filled 2022-07-13 (×2): qty 1

## 2022-07-13 MED ORDER — HYDRALAZINE HCL 20 MG/ML IJ SOLN: 5.0000 mg | INTRAMUSCULAR | Status: DC | PRN

## 2022-07-13 MED ORDER — METHYLPREDNISOLONE SODIUM SUCC 125 MG IJ SOLR: 125.0000 mg | Freq: Two times a day (BID) | INTRAMUSCULAR | Status: DC

## 2022-07-13 MED ORDER — LACTULOSE 10 GM/15ML PO SOLN
20.0000 g | Freq: Two times a day (BID) | ORAL | Status: DC
  Administered 2022-07-13 – 2022-07-15 (×3): 20 g via ORAL
  Filled 2022-07-13 (×4): qty 30

## 2022-07-13 MED ORDER — FUROSEMIDE 10 MG/ML IJ SOLN
20.0000 mg | Freq: Two times a day (BID) | INTRAMUSCULAR | Status: DC
  Administered 2022-07-13 – 2022-07-15 (×4): 20 mg via INTRAVENOUS
  Filled 2022-07-13 (×4): qty 4

## 2022-07-13 MED ORDER — METHYLPREDNISOLONE SODIUM SUCC 40 MG IJ SOLR
40.0000 mg | Freq: Two times a day (BID) | INTRAMUSCULAR | Status: DC
  Administered 2022-07-14 – 2022-07-15 (×3): 40 mg via INTRAVENOUS
  Filled 2022-07-13 (×3): qty 1

## 2022-07-13 MED ORDER — PREGABALIN 75 MG PO CAPS
150.0000 mg | ORAL_CAPSULE | Freq: Three times a day (TID) | ORAL | Status: DC
  Administered 2022-07-13 – 2022-07-15 (×5): 150 mg via ORAL
  Filled 2022-07-13 (×5): qty 2

## 2022-07-13 MED ORDER — IPRATROPIUM-ALBUTEROL 0.5-2.5 (3) MG/3ML IN SOLN
3.0000 mL | Freq: Once | RESPIRATORY_TRACT | Status: AC
  Administered 2022-07-13: 3 mL via RESPIRATORY_TRACT
  Filled 2022-07-13: qty 3

## 2022-07-13 MED ORDER — IPRATROPIUM-ALBUTEROL 0.5-2.5 (3) MG/3ML IN SOLN
3.0000 mL | RESPIRATORY_TRACT | Status: DC
  Administered 2022-07-13: 3 mL via RESPIRATORY_TRACT
  Filled 2022-07-13: qty 3

## 2022-07-13 MED ORDER — ATORVASTATIN CALCIUM 20 MG PO TABS
40.0000 mg | ORAL_TABLET | Freq: Every day | ORAL | Status: DC
  Administered 2022-07-13 – 2022-07-14 (×2): 40 mg via ORAL
  Filled 2022-07-13 (×2): qty 2

## 2022-07-13 MED ORDER — METHYLPREDNISOLONE SODIUM SUCC 125 MG IJ SOLR
125.0000 mg | Freq: Once | INTRAMUSCULAR | Status: AC
  Administered 2022-07-13: 125 mg via INTRAVENOUS
  Filled 2022-07-13: qty 2

## 2022-07-13 MED ORDER — SODIUM CHLORIDE 0.9 % IV SOLN
500.0000 mg | Freq: Once | INTRAVENOUS | Status: AC
  Administered 2022-07-13: 500 mg via INTRAVENOUS
  Filled 2022-07-13: qty 5

## 2022-07-13 MED ORDER — HYDROCODONE-ACETAMINOPHEN 5-325 MG PO TABS
1.0000 | ORAL_TABLET | Freq: Two times a day (BID) | ORAL | Status: DC | PRN
  Administered 2022-07-13 – 2022-07-14 (×2): 1 via ORAL
  Filled 2022-07-13 (×2): qty 1

## 2022-07-13 MED ORDER — ASPIRIN 81 MG PO TBEC
81.0000 mg | DELAYED_RELEASE_TABLET | Freq: Every day | ORAL | Status: DC
  Administered 2022-07-14 – 2022-07-15 (×2): 81 mg via ORAL
  Filled 2022-07-13 (×2): qty 1

## 2022-07-13 MED ORDER — GABAPENTIN 300 MG PO CAPS
600.0000 mg | ORAL_CAPSULE | Freq: Every day | ORAL | Status: DC
  Administered 2022-07-13 – 2022-07-14 (×2): 600 mg via ORAL
  Filled 2022-07-13 (×2): qty 2

## 2022-07-13 MED ORDER — PNEUMOCOCCAL 20-VAL CONJ VACC 0.5 ML IM SUSY
0.5000 mL | PREFILLED_SYRINGE | INTRAMUSCULAR | Status: AC
  Administered 2022-07-14: 0.5 mL via INTRAMUSCULAR
  Filled 2022-07-13 (×2): qty 0.5

## 2022-07-13 MED ORDER — ONDANSETRON HCL 4 MG/2ML IJ SOLN: 4.0000 mg | Freq: Three times a day (TID) | INTRAMUSCULAR | Status: DC | PRN

## 2022-07-13 MED ORDER — VITAMIN B-12 1000 MCG PO TABS
1000.0000 ug | ORAL_TABLET | Freq: Every day | ORAL | Status: DC
  Administered 2022-07-14 – 2022-07-15 (×2): 1000 ug via ORAL
  Filled 2022-07-13 (×2): qty 1

## 2022-07-13 MED ORDER — VITAMIN E 180 MG (400 UNIT) PO CAPS
400.0000 [IU] | ORAL_CAPSULE | Freq: Every day | ORAL | Status: DC
  Administered 2022-07-14 – 2022-07-15 (×2): 400 [IU] via ORAL
  Filled 2022-07-13 (×2): qty 1

## 2022-07-13 MED ORDER — ACETAMINOPHEN 325 MG PO TABS: 650.0000 mg | ORAL_TABLET | Freq: Four times a day (QID) | ORAL | Status: DC | PRN

## 2022-07-13 MED ORDER — AMITRIPTYLINE HCL 100 MG PO TABS
100.0000 mg | ORAL_TABLET | Freq: Every day | ORAL | Status: DC
  Administered 2022-07-13 – 2022-07-14 (×2): 100 mg via ORAL
  Filled 2022-07-13 (×3): qty 1

## 2022-07-13 NOTE — H&P (Addendum)
History and Physical    Wendy Arias GDJ:[id number] DOB: 1950/10/25 DOA: 07/13/2022  Referring MD/NP/PA:   PCP: Donnamarie Rossetti, PA-C   Patient coming from:  The patient is coming from home.     Chief Complaint: Shortness of breath and confusion  HPI: Wendy Arias is a 72 y.o. female with medical history significant of hypertension, hyperlipidemia, COPD, PVD, CAD, stent placement, diastolic CHF, liver cirrhosis, dementia with anxiety, who presents with shortness breath and confusion.  Per her sister at the bedside, patient has been intermittently confused for more than 1 week.  She has dry cough, shortness breath which has been progressively worsening.  She does not seem to have chest pain, no fever or chills.  Patient does not have active nausea, vomiting, diarrhea or abdominal pain.  Did not complains of symptoms of UTI.  Normally patient is alert oriented x 3.  Currently patient is confused, orientated to the person and place, not to time.  She moves all extremities.  No facial droop or slurred speech.  Data reviewed independently and ED Course: pt was found to have WBC 9.8, creatinine 1.01, BUN 17, GFR 59, temperature normal, blood pressure 119/82, heart rate 87, RR 18, oxygen saturation 85% on room air, which improved to 90 to 95% on 3 L oxygen.  Chest x-ray showed low volume and vascular congestion.  Patient is admitted to telemetry bed as inpatient   EKG: I have personally reviewed.  Sinus rhythm, QTc 476, low voltage, anteroseptal infarction pattern.   Review of Systems: Could not be reviewed due to altered mental status.    Allergy:  Allergies  Allergen Reactions   Penicillins Other (See Comments)    Has patient had a PCN reaction causing immediate rash, facial/tongue/throat swelling, SOB or lightheadedness with hypotension: Yes Has patient had a PCN reaction causing severe rash involving mucus membranes or skin necrosis: Unknown Has patient had a PCN reaction that  required hospitalization: Unknown Has patient had a PCN reaction occurring within the last 10 years: Unknown If all of the above answers are "NO", then may proceed with Cephalosporin use. Pt has taken augmentin before    Past Medical History:  Diagnosis Date   (HFpEF) heart failure with preserved ejection fraction ([place]) 06/07/2004   a.) TTE 06/07/2004: EF 50-55%; inf HK. b.) TTE 04/17/2018: EF 45%; GLS -14.6%; inferoseptal and inferoposterior HK; sev LA enlargement; mild PR, mod TR, sev MR; G2DD. c.) TTE 12/05/2018: EF >55%, mild LVH; mild MR. d.) TTE 12/18/2020: EF >55%; mild LA enlargement; triv TR/PR, mod MR; G1DD.   Anxiety    Aortic atherosclerosis (HCC)    Asthma    CAD (coronary artery disease) 06/08/2004   a.) NSTEMI 06/05/2004. b.) LHC 06/08/2004: EF 70%; 50% mLAD, 50% dLAD, 99% OM3, 50% pRCA, 50% mRCA; PCI to OM3 placing a DES x 1 (unknown type). c.) Lexi scan 09/14/2021: normal LV function with no ischemia or scar.   COPD (chronic obstructive pulmonary disease) (HCC)    Depression    Diverticulosis    DOE (dyspnea on exertion)    Empyema of right pleural space (HCC) 02/16/2018   HLD (hyperlipidemia)    Hypertension    IDA (iron deficiency anemia)    Ischemic cardiomyopathy    NSTEMI (non-ST elevated myocardial infarction) ([place]) 06/05/2004   a.)  PCI on 06/08/2004 placing a DES x1 (unknown type) to 99% lesion in OM3.   Osteoporosis    Peripheral vascular disease (HCC)    PONV (postoperative nausea  and vomiting)     Past Surgical History:  Procedure Laterality Date   COLONOSCOPY WITH PROPOFOL N/A 04/12/2017   Procedure: COLONOSCOPY WITH PROPOFOL;  Surgeon: Lollie Sails, MD;  Location: [facility] ENDOSCOPY;  Service: Endoscopy;  Laterality: N/A;   CORONARY ANGIOPLASTY WITH STENT PLACEMENT Left 06/08/2004   Procedure: CORONARY ANGIOPLASTY WITH STENT PLACEMENT; Location: [place]; Surgeon: Katrine Coho, MD   ENDARTERECTOMY FEMORAL Bilateral 09/16/2021   Procedure:  ENDARTERECTOMY FEMORAL;  Surgeon: Algernon Huxley, MD;  Location: [facility] ORS;  Service: Vascular;  Laterality: Bilateral;   ENDOBRONCHIAL ULTRASOUND N/A 11/28/2017   Procedure: ENDOBRONCHIAL ULTRASOUND;  Surgeon: Laverle Hobby, MD;  Location: [facility] ORS;  Service: Pulmonary;  Laterality: N/A;   INSERTION OF ILIAC STENT Bilateral 09/16/2021   Procedure: INSERTION OF ILIAC STENT;  Surgeon: Algernon Huxley, MD;  Location: [facility] ORS;  Service: Vascular;  Laterality: Bilateral;   LOWER EXTREMITY ANGIOGRAPHY Right 08/12/2021   Procedure: Lower Extremity Angiography;  Surgeon: Algernon Huxley, MD;  Location: [place] CV LAB;  Service: Cardiovascular;  Laterality: Right;   PARTIAL HYSTERECTOMY N/A    REDUCTION MAMMAPLASTY     VIDEO ASSISTED THORACOSCOPY (VATS)/THOROCOTOMY Right 03/2018   Procedure: VIDEO ASSISTED THORACOSCOPY (VATS)/THOROCOTOMY (partial decortication, loculated empyema); Location: [facility]    Social History:  reports that she has been smoking cigarettes. She has a 10.00 pack-year smoking history. She has never used smokeless tobacco. She reports that she does not drink alcohol and does not use drugs.  Family History:  Family History  Problem Relation Age of Onset   Breast cancer Mother 46   Hypertension Mother    Dementia Mother    Breast cancer Maternal Aunt 3   Breast cancer Other 45   Hypertension Father    Heart disease Father    Diabetes Father    Diabetes Sister      Prior to Admission medications   Medication Sig Start Date End Date Taking? Authorizing Provider  albuterol (PROVENTIL HFA;VENTOLIN HFA) 108 (90 Base) MCG/ACT inhaler Inhale into the lungs every 6 (six) hours as needed for wheezing or shortness of breath.    [provider]  aspirin EC 81 MG tablet Take 1 tablet (81 mg total) by mouth daily. 08/12/21   Algernon Huxley, MD  atorvastatin (LIPITOR) 40 MG tablet Take 40 mg by mouth daily. 05/18/21   [provider]  clopidogrel (PLAVIX) 75 MG tablet  TAKE 1 TABLET BY MOUTH DAILY AT 6AM 04/30/22   Kris Hartmann, NP  gabapentin (NEURONTIN) 300 MG capsule Take 2 capsules (600 mg total) by mouth at bedtime. 10/29/21   Kris Hartmann, NP  Magnesium 400 MG CAPS Take 1 tablet by mouth daily.    [provider]  metoprolol succinate (TOPROL-XL) 25 MG 24 hr tablet Take 25 mg by mouth daily. 09/15/20 10/29/21  [provider]  oxyCODONE-acetaminophen (PERCOCET) 5-325 MG tablet Take 1 tablet by mouth every 8 (eight) hours as needed for severe pain. 12/01/21   Kris Hartmann, NP  TRELEGY ELLIPTA 100-62.5-25 MCG/ACT AEPB Inhale 1 puff into the lungs daily. 05/30/21   Fritzi Mandes, MD  triamcinolone cream (KENALOG) 0.1 % Apply topically 2 (two) times daily. 07/30/21   [provider]  triamcinolone cream (KENALOG) 0.1 % Apply topically 2 (two) times daily. 07/30/21 07/30/22  [provider]  vitamin B-12 (CYANOCOBALAMIN) 1000 MCG tablet Take 1,000 mcg by mouth daily.    [provider]  Vitamin E 268 MG (400 UNIT) CAPS Take 400  Units by mouth daily.    [provider]    Physical Exam: Vitals:   07/13/22 1456 07/13/22 1621 07/13/22 1700 07/13/22 1730  BP:  93/67 101/75 114/85  Pulse:  82 81 81  Resp:  '14 16 14  '$ Temp:  98 F (36.7 C)    TempSrc:      SpO2: 90% 94% 95% 93%  Weight:       General: Not in acute distress HEENT:       Eyes: PERRL, EOMI, no scleral icterus.       ENT: No discharge from the ears and nose, no pharynx injection, no tonsillar enlargement.        Neck: positive JVD, no bruit, no mass felt. Heme: No neck lymph node enlargement. Cardiac: S1/S2, RRR, No murmurs, No gallops or rubs. Respiratory: Has rhonchi and crackles bilaterally.  Crackles is worse on the right side GI: Soft, nondistended, nontender, no organomegaly, BS present. GU: No hematuria Ext: Has trace leg edema bilaterally. 1+DP/PT pulse bilaterally. Musculoskeletal: No joint deformities, No joint redness or warmth, no  limitation of ROM in spin. Skin: No rashes.  Neuro: Alert, oriented X3, cranial nerves II-XII grossly intact, moves all extremities normally.  Psych: Patient is not psychotic, no suicidal or hemocidal ideation.  Labs on Admission: I have personally reviewed following labs and imaging studies  CBC: Recent Labs  Lab 07/13/22 1450  WBC 9.8  HGB 14.5  HCT 47.2*  MCV 91.8  PLT 836   Basic Metabolic Panel: Recent Labs  Lab 07/13/22 1450  NA 138  K 4.5  CL 107  CO2 25  GLUCOSE 90  BUN 17  CREATININE 1.01*  CALCIUM 8.9   GFR: CrCl cannot be calculated (Unknown ideal weight.). Liver Function Tests: Recent Labs  Lab 07/13/22 1450  AST 15  ALT 15  ALKPHOS 106  BILITOT 0.8  PROT 6.8  ALBUMIN 3.7   No results for input(s): "LIPASE", "AMYLASE" in the last 168 hours. No results for input(s): "AMMONIA" in the last 168 hours. Coagulation Profile: No results for input(s): "INR", "PROTIME" in the last 168 hours. Cardiac Enzymes: No results for input(s): "CKTOTAL", "CKMB", "CKMBINDEX", "TROPONINI" in the last 168 hours. BNP (last 3 results) No results for input(s): "PROBNP" in the last 8760 hours. HbA1C: No results for input(s): "HGBA1C" in the last 72 hours. CBG: No results for input(s): "GLUCAP" in the last 168 hours. Lipid Profile: No results for input(s): "CHOL", "HDL", "LDLCALC", "TRIG", "CHOLHDL", "LDLDIRECT" in the last 72 hours. Thyroid Function Tests: No results for input(s): "TSH", "T4TOTAL", "FREET4", "T3FREE", "THYROIDAB" in the last 72 hours. Anemia Panel: No results for input(s): "VITAMINB12", "FOLATE", "FERRITIN", "TIBC", "IRON", "RETICCTPCT" in the last 72 hours. Urine analysis:    Component Value Date/Time   COLORURINE YELLOW (A) 04/13/2018 1338   APPEARANCEUR CLEAR (A) 04/13/2018 1338   LABSPEC 1.034 (H) 04/13/2018 1338   PHURINE 5.0 04/13/2018 1338   GLUCOSEU NEGATIVE 04/13/2018 1338   HGBUR NEGATIVE 04/13/2018 1338   BILIRUBINUR NEGATIVE  04/13/2018 1338   KETONESUR 5 (A) 04/13/2018 1338   PROTEINUR 100 (A) 04/13/2018 1338   NITRITE NEGATIVE 04/13/2018 1338   LEUKOCYTESUR NEGATIVE 04/13/2018 1338   Sepsis Labs: '@LABRCNTIP'$ (procalcitonin:4,lacticidven:4) ) Recent Results (from the past 240 hour(s))  Resp panel by RT-PCR (RSV, Flu A&B, Covid) Anterior Nasal Swab     Status: None   Collection Time: 07/13/22  2:55 PM   Specimen: Anterior Nasal Swab  Result Value Ref Range Status   SARS Coronavirus  2 by RT PCR NEGATIVE NEGATIVE Final    Comment: (NOTE) SARS-CoV-2 target nucleic acids are NOT DETECTED.  The SARS-CoV-2 RNA is generally detectable in upper respiratory specimens during the acute phase of infection. The lowest concentration of SARS-CoV-2 viral copies this assay can detect is 138 copies/mL. A negative result does not preclude SARS-Cov-2 infection and should not be used as the sole basis for treatment or other patient management decisions. A negative result may occur with  improper specimen collection/handling, submission of specimen other than nasopharyngeal swab, presence of viral mutation(s) within the areas targeted by this assay, and inadequate number of viral copies(<138 copies/mL). A negative result must be combined with clinical observations, patient history, and epidemiological information. The expected result is Negative.  Fact Sheet for Patients:  EntrepreneurPulse.com.au  Fact Sheet for Healthcare Providers:  IncredibleEmployment.be  This test is no t yet approved or cleared by the [place] FDA and  has been authorized for detection and/or diagnosis of SARS-CoV-2 by FDA under an Emergency Use Authorization (EUA). This EUA will remain  in effect (meaning this test can be used) for the duration of the COVID-19 declaration under Section 564(b)(1) of the Act, 21 U.S.C.section 360bbb-3(b)(1), unless the authorization is terminated  or revoked sooner.        Influenza A by PCR NEGATIVE NEGATIVE Final   Influenza B by PCR NEGATIVE NEGATIVE Final    Comment: (NOTE) The Xpert Xpress SARS-CoV-2/FLU/RSV plus assay is intended as an aid in the diagnosis of influenza from Nasopharyngeal swab specimens and should not be used as a sole basis for treatment. Nasal washings and aspirates are unacceptable for Xpert Xpress SARS-CoV-2/FLU/RSV testing.  Fact Sheet for Patients: EntrepreneurPulse.com.au  Fact Sheet for Healthcare Providers: IncredibleEmployment.be  This test is not yet approved or cleared by the [place] FDA and has been authorized for detection and/or diagnosis of SARS-CoV-2 by FDA under an Emergency Use Authorization (EUA). This EUA will remain in effect (meaning this test can be used) for the duration of the COVID-19 declaration under Section 564(b)(1) of the Act, 21 U.S.C. section 360bbb-3(b)(1), unless the authorization is terminated or revoked.     Resp Syncytial Virus by PCR NEGATIVE NEGATIVE Final    Comment: (NOTE) Fact Sheet for Patients: EntrepreneurPulse.com.au  Fact Sheet for Healthcare Providers: IncredibleEmployment.be  This test is not yet approved or cleared by the [place] FDA and has been authorized for detection and/or diagnosis of SARS-CoV-2 by FDA under an Emergency Use Authorization (EUA). This EUA will remain in effect (meaning this test can be used) for the duration of the COVID-19 declaration under Section 564(b)(1) of the Act, 21 U.S.C. section 360bbb-3(b)(1), unless the authorization is terminated or revoked.  Performed at [facility], [address] [place], [place] [address]      Radiological Exams on Admission: CT Head Wo Contrast  Result Date: 07/13/2022 CLINICAL DATA:  Mental status change with unknown cause. EXAM: CT HEAD WITHOUT CONTRAST TECHNIQUE: Contiguous axial images were obtained from the  base of the skull through the vertex without intravenous contrast. RADIATION DOSE REDUCTION: This exam was performed according to the departmental dose-optimization program which includes automated exposure control, adjustment of the mA and/or kV according to patient size and/or use of iterative reconstruction technique. COMPARISON:  04/14/2018 FINDINGS: Brain: No evidence of acute infarction, hemorrhage, hydrocephalus, extra-axial collection or mass lesion/mass effect. Mild cerebral atrophy. Vascular: No hyperdense vessel or unexpected calcification. Skull: Normal. Negative for fracture or focal lesion. Sinuses/Orbits: No acute finding.  Other: None. IMPRESSION: No acute intracranial abnormalities. Electronically Signed   By: Lucienne Capers M.D.   On: 07/13/2022 16:09   DG Chest 2 View  Result Date: 07/13/2022 CLINICAL DATA:  Shortness of breath and confusion. EXAM: CHEST - 2 VIEW COMPARISON:  05/26/2021 FINDINGS: The cardiac silhouette, mediastinal and hilar contours are within normal limits and stable. Stable tortuosity and calcification of the thoracic aorta. Low lung volumes with vascular crowding and streaky basilar atelectasis but no infiltrates, edema or effusions. The bony thorax is intact. Remote L1 compression fracture noted. IMPRESSION: Low lung volumes with vascular crowding and streaky basilar atelectasis. Electronically Signed   By: Marijo Sanes M.D.   On: 07/13/2022 15:29      Assessment/Plan Principal Problem:   COPD exacerbation (HCC) Active Problems:   Acute on chronic diastolic CHF (congestive heart failure) (HCC)   Acute metabolic encephalopathy   Hepatic cirrhosis (HCC)   Essential hypertension   HLD (hyperlipidemia)   CAD (coronary artery disease)   Depression with anxiety   Tobacco abuse   Assessment and Plan:  COPD exacerbation ([place]) and acute on chronic diastolic CHF: Patient seems to have  combination of COPD and CHF exacerbation.  Patient has rhonchi on  auscultation, indicating possible COPD exacerbation.  He has trace leg edema, but has positive JVD, vascular congestion on chest x-ray, indicating possible CHF exacerbation.  Pending BNP.  -Will admit to tele bed as inpatient - treat CHF exacerbation as below: 2D echo on 12/18/2020 showed EF> 55%. -Lasix 20 mg bid by IV -2d echo -Daily weights -strict I/O's -Low salt diet -Fluid restriction -Obtain REDs Vest reading  -Treat COPD exacerbation as below: -Bronchodilators -Solu-Medrol 40 mg IV bid -Azithromycin -Mucinex for cough  -Incentive spirometry -sputum culture -Nasal cannula oxygen as needed to maintain O2 saturation 93% or greater  Acute metabolic encephalopathy: CT head negative for acute intracranial abnormality.  Suspect hepatic encephalopathy -Started lactulose 20 g twice daily empirically -Follow-up ammonia level -Frequent neurocheck -Fall precaution  Hepatic cirrhosis ([place]) -Check ammonia level -Check INR/PTT  Essential hypertension -IV hydralazine as needed -Patient is on IV Lasix  HLD (hyperlipidemia) -Lipitor  CAD (coronary artery disease) -Aspirin, Plavix, Lipitor  Depression with anxiety -Amitriptyline  Tobacco abuse -Nicotine patch    DVT ppx: sQ Lovenox  Code Status: Full code per her sister  Family Communication:      Yes, patient's sister at bed side.        Disposition Plan:  Anticipate discharge back to previous environment  Consults called:  none  Admission status and Level of care: Telemetry Medical:   as inpt       Dispo: The patient is from: Home              Anticipated d/c is to: Home              Anticipated d/c date is: 2 days              Patient currently is not medically stable to d/c.    Severity of Illness:  The appropriate patient status for this patient is INPATIENT. Inpatient status is judged to be reasonable and necessary in order to provide the required intensity of service to ensure the patient's safety.  The patient's presenting symptoms, physical exam findings, and initial radiographic and laboratory data in the context of their chronic comorbidities is felt to place them at high risk for further clinical deterioration. Furthermore, it is not anticipated that the patient will be  medically stable for discharge from the hospital within 2 midnights of admission.   * I certify that at the point of admission it is my clinical judgment that the patient will require inpatient hospital care spanning beyond 2 midnights from the point of admission due to high intensity of service, high risk for further deterioration and high frequency of surveillance required.*       Date of Service 07/13/2022    Ivor Costa [facility]   If 7PM-7AM, please contact night-coverage [email or web address] 07/13/2022, 7:41 PM

## 2022-07-13 NOTE — ED Notes (Signed)
Pt to ct 

## 2022-07-13 NOTE — ED Triage Notes (Signed)
First nurse note: Pt sent ot ED by [facility]. Pt was seen for cold symptoms x2 wks. Pt has hx COPD. Pt oxygen 79-80% on RA with acute AMS. [place] reports crackles in RLL. Pt was brought over without oxygen administered.

## 2022-07-13 NOTE — ED Triage Notes (Signed)
Pt was over at [facility] for AMS and low oxygen levels. Pt has lower right base crackles and is A/Ox1.

## 2022-07-13 NOTE — ED Notes (Signed)
Xray will bring pt to room. Primary RN made aware

## 2022-07-13 NOTE — ED Provider Notes (Signed)
[facility] Provider Note   Event Date/Time   First MD Initiated Contact with Patient 07/13/22 1512     (approximate) History  Altered Mental Status  HPI Wendy Arias is a 72 y.o. female with a stated past medical history of COPD who presents via caregiver after being seen in urgent care today and told to present to the emergency department for shortness of breath with altered mental status.  Patient's caregiver at bedside states that she has been dealing with an upper respiratory infection over the past few days however when she called her this morning she found her to be acutely altered and not making any sense before taking her to urgent care for further evaluation.  Patient was found in urgent care to have oxygen saturation in the 79-80% range on room air and was therefore placed on 2 L nasal cannula.  They did note Rales to right lower lung field ROS: Patient currently denies any vision changes, tinnitus, difficulty speaking, facial droop, sore throat, chest pain, abdominal pain, nausea/vomiting/diarrhea, dysuria, or numbness/paresthesias in any extremity   Physical Exam  Triage Vital Signs: ED Triage Vitals  Enc Vitals Group     BP 07/13/22 1443 119/82     Pulse Rate 07/13/22 1443 87     Resp 07/13/22 1443 18     Temp 07/13/22 1443 98.1 F (36.7 C)     Temp Source 07/13/22 1443 Oral     SpO2 07/13/22 1443 (!) 85 %     Weight 07/13/22 1444 135 lb (61.2 kg)     Height --      Head Circumference --      Peak Flow --      Pain Score 07/13/22 1444 0     Pain Loc --      Pain Edu? --      Excl. in [place]? --    Most recent vital signs: Vitals:   07/13/22 1443 07/13/22 1456  BP: 119/82   Pulse: 87   Resp: 18   Temp: 98.1 F (36.7 C)   SpO2: (!) 85% 90%   General: Awake, oriented x4. CV:  Good peripheral perfusion.  Resp:  Normal effort.  Expiratory wheezes over bilateral lung fields Abd:  No distention.  Other:  Elderly Caucasian female laying in bed  in no acute distress ED Results / Procedures / Treatments  Labs (all labs ordered are listed, but only abnormal results are displayed) Labs Reviewed  COMPREHENSIVE METABOLIC PANEL - Abnormal; Notable for the following components:      Result Value   Creatinine, Ser 1.01 (*)    GFR, Estimated 59 (*)    All other components within normal limits  CBC - Abnormal; Notable for the following components:   RBC 5.14 (*)    HCT 47.2 (*)    All other components within normal limits  RESP PANEL BY RT-PCR (RSV, FLU A&B, COVID)  [id number]  LACTIC ACID, PLASMA  LACTIC ACID, PLASMA  BLOOD GAS, VENOUS  CBG MONITORING, ED   EKG ED ECG REPORT I, Naaman Plummer, the attending physician, personally viewed and interpreted this ECG. Date: 07/13/2022 EKG Time: 1628 Rate: 85 Rhythm: normal sinus rhythm QRS Axis: normal Intervals: normal ST/T Wave abnormalities: normal Narrative Interpretation: no evidence of acute ischemia RADIOLOGY ED MD interpretation: 2 view chest x-ray interpreted independently by me shows low lung volumes with vascular crowding and streaky basilar atelectasis -Agree with radiology assessment Official radiology report(s): DG Chest 2 View  Result Date: 07/13/2022 CLINICAL DATA:  Shortness of breath and confusion. EXAM: CHEST - 2 VIEW COMPARISON:  05/26/2021 FINDINGS: The cardiac silhouette, mediastinal and hilar contours are within normal limits and stable. Stable tortuosity and calcification of the thoracic aorta. Low lung volumes with vascular crowding and streaky basilar atelectasis but no infiltrates, edema or effusions. The bony thorax is intact. Remote L1 compression fracture noted. IMPRESSION: Low lung volumes with vascular crowding and streaky basilar atelectasis. Electronically Signed   By: Marijo Sanes M.D.   On: 07/13/2022 15:29   PROCEDURES: Critical Care performed: Yes, see critical care procedure note(s) .1-3 Lead EKG Interpretation  Performed by: Naaman Plummer,  MD Authorized by: Naaman Plummer, MD     Interpretation: normal     ECG rate:  71   ECG rate assessment: normal     Rhythm: sinus rhythm     Ectopy: none     Conduction: normal   CRITICAL CARE Performed by: Naaman Plummer  Total critical care time: 33 minutes  Critical care time was exclusive of separately billable procedures and treating other patients.  Critical care was necessary to treat or prevent imminent or life-threatening deterioration.  Critical care was time spent personally by me on the following activities: development of treatment plan with patient and/or surrogate as well as nursing, discussions with consultants, evaluation of patient's response to treatment, examination of patient, obtaining history from patient or surrogate, ordering and performing treatments and interventions, ordering and review of laboratory studies, ordering and review of radiographic studies, pulse oximetry and re-evaluation of patient's condition.  MEDICATIONS ORDERED IN ED: Medications  ipratropium-albuterol (DUONEB) 0.5-2.5 (3) MG/3ML nebulizer solution 3 mL (has no administration in time range)  methylPREDNISolone sodium succinate (SOLU-MEDROL) 125 mg/2 mL injection 125 mg (has no administration in time range)   IMPRESSION / MDM / ASSESSMENT AND PLAN / ED COURSE  I reviewed the triage vital signs and the nursing notes.                             The patient is on the cardiac monitor to evaluate for evidence of arrhythmia and/or significant heart rate changes. Patient's presentation is most consistent with acute presentation with potential threat to life or bodily function. The patient appears to be suffering from a moderate/severe exacerbation of COPD.  Based on the history, exam, CXR/EKG reviewed by me, and further workup I dont suspect any other emergent cause of this presentation, such as pneumonia, acute coronary syndrome, congestive heart failure, pulmonary embolism, or  pneumothorax.  ED Interventions: bronchodilators, steroids, antibiotics, reassess  Reassessment: After treatment, the patients shortness of breath is improving but patient is still requiring supplemental oxygenation   Disposition: Admit   FINAL CLINICAL IMPRESSION(S) / ED DIAGNOSES   Final diagnoses:  Acute respiratory failure with hypoxia (HCC)  COPD exacerbation (HCC)  Altered mental status, unspecified altered mental status type   Rx / DC Orders   ED Discharge Orders     None      Note:  This document was prepared using Dragon voice recognition software and may include unintentional dictation errors.   Naaman Plummer, MD 07/13/22 [phone]

## 2022-07-14 DIAGNOSIS — J441 Chronic obstructive pulmonary disease with (acute) exacerbation: Secondary | ICD-10-CM | POA: Diagnosis not present

## 2022-07-14 DIAGNOSIS — I5033 Acute on chronic diastolic (congestive) heart failure: Secondary | ICD-10-CM

## 2022-07-14 DIAGNOSIS — G9341 Metabolic encephalopathy: Secondary | ICD-10-CM

## 2022-07-14 DIAGNOSIS — F418 Other specified anxiety disorders: Secondary | ICD-10-CM

## 2022-07-14 LAB — ECHOCARDIOGRAM COMPLETE
Area-P 1/2: 3.72 cm2
S' Lateral: 2.7 cm
Weight: 2160 oz

## 2022-07-14 LAB — CBC
HCT: 43 % (ref 36.0–46.0)
Hemoglobin: 13.5 g/dL (ref 12.0–15.0)
MCH: 27.7 pg (ref 26.0–34.0)
MCHC: 31.4 g/dL (ref 30.0–36.0)
MCV: 88.1 fL (ref 80.0–100.0)
Platelets: 181 10*3/uL (ref 150–400)
RBC: 4.88 MIL/uL (ref 3.87–5.11)
RDW: 14.8 % (ref 11.5–15.5)
WBC: 6.5 10*3/uL (ref 4.0–10.5)
nRBC: 0 % (ref 0.0–0.2)

## 2022-07-14 LAB — BASIC METABOLIC PANEL
Anion gap: 9 (ref 5–15)
BUN: 17 mg/dL (ref 8–23)
CO2: 24 mmol/L (ref 22–32)
Calcium: 8.8 mg/dL — ABNORMAL LOW (ref 8.9–10.3)
Chloride: 105 mmol/L (ref 98–111)
Creatinine, Ser: 0.83 mg/dL (ref 0.44–1.00)
GFR, Estimated: >60 mL/min (ref >60)
Glucose, Bld: 197 mg/dL — ABNORMAL HIGH (ref 70–99)
Potassium: 4.1 mmol/L (ref 3.5–5.1)
Sodium: 138 mmol/L (ref 135–145)

## 2022-07-14 LAB — MAGNESIUM: Magnesium: 2 mg/dL (ref 1.7–2.4)

## 2022-07-14 MED ORDER — HYDROCODONE-ACETAMINOPHEN 5-325 MG PO TABS
1.0000 | ORAL_TABLET | Freq: Four times a day (QID) | ORAL | Status: DC | PRN
  Administered 2022-07-14 – 2022-07-15 (×2): 1 via ORAL
  Filled 2022-07-14 (×2): qty 1

## 2022-07-14 MED ORDER — IPRATROPIUM-ALBUTEROL 0.5-2.5 (3) MG/3ML IN SOLN
3.0000 mL | Freq: Three times a day (TID) | RESPIRATORY_TRACT | Status: DC
  Administered 2022-07-14 – 2022-07-15 (×4): 3 mL via RESPIRATORY_TRACT
  Filled 2022-07-14 (×4): qty 3

## 2022-07-14 NOTE — Progress Notes (Signed)
Mobility Specialist - Progress Note  Pre-mobility: SpO2 94% During mobility: SpO2 86-88% Post-mobility: SPO2 90%   07/14/22 1538  Mobility  Activity Ambulated with assistance in hallway  Level of Assistance Standby assist, set-up cues, supervision of patient - no hands on  Assistive Device Front wheel walker  Distance Ambulated (ft) 180 ft  Activity Response Tolerated well  Mobility Referral Yes  $Mobility charge 1 Mobility   Pt standing in bathroom upon entry, utilizing RA. Pt amb one lap around NS with RW SBA, tolerated well. During amb Pt O2 dropped to 86%, recovering to 89% after 1 min standing rest break. Pt denied feeling SOB or dizziness, however expressed feeling fatigued. Pt left supine on [place] 2L , 02 >88% with needs within reach. Sister present at bedside.   Candie Mile Mobility Specialist 07/14/22 3:42 PM

## 2022-07-14 NOTE — TOC Initial Note (Signed)
Transition of Care [facility]) - Initial/Assessment Note    Patient Details  Name: Wendy Arias MRN: [id number] Date of Birth: 1950-12-25  Transition of Care [facility]) CM/SW Contact:    Laurena Slimmer, RN Phone Number: 07/14/2022, 11:40 AM  Clinical Narrative:      Spoke with patient son, Thurmond Butts, to complete risk assessment.   Admitted for: Sob and confusion related to COPE exacerbation Admitted from: Home, lives with son WCB:JSEGB Samantha Crimes, PA-C Pharmacy: [facility], Denies any difficulty obtaining medications  Current home health/prior home health/DME: None Transportation: Sister for appointment and at discharge.   Thurmond Butts denied any other questions or concerns regarding discharge/ discharge plans at this time.                Expected Discharge Plan: Home/Self Care Barriers to Discharge: Continued Medical Work up   Patient Goals and CMS Choice Patient states their goals for this hospitalization and ongoing recovery are:: To return home          Expected Discharge Plan and Services       Living arrangements for the past 2 months: Single Family Home                                      Prior Living Arrangements/Services Living arrangements for the past 2 months: Single Family Home Lives with:: Adult Children (Son, Human resources officer) Patient language and need for interpreter reviewed:: Yes Do you feel safe going back to the place where you live?: Yes      Need for Family Participation in Patient Care: Yes (Comment) Care giver support system in place?: Yes (comment)   Criminal Activity/Legal Involvement Pertinent to Current Situation/Hospitalization: No - Comment as needed  Activities of Daily Living Home Assistive Devices/Equipment: Dentures (specify type) ADL Screening (condition at time of admission) Patient's cognitive ability adequate to safely complete daily activities?: No Is the patient deaf or have difficulty hearing?: No Does the patient have  difficulty seeing, even when wearing glasses/contacts?: No Does the patient have difficulty concentrating, remembering, or making decisions?: No Patient able to express need for assistance with ADLs?: Yes Does the patient have difficulty dressing or bathing?: No Independently performs ADLs?: Yes (appropriate for developmental age) Does the patient have difficulty walking or climbing stairs?: Yes Weakness of Legs: Both Weakness of Arms/Hands: Both  Permission Sought/Granted                  Emotional Assessment Appearance:: Other (Comment Required (Assessment completed by Hershal Coria) Attitude/Demeanor/Rapport: Gracious, Engaged Affect (typically observed): Accepting Orientation: :  (Patient AOx1, completed by Hershal Coria)   Psych Involvement: No (comment)  Admission diagnosis:  COPD exacerbation ([place]) [J44.1] Acute respiratory failure with hypoxia ([place]) [J96.01] Altered mental status, unspecified altered mental status type [R41.82] Patient Active Problem List   Diagnosis Date Noted   Acute on chronic diastolic CHF (congestive heart failure) ([place]) 15/17/6160   Acute metabolic encephalopathy 73/71/0626   HLD (hyperlipidemia) 07/13/2022   CAD (coronary artery disease) 07/13/2022   Depression with anxiety 07/13/2022   Tobacco abuse 07/13/2022   Atherosclerosis of artery of extremity with rest pain ([place]) 09/16/2021   Atherosclerosis of native arteries of extremity with rest pain ([place]) 08/07/2021   COPD exacerbation ([place]) 05/26/2021   Elevated serum alkaline phosphatase level 08/10/2018   Chronic diastolic CHF (congestive heart failure), NYHA class 3 ([place]) 08/09/2018   History of empyema  of pleura 08/09/2018   Chronic insomnia 08/09/2018   Itching 08/09/2018   Chronic cough 08/09/2018   Coronary artery disease involving native coronary artery of native heart without angina pectoris 05/08/2018   At risk for fluid volume overload 04/18/2018   Chronic pain 04/18/2018   Ischemic  cardiomyopathy 04/18/2018   Mitral regurgitation 04/18/2018   Aspiration into respiratory tract 04/17/2018   Anxiety and depression 04/17/2018   Dysphagia 04/17/2018   Dyspnea on exertion 04/17/2018   IDA (iron deficiency anemia) 04/17/2018   Infection by Streptococcus, viridans group 04/17/2018   Abnormal pleural fluid    Hepatic cirrhosis ([place])    Anemia of chronic disease    Empyema of right pleural space ([place])    Pneumonia 04/09/2018   Postmenopausal 03/29/2018   Gastroesophageal reflux disease without esophagitis 03/01/2018   Pure hypercholesterolemia 03/01/2018   Abscess of middle lobe of right lung with pneumonia ([place]) 02/16/2018   Essential hypertension 12/16/2017   Other nonspecific abnormal finding of lung field 12/16/2017   Mass of middle lobe of right lung 11/22/2017   Mass of lower lobe of right lung 11/22/2017   Mediastinal adenopathy 11/22/2017   Acute encephalopathy 11/03/2017   Benzodiazepine withdrawal ([place]) 11/03/2017   H/O diarrhea 06/14/2017   PCP:  Donnamarie Rossetti, PA-C Pharmacy:   [place], [place] [address] Phone: [phone] Fax: [phone]  [place], [place] [address] [place] [address] Phone: [phone] Fax: [phone]     Social Determinants of Health (SDOH) Social History: SDOH Screenings   Food Insecurity: No Food Insecurity (07/13/2022)  Housing: Low Risk  (07/13/2022)  Transportation Needs: No Transportation Needs (07/13/2022)  Utilities: Not At Risk (07/13/2022)  Financial Resource Strain: High Risk (02/16/2018)  Physical Activity: Inactive (02/16/2018)  Social Connections: Unknown (02/16/2018)  Stress: Stress Concern Present (02/16/2018)  Tobacco Use: High Risk (07/13/2022)   SDOH Interventions:     Readmission Risk Interventions    07/14/2022   11:37 AM  Readmission Risk Prevention  Plan  Transportation Screening Complete  PCP or Specialist Appt within 5-7 Days Complete  Home Care Screening Complete  Medication Review (RN CM) Complete

## 2022-07-14 NOTE — Consult Note (Signed)
   Heart Failure  Nurse Navigator Note  HFpEF 55 to 60%.  Left ventricular diastolic function is indeterminate.  Mild mitral regurgitation.  She presented to the emergency room with shortness of breath, lower extremity swelling and confusion.  Chest x-ray revealed vascular congestion.  BNP 109.  Comorbidities:  Hypertension Hyperlipidemia COPD Peripheral vascular disease Coronary artery disease with stenting Liver cirrhosis Dementia with anxiety  Medications:  Aspirin 81 mg daily Atorvastatin 40 mg daily Plavix 75 mg daily Furosemide 20 mg IV every 12 hours Lactulose 20 mg 2 times a day NicoDerm patch  Labs:  Sodium 138, potassium 4.1, chloride 105, CO2 24, BUN 17, creatinine 0.83 Weight is 60.9 kg Intake of 480 mL Output 400 mL Blood pressure 120/74   Initial meeting with patient and her sister, Jocelyn Lamer who was at the bedside.  Patient states that she has heard the term heart failure before but not in relationship to herself.  Discussed what heart failure means.  She voices understanding.  In speaking with the patient at times she had trouble getting the words out that she wanted to say.  She states that it becomes very frustrating as she knows what she wants to say she just cannot form the words.  States that she currently lives at home by herself but after being discharged from the hospital on this admission she plans on living with her sister.  Went over the importance steps of taking care of herself with heart failure.  Discussed the importance of daily weights and reporting and recording 2 pound weight gain overnight or 5 pounds total within the week.  Also to report any changes in symptoms such as worsening shortness of breath increasing lower extremity edema, increasing abdominal girth, PND and orthopnea she voices understanding.  Also discussed removing the saltshaker from the table and staying away from processed and convenience foods and eating foods in their  natural state.  She voices understanding . Went over the importance of fluid restriction of no more than 64 ounces and what is considered a liquid.  She voices understanding.  Also discussed follow-up in the outpatient heart failure clinic for which she has an appointment on February 1 at 11 in the morning.  This is 6% no-show which is 3 out of 52 appointments.  They were given the living with heart failure teaching booklet, zone magnet, info on low-sodium and heart failure along with weight chart.  They had no further questions at this time but will continue to follow along.   Pricilla Riffle RN CHFN

## 2022-07-14 NOTE — Hospital Course (Signed)
72 y.o. female with medical history significant of hypertension, hyperlipidemia, COPD, PVD, CAD, stent placement, diastolic CHF, liver cirrhosis, dementia with anxiety, who presents with shortness breath and confusion   1/17: Wean oxygen, palliative care consult for goals of care

## 2022-07-14 NOTE — Discharge Instructions (Signed)

## 2022-07-14 NOTE — Progress Notes (Signed)
  Progress Note   Patient: Wendy Arias NOT:[id number] DOB: 05-22-1951 DOA: 07/13/2022     1 DOS: the patient was seen and examined on 07/14/2022   Brief hospital course:  72 y.o. female with medical history significant of hypertension, hyperlipidemia, COPD, PVD, CAD, stent placement, diastolic CHF, liver cirrhosis, dementia with anxiety, who presents with shortness breath and confusion   1/17: Wean oxygen, palliative care consult for goals of care  Assessment and Plan:  COPD exacerbation ([place]) and chronic diastolic CHF: Patient seems to have COPD exacerbation.  Patient has rhonchi on auscultation, indicating possible COPD exacerbation. BNP 109.  Acute CHF ruled out   - Continue Lasix 20 mg bid  - 2d echo repeated this admission shows EF of 55 to 60% and mild to regurgitation -Continue Bronchodilators, solu-Medrol 40 mg IV bid, Azithromycin, Mucinex for cough, Incentive spirometry -Pending sputum culture -On 2 L nasal cannula oxygen as needed to maintain O2 saturation 93% or greater   Acute metabolic encephalopathy: CT head negative for acute intracranial abnormality.  Suspect hepatic encephalopathy versus CO2 retention from COPD exacerbation -continue lactulose 20 g twice daily empirically as ammonia 36   Hepatic cirrhosis (HCC) -ammonia level 36   Essential hypertension -IV hydralazine as needed -Continue IV Lasix   HLD (hyperlipidemia) -Lipitor   CAD (coronary artery disease) -Aspirin, Plavix, Lipitor   Depression with anxiety -Amitriptyline   Tobacco abuse -Nicotine patch      Subjective: Requesting stronger pain medicine/Norco.  Sister at bedside feels she may have intermittent confusion.  Breathing has improved some.  Cough persists  Physical Exam: Vitals:   07/14/22 0405 07/14/22 0755 07/14/22 0803 07/14/22 1347  BP:  120/74    Pulse:  90    Resp:  18    Temp:  98.2 F (36.8 C)    TempSrc:      SpO2:  92% 91% 94%  Weight: 60.21 kg      72 year old female  lying in the bed comfortably without any acute distress Lungs rhonchi bilaterally at the bases, mild expiratory wheezing bilaterally Cardiovascular regular rate and rhythm Abdomen soft, benign Neuro alert and awake, nonfocal Skin no rash or lesion  Data Reviewed:  Ammonia 36  Family Communication: Sister updated at bedside  Disposition: Status is: Inpatient Remains inpatient appropriate because: Management of COPD exacerbation and hypoxia  Planned Discharge Destination: Home with Home Health   DVT prophylaxis-Lovenox Time spent: 35 minutes  Author: Max Sane, MD 07/14/2022 2:37 PM  For on call review [email or web address].

## 2022-07-15 DIAGNOSIS — R4182 Altered mental status, unspecified: Secondary | ICD-10-CM

## 2022-07-15 DIAGNOSIS — J9601 Acute respiratory failure with hypoxia: Secondary | ICD-10-CM

## 2022-07-15 MED ORDER — PREDNISONE 10 MG (21) PO TBPK: ORAL_TABLET | ORAL | 0 refills | Status: DC

## 2022-07-15 MED ORDER — IPRATROPIUM-ALBUTEROL 0.5-2.5 (3) MG/3ML IN SOLN: 3.0000 mL | Freq: Two times a day (BID) | RESPIRATORY_TRACT | Status: DC

## 2022-07-15 MED ORDER — IPRATROPIUM-ALBUTEROL 0.5-2.5 (3) MG/3ML IN SOLN: 3.0000 mL | Freq: Three times a day (TID) | RESPIRATORY_TRACT | 0 refills | Status: DC

## 2022-07-15 MED ORDER — DM-GUAIFENESIN ER 30-600 MG PO TB12: 1.0000 | ORAL_TABLET | Freq: Two times a day (BID) | ORAL | 0 refills | Status: AC | PRN

## 2022-07-15 NOTE — TOC Progression Note (Signed)
Transition of Care [facility]) - Progression Note    Patient Details  Name: Wendy Arias MRN: [id number] Date of Birth: [birth year]  [facility] CM/SW Contact  Laurena Slimmer, RN Phone Number: 07/15/2022, 9:18 AM  Clinical Narrative:    Referral for continuous oxygen and nebulizer sent to [place] [facility].    Expected Discharge Plan: Home/Self Care Barriers to Discharge: Continued Medical Work up  Expected Discharge Plan and Services       Living arrangements for the past 2 months: Single Family Home Expected Discharge Date: 07/15/22                                     Social Determinants of Health (SDOH) Interventions SDOH Screenings   Food Insecurity: No Food Insecurity (07/13/2022)  Housing: Low Risk  (07/13/2022)  Transportation Needs: No Transportation Needs (07/13/2022)  Utilities: Not At Risk (07/13/2022)  Financial Resource Strain: High Risk (02/16/2018)  Physical Activity: Inactive (02/16/2018)  Social Connections: Unknown (02/16/2018)  Stress: Stress Concern Present (02/16/2018)  Tobacco Use: High Risk (07/13/2022)    Readmission Risk Interventions    07/14/2022   11:37 AM  Readmission Risk Prevention Plan  Transportation Screening Complete  PCP or Specialist Appt within 5-7 Days Complete  Home Care Screening Complete  Medication Review (RN CM) Complete

## 2022-07-15 NOTE — Progress Notes (Signed)
Mobility Specialist - Progress Note   07/15/22 0937  Mobility  Activity Ambulated with assistance in hallway  Level of Assistance Standby assist, set-up cues, supervision of patient - no hands on  Assistive Device Front wheel walker  Distance Ambulated (ft) 180 ft  Activity Response Tolerated well  $Mobility charge 1 Mobility   Nurse requested Mobility Specialist to perform oxygen saturation test with pt which includes removing pt from oxygen both at rest and while ambulating.  Below are the results from that testing.     Patient Saturations on Room Air at Rest = spO2 88%  Patient Saturations on Room Air while Ambulating = sp02 76% .  Rested and performed pursed lip breathing for 1 minute with sp02 at 76%.  Patient Saturations on 2 Liters of oxygen while Ambulating = sp02 88-89%  At end of testing pt left in room on 2 Liters of oxygen.  Reported results to nurse.  Pt supine upon entry, utilizing RA. Pt completed bed mobility indep, STS to RW MinA and amb SBA. Pt O2 88% on RA while sitting EOB, pt denied any symptoms such as SOB or dizziness. Pt amb one lap around NS, tolerated well. During amb Pt O2 destat to 76%,after one min of pursed lip breathing O2 remains at 76%. Pt placed on 2L [place] and recovered to 88-89% after 1-2 min standing rest break. Pt returned to room, left sitting EOB on 2L [place] with O2 >90%.   Candie Mile Mobility Specialist 07/15/22 9:44 AM

## 2022-07-15 NOTE — Care Management Important Message (Signed)
Important Message  Patient Details  Name: Wendy Arias MRN: [id number] Date of Birth: Feb 21, 1951   Medicare Important Message Given:  N/A - LOS <3 / Initial given by admissions     Dannette Barbara 07/15/2022, 1:21 PM

## 2022-07-16 DIAGNOSIS — J449 Chronic obstructive pulmonary disease, unspecified: Secondary | ICD-10-CM | POA: Diagnosis not present

## 2022-07-16 NOTE — Discharge Summary (Signed)
Physician Discharge Summary   Patient: Wendy Arias MRN: [id number] DOB: 1951-01-10  Admit date:     07/13/2022  Discharge date: 07/15/2022  Discharge Physician: Max Sane   PCP: Donnamarie Rossetti, PA-C   Recommendations at discharge:   Follow-up with outpatient providers as requested  Discharge Diagnoses: Principal Problem:   COPD exacerbation ([place]) Active Problems:   Acute on chronic diastolic CHF (congestive heart failure) (HCC)   Acute metabolic encephalopathy   Hepatic cirrhosis (HCC)   Essential hypertension   HLD (hyperlipidemia)   CAD (coronary artery disease)   Depression with anxiety   Tobacco abuse  Hospital Course:  72 y.o. female with medical history significant of hypertension, hyperlipidemia, COPD, PVD, CAD, stent placement, diastolic CHF, liver cirrhosis, dementia with anxiety, who presents with shortness breath and confusion   1/17: Wean oxygen, palliative care consult for goals of care  Assessment and Plan:  72 y.o. female with medical history significant of hypertension, hyperlipidemia, COPD, PVD, CAD, stent placement, diastolic CHF, liver cirrhosis, dementia with anxiety, who presents with shortness breath and confusion    1/17: Wean oxygen, palliative care consult for goals of care   Assessment and Plan:   COPD exacerbation ([place]) and chronic diastolic CHF: Patient seems to have COPD exacerbation.  Patient has rhonchi on auscultation, indicating possible COPD exacerbation. BNP 109.  Acute CHF ruled out - 2d echo repeated this admission shows EF of 55 to 60% and mild to regurgitation -Treated with bronchodilators, steroids, empiric azithromycin, Mucinex for cough, Incentive spirometry -She was set up with 2 L oxygen continuous for discharge as she qualified for home and also was given nebulizer as that seem to have helped her breathing   Acute metabolic encephalopathy: CT head negative for acute intracranial abnormality.  Suspect hepatic  encephalopathy versus CO2 retention from COPD exacerbation -continue lactulose 20 g twice daily empirically for ammonia 36 -Her mental status was back to baseline on the day of discharge   Hepatic cirrhosis (HCC) -ammonia level 36 on admission and was treated with lactulose   Essential hypertension  HLD (hyperlipidemia)   CAD (coronary artery disease) -Aspirin, Plavix, Lipitor   Depression with anxiety -Amitriptyline   Tobacco abuse Counseled        Disposition: Home Diet recommendation:  Discharge Diet Orders (From admission, onward)     Start     Ordered   07/15/22 0000  Diet - low sodium heart healthy        07/15/22 0846           Carb modified diet DISCHARGE MEDICATION: Allergies as of 07/15/2022       Reactions   Penicillins Other (See Comments)   Has patient had a PCN reaction causing immediate rash, facial/tongue/throat swelling, SOB or lightheadedness with hypotension: Yes Has patient had a PCN reaction causing severe rash involving mucus membranes or skin necrosis: Unknown Has patient had a PCN reaction that required hospitalization: Unknown Has patient had a PCN reaction occurring within the last 10 years: Unknown If all of the above answers are "NO", then may proceed with Cephalosporin use. Pt has taken augmentin before        Medication List     STOP taking these medications    metoprolol succinate 25 MG 24 hr tablet Commonly known as: TOPROL-XL   oxyCODONE-acetaminophen 5-325 MG tablet Commonly known as: Percocet   triamcinolone cream 0.1 % Commonly known as: KENALOG       TAKE these medications    albuterol 108 (  90 Base) MCG/ACT inhaler Commonly known as: VENTOLIN HFA Inhale into the lungs every 6 (six) hours as needed for wheezing or shortness of breath.   amitriptyline 100 MG tablet Commonly known as: ELAVIL Take 100 mg by mouth at bedtime.   aspirin EC 81 MG tablet Take 1 tablet (81 mg total) by mouth daily.    atorvastatin 40 MG tablet Commonly known as: LIPITOR Take 40 mg by mouth daily.   clopidogrel 75 MG tablet Commonly known as: PLAVIX TAKE 1 TABLET BY MOUTH DAILY AT 6AM What changed: See the new instructions.   cyanocobalamin 1000 MCG tablet Commonly known as: VITAMIN B12 Take 1,000 mcg by mouth daily.   dextromethorphan-guaiFENesin 30-600 MG 12hr tablet Commonly known as: MUCINEX DM Take 1 tablet by mouth 2 (two) times daily as needed for up to 7 days for cough.   gabapentin 300 MG capsule Commonly known as: NEURONTIN Take 2 capsules (600 mg total) by mouth at bedtime.   HYDROcodone-acetaminophen 5-325 MG tablet Commonly known as: NORCO/VICODIN Take 1 tablet by mouth 2 (two) times daily as needed for moderate pain.   ipratropium-albuterol 0.5-2.5 (3) MG/3ML Soln Commonly known as: DUONEB Take 3 mLs by nebulization 3 (three) times daily.   Magnesium 400 MG Caps Take 1 tablet by mouth daily.   predniSONE 10 MG (21) Tbpk tablet Commonly known as: STERAPRED UNI-PAK 21 TAB Start 60 mg po daily, taper 10 mg daily until finish   pregabalin 150 MG capsule Commonly known as: LYRICA Take 150 mg by mouth 3 (three) times daily.   Trelegy Ellipta 100-62.5-25 MCG/ACT Aepb Generic drug: Fluticasone-Umeclidin-Vilant Inhale 1 puff into the lungs daily.   Vitamin E 268 MG (400 UNIT) Caps Take 400 Units by mouth daily.        Follow-up Information     Venetia Maxon, [employer], PA-C. Schedule an appointment as soon as possible for a visit in 1 week(s).   Specialty: Family Medicine Why: Patient must call to schedule.  [facility] Discharge F/UP Contact information: [place] [address] [phone]         Ottie Glazier, MD. Schedule an appointment as soon as possible for a visit in 2 week(s).   Specialty: Pulmonary Disease Why: Patient must call to schedule.  [facility] Discharge F/UP Contact information: [place] [address] [phone]                Discharge Exam: Danley Danker Weights   07/13/22 1444 07/14/22 0405 07/15/22 0500  Weight: 61.2 kg 60.9 kg 36 kg   72 year old female lying in the bed comfortably without any acute distress Lungs rhonchi bilaterally at the bases, mild expiratory wheezing bilaterally Cardiovascular regular rate and rhythm Abdomen soft, benign Neuro alert and awake, nonfocal Skin no rash or lesion  Condition at discharge: fair  The results of significant diagnostics from this hospitalization (including imaging, microbiology, ancillary and laboratory) are listed below for reference.   Imaging Studies: ECHOCARDIOGRAM COMPLETE  Result Date: 07/14/2022    ECHOCARDIOGRAM REPORT   Patient Name:   KELIYAH SPILLMAN Date of Exam: 07/13/2022 Medical Rec #:  [id number]    Height:       61.5 in Accession #:    [id number]   Weight:       135.0 lb Date of Birth:  [birth year]    BSA:          1.608 m Patient Age:    72 years  BP:           121/96 mmHg Patient Gender: F            HR:           82 bpm. Exam Location:  [facility] Procedure: 2D Echo, Cardiac Doppler and Color Doppler Indications:     L57.26 Acute Diastolic CHF  History:         Patient has no prior history of Echocardiogram examinations.                  CAD, COPD; Risk Factors:Hypertension and Dyslipidemia.                  Emphysema. Ischemic cardiomyopathy. NSTEMI.  Sonographer:     Cresenciano Lick RDCS Referring Phys:  [id number] Soledad Gerlach NIU Diagnosing Phys: Nelva Bush MD IMPRESSIONS  1. Left ventricular ejection fraction, by estimation, is 55 to 60%. The left ventricle has normal function. The left ventricle has no regional wall motion abnormalities. Left ventricular diastolic parameters are indeterminate.  2. Right ventricular systolic function is normal. The right ventricular size is normal. Tricuspid regurgitation signal is inadequate for assessing PA pressure.  3. The mitral valve is degenerative. Mild  mitral valve regurgitation. No evidence of mitral stenosis.  4. The aortic valve was not well visualized. Aortic valve regurgitation is not visualized. No aortic stenosis is present. FINDINGS  Left Ventricle: Left ventricular ejection fraction, by estimation, is 55 to 60%. The left ventricle has normal function. The left ventricle has no regional wall motion abnormalities. The left ventricular internal cavity size was normal in size. There is  no left ventricular hypertrophy. Left ventricular diastolic parameters are indeterminate. Right Ventricle: The right ventricular size is normal. Right vetricular wall thickness was not well visualized. Right ventricular systolic function is normal. Tricuspid regurgitation signal is inadequate for assessing PA pressure. Left Atrium: Left atrial size was normal in size. Right Atrium: Right atrial size was normal in size. Pericardium: There is no evidence of pericardial effusion. Presence of epicardial fat layer. Mitral Valve: The mitral valve is degenerative in appearance. Mild mitral annular calcification. Mild mitral valve regurgitation. No evidence of mitral valve stenosis. Tricuspid Valve: The tricuspid valve is not well visualized. Tricuspid valve regurgitation is not demonstrated. Aortic Valve: The aortic valve was not well visualized. Aortic valve regurgitation is not visualized. No aortic stenosis is present. Pulmonic Valve: The pulmonic valve was not well visualized. Pulmonic valve regurgitation is not visualized. No evidence of pulmonic stenosis. Aorta: The aortic root is normal in size and structure. Pulmonary Artery: The pulmonary artery is not well seen. Venous: The inferior vena cava was not well visualized. IAS/Shunts: The interatrial septum was not well visualized.  LEFT VENTRICLE PLAX 2D LVIDd:         3.80 cm   Diastology LVIDs:         2.70 cm   LV e' medial:    8.16 cm/s LV PW:         0.50 cm   LV E/e' medial:  8.8 LV IVS:        0.80 cm   LV e' lateral:    8.38 cm/s LVOT diam:     1.70 cm   LV E/e' lateral: 8.6 LV SV:         29 LV SV Index:   18 LVOT Area:     2.27 cm  RIGHT VENTRICLE RV Basal diam:  2.90 cm RV S prime:  11.97 cm/s TAPSE (M-mode): 2.3 cm LEFT ATRIUM             Index        RIGHT ATRIUM           Index LA diam:        4.20 cm 2.61 cm/m   RA Area:     12.50 cm LA Vol (A2C):   25.0 ml 15.55 ml/m  RA Volume:   30.30 ml  18.84 ml/m LA Vol (A4C):   30.3 ml 18.84 ml/m LA Biplane Vol: 27.5 ml 17.10 ml/m  AORTIC VALVE LVOT Vmax:   68.15 cm/s LVOT Vmean:  46.150 cm/s LVOT VTI:    0.129 m  AORTA Ao Root diam: 3.10 cm MITRAL VALVE MV Area (PHT): 3.72 cm     SHUNTS MV Decel Time: 204 msec     Systemic VTI:  0.13 m MV E velocity: 71.80 cm/s   Systemic Diam: 1.70 cm MV A velocity: 118.00 cm/s MV E/A ratio:  0.61 Harrell Gave End MD Electronically signed by Nelva Bush MD Signature Date/Time: 07/14/2022/8:00:34 AM    Final    CT Head Wo Contrast  Result Date: 07/13/2022 CLINICAL DATA:  Mental status change with unknown cause. EXAM: CT HEAD WITHOUT CONTRAST TECHNIQUE: Contiguous axial images were obtained from the base of the skull through the vertex without intravenous contrast. RADIATION DOSE REDUCTION: This exam was performed according to the departmental dose-optimization program which includes automated exposure control, adjustment of the mA and/or kV according to patient size and/or use of iterative reconstruction technique. COMPARISON:  04/14/2018 FINDINGS: Brain: No evidence of acute infarction, hemorrhage, hydrocephalus, extra-axial collection or mass lesion/mass effect. Mild cerebral atrophy. Vascular: No hyperdense vessel or unexpected calcification. Skull: Normal. Negative for fracture or focal lesion. Sinuses/Orbits: No acute finding. Other: None. IMPRESSION: No acute intracranial abnormalities. Electronically Signed   By: Lucienne Capers M.D.   On: 07/13/2022 16:09   DG Chest 2 View  Result Date: 07/13/2022 CLINICAL DATA:   Shortness of breath and confusion. EXAM: CHEST - 2 VIEW COMPARISON:  05/26/2021 FINDINGS: The cardiac silhouette, mediastinal and hilar contours are within normal limits and stable. Stable tortuosity and calcification of the thoracic aorta. Low lung volumes with vascular crowding and streaky basilar atelectasis but no infiltrates, edema or effusions. The bony thorax is intact. Remote L1 compression fracture noted. IMPRESSION: Low lung volumes with vascular crowding and streaky basilar atelectasis. Electronically Signed   By: Marijo Sanes M.D.   On: 07/13/2022 15:29    Microbiology: Results for orders placed or performed during the hospital encounter of 07/13/22  Resp panel by RT-PCR (RSV, Flu A&B, Covid) Anterior Nasal Swab     Status: None   Collection Time: 07/13/22  2:55 PM   Specimen: Anterior Nasal Swab  Result Value Ref Range Status   SARS Coronavirus 2 by RT PCR NEGATIVE NEGATIVE Final    Comment: (NOTE) SARS-CoV-2 target nucleic acids are NOT DETECTED.  The SARS-CoV-2 RNA is generally detectable in upper respiratory specimens during the acute phase of infection. The lowest concentration of SARS-CoV-2 viral copies this assay can detect is 138 copies/mL. A negative result does not preclude SARS-Cov-2 infection and should not be used as the sole basis for treatment or other patient management decisions. A negative result may occur with  improper specimen collection/handling, submission of specimen other than nasopharyngeal swab, presence of viral mutation(s) within the areas targeted by this assay, and inadequate number of viral copies(<138 copies/mL). A negative result must  be combined with clinical observations, patient history, and epidemiological information. The expected result is Negative.  Fact Sheet for Patients:  EntrepreneurPulse.com.au  Fact Sheet for Healthcare Providers:  IncredibleEmployment.be  This test is no t yet approved or  cleared by the [place] FDA and  has been authorized for detection and/or diagnosis of SARS-CoV-2 by FDA under an Emergency Use Authorization (EUA). This EUA will remain  in effect (meaning this test can be used) for the duration of the COVID-19 declaration under Section 564(b)(1) of the Act, 21 U.S.C.section 360bbb-3(b)(1), unless the authorization is terminated  or revoked sooner.       Influenza A by PCR NEGATIVE NEGATIVE Final   Influenza B by PCR NEGATIVE NEGATIVE Final    Comment: (NOTE) The Xpert Xpress SARS-CoV-2/FLU/RSV plus assay is intended as an aid in the diagnosis of influenza from Nasopharyngeal swab specimens and should not be used as a sole basis for treatment. Nasal washings and aspirates are unacceptable for Xpert Xpress SARS-CoV-2/FLU/RSV testing.  Fact Sheet for Patients: EntrepreneurPulse.com.au  Fact Sheet for Healthcare Providers: IncredibleEmployment.be  This test is not yet approved or cleared by the [place] FDA and has been authorized for detection and/or diagnosis of SARS-CoV-2 by FDA under an Emergency Use Authorization (EUA). This EUA will remain in effect (meaning this test can be used) for the duration of the COVID-19 declaration under Section 564(b)(1) of the Act, 21 U.S.C. section 360bbb-3(b)(1), unless the authorization is terminated or revoked.     Resp Syncytial Virus by PCR NEGATIVE NEGATIVE Final    Comment: (NOTE) Fact Sheet for Patients: EntrepreneurPulse.com.au  Fact Sheet for Healthcare Providers: IncredibleEmployment.be  This test is not yet approved or cleared by the [place] FDA and has been authorized for detection and/or diagnosis of SARS-CoV-2 by FDA under an Emergency Use Authorization (EUA). This EUA will remain in effect (meaning this test can be used) for the duration of the COVID-19 declaration under Section 564(b)(1) of the Act, 21  U.S.C. section 360bbb-3(b)(1), unless the authorization is terminated or revoked.  Performed at [facility], [address] [place], [place] [address]     Labs: CBC: Recent Labs  Lab 07/13/22 1450 07/14/22 0348  WBC 9.8 6.5  HGB 14.5 13.5  HCT 47.2* 43.0  MCV 91.8 88.1  PLT 203 657   Basic Metabolic Panel: Recent Labs  Lab 07/13/22 1450 07/14/22 0348  NA 138 138  K 4.5 4.1  CL 107 105  CO2 25 24  GLUCOSE 90 197*  BUN 17 17  CREATININE 1.01* 0.83  CALCIUM 8.9 8.8*  MG  --  2.0   Liver Function Tests: Recent Labs  Lab 07/13/22 1450  AST 15  ALT 15  ALKPHOS 106  BILITOT 0.8  PROT 6.8  ALBUMIN 3.7   CBG: No results for input(s): "GLUCAP" in the last 168 hours.  Discharge time spent: greater than 30 minutes.  Signed: Max Sane, MD [facility] 07/16/2022

## 2022-07-20 DIAGNOSIS — I10 Essential (primary) hypertension: Secondary | ICD-10-CM | POA: Diagnosis not present

## 2022-07-20 DIAGNOSIS — G8929 Other chronic pain: Secondary | ICD-10-CM | POA: Diagnosis not present

## 2022-07-20 DIAGNOSIS — I70223 Atherosclerosis of native arteries of extremities with rest pain, bilateral legs: Secondary | ICD-10-CM | POA: Diagnosis not present

## 2022-07-20 DIAGNOSIS — J449 Chronic obstructive pulmonary disease, unspecified: Secondary | ICD-10-CM | POA: Diagnosis not present

## 2022-07-20 DIAGNOSIS — R202 Paresthesia of skin: Secondary | ICD-10-CM | POA: Diagnosis not present

## 2022-07-20 DIAGNOSIS — I5032 Chronic diastolic (congestive) heart failure: Secondary | ICD-10-CM | POA: Diagnosis not present

## 2022-07-20 DIAGNOSIS — F3341 Major depressive disorder, recurrent, in partial remission: Secondary | ICD-10-CM | POA: Diagnosis not present

## 2022-07-27 ENCOUNTER — Encounter: Payer: No Typology Code available for payment source | Admitting: Family

## 2022-07-27 DIAGNOSIS — R0602 Shortness of breath: Secondary | ICD-10-CM | POA: Diagnosis not present

## 2022-07-30 ENCOUNTER — Encounter: Payer: No Typology Code available for payment source | Admitting: Family

## 2022-08-06 DIAGNOSIS — H2513 Age-related nuclear cataract, bilateral: Secondary | ICD-10-CM | POA: Diagnosis not present

## 2022-08-06 DIAGNOSIS — H25041 Posterior subcapsular polar age-related cataract, right eye: Secondary | ICD-10-CM | POA: Diagnosis not present

## 2022-09-02 DIAGNOSIS — R202 Paresthesia of skin: Secondary | ICD-10-CM | POA: Diagnosis not present

## 2022-09-02 DIAGNOSIS — I2510 Atherosclerotic heart disease of native coronary artery without angina pectoris: Secondary | ICD-10-CM | POA: Diagnosis not present

## 2022-09-02 DIAGNOSIS — J449 Chronic obstructive pulmonary disease, unspecified: Secondary | ICD-10-CM | POA: Diagnosis not present

## 2022-09-02 DIAGNOSIS — G8929 Other chronic pain: Secondary | ICD-10-CM | POA: Diagnosis not present

## 2022-09-02 DIAGNOSIS — F331 Major depressive disorder, recurrent, moderate: Secondary | ICD-10-CM | POA: Diagnosis not present

## 2022-09-02 DIAGNOSIS — I10 Essential (primary) hypertension: Secondary | ICD-10-CM | POA: Diagnosis not present

## 2022-09-02 DIAGNOSIS — R7303 Prediabetes: Secondary | ICD-10-CM | POA: Diagnosis not present

## 2022-09-02 DIAGNOSIS — I5032 Chronic diastolic (congestive) heart failure: Secondary | ICD-10-CM | POA: Diagnosis not present

## 2022-09-02 DIAGNOSIS — Z0000 Encounter for general adult medical examination without abnormal findings: Secondary | ICD-10-CM | POA: Diagnosis not present

## 2022-09-02 DIAGNOSIS — I70223 Atherosclerosis of native arteries of extremities with rest pain, bilateral legs: Secondary | ICD-10-CM | POA: Diagnosis not present

## 2022-09-14 DIAGNOSIS — J449 Chronic obstructive pulmonary disease, unspecified: Secondary | ICD-10-CM | POA: Diagnosis not present

## 2022-09-22 DIAGNOSIS — R4182 Altered mental status, unspecified: Secondary | ICD-10-CM

## 2022-09-22 DIAGNOSIS — J9601 Acute respiratory failure with hypoxia: Secondary | ICD-10-CM

## 2022-09-30 DIAGNOSIS — R202 Paresthesia of skin: Secondary | ICD-10-CM | POA: Diagnosis not present

## 2022-09-30 DIAGNOSIS — G8929 Other chronic pain: Secondary | ICD-10-CM | POA: Diagnosis not present

## 2022-09-30 DIAGNOSIS — G253 Myoclonus: Secondary | ICD-10-CM | POA: Diagnosis not present

## 2022-10-15 DIAGNOSIS — J449 Chronic obstructive pulmonary disease, unspecified: Secondary | ICD-10-CM | POA: Diagnosis not present

## 2022-11-14 DIAGNOSIS — J449 Chronic obstructive pulmonary disease, unspecified: Secondary | ICD-10-CM | POA: Diagnosis not present

## 2022-12-13 DIAGNOSIS — I5032 Chronic diastolic (congestive) heart failure: Secondary | ICD-10-CM | POA: Diagnosis not present

## 2022-12-13 DIAGNOSIS — G894 Chronic pain syndrome: Secondary | ICD-10-CM | POA: Diagnosis not present

## 2022-12-13 DIAGNOSIS — F411 Generalized anxiety disorder: Secondary | ICD-10-CM | POA: Diagnosis not present

## 2022-12-13 DIAGNOSIS — R053 Chronic cough: Secondary | ICD-10-CM | POA: Diagnosis not present

## 2022-12-13 DIAGNOSIS — R600 Localized edema: Secondary | ICD-10-CM | POA: Diagnosis not present

## 2022-12-15 DIAGNOSIS — J449 Chronic obstructive pulmonary disease, unspecified: Secondary | ICD-10-CM | POA: Diagnosis not present

## 2023-01-20 DIAGNOSIS — Z79899 Other long term (current) drug therapy: Secondary | ICD-10-CM | POA: Diagnosis not present

## 2023-01-20 DIAGNOSIS — I5032 Chronic diastolic (congestive) heart failure: Secondary | ICD-10-CM | POA: Diagnosis not present

## 2023-01-20 DIAGNOSIS — R600 Localized edema: Secondary | ICD-10-CM | POA: Diagnosis not present

## 2023-01-20 DIAGNOSIS — I2510 Atherosclerotic heart disease of native coronary artery without angina pectoris: Secondary | ICD-10-CM | POA: Diagnosis not present

## 2023-01-20 DIAGNOSIS — I255 Ischemic cardiomyopathy: Secondary | ICD-10-CM | POA: Diagnosis not present

## 2023-01-20 DIAGNOSIS — I10 Essential (primary) hypertension: Secondary | ICD-10-CM | POA: Diagnosis not present

## 2023-01-27 ENCOUNTER — Ambulatory Visit: Payer: HMO | Admitting: Student in an Organized Health Care Education/Training Program

## 2023-01-27 ENCOUNTER — Encounter: Payer: Self-pay | Admitting: Student in an Organized Health Care Education/Training Program

## 2023-01-27 VITALS — BP 113/78 | HR 87 | Temp 97.7°F | Resp 18 | Ht 60.0 in | Wt 140.0 lb

## 2023-01-27 DIAGNOSIS — M792 Neuralgia and neuritis, unspecified: Secondary | ICD-10-CM | POA: Diagnosis not present

## 2023-01-27 DIAGNOSIS — I739 Peripheral vascular disease, unspecified: Secondary | ICD-10-CM | POA: Insufficient documentation

## 2023-01-27 DIAGNOSIS — G5772 Causalgia of left lower limb: Secondary | ICD-10-CM | POA: Diagnosis not present

## 2023-01-27 DIAGNOSIS — G5773 Causalgia of bilateral lower limbs: Secondary | ICD-10-CM

## 2023-01-27 DIAGNOSIS — G894 Chronic pain syndrome: Secondary | ICD-10-CM | POA: Insufficient documentation

## 2023-01-27 DIAGNOSIS — G5771 Causalgia of right lower limb: Secondary | ICD-10-CM | POA: Diagnosis not present

## 2023-01-27 MED ORDER — TIZANIDINE HCL 2 MG PO TABS: 2.0000 mg | ORAL_TABLET | Freq: Every evening | ORAL | 1 refills | Status: AC | PRN

## 2023-01-27 NOTE — Progress Notes (Signed)
Patient: Wendy Arias  Service Category: E/M  Provider: Edward Jolly, MD  DOB: May 17, 1951  DOS: 01/27/2023  Referring Provider: Wilford Corner  MRN: [id number]  Setting: Ambulatory outpatient  PCP: Wilford Corner, PA-C  Type: New Patient  Specialty: Interventional Pain Management    Location: Office  Delivery: Face-to-face     Primary Reason(s) for Visit: Encounter for initial evaluation of one or more chronic problems (new to examiner) potentially causing chronic pain, and posing a threat to normal musculoskeletal function. (Level of risk: High) CC: Groin Pain (Bilateral)  HPI  Wendy Arias is a 72 y.o. year old, female patient, who comes for the first time to our practice referred by Wilford Corner,* for our initial evaluation of her chronic pain. 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; Coronary artery disease involving native coronary artery of native heart without angina pectoris; Dysphagia; Dyspnea on exertion; Elevated serum alkaline phosphatase level; Essential hypertension; Gastroesophageal reflux disease without esophagitis; History of empyema of pleura; IDA (iron deficiency anemia); Infection by Streptococcus, viridans group; Chronic insomnia; Ischemic cardiomyopathy; Itching; Mitral regurgitation; Other nonspecific abnormal finding of lung field; Chronic cough; Postmenopausal; Pure hypercholesterolemia; Atherosclerosis  of native arteries of extremity with rest pain (HCC); Atherosclerosis of artery of extremity with rest pain (HCC); Acute on chronic diastolic CHF (congestive heart failure) (HCC); Acute metabolic encephalopathy; HLD (hyperlipidemia); CAD (coronary artery disease); Depression with anxiety; Tobacco abuse; Acute respiratory failure with hypoxia (HCC); Altered mental status; Peripheral vascular disease (HCC) (hx of femoral endarterectomy September 17 2021); and Neuropathic pain of lower extremity on their problem list. Today she comes in for evaluation of her Groin Pain (Bilateral)  Pain Assessment: Location: Right, Left Groin Radiating: front and insides of both legs Onset: More than a month ago Duration: Chronic pain Quality: Burning, Other (Comment) (shocking) Severity: 6 /10 (subjective, self-reported pain score)  Effect on ADL: difficulty performing daily activities Timing: Constant Modifying factors: Gabapentin, Lyrica, Norco BP: 113/78  HR: 87  Onset and Duration: Present longer than 3 months Cause of pain: Surgery Severity: Getting worse Timing: Not influenced by the time of the day Aggravating Factors: Motion Alleviating Factors: Medications Associated Problems: Tingling Quality of Pain: Constant and Uncomfortable Previous Examinations or Tests: CT scan, MRI scan, and Nerve block Previous Treatments: Narcotic medications  Wendy Arias is being evaluated for possible interventional pain management therapies for the treatment of her chronic pain.   Wendy Arias is a pleasant 72 year old female who presents with a chief complaint of bilateral groin pain that radiates along her medial thigh to her knees.  She has a history of femoral endarterectomy that was done in March 2023.  She states that her burning pain started then.  She endorses swelling, temperature changes states that it is cool to touch as well as allodynia of her groin and medial thigh.  She currently sees neurology and is managed on  gabapentin and Lyrica.  She also takes hydrocodone 5 to 10 mg nightly that is prescribed by her primary care provider.  She  states that she has seen multiple specialists and has not had much success with pain relief for pain management.  Previous diagnosis of complex regional pain syndrome was made and I agree with this diagnosis.  She does meet [place] criteria for CRPS colluding allodynia, pain, edema, color changes and temperature changes.  She is currently on Plavix as well for peripheral vascular disease.  She endorses muscle tightness in her groins in the evening.  Historic Controlled Substance Pharmacotherapy Review  PMP and historical list of controlled substances: Hydrocodone 5 mg to 10 mg nightly as needed Historical Monitoring: The patient  reports no history of drug use. List of prior UDS Testing: Lab Results  Component Value Date   MDMA NONE DETECTED 11/03/2017   COCAINSCRNUR NONE DETECTED 11/03/2017   PCPSCRNUR NONE DETECTED 11/03/2017   THCU NONE DETECTED 11/03/2017   ETH <10 11/02/2017   Historical Background Evaluation: [place] PMP: PDMP not reviewed this encounter. Review of the past 36-months conducted.               Department of public safety, offender search: Engineer, mining Information) Non-contributory Risk Assessment Profile: Aberrant behavior: None observed or detected today Risk factors for fatal opioid overdose: None identified today Fatal overdose hazard ratio (HR): Calculation deferred Non-fatal overdose hazard ratio (HR): Calculation deferred Risk of opioid abuse or dependence: 0.7-3.0% with doses ? 36 MME/day and 6.1-26% with doses ? 120 MME/day. Substance use disorder (SUD) risk level: See below Personal History of Substance Abuse (SUD-Substance use disorder):  Alcohol: Negative  Illegal Drugs: Negative  Rx Drugs: Negative  ORT Risk Level calculation: Low Risk  Opioid Risk Tool - 01/27/23 0946       Family History of Substance Abuse   Alcohol Positive Female     Illegal Drugs Negative    Rx Drugs Negative      Personal History of Substance Abuse   Alcohol Negative    Illegal Drugs Negative    Rx Drugs Negative      Age   Age between 16-45 years  No      History of Preadolescent Sexual Abuse   History of Preadolescent Sexual Abuse Negative or Female      Psychological Disease   Depression Negative      Total Score   Opioid Risk Tool Scoring 1    Opioid Risk Interpretation Low Risk            ORT Scoring interpretation table:  Score <3 = Low Risk for SUD  Score between 4-7 = Moderate Risk for SUD  Score >8 = High Risk for Opioid Abuse   PHQ-2 Depression Scale:  Total score: 0  PHQ-2 Scoring interpretation table: (Score and probability of major depressive disorder)  Score 0 = No depression  Score 1 = 15.4% Probability  Score 2 = 21.1% Probability  Score 3 = 38.4% Probability  Score 4 = 45.5% Probability  Score 5 = 56.4% Probability  Score 6 = 78.6% Probability   PHQ-9 Depression Scale:  Total score: 0  PHQ-9 Scoring interpretation table:  Score 0-4 = No depression  Score 5-9 = Mild depression  Score 10-14 = Moderate depression  Score 15-19 = Moderately severe depression  Score 20-27 = Severe depression (2.4 times higher risk of SUD and 2.89 times higher risk of overuse)   Pharmacologic Plan: As per protocol, I have not taken over any controlled substance management, pending the results of ordered tests and/or consults.  Initial impression: Pending review of available data and ordered tests.  Meds   Current Outpatient Medications:    albuterol (PROVENTIL HFA;VENTOLIN HFA) 108 (90 Base) MCG/ACT inhaler, Inhale into the lungs every 6 (six) hours as needed for wheezing or shortness of breath., Disp: , Rfl:    amitriptyline (ELAVIL) 100 MG tablet, Take 100 mg by mouth at bedtime., Disp: , Rfl:    aspirin EC 81 MG tablet, Take 1 tablet (81 mg total) by mouth daily., Disp: 150 tablet, Rfl: 2   atorvastatin  (LIPITOR) 40 MG tablet, Take 40 mg by mouth daily., Disp: , Rfl:    clopidogrel (PLAVIX) 75 MG tablet, TAKE 1 TABLET BY MOUTH DAILY AT 6AM (Patient taking differently: Take 75 mg by mouth daily.), Disp: 30 tablet, Rfl: 6   gabapentin (NEURONTIN) 300 MG capsule, Take 2 capsules (600 mg total) by mouth at bedtime. (Patient taking differently: Take 600 mg by mouth at bedtime. One capsule in the morning, 2 capsules at bedtime), Disp: 60 capsule, Rfl: 3   HYDROcodone-acetaminophen (NORCO/VICODIN) 5-325 MG tablet, Take 1 tablet by mouth 2 (two) times daily as needed for moderate pain., Disp: , Rfl:    hydrOXYzine (ATARAX) 25 MG tablet, TAKE ONE (1) TABLET BY MOUTH TWO TIMES PER DAY AS NEEDED FOR ITCHING, Disp: , Rfl:    Magnesium 400 MG CAPS, Take 1 tablet by mouth daily., Disp: , Rfl:    metolazone (ZAROXOLYN) 5 MG tablet, Take 1 tablet (5 mg) once daily 30 minutes before torsemide for 3-5 days., Disp: , Rfl:    nitroGLYCERIN (NITROSTAT) 0.4 MG SL tablet, Place under the tongue., Disp: , Rfl:    pregabalin (LYRICA) 150 MG capsule, Take 150 mg by mouth 3 (three) times daily., Disp: , Rfl:    tiZANidine (ZANAFLEX) 2 MG tablet, Take 1-2 tablets (2-4 mg total) by mouth at bedtime as needed for muscle spasms., Disp: 60 tablet, Rfl: 1   torsemide (DEMADEX) 20 MG tablet, Take 20 mg by mouth daily., Disp: , Rfl:    TRELEGY ELLIPTA 100-62.5-25 MCG/ACT AEPB, Inhale 1 puff into the lungs daily., Disp: 1 each, Rfl: 3   vitamin B-12 (CYANOCOBALAMIN) 1000 MCG tablet, Take 1,000 mcg by mouth daily., Disp: , Rfl:    Vitamin E 268 MG (400 UNIT) CAPS, Take 400 Units by mouth daily., Disp: , Rfl:    furosemide (LASIX) 20 MG tablet, Take 20 mg by mouth daily. (Patient not taking: Reported on 01/27/2023), Disp: , Rfl:    ipratropium-albuterol (DUONEB) 0.5-2.5 (3) MG/3ML SOLN, Take 3 mLs by nebulization 3 (three) times daily., Disp: 270 mL, Rfl: 0   predniSONE (STERAPRED UNI-PAK 21 TAB) 10 MG (21) TBPK tablet, Start 60 mg po  daily, taper 10 mg daily until finish (Patient not taking: Reported on 01/27/2023), Disp: 21 tablet, Rfl: 0  Imaging Review  Narrative CLINICAL DATA:  Paresthesia of lower extremity  EXAM: MRI THORACIC WITHOUT AND WITH CONTRAST  TECHNIQUE: Multiplanar and multiecho pulse sequences of the thoracic spine were obtained without and with intravenous contrast.  CONTRAST:  5mL GADAVIST GADOBUTROL 1 MMOL/ML IV SOLN  COMPARISON:  Correlation made with lumbar spine MRI June 2023  FINDINGS: Alignment: Accentuation of kyphosis. No significant anteroposterior listhesis.  Vertebrae: Chronic mild loss of height at the superior endplates T1-T4. Chronic L1 compression fracture with osseous retropulsion as seen on prior lumbar MRI. There is no substantial marrow edema. No suspicious osseous lesion.  Cord:  No abnormal signal.  No abnormal intrathecal enhancement.  Paraspinal  and other soft tissues: Unremarkable.  Disc levels: Mild degenerative disc disease with small bulges and protrusions. For example, disc bulges with superimposed left paracentral protrusions at T10-T11 and T12-L1. Facet arthropathy. No significant canal or foraminal stenosis at any level.  IMPRESSION: Chronic upper thoracic compression fractures. Chronic L1 compression fracture. No evidence of recent compression fracture.  Multilevel degenerative changes without significant stenosis.  No abnormal cord signal.   Electronically Signed By: Guadlupe Spanish M.D. On: 02/19/2022 12:35    MR LUMBAR SPINE WO CONTRAST  Narrative CLINICAL DATA:  Bilateral leg pain and weakness.  EXAM: MRI LUMBAR SPINE WITHOUT CONTRAST  TECHNIQUE: Multiplanar, multisequence MR imaging of the lumbar spine was performed. No intravenous contrast was administered.  COMPARISON:  CT scan 04/11/2018  FINDINGS: Segmentation: There are five lumbar type vertebral bodies. The last full intervertebral disc space is labeled  L5-S1.  Alignment: Normal overall alignment of the lumbar vertebral bodies. There is mild degenerative anterolisthesis of L4.  Vertebrae: Remote compression fracture of L1 with stable retropulsion. No new/acute fractures. No worrisome bone lesions.  Conus medullaris and cauda equina: Conus extends to the L1 level. Conus and cauda equina appear normal.  Paraspinal and other soft tissues: 2 small cystic appearing areas near the left iliopsoas muscle. I do not see these for certain on the prior CT scan. The larger lesion measures 2.4 x 2.3 cm in the smaller more posterior lesion measures 10 mm  Infrarenal abdominal aortic aneurysm measuring 3 cm is noted. Recommend follow-up ultrasound every 3 years. This recommendation follows ACR consensus guidelines: White Paper of the ACR Incidental Findings Committee II on Vascular Findings. J Am Coll Radiol 2013; 10:789-794.  Disc levels:  T12-L1: Mild retropulsion involving the posterosuperior aspect of L1 with flattening of the ventral thecal sac but the spinal canal is generous in is no significant spinal stenosis.  L1-2: Mild annular bulge but no focal disc protrusion, spinal or foraminal stenosis.  L2-3: Mild facet disease but no disc protrusions, spinal or foraminal stenosis.  L3-4: Mild facet disease and mild annular bulge but no spinal or foraminal stenosis.  L4-5 mild bulging uncovered disc and moderate facet disease contributing to early spinal and mild bilateral lateral recess stenosis. No foraminal stenosis.  L5-S1: Mild to moderate facet disease but no disc protrusions, spinal or foraminal stenosis.  IMPRESSION: 1. Remote compression fracture of L1 with stable retropulsion involving the posterosuperior aspect of L1 with flattening of the ventral thecal sac but no significant spinal stenosis. 2. No new/acute fractures. 3. Bulging uncovered disc and moderate facet disease at L4-5 contributing to early spinal and mild  bilateral lateral recess stenosis. 4. 3 cm infrarenal abdominal aortic aneurysm. 5. Two cystic lesions near the left psoas muscle of uncertain significance or etiology. I would recommend a follow-up CT scan of the abdomen and pelvis with contrast for further evaluation.   Electronically Signed By: Rudie Meyer M.D. On: 12/20/2021 18:03  CT GUIDED NEEDLE PLACEMENT  Narrative CLINICAL DATA:  Multiloculated right empyema  EXAM: CT GUIDED ASPIRATION BIOPSY OF RIGHT PLEURAL COLLECTION  ANESTHESIA/SEDATION: Intravenous Fentanyl and Versed were administered as conscious sedation during continuous monitoring of the patient's level of consciousness and physiological / cardiorespiratory status by the radiology RN, with a total moderate sedation time of 11 minutes.  PROCEDURE: The procedure risks, benefits, and alternatives were explained to the patient. Questions regarding the procedure were encouraged and answered. The patient understands and consents to the procedure.  Select axial scans through the thorax  were obtained. The right dominant anterolateral collection was localized and an appropriate skin entry site was determined and marked.  The operative field was prepped with chlorhexidinein a sterile fashion, and a sterile drape was applied covering the operative field. A sterile gown and sterile gloves were used for the procedure. Local anesthesia was provided with 1% Lidocaine.  Under CT fluoroscopic guidance, 10 cm 5 French multi sidehole Yueh sheath needle advanced into the dominant anterolateral loculated right pleural collection. Approximately 76 mL of purulent material were aspirated, sample sent for Gram stain and culture. Postprocedure scan shows significant resolution of that component of her pleural process. No pneumothorax. The patient tolerated the procedure well.  COMPLICATIONS: None immediate  FINDINGS: Loculated right anterolateral pleural collection was  localized corresponding to findings on previous CT 04/09/2018. 76 mL purulent material aspirated under CT guidance without complication.  IMPRESSION: 1. Technically successful CT-guided aspiration of loculated right empyema, removing 76 mL purulent material, sent for Gram stain and culture.   Electronically Signed By: Corlis Leak M.D. On: 04/12/2018 13:02   Complexity Note: Imaging results reviewed.                         ROS  Cardiovascular: heart attack, heart failure Pulmonary or Respiratory: Difficulty blowing air out (Emphysema) and Smoking Neurological: No reported neurological signs or symptoms such as seizures, abnormal skin sensations, urinary and/or fecal incontinence, being born with an abnormal open spine and/or a tethered spinal cord Psychological-Psychiatric: Depressed Gastrointestinal: No reported gastrointestinal signs or symptoms such as vomiting or evacuating blood, reflux, heartburn, alternating episodes of diarrhea and constipation, inflamed or scarred liver, or pancreas or irrregular and/or infrequent bowel movements Genitourinary: No reported renal or genitourinary signs or symptoms such as difficulty voiding or producing urine, peeing blood, non-functioning kidney, kidney stones, difficulty emptying the bladder, difficulty controlling the flow of urine, or chronic kidney disease Hematological: No reported hematological signs or symptoms such as prolonged bleeding, low or poor functioning platelets, bruising or bleeding easily, hereditary bleeding problems, low energy levels due to low hemoglobin or being anemic Endocrine: No reported endocrine signs or symptoms such as high or low blood sugar, rapid heart rate due to high thyroid levels, obesity or weight gain due to slow thyroid or thyroid disease Rheumatologic: No reported rheumatological signs and symptoms such as fatigue, joint pain, tenderness, swelling, redness, heat, stiffness, decreased range of motion, with  or without associated rash Musculoskeletal: Negative for myasthenia gravis, muscular dystrophy, multiple sclerosis or malignant hyperthermia Work History: Retired  Allergies  Ms. Housand is allergic to penicillins.  Laboratory Chemistry Profile   Renal Lab Results  Component Value Date   BUN 17 07/14/2022   CREATININE 0.83 07/14/2022   GFRAA >60 04/13/2018   GFRNONAA >60 07/14/2022   PROTEINUR 100 (A) 04/13/2018     Electrolytes Lab Results  Component Value Date   NA 138 07/14/2022   K 4.1 07/14/2022   CL 105 07/14/2022   CALCIUM 8.8 (L) 07/14/2022   MG 2.0 07/14/2022     Hepatic Lab Results  Component Value Date   AST 15 07/13/2022   ALT 15 07/13/2022   ALBUMIN 3.7 07/13/2022   ALKPHOS 106 07/13/2022   LIPASE 23 02/16/2018   AMMONIA 36 (H) 07/13/2022     ID Lab Results  Component Value Date   HIV Non Reactive 05/26/2021   SARSCOV2NAA NEGATIVE 07/13/2022   MRSAPCR NEGATIVE 04/10/2018     Bone Lab Results  Component  Value Date   VD25OH 15.5 (L) 08/12/2015     Endocrine Lab Results  Component Value Date   GLUCOSE 197 (H) 07/14/2022   GLUCOSEU NEGATIVE 04/13/2018   TSH 2.470 08/12/2015     Neuropathy Lab Results  Component Value Date   VITAMINB12 2,626 (H) 04/13/2018   FOLATE 4.9 (L) 04/13/2018   HIV Non Reactive 05/26/2021     CNS No results found for: "COLORCSF", "APPEARCSF", "RBCCOUNTCSF", "WBCCSF", "POLYSCSF", "LYMPHSCSF", "EOSCSF", "PROTEINCSF", "GLUCCSF", "JCVIRUS", "CSFOLI", "IGGCSF", "LABACHR", "ACETBL"   Inflammation (CRP: Acute  ESR: Chronic) Lab Results  Component Value Date   LATICACIDVEN 1.1 07/13/2022     Rheumatology Lab Results  Component Value Date   RF 22.5 (H) 03/10/2018     Coagulation Lab Results  Component Value Date   INR 1.1 07/13/2022   LABPROT 14.0 07/13/2022   APTT 43 (H) 07/13/2022   PLT 181 07/14/2022     Cardiovascular Lab Results  Component Value Date   BNP 109.3 (H) 07/13/2022   CKTOTAL 585 (H)  11/02/2017   TROPONINI 0.05 (HH) 04/09/2018   HGB 13.5 07/14/2022   HCT 43.0 07/14/2022     Screening Lab Results  Component Value Date   SARSCOV2NAA NEGATIVE 07/13/2022   MRSAPCR NEGATIVE 04/10/2018   HIV Non Reactive 05/26/2021     Cancer No results found for: "CEA", "CA125", "LABCA2"   Allergens No results found for: "ALMOND", "APPLE", "ASPARAGUS", "AVOCADO", "BANANA", "BARLEY", "BASIL", "BAYLEAF", "GREENBEAN", "LIMABEAN", "WHITEBEAN", "BEEFIGE", "REDBEET", "BLUEBERRY", "BROCCOLI", "CABBAGE", "MELON", "CARROT", "CASEIN", "CASHEWNUT", "CAULIFLOWER", "CELERY"     Note: Lab results reviewed.  PFSH  Drug: Ms. Willauer  reports no history of drug use. Alcohol:  reports no history of alcohol use. Tobacco:  reports that she has been smoking cigarettes. She has a 10 pack-year smoking history. She has never used smokeless tobacco. Medical:  has a past medical history of (HFpEF) heart failure with preserved ejection fraction (HCC) (06/07/2004), Anxiety, Aortic atherosclerosis (HCC), Asthma, CAD (coronary artery disease) (06/08/2004), COPD (chronic obstructive pulmonary disease) (HCC), Depression, Diverticulosis, DOE (dyspnea on exertion), Empyema of right pleural space (HCC) (02/16/2018), HLD (hyperlipidemia), Hypertension, IDA (iron deficiency anemia), Ischemic cardiomyopathy, NSTEMI (non-ST elevated myocardial infarction) (HCC) (06/05/2004), Osteoporosis, Peripheral vascular disease (HCC), and PONV (postoperative nausea and vomiting). Family: family history includes Breast cancer (age of onset: 45) in an other family member; Breast cancer (age of onset: 54) in her maternal aunt and mother; Dementia in her mother; Diabetes in her father and sister; Heart disease in her father; Hypertension in her father and mother.  Past Surgical History:  Procedure Laterality Date   COLONOSCOPY WITH PROPOFOL N/A 04/12/2017   Procedure: COLONOSCOPY WITH PROPOFOL;  Surgeon: Christena Deem, MD;  Location:  [facility] ENDOSCOPY;  Service: Endoscopy;  Laterality: N/A;   CORONARY ANGIOPLASTY WITH STENT PLACEMENT Left 06/08/2004   Procedure: CORONARY ANGIOPLASTY WITH STENT PLACEMENT; Location: [facility]; Surgeon: Rudean Hitt, MD   ENDARTERECTOMY FEMORAL Bilateral 09/16/2021   Procedure: ENDARTERECTOMY FEMORAL;  Surgeon: Annice Needy, MD;  Location: [facility] ORS;  Service: Vascular;  Laterality: Bilateral;   ENDOBRONCHIAL ULTRASOUND N/A 11/28/2017   Procedure: ENDOBRONCHIAL ULTRASOUND;  Surgeon: Shane Crutch, MD;  Location: [facility] ORS;  Service: Pulmonary;  Laterality: N/A;   INSERTION OF ILIAC STENT Bilateral 09/16/2021   Procedure: INSERTION OF ILIAC STENT;  Surgeon: Annice Needy, MD;  Location: [facility] ORS;  Service: Vascular;  Laterality: Bilateral;   LOWER EXTREMITY ANGIOGRAPHY Right 08/12/2021   Procedure: Lower Extremity Angiography;  Surgeon: Annice Needy, MD;  Location: [facility] INVASIVE CV LAB;  Service: Cardiovascular;  Laterality: Right;   PARTIAL HYSTERECTOMY N/A    REDUCTION MAMMAPLASTY     VIDEO ASSISTED THORACOSCOPY (VATS)/THOROCOTOMY Right 03/2018   Procedure: VIDEO ASSISTED THORACOSCOPY (VATS)/THOROCOTOMY (partial decortication, loculated empyema); Location: [facility]   Active Ambulatory Problems    Diagnosis Date Noted   Acute encephalopathy 11/03/2017   Benzodiazepine withdrawal (HCC) 11/03/2017   Mass of middle lobe of right lung 11/22/2017   Mass of lower lobe of right lung 11/22/2017   Mediastinal adenopathy 11/22/2017   H/O diarrhea 06/14/2017   Abscess of middle lobe of right lung with pneumonia (HCC) 02/16/2018   Pneumonia 04/09/2018   Empyema of right pleural space (HCC)    Hepatic cirrhosis (HCC)    Anemia of chronic disease    Abnormal pleural fluid    COPD exacerbation (HCC) 05/26/2021   Aspiration into respiratory tract 04/17/2018   Anxiety and depression 04/17/2018   At risk for fluid volume overload 04/18/2018   Chronic diastolic CHF (congestive heart failure), NYHA class 3  (HCC) 08/09/2018   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 04/18/2018   Coronary artery disease involving native coronary artery of native heart without angina pectoris 05/08/2018   Dysphagia 04/17/2018   Dyspnea on exertion 04/17/2018   Elevated serum alkaline phosphatase level 08/10/2018   Essential hypertension 12/16/2017   Gastroesophageal reflux disease without esophagitis 03/01/2018   History of empyema of pleura 08/09/2018   IDA (iron deficiency anemia) 04/17/2018   Infection by Streptococcus, viridans group 04/17/2018   Chronic insomnia 08/09/2018   Ischemic cardiomyopathy 04/18/2018   Itching 08/09/2018   Mitral regurgitation 04/18/2018   Other nonspecific abnormal finding of lung field 12/16/2017   Chronic cough 08/09/2018   Postmenopausal 03/29/2018   Pure hypercholesterolemia 03/01/2018   Atherosclerosis of native arteries of extremity with rest pain (HCC) 08/07/2021   Atherosclerosis of artery of extremity with rest pain (HCC) 09/16/2021   Acute on chronic diastolic CHF (congestive heart failure) (HCC) 07/13/2022   Acute metabolic encephalopathy 07/13/2022   HLD (hyperlipidemia) 07/13/2022   CAD (coronary artery disease) 07/13/2022   Depression with anxiety 07/13/2022   Tobacco abuse 07/13/2022   Acute respiratory failure with hypoxia (HCC) 09/22/2022   Altered mental status 09/22/2022   Peripheral vascular disease (HCC) (hx of femoral endarterectomy September 17 2021) 01/27/2023   Neuropathic pain of lower extremity 01/27/2023   Resolved Ambulatory Problems    Diagnosis Date Noted   No Resolved Ambulatory Problems   Past Medical History:  Diagnosis Date   (HFpEF) heart failure with preserved ejection fraction (HCC) 06/07/2004   Anxiety    Aortic atherosclerosis (HCC)    Asthma    COPD (chronic obstructive pulmonary disease) (HCC)    Depression    Diverticulosis    DOE (dyspnea on exertion)    Hypertension    NSTEMI (non-ST elevated  myocardial infarction) (HCC) 06/05/2004   Osteoporosis    PONV (postoperative nausea and vomiting)    Constitutional Exam  General appearance: Well nourished, well developed, and well hydrated. In no apparent acute distress Vitals:   01/27/23 0945  BP: 113/78  Pulse: 87  Resp: 18  Temp: 97.7 F (36.5 C)  TempSrc: Temporal  SpO2: 94%  Weight: 140 lb (63.5 kg)  Height: 5' (1.524 m)   BMI Assessment: Estimated body mass index is 27.34 kg/m as calculated from the following:   Height as of this encounter: 5' (1.524 m).   Weight as of this  encounter: 140 lb (63.5 kg).  BMI interpretation table: BMI level Category Range association with higher incidence of chronic pain  <18 kg/m2 Underweight   18.5-24.9 kg/m2 Ideal body weight   25-29.9 kg/m2 Overweight Increased incidence by 20%  30-34.9 kg/m2 Obese (Class I) Increased incidence by 68%  35-39.9 kg/m2 Severe obesity (Class II) Increased incidence by 136%  >40 kg/m2 Extreme obesity (Class III) Increased incidence by 254%   Patient's current BMI Ideal Body weight  Body mass index is 27.34 kg/m. Ideal body weight: 45.5 kg (100 lb 4.9 oz) Adjusted ideal body weight: 52.7 kg (116 lb 3 oz)   BMI Readings from Last 4 Encounters:  01/27/23 27.34 kg/m  07/15/22 24.59 kg/m  10/29/21 23.42 kg/m  10/05/21 23.72 kg/m   Wt Readings from Last 4 Encounters:  01/27/23 140 lb (63.5 kg)  07/15/22 132 lb 4.4 oz (60 kg)  10/29/21 126 lb (57.2 kg)  10/05/21 127 lb 9.6 oz (57.9 kg)    Psych/Mental status: Alert, oriented x 3 (person, place, & time)       Eyes: PERLA Respiratory: No evidence of acute respiratory distress  Lumbar Spine Area Exam  Skin & Axial Inspection: No masses, redness, or swelling Alignment: Symmetrical Functional ROM: Unrestricted ROM       Stability: No instability detected Muscle Tone/Strength: Functionally intact. No obvious neuro-muscular anomalies detected. Sensory (Neurological): Unimpaired  Gait &  Posture Assessment  Ambulation: Unassisted Gait: Relatively normal for age and body habitus Posture: WNL   Lower Extremity Exam    Side: Right lower extremity  Side: Left lower extremity  Stability: No instability observed          Stability: No instability observed          Skin & Extremity Inspection: Skin color, temperature, and hair growth are WNL. No peripheral edema or cyanosis. No masses, redness, swelling, asymmetry, or associated skin lesions. No contractures.  Skin & Extremity Inspection: Skin color, temperature, and hair growth are WNL. No peripheral edema or cyanosis. No masses, redness, swelling, asymmetry, or associated skin lesions. No contractures.  Functional ROM: Pain restricted ROM for hip joint          Functional ROM: Pain restricted ROM for hip joint          Muscle Tone/Strength: Functionally intact. No obvious neuro-muscular anomalies detected.  Muscle Tone/Strength: Functionally intact. No obvious neuro-muscular anomalies detected.  Sensory (Neurological): Neurogenic pain pattern        Sensory (Neurological): Neurogenic pain pattern        DTR: Patellar: deferred today Achilles: deferred today Plantar: deferred today  DTR: Patellar: deferred today Achilles: deferred today Plantar: deferred today  Palpation: No palpable anomalies  Palpation: No palpable anomalies    Assessment  Primary Diagnosis & Pertinent Problem List: The primary encounter diagnosis was 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. Diagnoses of Peripheral vascular disease (HCC) (hx of femoral endarterectomy September 17 2021), Neuropathic pain of lower extremity, and Chronic pain syndrome were also pertinent to this visit.  Visit Diagnosis (New problems to examiner): 1. 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 Peripheral vascular disease (HCC) (hx of femoral endarterectomy September 17 2021)   3. Neuropathic pain of lower extremity   4. Chronic pain  syndrome    Plan of Care (Initial workup plan)  Note: Ms. Tasker was reminded that as per protocol, today's visit has been an evaluation only. We have not taken over the  patient's controlled substance management.  Lab Orders         Compliance Drug Analysis, Ur      Pharmacotherapy (current): Medications ordered:  Meds ordered this encounter  Medications   tiZANidine (ZANAFLEX) 2 MG tablet    Sig: Take 1-2 tablets (2-4 mg total) by mouth at bedtime as needed for muscle spasms.    Dispense:  60 tablet    Refill:  1    Do not place this medication, or any other prescription from our practice, on "Automatic Refill". Patient may have prescription filled one day early if pharmacy is closed on scheduled refill date.   Medications administered during this visit: Lamya L. Gabrys had no medications administered during this visit.   Future medication options include trial of buprenorphine. We also discussed spinal cord stimulation for complex regional pain syndrome. Continue gabapentin and Lyrica as prescribed by neurology.  Provider-requested follow-up: Return in about 12 days (around 02/08/2023) for 2nd pt visit (discuss [place]).  Future Appointments  Date Time Provider Department Center  02/08/2023 11:40 AM Edward Jolly, MD [facility] None    Duration of encounter: .  Total time on encounter, as per AMA guidelines included both the face-to-face and non-face-to-face time personally spent by the physician and/or other qualified health care professional(s) on the day of the encounter (includes time in activities that require the physician or other qualified health care professional and does not include time in activities normally performed by clinical staff). Physician's time may include the following activities when performed: Preparing to see the patient (e.g., pre-charting review of records, searching for previously ordered imaging, lab work, and nerve conduction tests) Review of  prior analgesic pharmacotherapies. Reviewing PMP Interpreting ordered tests (e.g., lab work, imaging, nerve conduction tests) Performing post-procedure evaluations, including interpretation of diagnostic procedures Obtaining and/or reviewing separately obtained history Performing a medically appropriate examination and/or evaluation Counseling and educating the patient/family/caregiver Ordering medications, tests, or procedures Referring and communicating with other health care professionals (when not separately reported) Documenting clinical information in the electronic or other health record Independently interpreting results (not separately reported) and communicating results to the patient/ family/caregiver Care coordination (not separately reported)  Note by: Edward Jolly, MD (TTS technology used. I apologize for any typographical errors that were not detected and corrected.) Date: 01/27/2023; Time: 11:30 AM

## 2023-01-31 ENCOUNTER — Other Ambulatory Visit: Payer: Self-pay

## 2023-01-31 ENCOUNTER — Emergency Department: Payer: HMO

## 2023-01-31 ENCOUNTER — Inpatient Hospital Stay
Admission: EM | Admit: 2023-01-31 | Discharge: 2023-02-04 | DRG: 562 | Disposition: A | Discharge status: Home Health | Payer: HMO | Attending: Internal Medicine | Admitting: Internal Medicine

## 2023-01-31 ENCOUNTER — Encounter: Payer: Self-pay | Admitting: Emergency Medicine

## 2023-01-31 DIAGNOSIS — Z7902 Long term (current) use of antithrombotics/antiplatelets: Secondary | ICD-10-CM

## 2023-01-31 DIAGNOSIS — N179 Acute kidney failure, unspecified: Secondary | ICD-10-CM | POA: Diagnosis not present

## 2023-01-31 DIAGNOSIS — K7460 Unspecified cirrhosis of liver: Secondary | ICD-10-CM | POA: Diagnosis present

## 2023-01-31 DIAGNOSIS — S82852D Displaced trimalleolar fracture of left lower leg, subsequent encounter for closed fracture with routine healing: Secondary | ICD-10-CM | POA: Diagnosis not present

## 2023-01-31 DIAGNOSIS — I5031 Acute diastolic (congestive) heart failure: Secondary | ICD-10-CM | POA: Diagnosis not present

## 2023-01-31 DIAGNOSIS — I110 Hypertensive heart disease with heart failure: Secondary | ICD-10-CM | POA: Diagnosis present

## 2023-01-31 DIAGNOSIS — E860 Dehydration: Secondary | ICD-10-CM | POA: Diagnosis not present

## 2023-01-31 DIAGNOSIS — F17210 Nicotine dependence, cigarettes, uncomplicated: Secondary | ICD-10-CM | POA: Diagnosis not present

## 2023-01-31 DIAGNOSIS — R609 Edema, unspecified: Secondary | ICD-10-CM | POA: Diagnosis not present

## 2023-01-31 DIAGNOSIS — D509 Iron deficiency anemia, unspecified: Secondary | ICD-10-CM | POA: Diagnosis present

## 2023-01-31 DIAGNOSIS — Z5986 Financial insecurity: Secondary | ICD-10-CM

## 2023-01-31 DIAGNOSIS — M85862 Other specified disorders of bone density and structure, left lower leg: Secondary | ICD-10-CM | POA: Diagnosis not present

## 2023-01-31 DIAGNOSIS — D638 Anemia in other chronic diseases classified elsewhere: Secondary | ICD-10-CM | POA: Diagnosis not present

## 2023-01-31 DIAGNOSIS — R55 Syncope and collapse: Secondary | ICD-10-CM | POA: Diagnosis not present

## 2023-01-31 DIAGNOSIS — S82852A Displaced trimalleolar fracture of left lower leg, initial encounter for closed fracture: Secondary | ICD-10-CM | POA: Diagnosis not present

## 2023-01-31 DIAGNOSIS — I5032 Chronic diastolic (congestive) heart failure: Secondary | ICD-10-CM | POA: Diagnosis not present

## 2023-01-31 DIAGNOSIS — I255 Ischemic cardiomyopathy: Secondary | ICD-10-CM | POA: Diagnosis not present

## 2023-01-31 DIAGNOSIS — I951 Orthostatic hypotension: Secondary | ICD-10-CM | POA: Diagnosis present

## 2023-01-31 DIAGNOSIS — W19XXXA Unspecified fall, initial encounter: Secondary | ICD-10-CM | POA: Diagnosis present

## 2023-01-31 DIAGNOSIS — Z5329 Procedure and treatment not carried out because of patient's decision for other reasons: Secondary | ICD-10-CM | POA: Diagnosis present

## 2023-01-31 DIAGNOSIS — E876 Hypokalemia: Secondary | ICD-10-CM | POA: Diagnosis present

## 2023-01-31 DIAGNOSIS — G5773 Causalgia of bilateral lower limbs: Secondary | ICD-10-CM | POA: Diagnosis present

## 2023-01-31 DIAGNOSIS — Z8249 Family history of ischemic heart disease and other diseases of the circulatory system: Secondary | ICD-10-CM | POA: Diagnosis not present

## 2023-01-31 DIAGNOSIS — I70229 Atherosclerosis of native arteries of extremities with rest pain, unspecified extremity: Secondary | ICD-10-CM | POA: Diagnosis not present

## 2023-01-31 DIAGNOSIS — E785 Hyperlipidemia, unspecified: Secondary | ICD-10-CM | POA: Diagnosis not present

## 2023-01-31 DIAGNOSIS — Z79899 Other long term (current) drug therapy: Secondary | ICD-10-CM

## 2023-01-31 DIAGNOSIS — I2510 Atherosclerotic heart disease of native coronary artery without angina pectoris: Secondary | ICD-10-CM | POA: Diagnosis present

## 2023-01-31 DIAGNOSIS — Z803 Family history of malignant neoplasm of breast: Secondary | ICD-10-CM

## 2023-01-31 DIAGNOSIS — I739 Peripheral vascular disease, unspecified: Secondary | ICD-10-CM | POA: Diagnosis present

## 2023-01-31 DIAGNOSIS — F0394 Unspecified dementia, unspecified severity, with anxiety: Secondary | ICD-10-CM | POA: Diagnosis not present

## 2023-01-31 DIAGNOSIS — J441 Chronic obstructive pulmonary disease with (acute) exacerbation: Secondary | ICD-10-CM

## 2023-01-31 DIAGNOSIS — J9601 Acute respiratory failure with hypoxia: Secondary | ICD-10-CM | POA: Diagnosis not present

## 2023-01-31 DIAGNOSIS — F03918 Unspecified dementia, unspecified severity, with other behavioral disturbance: Secondary | ICD-10-CM | POA: Diagnosis not present

## 2023-01-31 DIAGNOSIS — S82892A Other fracture of left lower leg, initial encounter for closed fracture: Secondary | ICD-10-CM | POA: Diagnosis not present

## 2023-01-31 DIAGNOSIS — G5772 Causalgia of left lower limb: Secondary | ICD-10-CM | POA: Diagnosis not present

## 2023-01-31 DIAGNOSIS — Z7982 Long term (current) use of aspirin: Secondary | ICD-10-CM

## 2023-01-31 DIAGNOSIS — Z5989 Other problems related to housing and economic circumstances: Secondary | ICD-10-CM | POA: Diagnosis not present

## 2023-01-31 DIAGNOSIS — Z90710 Acquired absence of both cervix and uterus: Secondary | ICD-10-CM

## 2023-01-31 DIAGNOSIS — Z833 Family history of diabetes mellitus: Secondary | ICD-10-CM

## 2023-01-31 DIAGNOSIS — S8252XA Displaced fracture of medial malleolus of left tibia, initial encounter for closed fracture: Secondary | ICD-10-CM | POA: Diagnosis not present

## 2023-01-31 DIAGNOSIS — F32A Depression, unspecified: Secondary | ICD-10-CM | POA: Diagnosis present

## 2023-01-31 DIAGNOSIS — Z955 Presence of coronary angioplasty implant and graft: Secondary | ICD-10-CM

## 2023-01-31 DIAGNOSIS — I10 Essential (primary) hypertension: Secondary | ICD-10-CM | POA: Diagnosis present

## 2023-01-31 DIAGNOSIS — J449 Chronic obstructive pulmonary disease, unspecified: Secondary | ICD-10-CM | POA: Diagnosis not present

## 2023-01-31 DIAGNOSIS — I700 Atherosclerosis of aorta: Secondary | ICD-10-CM | POA: Diagnosis present

## 2023-01-31 DIAGNOSIS — Z880 Allergy status to penicillin: Secondary | ICD-10-CM

## 2023-01-31 DIAGNOSIS — I5033 Acute on chronic diastolic (congestive) heart failure: Secondary | ICD-10-CM | POA: Diagnosis not present

## 2023-01-31 DIAGNOSIS — F329 Major depressive disorder, single episode, unspecified: Secondary | ICD-10-CM | POA: Diagnosis not present

## 2023-01-31 DIAGNOSIS — G5771 Causalgia of right lower limb: Secondary | ICD-10-CM | POA: Diagnosis present

## 2023-01-31 DIAGNOSIS — M85872 Other specified disorders of bone density and structure, left ankle and foot: Secondary | ICD-10-CM | POA: Diagnosis not present

## 2023-01-31 DIAGNOSIS — F0393 Unspecified dementia, unspecified severity, with mood disturbance: Secondary | ICD-10-CM | POA: Diagnosis present

## 2023-01-31 DIAGNOSIS — T502X5A Adverse effect of carbonic-anhydrase inhibitors, benzothiadiazides and other diuretics, initial encounter: Secondary | ICD-10-CM | POA: Diagnosis present

## 2023-01-31 DIAGNOSIS — Z01810 Encounter for preprocedural cardiovascular examination: Secondary | ICD-10-CM | POA: Diagnosis not present

## 2023-01-31 DIAGNOSIS — F5104 Psychophysiologic insomnia: Secondary | ICD-10-CM | POA: Diagnosis present

## 2023-01-31 DIAGNOSIS — I252 Old myocardial infarction: Secondary | ICD-10-CM

## 2023-01-31 LAB — BASIC METABOLIC PANEL
Anion gap: 17 — ABNORMAL HIGH (ref 5–15)
BUN: 54 mg/dL — ABNORMAL HIGH (ref 8–23)
CO2: 36 mmol/L — ABNORMAL HIGH (ref 22–32)
Calcium: 10.2 mg/dL (ref 8.9–10.3)
Chloride: 85 mmol/L — ABNORMAL LOW (ref 98–111)
Creatinine, Ser: 1.54 mg/dL — ABNORMAL HIGH (ref 0.44–1.00)
GFR, Estimated: 36 mL/min — ABNORMAL LOW (ref >60)
Glucose, Bld: 91 mg/dL (ref 70–99)
Potassium: 2.9 mmol/L — ABNORMAL LOW (ref 3.5–5.1)
Sodium: 138 mmol/L (ref 135–145)

## 2023-01-31 LAB — CBC
HCT: 44.6 % (ref 36.0–46.0)
Hemoglobin: 14.6 g/dL (ref 12.0–15.0)
MCH: 28.2 pg (ref 26.0–34.0)
MCHC: 32.7 g/dL (ref 30.0–36.0)
MCV: 86.3 fL (ref 80.0–100.0)
Platelets: 203 10*3/uL (ref 150–400)
RBC: 5.17 MIL/uL — ABNORMAL HIGH (ref 3.87–5.11)
RDW: 14.5 % (ref 11.5–15.5)
WBC: 9.1 10*3/uL (ref 4.0–10.5)
nRBC: 0 % (ref 0.0–0.2)

## 2023-01-31 LAB — BRAIN NATRIURETIC PEPTIDE: B Natriuretic Peptide: 55.3 pg/mL (ref 0.0–100.0)

## 2023-01-31 LAB — TROPONIN I (HIGH SENSITIVITY): Troponin I (High Sensitivity): 10 ng/L (ref <18)

## 2023-01-31 LAB — POTASSIUM: Potassium: 2.8 mmol/L — ABNORMAL LOW (ref 3.5–5.1)

## 2023-01-31 LAB — MAGNESIUM: Magnesium: 2.2 mg/dL (ref 1.7–2.4)

## 2023-01-31 MED ORDER — IPRATROPIUM-ALBUTEROL 0.5-2.5 (3) MG/3ML IN SOLN
3.0000 mL | Freq: Four times a day (QID) | RESPIRATORY_TRACT | Status: DC | PRN
  Filled 2023-01-31: qty 3

## 2023-01-31 MED ORDER — HYDROMORPHONE HCL 1 MG/ML IJ SOLN
1.0000 mg | INTRAMUSCULAR | Status: DC | PRN
  Administered 2023-01-31 – 2023-02-02 (×7): 1 mg via INTRAVENOUS
  Filled 2023-01-31 (×7): qty 1

## 2023-01-31 MED ORDER — LACTATED RINGERS IV BOLUS
1000.0000 mL | Freq: Once | INTRAVENOUS | Status: AC
  Administered 2023-01-31: 1000 mL via INTRAVENOUS

## 2023-01-31 MED ORDER — IPRATROPIUM-ALBUTEROL 0.5-2.5 (3) MG/3ML IN SOLN
3.0000 mL | Freq: Once | RESPIRATORY_TRACT | Status: AC
  Administered 2023-01-31: 3 mL via RESPIRATORY_TRACT
  Filled 2023-01-31: qty 3

## 2023-01-31 MED ORDER — CLOPIDOGREL BISULFATE 75 MG PO TABS
75.0000 mg | ORAL_TABLET | Freq: Every day | ORAL | Status: DC
  Administered 2023-02-01 – 2023-02-04 (×4): 75 mg via ORAL
  Filled 2023-01-31 (×4): qty 1

## 2023-01-31 MED ORDER — POTASSIUM CHLORIDE 10 MEQ/100ML IV SOLN
10.0000 meq | INTRAVENOUS | Status: AC
  Administered 2023-01-31 (×2): 10 meq via INTRAVENOUS
  Filled 2023-01-31 (×2): qty 100

## 2023-01-31 MED ORDER — PREDNISONE 20 MG PO TABS
60.0000 mg | ORAL_TABLET | Freq: Once | ORAL | Status: AC
  Administered 2023-01-31: 60 mg via ORAL
  Filled 2023-01-31: qty 3

## 2023-01-31 MED ORDER — POTASSIUM CHLORIDE CRYS ER 20 MEQ PO TBCR
40.0000 meq | EXTENDED_RELEASE_TABLET | Freq: Once | ORAL | Status: AC
  Administered 2023-01-31: 40 meq via ORAL
  Filled 2023-01-31: qty 2

## 2023-01-31 MED ORDER — HEPARIN SODIUM (PORCINE) 5000 UNIT/ML IJ SOLN
5000.0000 [IU] | Freq: Three times a day (TID) | INTRAMUSCULAR | Status: DC
  Administered 2023-01-31 – 2023-02-04 (×7): 5000 [IU] via SUBCUTANEOUS
  Filled 2023-01-31 (×11): qty 1

## 2023-01-31 MED ORDER — AMITRIPTYLINE HCL 50 MG PO TABS
100.0000 mg | ORAL_TABLET | Freq: Every day | ORAL | Status: DC
  Administered 2023-01-31 – 2023-02-03 (×4): 100 mg via ORAL
  Filled 2023-01-31 (×2): qty 4
  Filled 2023-01-31 (×2): qty 2
  Filled 2023-01-31: qty 4
  Filled 2023-01-31 (×2): qty 2
  Filled 2023-01-31: qty 4

## 2023-01-31 MED ORDER — SODIUM CHLORIDE 0.9 % IV SOLN: INTRAVENOUS | Status: DC

## 2023-01-31 MED ORDER — TIZANIDINE HCL 2 MG PO TABS
2.0000 mg | ORAL_TABLET | Freq: Every evening | ORAL | Status: DC | PRN
  Administered 2023-02-01 – 2023-02-03 (×4): 4 mg via ORAL
  Filled 2023-01-31 (×7): qty 2

## 2023-01-31 MED ORDER — MORPHINE SULFATE (PF) 4 MG/ML IV SOLN
4.0000 mg | Freq: Once | INTRAVENOUS | Status: AC
  Administered 2023-01-31: 4 mg via INTRAVENOUS
  Filled 2023-01-31: qty 1

## 2023-01-31 MED ORDER — ASPIRIN 81 MG PO TBEC
81.0000 mg | DELAYED_RELEASE_TABLET | Freq: Every day | ORAL | Status: DC
  Administered 2023-02-01 – 2023-02-04 (×4): 81 mg via ORAL
  Filled 2023-01-31 (×4): qty 1

## 2023-01-31 MED ORDER — IPRATROPIUM-ALBUTEROL 0.5-2.5 (3) MG/3ML IN SOLN
3.0000 mL | Freq: Three times a day (TID) | RESPIRATORY_TRACT | Status: DC
  Administered 2023-01-31 – 2023-02-04 (×11): 3 mL via RESPIRATORY_TRACT
  Filled 2023-01-31 (×9): qty 3

## 2023-01-31 MED ORDER — POTASSIUM CHLORIDE 20 MEQ PO PACK
40.0000 meq | PACK | ORAL | Status: AC
  Administered 2023-01-31 – 2023-02-01 (×2): 40 meq via ORAL
  Filled 2023-01-31 (×2): qty 2

## 2023-01-31 MED ORDER — ATORVASTATIN CALCIUM 20 MG PO TABS
40.0000 mg | ORAL_TABLET | Freq: Every day | ORAL | Status: DC
  Administered 2023-02-01 – 2023-02-04 (×4): 40 mg via ORAL
  Filled 2023-01-31 (×4): qty 2

## 2023-01-31 MED ORDER — PREGABALIN 75 MG PO CAPS
150.0000 mg | ORAL_CAPSULE | Freq: Three times a day (TID) | ORAL | Status: DC
  Administered 2023-01-31 – 2023-02-01 (×3): 150 mg via ORAL
  Filled 2023-01-31 (×3): qty 2

## 2023-01-31 MED ORDER — HYDROCODONE-ACETAMINOPHEN 5-325 MG PO TABS
1.0000 | ORAL_TABLET | Freq: Two times a day (BID) | ORAL | Status: DC | PRN
  Administered 2023-01-31 – 2023-02-01 (×2): 1 via ORAL
  Filled 2023-01-31 (×2): qty 1

## 2023-01-31 MED ORDER — GABAPENTIN 300 MG PO CAPS
600.0000 mg | ORAL_CAPSULE | Freq: Two times a day (BID) | ORAL | Status: DC
  Administered 2023-01-31 – 2023-02-01 (×2): 600 mg via ORAL
  Filled 2023-01-31 (×2): qty 2

## 2023-01-31 NOTE — ED Provider Notes (Signed)
[facility] Provider Note    Event Date/Time   First MD Initiated Contact with Patient 01/31/23 1527     (approximate)   History   Chief Complaint Loss of Consciousness   HPI  NIMISHA RADICE is a 72 y.o. female with past medical history of hypertension, hyperlipidemia, CAD, CHF, COPD, cirrhosis, and dementia who presents to the ED following near syncope.  Patient was reportedly at home attempting to stand up from a chair when she began to feel very lightheaded and dizzy.  She felt like she was going to pass out and ended up falling to the ground onto her left side, but denies fully losing consciousness.  She denies hitting her head, but now complains of significant pain to her left ankle and has been unable to bear weight.  She denies any chest pain or difficulty breathing with the episode, has never had similar episodes in the past.  Family states that patient has been having trouble breathing recently, although patient denies this.     Physical Exam   Triage Vital Signs: ED Triage Vitals  Encounter Vitals Group     BP 01/31/23 1400 (!) 112/91     Systolic BP Percentile --      Diastolic BP Percentile --      Pulse Rate 01/31/23 1400 77     Resp 01/31/23 1400 18     Temp 01/31/23 1404 98.1 F (36.7 C)     Temp Source 01/31/23 1404 Oral     SpO2 01/31/23 1400 (!) 86 %     Weight 01/31/23 1357 140 lb (63.5 kg)     Height 01/31/23 1357 5' (1.524 m)     Head Circumference --      Peak Flow --      Pain Score 01/31/23 1357 7     Pain Loc --      Pain Education --      Exclude from Growth Chart --     Most recent vital signs: Vitals:   01/31/23 1541 01/31/23 1600  BP: 126/73 130/69  Pulse: 82 80  Resp: 16 16  Temp:    SpO2: 91% 92%    Constitutional: Alert and oriented. Eyes: Conjunctivae are normal. Head: Atraumatic. Nose: No congestion/rhinnorhea. Mouth/Throat: Mucous membranes are moist.  Neck: No midline cervical spine tenderness to  palpation. Cardiovascular: Normal rate, regular rhythm. Grossly normal heart sounds.  2+ radial and DP pulses bilaterally. Respiratory: Normal respiratory effort.  No retractions. Lungs with expiratory wheezing throughout.  No chest wall tenderness to palpation noted. Gastrointestinal: Soft and nontender. No distention. Musculoskeletal: Diffuse tenderness to palpation of left ankle with no obvious deformity, no tenderness to palpation at left knee or hip.  No right lower extremity tenderness to palpation or upper extremity tenderness to palpation. Neurologic:  Normal speech and language. No gross focal neurologic deficits are appreciated.    ED Results / Procedures / Treatments   Labs (all labs ordered are listed, but only abnormal results are displayed) Labs Reviewed  BASIC METABOLIC PANEL - Abnormal; Notable for the following components:      Result Value   Potassium 2.9 (*)    Chloride 85 (*)    CO2 36 (*)    BUN 54 (*)    Creatinine, Ser 1.54 (*)    GFR, Estimated 36 (*)    Anion gap 17 (*)    All other components within normal limits  CBC - Abnormal; Notable for the following components:  RBC 5.17 (*)    All other components within normal limits  BRAIN NATRIURETIC PEPTIDE  MAGNESIUM  URINALYSIS, ROUTINE W REFLEX MICROSCOPIC  CBG MONITORING, ED  TROPONIN I (HIGH SENSITIVITY)     EKG  ED ECG REPORT I, Chesley Noon, the attending physician, personally viewed and interpreted this ECG.   Date: 01/31/2023  EKG Time: 14:07  Rate: 80  Rhythm: normal sinus rhythm  Axis: Normal  Intervals:none  ST&T Change: None  RADIOLOGY Ankle x-ray reviewed and interpreted by me with trimalleolar fracture, minimally displaced with no dislocation.  PROCEDURES:  Critical Care performed: No  Procedures   MEDICATIONS ORDERED IN ED: Medications  potassium chloride 10 mEq in 100 mL IVPB (10 mEq Intravenous New Bag/Given 01/31/23 1650)  ipratropium-albuterol (DUONEB) 0.5-2.5 (3)  MG/3ML nebulizer solution 3 mL (has no administration in time range)  predniSONE (DELTASONE) tablet 60 mg (has no administration in time range)  potassium chloride SA (KLOR-CON M) CR tablet 40 mEq (has no administration in time range)  lactated ringers bolus 1,000 mL (1,000 mLs Intravenous New Bag/Given 01/31/23 1649)  morphine (PF) 4 MG/ML injection 4 mg (4 mg Intravenous Given 01/31/23 1644)     IMPRESSION / MDM / ASSESSMENT AND PLAN / ED COURSE  I reviewed the triage vital signs and the nursing notes.                              72 y.o. female with past medical history of hypertension, hyperlipidemia, COPD, CAD, CHF, cirrhosis, dementia who presents to the ED with near syncopal episode at home, falling onto her left ankle.  Patient's presentation is most consistent with acute presentation with potential threat to life or bodily function.  Differential diagnosis includes, but is not limited to, arrhythmia, vasovagal episode, orthostatic hypotension, anemia, electrolyte abnormality, AKI, ACS, ankle injury, COPD exacerbation.  Patient nontoxic-appearing and in no acute distress, vital signs initially remarkable for hypoxia in triage, although according to recent discharge summary patient is supposed to be on 2 L nasal cannula chronically.  Hypoxia improved with application of 2 L nasal cannula, patient does have some pursed lip breathing with expiratory wheezing throughout.  We will treat for apparent COPD exacerbation with prednisone and DuoNeb, chest x-ray is unremarkable.  She does have trimalleolar fracture of her left ankle, remains neurovascular intact distally, plan to discuss with orthopedics and placed in U-splint with posterior slab.  No apparent injury to her head, neck, or upper extremities.  Labs show AKI with associated hyperkalemia, will hydrate with IV fluids and replete potassium.  No significant anemia or leukocytosis noted, troponin within normal limits.  After speaking with Dr.  Audelia Acton of orthopedics initially, I spoke with Dr. Ether Griffins of podiatry who will follow for ankle injury, agrees with plan for splint placement for now.  Patient breathing more comfortably following DuoNeb and prednisone, suspect mild COPD exacerbation.  Case discussed with hospitalist for admission.      FINAL CLINICAL IMPRESSION(S) / ED DIAGNOSES   Final diagnoses:  Near syncope  COPD exacerbation (HCC)  Closed trimalleolar fracture of left ankle, initial encounter     Rx / DC Orders   ED Discharge Orders     None        Note:  This document was prepared using Dragon voice recognition software and may include unintentional dictation errors.   Chesley Noon, MD 01/31/23 [phone]

## 2023-01-31 NOTE — H&P (Addendum)
History and Physical    Patient: Wendy Arias DOB: May 28, 1951 DOA: 01/31/2023 DOS: the patient was seen and examined on 01/31/2023 PCP: Wilford Corner, PA-C  Patient coming from: Home  Chief Complaint: Fall Chief Complaint  Patient presents with   Loss of Consciousness   HPI: Wendy Arias is a 72 y.o. female with medical history significant of COPD not on home oxygen, depression, complex regional pain syndrome, heart failure with preserved ejection fraction, coronary artery disease status post stent, hyperlipidemia, hypertension, peripheral vascular disease who was otherwise well until this afternoon when she was sitting down with family and decided to get up to the restroom and not long afterwards experienced a near syncopal episode falling down and landed on her buttocks.  According to patient she did not lose consciousness and did not hit in the head.  She immediately started feeling left ankle pain with intensity 10/10 with difficulty walking and therefore brought to the emergency room for further management.  Upon arrival patient underwent x-ray of the left ankle that showed a trimalleolar fracture.  Podiatry was therefore contacted who agreed for patient to be admitted for further management.  In the emergency room lab reviewed showed findings of AKI.  Patient also appeared clinically dehydrated.  Review of Systems: As mentioned in the history of present illness. All other systems reviewed and are negative. Past Medical History:  Diagnosis Date   (HFpEF) heart failure with preserved ejection fraction (HCC) 06/07/2004   a.) TTE 06/07/2004: EF 50-55%; inf HK. b.) TTE 04/17/2018: EF 45%; GLS -14.6%; inferoseptal and inferoposterior HK; sev LA enlargement; mild PR, mod TR, sev MR; G2DD. c.) TTE 12/05/2018: EF >55%, mild LVH; mild MR. d.) TTE 12/18/2020: EF >55%; mild LA enlargement; triv TR/PR, mod MR; G1DD.   Anxiety    Aortic atherosclerosis (HCC)    Asthma    CAD  (coronary artery disease) 06/08/2004   a.) NSTEMI 06/05/2004. b.) LHC 06/08/2004: EF 70%; 50% mLAD, 50% dLAD, 99% OM3, 50% pRCA, 50% mRCA; PCI to OM3 placing a DES x 1 (unknown type). c.) Lexi scan 09/14/2021: normal LV function with no ischemia or scar.   COPD (chronic obstructive pulmonary disease) (HCC)    Depression    Diverticulosis    DOE (dyspnea on exertion)    Empyema of right pleural space (HCC) 02/16/2018   HLD (hyperlipidemia)    Hypertension    IDA (iron deficiency anemia)    Ischemic cardiomyopathy    NSTEMI (non-ST elevated myocardial infarction) (HCC) 06/05/2004   a.)  PCI on 06/08/2004 placing a DES x1 (unknown type) to 99% lesion in OM3.   Osteoporosis    Peripheral vascular disease (HCC)    PONV (postoperative nausea and vomiting)    Past Surgical History:  Procedure Laterality Date   COLONOSCOPY WITH PROPOFOL N/A 04/12/2017   Procedure: COLONOSCOPY WITH PROPOFOL;  Surgeon: Christena Deem, MD;  Location: [facility] ENDOSCOPY;  Service: Endoscopy;  Laterality: N/A;   CORONARY ANGIOPLASTY WITH STENT PLACEMENT Left 06/08/2004   Procedure: CORONARY ANGIOPLASTY WITH STENT PLACEMENT; Location: [facility]; Surgeon: Rudean Hitt, MD   ENDARTERECTOMY FEMORAL Bilateral 09/16/2021   Procedure: ENDARTERECTOMY FEMORAL;  Surgeon: Annice Needy, MD;  Location: [facility] ORS;  Service: Vascular;  Laterality: Bilateral;   ENDOBRONCHIAL ULTRASOUND N/A 11/28/2017   Procedure: ENDOBRONCHIAL ULTRASOUND;  Surgeon: Shane Crutch, MD;  Location: [facility] ORS;  Service: Pulmonary;  Laterality: N/A;   INSERTION OF ILIAC STENT Bilateral 09/16/2021   Procedure: INSERTION OF ILIAC STENT;  Surgeon:  Annice Needy, MD;  Location: [facility] ORS;  Service: Vascular;  Laterality: Bilateral;   LOWER EXTREMITY ANGIOGRAPHY Right 08/12/2021   Procedure: Lower Extremity Angiography;  Surgeon: Annice Needy, MD;  Location: [facility] INVASIVE CV LAB;  Service: Cardiovascular;  Laterality: Right;   PARTIAL HYSTERECTOMY N/A     REDUCTION MAMMAPLASTY     VIDEO ASSISTED THORACOSCOPY (VATS)/THOROCOTOMY Right 03/2018   Procedure: VIDEO ASSISTED THORACOSCOPY (VATS)/THOROCOTOMY (partial decortication, loculated empyema); Location: [facility]   Social History:  reports that she has been smoking cigarettes. She has a 10 pack-year smoking history. She has never used smokeless tobacco. She reports that she does not drink alcohol and does not use drugs.  Allergies  Allergen Reactions   Penicillins Other (See Comments)    Has patient had a PCN reaction causing immediate rash, facial/tongue/throat swelling, SOB or lightheadedness with hypotension: Yes Has patient had a PCN reaction causing severe rash involving mucus membranes or skin necrosis: Unknown Has patient had a PCN reaction that required hospitalization: Unknown Has patient had a PCN reaction occurring within the last 10 years: Unknown If all of the above answers are "NO", then may proceed with Cephalosporin use. Pt has taken augmentin before    Family History  Problem Relation Age of Onset   Breast cancer Mother 14   Hypertension Mother    Dementia Mother    Breast cancer Maternal Aunt 52   Breast cancer Other 35   Hypertension Father    Heart disease Father    Diabetes Father    Diabetes Sister     Prior to Admission medications   Medication Sig Start Date End Date Taking? Authorizing Provider  albuterol (PROVENTIL HFA;VENTOLIN HFA) 108 (90 Base) MCG/ACT inhaler Inhale into the lungs every 6 (six) hours as needed for wheezing or shortness of breath.    [provider]  amitriptyline (ELAVIL) 100 MG tablet Take 100 mg by mouth at bedtime. 06/02/22 06/02/23  [provider]  aspirin EC 81 MG tablet Take 1 tablet (81 mg total) by mouth daily. 08/12/21   Annice Needy, MD  atorvastatin (LIPITOR) 40 MG tablet Take 40 mg by mouth daily. 05/18/21   [provider]  clopidogrel (PLAVIX) 75 MG tablet TAKE 1 TABLET BY MOUTH DAILY AT 6AM Patient  taking differently: Take 75 mg by mouth daily. 04/30/22   Georgiana Spinner, NP  furosemide (LASIX) 20 MG tablet Take 20 mg by mouth daily. Patient not taking: Reported on 01/27/2023 09/02/22   [provider]  gabapentin (NEURONTIN) 300 MG capsule Take 2 capsules (600 mg total) by mouth at bedtime. Patient taking differently: Take 600 mg by mouth at bedtime. One capsule in the morning, 2 capsules at bedtime 10/29/21   Georgiana Spinner, NP  HYDROcodone-acetaminophen (NORCO/VICODIN) 5-325 MG tablet Take 1 tablet by mouth 2 (two) times daily as needed for moderate pain. 07/01/22   [provider]  hydrOXYzine (ATARAX) 25 MG tablet TAKE ONE (1) TABLET BY MOUTH TWO TIMES PER DAY AS NEEDED FOR ITCHING 01/26/23   [provider]  ipratropium-albuterol (DUONEB) 0.5-2.5 (3) MG/3ML SOLN Take 3 mLs by nebulization 3 (three) times daily. 07/15/22 08/14/22  Delfino Lovett, MD  Magnesium 400 MG CAPS Take 1 tablet by mouth daily.    [provider]  metolazone (ZAROXOLYN) 5 MG tablet Take 1 tablet (5 mg) once daily 30 minutes before torsemide for 3-5 days. 01/20/23   [provider]  nitroGLYCERIN (NITROSTAT) 0.4 MG SL tablet Place under the  tongue. 01/20/23 01/20/24  [provider]  predniSONE (STERAPRED UNI-PAK 21 TAB) 10 MG (21) TBPK tablet Start 60 mg po daily, taper 10 mg daily until finish Patient not taking: Reported on 01/27/2023 07/15/22   Delfino Lovett, MD  pregabalin (LYRICA) 150 MG capsule Take 150 mg by mouth 3 (three) times daily. 07/05/22   [provider]  tiZANidine (ZANAFLEX) 2 MG tablet Take 1-2 tablets (2-4 mg total) by mouth at bedtime as needed for muscle spasms. 01/27/23 03/28/23  Edward Jolly, MD  torsemide (DEMADEX) 20 MG tablet Take 20 mg by mouth daily.    [provider]  TRELEGY ELLIPTA 100-62.5-25 MCG/ACT AEPB Inhale 1 puff into the lungs daily. 05/30/21   Enedina Finner, MD  vitamin B-12 (CYANOCOBALAMIN) 1000 MCG tablet Take 1,000 mcg by mouth  daily.    [provider]  Vitamin E 268 MG (400 UNIT) CAPS Take 400 Units by mouth daily.    [provider]    Physical Exam:  Vitals:   01/31/23 1400 01/31/23 1404 01/31/23 1541 01/31/23 1600  BP: (!) 112/91  126/73 130/69  Pulse: 77  82 80  Resp: 18  16 16   Temp:  98.1 F (36.7 C)    TempSrc:  Oral    SpO2: (!) 86% 91% 91% 92%  Weight:      Height:       General: In some distress on account of the left ankle pain Respiratory: Not in obvious distress breathing not labored Cardio vascular: Heart sounds 1 and 2 present no murmur appreciated Abdomen: Full nondistended nontender CNS: Alert and oriented x 3 nonfocal Musculoskeletal: Tenderness to the left ankle dressing in place clean and dry  Data Reviewed: I have reviewed patient's CBC, BMP results showing worsening renal function, I have also reviewed patient's x-ray involving the left ankle area showing displaced trimalleolar fracture with intra-articular extension  Assessment and Plan:  Acute displaced trimalleolar fracture of the left ankle with intra-articular extension Podiatry is consulted in the emergency room we appreciate input Plan of care discussed with ED physician Pain management with Dilaudid as needed I will keep n.p.o. after midnight in case surgery is planned I personally reviewed the patient's imaging of the left ankle showing displaced trimalleolar fracture  Acute kidney injury secondary to dehydration in the setting of diuretic use Patient creatinine at baseline is within normal limit however presented with creatinine 1.54 We will place on IV fluid hydration Monitor renal function closely Avoid nephrotoxic agents Renally dose all drugs   Near syncope likely secondary to dehydration with possible underlying orthostatic hypotension Monitor orthostatic vitals closely Continue IV fluid for dehydration  Severe hypokalemia Presented with potassium 2.2 likely secondary to diuretic  effect Patient received IV potassium as well as oral potassium repletion in the ER I will repeat potassium levels We will continue to monitor closely  COPD not on home oxygen,  Currently not in acute exacerbation Continue as needed nebulization Data saturation closely  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 Continue amitriptyline  Complex regional pain syndrome,  Patient follows up with an outpatient pain physician Takes Norco, gabapentin as well as Lyrica which have been resumed   Chronic congestive heart failure with preserved ejection fraction,  Last echo was in January 2024 that showed ejection fraction 55 to 60% with diastolic function indeterminate Continue monitoring fluid status closely Daily weight Holding diuretics at this time on account of dehydration  Coronary artery disease status post stent,  Continue aspirin and statin  therapy  Hyperlipidemia,  Continue statin therapy  Hypertension,  Holding antihypertensives at this time on account of relative hypotension  Peripheral vascular disease  Continue aspirin and statin therapy    Advance Care Planning:   Code Status: Prior patient initially was opting for DNR however after discussing with family she has agreed to at least undergo resuscitation and intubation but would not like to be prolonged. Patient is therefore full code at this time  Consults: Podiatry  Family Communication: Discussed with patient's sister as well as granddaughter present at bedside  Severity of Illness: The appropriate patient status for this patient is INPATIENT. Inpatient status is judged to be reasonable and necessary in order to provide the required intensity of service to ensure the patient's safety. The patient's presenting symptoms, physical exam findings, and initial radiographic and laboratory data in the context of their chronic comorbidities is felt to place them at high risk for further clinical deterioration.  Furthermore, it is not anticipated that the patient will be medically stable for discharge from the hospital within 2 midnights of admission.   * I certify that at the point of admission it is my clinical judgment that the patient will require inpatient hospital care spanning beyond 2 midnights from the point of admission due to high intensity of service, high risk for further deterioration and high frequency of surveillance required.*  Author: Loyce Dys, MD 01/31/2023 5:36 PM  For on call review [email or web address].

## 2023-01-31 NOTE — ED Triage Notes (Signed)
Patient to ED via [facility] from home for a syncopal episode. Patient states she was walking when she passed out. States she did not hit her head, does take Plavix. Right hand bruising noted. C/o left knee and ankle pain.  Patient O2 86% on RA. This RN placed patient on 2L [place]- 91% on 2L.

## 2023-01-31 NOTE — Consult Note (Signed)
PHARMACY CONSULT NOTE - ELECTROLYTES  Pharmacy Consult for Electrolyte Monitoring and Replacement   Recent Labs: Height: 5' (152.4 cm) Weight: 63.5 kg (140 lb) IBW/kg (Calculated) : 45.5 Estimated Creatinine Clearance: 27.5 mL/min (A) (by C-G formula based on SCr of 1.54 mg/dL (H)). Potassium (mmol/L)  Date Value  01/31/2023 2.8 (L)  10/31/2013 4.5   Magnesium (mg/dL)  Date Value  82/95/6213 2.2   Calcium (mg/dL)  Date Value  08/65/7846 10.2   Calcium, Total (mg/dL)  Date Value  96/29/5284 9.1   Albumin (g/dL)  Date Value  13/24/4010 3.7  10/31/2013 4.0   Sodium (mmol/L)  Date Value  01/31/2023 138  10/31/2013 138    Assessment  Wendy Arias is a 72 y.o. female presenting with loss of consciousness. PMH significant for COPD, HFpEF, CAD, HLD, HTN, PVD. Pharmacy has been consulted to monitor and replace electrolytes.  Diet: regular MIVF: NS @ 75 mL/hr  Goal of Therapy: Electrolytes WNL  Plan:  Give KCl 40 mEq PO x2 Check BMP, Mg with AM labs  Thank you for allowing pharmacy to be a part of this patient's care.  Celene Squibb, PharmD Clinical Pharmacist 01/31/2023 9:17 PM

## 2023-01-31 NOTE — Plan of Care (Signed)

## 2023-02-01 ENCOUNTER — Inpatient Hospital Stay: Payer: HMO

## 2023-02-01 DIAGNOSIS — R55 Syncope and collapse: Secondary | ICD-10-CM | POA: Diagnosis not present

## 2023-02-01 DIAGNOSIS — J441 Chronic obstructive pulmonary disease with (acute) exacerbation: Secondary | ICD-10-CM | POA: Diagnosis not present

## 2023-02-01 DIAGNOSIS — S82852A Displaced trimalleolar fracture of left lower leg, initial encounter for closed fracture: Secondary | ICD-10-CM | POA: Diagnosis not present

## 2023-02-01 MED ORDER — SENNOSIDES-DOCUSATE SODIUM 8.6-50 MG PO TABS
2.0000 | ORAL_TABLET | Freq: Every day | ORAL | Status: DC
  Administered 2023-02-01 – 2023-02-02 (×2): 2 via ORAL
  Filled 2023-02-01 (×3): qty 2

## 2023-02-01 MED ORDER — VITAMIN K1 10 MG/ML IJ SOLN
5.0000 mg | Freq: Once | INTRAMUSCULAR | Status: AC
  Administered 2023-02-01: 5 mg via SUBCUTANEOUS
  Filled 2023-02-01: qty 0.5

## 2023-02-01 MED ORDER — PREGABALIN 75 MG PO CAPS
150.0000 mg | ORAL_CAPSULE | Freq: Two times a day (BID) | ORAL | Status: DC
  Administered 2023-02-01 – 2023-02-03 (×4): 150 mg via ORAL
  Filled 2023-02-01 (×4): qty 2

## 2023-02-01 MED ORDER — PREDNISONE 20 MG PO TABS
40.0000 mg | ORAL_TABLET | Freq: Every day | ORAL | Status: DC
  Administered 2023-02-02 – 2023-02-04 (×3): 40 mg via ORAL
  Filled 2023-02-01 (×3): qty 2

## 2023-02-01 MED ORDER — GABAPENTIN 300 MG PO CAPS
600.0000 mg | ORAL_CAPSULE | Freq: Every day | ORAL | Status: DC
  Administered 2023-02-02 – 2023-02-03 (×2): 600 mg via ORAL
  Filled 2023-02-01 (×2): qty 2

## 2023-02-01 NOTE — Evaluation (Signed)
Occupational Therapy Evaluation Patient Details Name: Wendy Arias MRN: [id number] DOB: 29-Dec-1950 Today's Date: 02/01/2023   History of Present Illness Pt admitted for L LE ankle fx. Pt elected conservative management at this time. History includes COPD, cardiomyopathy and CRPS.   Clinical Impression   Patient presenting with decreased Ind in self care,balance, functional mobility/transfers, endurance, and safety awareness. Patient reports being mod I at baseline and living with several family members who are able to assist at discharge. She does need assistance to step into/out of shower which family has been assisting her with at baseline. Session limited secondary to active bleeding from abdomen. Max A for bed mobility and min A to chang gown while supine. EOB and standing attempts pt declines at this time. Patient will benefit from acute OT to increase overall independence in the areas of ADLs, functional mobility, and safety awareness in order to safely discharge.    Recommendations for follow up therapy are one component of a multi-disciplinary discharge planning process, led by the attending physician.  Recommendations may be updated based on patient status, additional functional criteria and insurance authorization.   Assistance Recommended at Discharge Intermittent Supervision/Assistance  Patient can return home with the following A lot of help with walking and/or transfers;A lot of help with bathing/dressing/bathroom;Assistance with cooking/housework;Assist for transportation;Help with stairs or ramp for entrance    Functional Status Assessment  Patient has had a recent decline in their functional status and demonstrates the ability to make significant improvements in function in a reasonable and predictable amount of time.  Equipment Recommendations  BSC/3in1 (drop arm)       Precautions / Restrictions Precautions Precautions: Fall Restrictions Weight Bearing Restrictions:  Yes LLE Weight Bearing: Non weight bearing      Mobility Bed Mobility Overal bed mobility: Needs Assistance Bed Mobility: Rolling Rolling: Max assist              Transfers                   General transfer comment: not attempted secondary to pain and increased bleeding          ADL either performed or assessed with clinical judgement   ADL Overall ADL's : Needs assistance/impaired                                       General ADL Comments: Pt needing min A to change soiled hospital gown in bed.     Vision Baseline Vision/History: 1 Wears glasses Patient Visual Report: No change from baseline              Pertinent Vitals/Pain Pain Assessment Pain Assessment: Faces Faces Pain Scale: Hurts whole lot Pain Location: L ankle Pain Descriptors / Indicators: Aching, Discomfort, Grimacing, Guarding Pain Intervention(s): Limited activity within patient's tolerance, Premedicated before session, Repositioned     Extremity/Trunk Assessment Upper Extremity Assessment Upper Extremity Assessment: Generalized weakness   Lower Extremity Assessment Lower Extremity Assessment: Generalized weakness       Communication Communication Communication: No difficulties   Cognition Arousal/Alertness: Awake/alert Behavior During Therapy: WFL for tasks assessed/performed Overall Cognitive Status: Within Functional Limits for tasks assessed  Home Living Family/patient expects to be discharged to:: Private residence Living Arrangements: Other relatives Available Help at Discharge: Family;Available 24 hours/day (daughter and SIL) Type of Home: House Home Access: Level entry     Home Layout: One level     Bathroom Shower/Tub: Tub/shower unit         Home Equipment: Agricultural consultant (2 wheels);Building control surveyor (4 wheels)          Prior Functioning/Environment Prior  Level of Function : Independent/Modified Independent             Mobility Comments: reports no other falls and uses rollator ADLs Comments: independent with self care needs but does have assistance to get into and out of shower for safety        OT Problem List: Decreased strength;Decreased range of motion;Decreased activity tolerance;Decreased safety awareness;Impaired balance (sitting and/or standing);Decreased knowledge of use of DME or AE;Decreased knowledge of precautions;Pain      OT Treatment/Interventions: Self-care/ADL training;Therapeutic exercise;Therapeutic activities;Energy conservation;DME and/or AE instruction;Patient/family education;Balance training    OT Goals(Current goals can be found in the care plan section) Acute Rehab OT Goals Patient Stated Goal: to decrease pain and return home OT Goal Formulation: With patient/family Time For Goal Achievement: 02/15/23 Potential to Achieve Goals: Fair ADL Goals Pt Will Perform Grooming: with supervision;sitting Pt Will Perform Lower Body Dressing: with min assist;sitting/lateral leans Pt Will Transfer to Toilet: with min assist;stand pivot transfer Pt Will Perform Toileting - Clothing Manipulation and hygiene: with mod assist;sitting/lateral leans  OT Frequency: Min 1X/week       AM-PAC OT "6 Clicks" Daily Activity     Outcome Measure Help from another person eating meals?: None Help from another person taking care of personal grooming?: None Help from another person toileting, which includes using toliet, bedpan, or urinal?: A Lot Help from another person bathing (including washing, rinsing, drying)?: A Lot Help from another person to put on and taking off regular upper body clothing?: A Little Help from another person to put on and taking off regular lower body clothing?: A Lot 6 Click Score: 17   End of Session Equipment Utilized During Treatment: Oxygen Nurse Communication: Mobility status  Activity  Tolerance: Patient tolerated treatment well Patient left: in bed;with call bell/phone within reach  OT Visit Diagnosis: Unsteadiness on feet (R26.81);Muscle weakness (generalized) (M62.81);History of falling (Z91.81)                Time: 1610-9604 OT Time Calculation (min): 21 min Charges:  OT General Charges $OT Visit: 1 Visit OT Evaluation $OT Eval Moderate Complexity: 1 Mod OT Treatments $Therapeutic Activity: 8-22 mins  Jackquline Denmark, MS, OTR/L , CBIS ascom [phone]  02/01/23, 4:10 PM

## 2023-02-01 NOTE — Evaluation (Signed)
Physical Therapy Evaluation Patient Details Name: Wendy Arias MRN: [id number] DOB: 07/24/1950 Today's Date: 02/01/2023  History of Present Illness  Pt admitted for L LE ankle fx. Pt elected conservative management at this time. History includes COPD, cardiomyopathy and CRPS.  Clinical Impression  Pt is a pleasant 72 year old female who was admitted for L ankle Fx currently being managed conservatively at this time. Pt performs bed mobility with mod assist and unable to ambulate at this time. Pt with NWB on L LE and reports given her history of CRPS it would be difficult to maintain WBing status during mobility efforts. Pt demonstrates deficits with strength/mobility/pain. Would benefit from skilled PT to address above deficits and promote optimal return to PLOF. Pt will continue to receive skilled PT services while admitted and will defer to TOC/care team for updates regarding disposition planning.       If plan is discharge home, recommend the following: A lot of help with walking and/or transfers;A lot of help with bathing/dressing/bathroom;Help with stairs or ramp for entrance;Assist for transportation   Can travel by private vehicle   No    Equipment Recommendations Wheelchair (measurements PT);BSC/3in1  Recommendations for Other Services       Functional Status Assessment Patient has had a recent decline in their functional status and demonstrates the ability to make significant improvements in function in a reasonable and predictable amount of time.     Precautions / Restrictions Precautions Precautions: Fall Restrictions Weight Bearing Restrictions: Yes LLE Weight Bearing: Non weight bearing      Mobility  Bed Mobility Overal bed mobility: Needs Assistance Bed Mobility: Supine to Sit, Sit to Supine     Supine to sit: Mod assist Sit to supine: Mod assist   General bed mobility comments: needs assist for sequencing and movement of hips/ L LE towards R side of bed. Once  seated, able to sit without LOB. Increased pain noted with leg in dependent position. Noted active bleeding. RN called and assisted with transition back to supine to address bleeding    Transfers                   General transfer comment: NT, secondary to pain/weakness    Ambulation/Gait               General Gait Details: unable to perform  Stairs            Wheelchair Mobility     Tilt Bed    Modified Rankin (Stroke Patients Only)       Balance Overall balance assessment: Needs assistance Sitting-balance support: Feet supported, Bilateral upper extremity supported Sitting balance-Leahy Scale: Good                                       Pertinent Vitals/Pain Pain Assessment Pain Assessment: 0-10 Pain Score: 8  Pain Location: L ankle Pain Descriptors / Indicators: Aching, Discomfort, Grimacing Pain Intervention(s): Limited activity within patient's tolerance, Repositioned, Patient requesting pain meds-RN notified, RN gave pain meds during session    Home Living Family/patient expects to be discharged to:: Private residence Living Arrangements: Other relatives Available Help at Discharge: Family;Available 24 hours/day (daughter and SIL) Type of Home: House Home Access: Level entry       Home Layout: One level Home Equipment: Agricultural consultant (2 wheels);Transport chair      Prior Function Prior Level of Function :  Independent/Modified Independent             Mobility Comments: reports no other falls, typically indep' ADLs Comments: indep     Hand Dominance        Extremity/Trunk Assessment   Upper Extremity Assessment Upper Extremity Assessment: Generalized weakness (B UE grossly 4/5)    Lower Extremity Assessment Lower Extremity Assessment: Generalized weakness (R LE grossly 3/5; L LE grossly 2/5)       Communication   Communication: No difficulties  Cognition Arousal/Alertness: Awake/alert Behavior  During Therapy: WFL for tasks assessed/performed Overall Cognitive Status: Within Functional Limits for tasks assessed                                          General Comments      Exercises Other Exercises Other Exercises: rolling to B sides with min assist for gown/mesh undie change.   Assessment/Plan    PT Assessment Patient needs continued PT services  PT Problem List Decreased strength;Decreased activity tolerance;Decreased balance;Decreased mobility;Pain       PT Treatment Interventions DME instruction;Gait training;Therapeutic exercise;Balance training    PT Goals (Current goals can be found in the Care Plan section)  Acute Rehab PT Goals Patient Stated Goal: to go home PT Goal Formulation: With patient Time For Goal Achievement: 02/15/23 Potential to Achieve Goals: Good    Frequency Min 1X/week     Co-evaluation               AM-PAC PT "6 Clicks" Mobility  Outcome Measure Help needed turning from your back to your side while in a flat bed without using bedrails?: A Lot Help needed moving from lying on your back to sitting on the side of a flat bed without using bedrails?: A Lot Help needed moving to and from a bed to a chair (including a wheelchair)?: Total Help needed standing up from a chair using your arms (e.g., wheelchair or bedside chair)?: Total Help needed to walk in hospital room?: Total Help needed climbing 3-5 steps with a railing? : Total 6 Click Score: 8    End of Session Equipment Utilized During Treatment: Oxygen Activity Tolerance: Patient limited by pain Patient left: in bed;with bed alarm set Nurse Communication: Mobility status PT Visit Diagnosis: Unsteadiness on feet (R26.81);Muscle weakness (generalized) (M62.81);History of falling (Z91.81);Difficulty in walking, not elsewhere classified (R26.2);Pain Pain - Right/Left: Left Pain - part of body: Ankle and joints of foot    Time: 1610-9604 PT Time Calculation  (min) (ACUTE ONLY): 21 min   Charges:   PT Evaluation $PT Eval Low Complexity: 1 Low PT Treatments $Therapeutic Activity: 8-22 mins PT General Charges $$ ACUTE PT VISIT: 1 Visit         Elizabeth Palau, PT, DPT, GCS [phone]   , 02/01/2023, 10:57 AM

## 2023-02-01 NOTE — Consult Note (Signed)
PHARMACY CONSULT NOTE - ELECTROLYTES  Pharmacy Consult for Electrolyte Monitoring and Replacement   Recent Labs: Height: 5' (152.4 cm) Weight: 63.5 kg (140 lb) IBW/kg (Calculated) : 45.5 Estimated Creatinine Clearance: 25.5 mL/min (A) (by C-G formula based on SCr of 1.66 mg/dL (H)). Potassium (mmol/L)  Date Value  02/01/2023 4.4  10/31/2013 4.5   Magnesium (mg/dL)  Date Value  16/03/9603 2.4   Calcium (mg/dL)  Date Value  54/02/8118 9.1   Calcium, Total (mg/dL)  Date Value  14/78/2956 9.1   Albumin (g/dL)  Date Value  21/30/8657 3.7  10/31/2013 4.0   Sodium (mmol/L)  Date Value  02/01/2023 135  10/31/2013 138    Assessment  Wendy Arias is a 72 y.o. female presenting with loss of consciousness. PMH significant for COPD, HFpEF, CAD, HLD, HTN, PVD. Pharmacy has been consulted to monitor and replace electrolytes.  Diet: regular MIVF: NS @ 75 mL/hr Medications Home torsemide not restarted Patient no longer taking furosemide   Goal of Therapy: Electrolytes WNL  Plan:  Electrolyte replacement is not needed today Continue to check BMP, Mg with AM labs  Thank you for allowing pharmacy to be a part of this patient's care.  Effie Shy, PharmD PGY1 Pharmacy Resident 02/01/2023 11:52 AM

## 2023-02-01 NOTE — TOC Progression Note (Signed)
Transition of Care [facility]) - Progression Note    Patient Details  Name: Wendy Arias MRN: [id number] Date of Birth: 1950-08-12  Transition of [facility]) CM/SW Contact  Marlowe Sax, RN Phone Number: 02/01/2023, 2:40 PM  Clinical Narrative:    Spoke with the patient and arranged HH PT , OT with [facility].  She refises to go to [facility] She will need a Wheelchair, [employer] to deliver to the bedside Her son that she lives with will transport her home   Expected Discharge Plan: Home w Home Health Services Barriers to Discharge: Continued Medical Work up  Expected Discharge Plan and Services   Discharge Planning Services: CM Consult   Living arrangements for the past 2 months: Single Family Home                 DME Arranged: Wheelchair manual DME Agency: [employer] Date DME Agency Contacted: 01/25/23 Time DME Agency Contacted: [phone] Representative spoke with at DME Agency: [facility] HH Arranged: PT, OT HH Agency: [facility] Date [facility] Agency Contacted: 02/01/23 Time HH Agency Contacted: [phone] Representative spoke with at [facility] Agency: Adelina Mings   Social Determinants of Health (SDOH) Interventions SDOH Screenings   Food Insecurity: No Food Insecurity (01/31/2023)  Housing: Low Risk  (01/31/2023)  Transportation Needs: No Transportation Needs (01/31/2023)  Utilities: Not At Risk (01/31/2023)  Depression (PHQ2-9): Low Risk  (01/27/2023)  Financial Resource Strain: High Risk (02/16/2018)  Physical Activity: Inactive (02/16/2018)  Social Connections: Unknown (02/16/2018)  Stress: Stress Concern Present (02/16/2018)  Tobacco Use: High Risk (01/31/2023)    Readmission Risk Interventions    07/14/2022   11:37 AM  Readmission Risk Prevention Plan  Transportation Screening Complete  PCP or Specialist Appt within 5-7 Days Complete  Home Care Screening Complete  Medication Review (RN CM) Complete

## 2023-02-01 NOTE — Progress Notes (Signed)
Patient suffers from acute fracture of the left ankle which impairs their ability to perform daily activities like bathing, moving, cooking, grooming in the home.  A walker will not resolve  issue with performing activities of daily living. A wheelchair will allow patient to safely perform daily activities. Patient is not able to propel themselves in the home using a standard weight wheelchair due to general weakness. Patient can self propel in the lightweight wheelchair. Length of need 6 months .  Accessories: elevating leg rests (ELRs), wheel locks, extensions and anti-tippers.

## 2023-02-01 NOTE — Consult Note (Signed)
ORTHOPAEDIC CONSULTATION  REQUESTING PHYSICIAN: Loyce Dys, MD  Chief Complaint: Left ankle fracture  HPI: Wendy Arias is a 72 y.o. female who complains of left ankle injury.  Had a syncopal episode yesterday.  Complaining of immediate left ankle pain.  Brought to the ER and trimalleolar ankle fracture was diagnosed.  Patient be admitted for the syncopal episode as well as evaluation of the ankle fracture.  Patient with longstanding history of peripheral vascular disease status post femoral endarterectomy.  Also history of COPD and heart failure/ischemic cardiomyopathy.  Recently evaluated in the outpatient clinic by cardiology and repeat echo was recommended at that time.  History of CRPS status post endarterectomy and is seen in pain management routinely.  Past Medical History:  Diagnosis Date   (HFpEF) heart failure with preserved ejection fraction (HCC) 06/07/2004   a.) TTE 06/07/2004: EF 50-55%; inf HK. b.) TTE 04/17/2018: EF 45%; GLS -14.6%; inferoseptal and inferoposterior HK; sev LA enlargement; mild PR, mod TR, sev MR; G2DD. c.) TTE 12/05/2018: EF >55%, mild LVH; mild MR. d.) TTE 12/18/2020: EF >55%; mild LA enlargement; triv TR/PR, mod MR; G1DD.   Anxiety    Aortic atherosclerosis (HCC)    Asthma    CAD (coronary artery disease) 06/08/2004   a.) NSTEMI 06/05/2004. b.) LHC 06/08/2004: EF 70%; 50% mLAD, 50% dLAD, 99% OM3, 50% pRCA, 50% mRCA; PCI to OM3 placing a DES x 1 (unknown type). c.) Lexi scan 09/14/2021: normal LV function with no ischemia or scar.   COPD (chronic obstructive pulmonary disease) (HCC)    Depression    Diverticulosis    DOE (dyspnea on exertion)    Empyema of right pleural space (HCC) 02/16/2018   HLD (hyperlipidemia)    Hypertension    IDA (iron deficiency anemia)    Ischemic cardiomyopathy    NSTEMI (non-ST elevated myocardial infarction) (HCC) 06/05/2004   a.)  PCI on 06/08/2004 placing a DES x1 (unknown type) to 99% lesion in OM3.   Osteoporosis     Peripheral vascular disease (HCC)    PONV (postoperative nausea and vomiting)    Past Surgical History:  Procedure Laterality Date   COLONOSCOPY WITH PROPOFOL N/A 04/12/2017   Procedure: COLONOSCOPY WITH PROPOFOL;  Surgeon: Christena Deem, MD;  Location: [facility] ENDOSCOPY;  Service: Endoscopy;  Laterality: N/A;   CORONARY ANGIOPLASTY WITH STENT PLACEMENT Left 06/08/2004   Procedure: CORONARY ANGIOPLASTY WITH STENT PLACEMENT; Location: [facility]; Surgeon: Rudean Hitt, MD   ENDARTERECTOMY FEMORAL Bilateral 09/16/2021   Procedure: ENDARTERECTOMY FEMORAL;  Surgeon: Annice Needy, MD;  Location: [facility] ORS;  Service: Vascular;  Laterality: Bilateral;   ENDOBRONCHIAL ULTRASOUND N/A 11/28/2017   Procedure: ENDOBRONCHIAL ULTRASOUND;  Surgeon: Shane Crutch, MD;  Location: [facility] ORS;  Service: Pulmonary;  Laterality: N/A;   INSERTION OF ILIAC STENT Bilateral 09/16/2021   Procedure: INSERTION OF ILIAC STENT;  Surgeon: Annice Needy, MD;  Location: [facility] ORS;  Service: Vascular;  Laterality: Bilateral;   LOWER EXTREMITY ANGIOGRAPHY Right 08/12/2021   Procedure: Lower Extremity Angiography;  Surgeon: Annice Needy, MD;  Location: [facility] INVASIVE CV LAB;  Service: Cardiovascular;  Laterality: Right;   PARTIAL HYSTERECTOMY N/A    REDUCTION MAMMAPLASTY     VIDEO ASSISTED THORACOSCOPY (VATS)/THOROCOTOMY Right 03/2018   Procedure: VIDEO ASSISTED THORACOSCOPY (VATS)/THOROCOTOMY (partial decortication, loculated empyema); Location: [facility]   Social History   Socioeconomic History   Marital status: Divorced    Spouse name: Not on file   Number of children: Not on file   Years of  education: Not on file   Highest education level: Not on file  Occupational History   Not on file  Tobacco Use   Smoking status: Some Days    Current packs/day: 0.50    Average packs/day: 0.5 packs/day for 20.0 years (10.0 ttl pk-yrs)    Types: Cigarettes   Smokeless tobacco: Never  Vaping Use   Vaping status: Never Used   Substance and Sexual Activity   Alcohol use: Never    Alcohol/week: 3.0 standard drinks of alcohol    Types: 1 Glasses of wine, 2 Cans of beer per week    Comment: rarely   Drug use: Never   Sexual activity: Not Currently  Other Topics Concern   Not on file  Social History Narrative   Not on file   Social Determinants of Health   Financial Resource Strain: High Risk (02/16/2018)   Overall Financial Resource Strain (CARDIA)    Difficulty of Paying Living Expenses: Very hard  Food Insecurity: No Food Insecurity (01/31/2023)   Hunger Vital Sign    Worried About Running Out of Food in the Last Year: Never true    Ran Out of Food in the Last Year: Never true  Transportation Needs: No Transportation Needs (01/31/2023)   PRAPARE - Administrator, Civil Service (Medical): No    Lack of Transportation (Non-Medical): No  Physical Activity: Inactive (02/16/2018)   Exercise Vital Sign    Days of Exercise per Week: 0 days    Minutes of Exercise per Session: 0 min  Stress: Stress Concern Present (02/16/2018)   [employer] of Occupational Health - Occupational Stress Questionnaire    Feeling of Stress : Rather much  Social Connections: Unknown (02/16/2018)   Social Connection and Isolation Panel [NHANES]    Frequency of Communication with Friends and Family: More than three times a week    Frequency of Social Gatherings with Friends and Family: More than three times a week    Attends Religious Services: Patient declined    Database administrator or Organizations: Patient declined    Attends Banker Meetings: Patient declined    Marital Status: Divorced   Family History  Problem Relation Age of Onset   Breast cancer Mother 96   Hypertension Mother    Dementia Mother    Breast cancer Maternal Aunt 29   Breast cancer Other 35   Hypertension Father    Heart disease Father    Diabetes Father    Diabetes Sister    Allergies  Allergen Reactions   Penicillins  Other (See Comments)    Has patient had a PCN reaction causing immediate rash, facial/tongue/throat swelling, SOB or lightheadedness with hypotension: Yes Has patient had a PCN reaction causing severe rash involving mucus membranes or skin necrosis: Unknown Has patient had a PCN reaction that required hospitalization: Unknown Has patient had a PCN reaction occurring within the last 10 years: Unknown If all of the above answers are "NO", then may proceed with Cephalosporin use. Pt has taken augmentin before   Prior to Admission medications   Medication Sig Start Date End Date Taking? Authorizing Provider  amitriptyline (ELAVIL) 100 MG tablet Take 100 mg by mouth at bedtime. 06/02/22 06/02/23 Yes [provider]  aspirin EC 81 MG tablet Take 1 tablet (81 mg total) by mouth daily. 08/12/21  Yes Dew, Marlow Baars, MD  atorvastatin (LIPITOR) 40 MG tablet Take 40 mg by mouth daily. 05/18/21  Yes [provider]  clopidogrel (  PLAVIX) 75 MG tablet TAKE 1 TABLET BY MOUTH DAILY AT 6AM Patient taking differently: Take 75 mg by mouth daily. 04/30/22  Yes Georgiana Spinner, NP  gabapentin (NEURONTIN) 300 MG capsule Take 2 capsules (600 mg total) by mouth at bedtime. Patient taking differently: Take 600 mg by mouth 2 (two) times daily. One capsule in the morning, 2 capsules at bedtime 10/29/21  Yes Georgiana Spinner, NP  Magnesium 400 MG CAPS Take 1 tablet by mouth daily.   Yes [provider]  metolazone (ZAROXOLYN) 5 MG tablet Take 1 tablet (5 mg) once daily 30 minutes before torsemide for 3-5 days. 01/20/23  Yes [provider]  pregabalin (LYRICA) 150 MG capsule Take 150 mg by mouth 3 (three) times daily. 07/05/22  Yes [provider]  torsemide (DEMADEX) 20 MG tablet Take 20 mg by mouth daily.   Yes [provider]  vitamin B-12 (CYANOCOBALAMIN) 1000 MCG tablet Take 1,000 mcg by mouth daily.   Yes [provider]  Vitamin E 268 MG (400 UNIT) CAPS Take 400  Units by mouth daily.   Yes [provider]  albuterol (PROVENTIL HFA;VENTOLIN HFA) 108 (90 Base) MCG/ACT inhaler Inhale into the lungs every 6 (six) hours as needed for wheezing or shortness of breath.    [provider]  furosemide (LASIX) 20 MG tablet Take 20 mg by mouth daily. Patient not taking: Reported on 01/27/2023 09/02/22   [provider]  HYDROcodone-acetaminophen (NORCO/VICODIN) 5-325 MG tablet Take 1 tablet by mouth 2 (two) times daily as needed for moderate pain. 07/01/22   [provider]  hydrOXYzine (ATARAX) 25 MG tablet TAKE ONE (1) TABLET BY MOUTH TWO TIMES PER DAY AS NEEDED FOR ITCHING 01/26/23   [provider]  ipratropium-albuterol (DUONEB) 0.5-2.5 (3) MG/3ML SOLN Take 3 mLs by nebulization 3 (three) times daily. 07/15/22 08/14/22  Delfino Lovett, MD  nitroGLYCERIN (NITROSTAT) 0.4 MG SL tablet Place under the tongue. 01/20/23 01/20/24  [provider]  predniSONE (STERAPRED UNI-PAK 21 TAB) 10 MG (21) TBPK tablet Start 60 mg po daily, taper 10 mg daily until finish Patient not taking: Reported on 01/27/2023 07/15/22   Delfino Lovett, MD  tiZANidine (ZANAFLEX) 2 MG tablet Take 1-2 tablets (2-4 mg total) by mouth at bedtime as needed for muscle spasms. 01/27/23 03/28/23  Edward Jolly, MD  TRELEGY ELLIPTA 100-62.5-25 MCG/ACT AEPB Inhale 1 puff into the lungs daily. 05/30/21   Enedina Finner, MD   DG Chest Portable 1 View  Result Date: 01/31/2023 CLINICAL DATA:  Syncope EXAM: PORTABLE CHEST 1 VIEW COMPARISON:  Chest radiograph dated 07/13/2022 FINDINGS: Normal lung volumes. No focal consolidations. No pleural effusion or pneumothorax. The heart size and mediastinal contours are within normal limits. No radiographic finding of acute displaced fracture. IMPRESSION: 1. No radiographic finding of acute displaced fracture. 2.  No focal consolidations. Electronically Signed   By: Agustin Cree M.D.   On: 01/31/2023 16:20   DG Knee Complete 4 Views Left  Result  Date: 01/31/2023 CLINICAL DATA:  fall EXAM: LEFT KNEE - COMPLETE 4+ VIEW; LEFT ANKLE COMPLETE - 3+ VIEW COMPARISON:  None Available. FINDINGS: There is diffuse osteopenia of the visualized osseous structures. Mildly displaced fractures of the lateral malleolus, medial malleolus and posterior malleolus noted with intra-articular extension. There is mild widening of the medial clear space. No other acute fracture or dislocation. No aggressive osseous lesion. Knee joint space is preserved on this nonweightbearing views. No significant osteophytosis. No knee effusion. No radiopaque  foreign bodies. IMPRESSION: 1. Mildly displaced trimalleolar fracture with intra-articular extension. 2. Diffuse osteopenia. Electronically Signed   By: Jules Schick M.D.   On: 01/31/2023 15:24   DG Ankle Complete Left  Result Date: 01/31/2023 CLINICAL DATA:  fall EXAM: LEFT KNEE - COMPLETE 4+ VIEW; LEFT ANKLE COMPLETE - 3+ VIEW COMPARISON:  None Available. FINDINGS: There is diffuse osteopenia of the visualized osseous structures. Mildly displaced fractures of the lateral malleolus, medial malleolus and posterior malleolus noted with intra-articular extension. There is mild widening of the medial clear space. No other acute fracture or dislocation. No aggressive osseous lesion. Knee joint space is preserved on this nonweightbearing views. No significant osteophytosis. No knee effusion. No radiopaque foreign bodies. IMPRESSION: 1. Mildly displaced trimalleolar fracture with intra-articular extension. 2. Diffuse osteopenia. Electronically Signed   By: Jules Schick M.D.   On: 01/31/2023 15:24    Positive ROS: All other systems have been reviewed and were otherwise negative with the exception of those mentioned in the HPI and as above.  12 point ROS was performed.  Physical Exam: General: Alert and oriented.  No apparent distress.  Vascular:  Left foot:Dorsalis Pedis:  thready Posterior Tibial:  thready  Right foot: Dorsalis  Pedis:  diminished Posterior Tibial:  diminished Doppler evaluation of the left foot revealed biphasic DP and PT pulses.  Capillary fill time was brisk.  Neuro:intact gross sensation.  Derm: Splint removed today.  No fracture blisters.  Bruising to the medial and lateral aspect of the ankle.  Ortho/MS: Patient guarded to the left lower extremity as expected.  Good alignment grossly to the foot and ankle at this time.  No severe displacement.  Mild edema as expected.  I personally reviewed the x-rays that shows a trimalleolar ankle fracture.  Very small posterior malleolus fracture.  Small medial malleolus fracture.  Mild displacement laterally of the fibula.  Assessment: Trimalleolar ankle fracture left ankle Ischemic cardiomyopathy with heart failure. History of COPD with history of chronic tobacco use. History of CRPS  Plan: The patient I had a long discussion in regards to possible treatment options going forward.  I discussed routinely with trimalleolar ankle fractures these are considered unstable fractures and recommendation is typically open reduction with internal fixation.  We discussed the postoperative course.  We discussed the risk and benefits associated with surgery.  Given her multiple comorbidities including ischemic cardiomyopathy and severe COPD as well as her history of CRPS and peripheral vascular disease consideration for conservative treatment with cast immobilization and close monitoring could be considered as well.  At this time the patient is resistant to undergo surgical ORIF at this time electing to undergo conservative therapy for now.  At this point she is in a well position good compressive posterior splint that is nice and stable.  Will recommend nonweightbearing and physical therapy for evaluation for nonweightbearing.  While in hospital podiatry will continue to monitor at a distance.  If she has any considerations for surgery am happy to follow-up and consider  surgery at the end of the week or can be performed even outpatient if any displacement of the fracture occurs in the near future.  Noninvasive vascular testing was performed today to evaluate current circulatory status.    Irean Hong, DPM Cell [phone]   02/01/2023 8:18 AM

## 2023-02-01 NOTE — Consult Note (Signed)
[facility] CLINIC CARDIOLOGY CONSULT NOTE       Patient ID: Wendy Arias MRN: [id number] DOB/AGE: 73-Feb-1952 73 y.o.  Admit date: 01/31/2023 Referring Physician Dr. Rosezetta Schlatter  Primary Physician Debbra Riding, PA-C Primary Cardiologist Dr. Rudi Heap, PA-C Reason for Consultation medical optimization prior to ankle surgery.  HPI: Wendy Arias. [place] is a 71yoF with a PMH of HFpEF (EF 55-60%, mild MR 06/2022), CAD s/p PCI to LCx (2004), PAD s/p bilat common iliac & bilat external illiac stents (09/2021), COPD with ongoing tobacco use, CRPS who presented to [facility] ED 01/31/2023 after a fall, fracturing her left ankle.  Cardiology is consulted for medical optimization prior to possible ankle fracture repair.  Patient presents with her sister who helps contribute to the history.  She was recently seen at [facility] cardiology on 01/20/2023 where she had 2 months of worsening exertional dyspnea, and peripheral edema extending to her thighs, with a 20 pound weight gain.  She was started on torsemide 20 mg daily by her PCP which was eventually increased to 40 mg daily with modest weight loss and mild improvement in peripheral edema.  Her torsemide was stopped altogether for several days but restarted after this cardiology clinic visit, and was also given metolazone 5 mg daily for 3 to 5 days.  Her weight on 7/25 in office was 150 pounds.  Weight in the emergency department was 140 pounds.  Today she presents after a fall, she stood up to go to the restroom, felt lightheaded and dizzy after a couple steps, falling on her buttocks and immediately had left ankle pain.  The patient says she did not lose consciousness or hit her head.  She could not stand up or bear weight on her ankle and presented to the emergency department for further assistance.  She also denies preceding chest discomfort or shortness of breath prior to her fall.  X-ray of her left ankle showed a trimalleolar fracture and has been evaluated by  podiatry for possible surgical versus conservative management.  At my time of evaluation she is sitting upright in bed and denies chest pain, lightheadedness or dizziness.  No shortness of breath, heart racing or palpitations.  She is in sinus rhythm in the 80s to 90s on telemetry,  comfortable on 2 L of oxygen by nasal cannula.  Labs notable for improving hypokalemia from 2.9-4.4 after supplementation.  Slight AKI with BUN/creatinine 56/1.66 and GFR of 33.  BNP within normal limits at 55.3, high-sensitivity troponin x 1 negative at 10.  H&H stable at 13.8/42.9.   Review of systems complete and found to be negative unless listed above     Past Medical History:  Diagnosis Date   (HFpEF) heart failure with preserved ejection fraction (HCC) 06/07/2004   a.) TTE 06/07/2004: EF 50-55%; inf HK. b.) TTE 04/17/2018: EF 45%; GLS -14.6%; inferoseptal and inferoposterior HK; sev LA enlargement; mild PR, mod TR, sev MR; G2DD. c.) TTE 12/05/2018: EF >55%, mild LVH; mild MR. d.) TTE 12/18/2020: EF >55%; mild LA enlargement; triv TR/PR, mod MR; G1DD.   Anxiety    Aortic atherosclerosis (HCC)    Asthma    CAD (coronary artery disease) 06/08/2004   a.) NSTEMI 06/05/2004. b.) LHC 06/08/2004: EF 70%; 50% mLAD, 50% dLAD, 99% OM3, 50% pRCA, 50% mRCA; PCI to OM3 placing a DES x 1 (unknown type). c.) Lexi scan 09/14/2021: normal LV function with no ischemia or scar.   COPD (chronic obstructive pulmonary disease) (HCC)    Depression  Diverticulosis    DOE (dyspnea on exertion)    Empyema of right pleural space (HCC) 02/16/2018   HLD (hyperlipidemia)    Hypertension    IDA (iron deficiency anemia)    Ischemic cardiomyopathy    NSTEMI (non-ST elevated myocardial infarction) (HCC) 06/05/2004   a.)  PCI on 06/08/2004 placing a DES x1 (unknown type) to 99% lesion in OM3.   Osteoporosis    Peripheral vascular disease (HCC)    PONV (postoperative nausea and vomiting)     Past Surgical History:  Procedure  Laterality Date   COLONOSCOPY WITH PROPOFOL N/A 04/12/2017   Procedure: COLONOSCOPY WITH PROPOFOL;  Surgeon: Christena Deem, MD;  Location: [facility] ENDOSCOPY;  Service: Endoscopy;  Laterality: N/A;   CORONARY ANGIOPLASTY WITH STENT PLACEMENT Left 06/08/2004   Procedure: CORONARY ANGIOPLASTY WITH STENT PLACEMENT; Location: [facility]; Surgeon: Rudean Hitt, MD   ENDARTERECTOMY FEMORAL Bilateral 09/16/2021   Procedure: ENDARTERECTOMY FEMORAL;  Surgeon: Annice Needy, MD;  Location: [facility] ORS;  Service: Vascular;  Laterality: Bilateral;   ENDOBRONCHIAL ULTRASOUND N/A 11/28/2017   Procedure: ENDOBRONCHIAL ULTRASOUND;  Surgeon: Shane Crutch, MD;  Location: [facility] ORS;  Service: Pulmonary;  Laterality: N/A;   INSERTION OF ILIAC STENT Bilateral 09/16/2021   Procedure: INSERTION OF ILIAC STENT;  Surgeon: Annice Needy, MD;  Location: [facility] ORS;  Service: Vascular;  Laterality: Bilateral;   LOWER EXTREMITY ANGIOGRAPHY Right 08/12/2021   Procedure: Lower Extremity Angiography;  Surgeon: Annice Needy, MD;  Location: [facility] INVASIVE CV LAB;  Service: Cardiovascular;  Laterality: Right;   PARTIAL HYSTERECTOMY N/A    REDUCTION MAMMAPLASTY     VIDEO ASSISTED THORACOSCOPY (VATS)/THOROCOTOMY Right 03/2018   Procedure: VIDEO ASSISTED THORACOSCOPY (VATS)/THOROCOTOMY (partial decortication, loculated empyema); Location: [facility]    Medications Prior to Admission  Medication Sig Dispense Refill Last Dose   amitriptyline (ELAVIL) 100 MG tablet Take 100 mg by mouth at bedtime.   01/31/2023   aspirin EC 81 MG tablet Take 1 tablet (81 mg total) by mouth daily. 150 tablet 2 01/31/2023   atorvastatin (LIPITOR) 40 MG tablet Take 40 mg by mouth daily.   01/31/2023   clopidogrel (PLAVIX) 75 MG tablet TAKE 1 TABLET BY MOUTH DAILY AT 6AM (Patient taking differently: Take 75 mg by mouth daily.) 30 tablet 6 01/31/2023   gabapentin (NEURONTIN) 300 MG capsule Take 2 capsules (600 mg total) by mouth at bedtime. (Patient taking differently:  Take 600 mg by mouth 2 (two) times daily. One capsule in the morning, 2 capsules at bedtime) 60 capsule 3 01/31/2023   Magnesium 400 MG CAPS Take 1 tablet by mouth daily.   01/31/2023   metolazone (ZAROXOLYN) 5 MG tablet Take 1 tablet (5 mg) once daily 30 minutes before torsemide for 3-5 days.   01/31/2023   pregabalin (LYRICA) 150 MG capsule Take 150 mg by mouth 3 (three) times daily.   01/31/2023   torsemide (DEMADEX) 20 MG tablet Take 20 mg by mouth daily.   01/31/2023   vitamin B-12 (CYANOCOBALAMIN) 1000 MCG tablet Take 1,000 mcg by mouth daily.   01/31/2023   Vitamin E 268 MG (400 UNIT) CAPS Take 400 Units by mouth daily.   01/31/2023   albuterol (PROVENTIL HFA;VENTOLIN HFA) 108 (90 Base) MCG/ACT inhaler Inhale into the lungs every 6 (six) hours as needed for wheezing or shortness of breath.      furosemide (LASIX) 20 MG tablet Take 20 mg by mouth daily. (Patient not taking: Reported on 01/27/2023)   Not Taking   HYDROcodone-acetaminophen (NORCO/VICODIN)  5-325 MG tablet Take 1 tablet by mouth 2 (two) times daily as needed for moderate pain.      hydrOXYzine (ATARAX) 25 MG tablet TAKE ONE (1) TABLET BY MOUTH TWO TIMES PER DAY AS NEEDED FOR ITCHING      ipratropium-albuterol (DUONEB) 0.5-2.5 (3) MG/3ML SOLN Take 3 mLs by nebulization 3 (three) times daily. 270 mL 0    nitroGLYCERIN (NITROSTAT) 0.4 MG SL tablet Place under the tongue.      predniSONE (STERAPRED UNI-PAK 21 TAB) 10 MG (21) TBPK tablet Start 60 mg po daily, taper 10 mg daily until finish (Patient not taking: Reported on 01/27/2023) 21 tablet 0 Not Taking   tiZANidine (ZANAFLEX) 2 MG tablet Take 1-2 tablets (2-4 mg total) by mouth at bedtime as needed for muscle spasms. 60 tablet 1    TRELEGY ELLIPTA 100-62.5-25 MCG/ACT AEPB Inhale 1 puff into the lungs daily. 1 each 3    Social History   Socioeconomic History   Marital status: Divorced    Spouse name: Not on file   Number of children: Not on file   Years of education: Not on file   Highest  education level: Not on file  Occupational History   Not on file  Tobacco Use   Smoking status: Some Days    Current packs/day: 0.50    Average packs/day: 0.5 packs/day for 20.0 years (10.0 ttl pk-yrs)    Types: Cigarettes   Smokeless tobacco: Never  Vaping Use   Vaping status: Never Used  Substance and Sexual Activity   Alcohol use: Never    Alcohol/week: 3.0 standard drinks of alcohol    Types: 1 Glasses of wine, 2 Cans of beer per week    Comment: rarely   Drug use: Never   Sexual activity: Not Currently  Other Topics Concern   Not on file  Social History Narrative   Not on file   Social Determinants of Health   Financial Resource Strain: High Risk (02/16/2018)   Overall Financial Resource Strain (CARDIA)    Difficulty of Paying Living Expenses: Very hard  Food Insecurity: No Food Insecurity (01/31/2023)   Hunger Vital Sign    Worried About Running Out of Food in the Last Year: Never true    Ran Out of Food in the Last Year: Never true  Transportation Needs: No Transportation Needs (01/31/2023)   PRAPARE - Administrator, Civil Service (Medical): No    Lack of Transportation (Non-Medical): No  Physical Activity: Inactive (02/16/2018)   Exercise Vital Sign    Days of Exercise per Week: 0 days    Minutes of Exercise per Session: 0 min  Stress: Stress Concern Present (02/16/2018)   [employer] of Occupational Health - Occupational Stress Questionnaire    Feeling of Stress : Rather much  Social Connections: Unknown (02/16/2018)   Social Connection and Isolation Panel [NHANES]    Frequency of Communication with Friends and Family: More than three times a week    Frequency of Social Gatherings with Friends and Family: More than three times a week    Attends Religious Services: Patient declined    Database administrator or Organizations: Patient declined    Attends Banker Meetings: Patient declined    Marital Status: Divorced  Catering manager  Violence: Not At Risk (01/31/2023)   Humiliation, Afraid, Rape, and Kick questionnaire    Fear of Current or Ex-Partner: No    Emotionally Abused: No    Physically Abused: No  Sexually Abused: No    Family History  Problem Relation Age of Onset   Breast cancer Mother 41   Hypertension Mother    Dementia Mother    Breast cancer Maternal Aunt 67   Breast cancer Other 35   Hypertension Father    Heart disease Father    Diabetes Father    Diabetes Sister       Intake/Output Summary (Last 24 hours) at 02/01/2023 1054 Last data filed at 02/01/2023 0526 Gross per 24 hour  Intake 360 ml  Output 300 ml  Net 60 ml    Vitals:   01/31/23 1817 01/31/23 1856 01/31/23 1923 02/01/23 0820  BP:  123/76  131/74  Pulse:  74  99  Resp:  16  20  Temp: (!) 97.5 F (36.4 C) 97.9 F (36.6 C)  98.3 F (36.8 C)  TempSrc: Oral   Oral  SpO2:  (!) 86% 91%   Weight:      Height:        PHYSICAL EXAM General: Pleasant acute on chronically ill-appearing elderly female, in no acute distress.  Sitting upright in bed with sister at bedside. HEENT:  Normocephalic and atraumatic. Neck:  No JVD.  Lungs: Normal respiratory effort on [place] oxygen, diffuse expiratory wheezing with basilar rhonchi.   Heart: HRRR . Normal S1 and S2 without gallops or murmurs.  Abdomen: Non-distended appearing.  Msk: Normal strength and tone for age. Extremities: Warm and well perfused. No clubbing, cyanosis.  No peripheral edema.  Left ankle wrapped in Ace bandage and elevated. Neuro: Alert and oriented X 3. Psych:  Answers questions appropriately.   Labs: Basic Metabolic Panel: Recent Labs    01/31/23 1405 01/31/23 2002 02/01/23 0410  NA 138  --  135  K 2.9* 2.8* 4.4  CL 85*  --  92*  CO2 36*  --  30  GLUCOSE 91  --  185*  BUN 54*  --  56*  CREATININE 1.54*  --  1.66*  CALCIUM 10.2  --  9.1  MG 2.2  --  2.4   Liver Function Tests: No results for input(s): "AST", "ALT", "ALKPHOS", "BILITOT", "PROT", "ALBUMIN"  in the last 72 hours. No results for input(s): "LIPASE", "AMYLASE" in the last 72 hours. CBC: Recent Labs    01/31/23 1405 02/01/23 0410  WBC 9.1 8.2  NEUTROABS  --  7.4  HGB 14.6 13.8  HCT 44.6 42.9  MCV 86.3 88.5  PLT 203 216   Cardiac Enzymes: Recent Labs    01/31/23 1405  TROPONINIHS 10   BNP: Recent Labs    01/31/23 1405  BNP 55.3   D-Dimer: No results for input(s): "DDIMER" in the last 72 hours. Hemoglobin A1C: No results for input(s): "HGBA1C" in the last 72 hours. Fasting Lipid Panel: No results for input(s): "CHOL", "HDL", "LDLCALC", "TRIG", "CHOLHDL", "LDLDIRECT" in the last 72 hours. Thyroid Function Tests: No results for input(s): "TSH", "T4TOTAL", "T3FREE", "THYROIDAB" in the last 72 hours.  Invalid input(s): "FREET3" Anemia Panel: No results for input(s): "VITAMINB12", "FOLATE", "FERRITIN", "TIBC", "IRON", "RETICCTPCT" in the last 72 hours.   Radiology: DG Chest Portable 1 View  Result Date: 01/31/2023 CLINICAL DATA:  Syncope EXAM: PORTABLE CHEST 1 VIEW COMPARISON:  Chest radiograph dated 07/13/2022 FINDINGS: Normal lung volumes. No focal consolidations. No pleural effusion or pneumothorax. The heart size and mediastinal contours are within normal limits. No radiographic finding of acute displaced fracture. IMPRESSION: 1. No radiographic finding of acute displaced fracture. 2.  No focal  consolidations. Electronically Signed   By: Agustin Cree M.D.   On: 01/31/2023 16:20   DG Knee Complete 4 Views Left  Result Date: 01/31/2023 CLINICAL DATA:  fall EXAM: LEFT KNEE - COMPLETE 4+ VIEW; LEFT ANKLE COMPLETE - 3+ VIEW COMPARISON:  None Available. FINDINGS: There is diffuse osteopenia of the visualized osseous structures. Mildly displaced fractures of the lateral malleolus, medial malleolus and posterior malleolus noted with intra-articular extension. There is mild widening of the medial clear space. No other acute fracture or dislocation. No aggressive osseous lesion.  Knee joint space is preserved on this nonweightbearing views. No significant osteophytosis. No knee effusion. No radiopaque foreign bodies. IMPRESSION: 1. Mildly displaced trimalleolar fracture with intra-articular extension. 2. Diffuse osteopenia. Electronically Signed   By: Jules Schick M.D.   On: 01/31/2023 15:24   DG Ankle Complete Left  Result Date: 01/31/2023 CLINICAL DATA:  fall EXAM: LEFT KNEE - COMPLETE 4+ VIEW; LEFT ANKLE COMPLETE - 3+ VIEW COMPARISON:  None Available. FINDINGS: There is diffuse osteopenia of the visualized osseous structures. Mildly displaced fractures of the lateral malleolus, medial malleolus and posterior malleolus noted with intra-articular extension. There is mild widening of the medial clear space. No other acute fracture or dislocation. No aggressive osseous lesion. Knee joint space is preserved on this nonweightbearing views. No significant osteophytosis. No knee effusion. No radiopaque foreign bodies. IMPRESSION: 1. Mildly displaced trimalleolar fracture with intra-articular extension. 2. Diffuse osteopenia. Electronically Signed   By: Jules Schick M.D.   On: 01/31/2023 15:24    ECHO 07/13/2022  1. Left ventricular ejection fraction, by estimation, is 55 to 60%. The  left ventricle has normal function. The left ventricle has no regional  wall motion abnormalities. Left ventricular diastolic parameters are  indeterminate.   2. Right ventricular systolic function is normal. The right ventricular  size is normal. Tricuspid regurgitation signal is inadequate for assessing  PA pressure.   3. The mitral valve is degenerative. Mild mitral valve regurgitation. No  evidence of mitral stenosis.   4. The aortic valve was not well visualized. Aortic valve regurgitation  is not visualized. No aortic stenosis is present.   TELEMETRY reviewed by me (LT) 02/01/2023 : Sinus rhythm rate 80s to 90s  EKG reviewed by me: NSR 80 artifact limiting interpretation   Data reviewed  by me (LT) 02/01/2023: Last outpatient cardiology note, ED note, podiatry note, hospitalist progress note last 24h vitals tele labs imaging I/O   Principal Problem:   Closed left ankle fracture    ASSESSMENT AND PLAN:  Claris Gladden L. Forton is a 71yoF with a PMH of HFpEF (EF 55-60%, mild MR 06/2022), CAD s/p PCI to LCx (2004), PAD s/p bilat common iliac & bilat external illiac stents (09/2021), COPD with ongoing tobacco use, CRPS who presented [facility] ED 01/31/2023 after a fall, fracturing her left ankle.  Cardiology is consulted for medical optimization prior to possible ankle fracture repair.  # Left trimalleolar fracture  # Medical optimization prior to ankle surgery # COPD exacerbation # Chronic HFpEF # CAD with remote history of PCI to Lcx Presents after a fall preceded by lightheadedness and dizziness and after changing position from sitting to standing without true loss of consciousness, unfortunately suffering a left ankle fracture.  The patient was recently treated with outpatient diuretics for acute on chronic HFpEF with improvement in her peripheral edema and decrease in her weight by 10 pounds.  She appears clinically dry on exam today, predominantly wheezing on exam the BNP is  55, chest x-ray is clear.  No evidence of arrhythmia on telemetry as an explanation for the patient's fall. -Agree with current therapy per primary team and podiatry -Agree to hold torsemide for now, defer further metolazone.  Likely restart torsemide 20 mg daily at discharge.  -Continue telemetry monitoring while inpatient -Monitor and replenish electrolytes for a goal K >4, mag >2 -Defer BB with baseline normotension -Continue aspirin 81 mg daily, pre-, peri-, and postoperatively -Echo complete -The patient is at low to moderate risk from a cardiac perspective to proceed with left ankle repair with podiatry without further modifiable risk factors or additional cardiac diagnostics recommended prior to  proceeding.  Cardiology will sign off. Please haiku with questions or re-engage if needed.    This patient's plan of care was discussed and created with Dr. Melton Alar  and he is in agreement.  Signed: Rebeca Allegra , PA-C 02/01/2023, 10:54 AM [facility]

## 2023-02-01 NOTE — Progress Notes (Addendum)
Progress Note   Patient: Wendy Arias UEA:[id number] DOB: Jan 09, 1951 DOA: 01/31/2023     1 DOS: the patient was seen and examined on 02/01/2023    Subjective:  Patient seen and examined at bedside this morning in the presence of her sister. Patient has already been seen by podiatrist this morning and after long discussion patient has opted for conservative management. PT OT have been requested with recommendation for skilled nursing facility placement however patient wants to go home when medically stable. Patient also found to have wheezing this morning with associated decreasing oxygen levels. Denies chest pain nausea vomiting abdominal pain. Chest x-ray report reviewed by me did not show any evidence of vascular congestion or infiltrates    Brief hospital course: Wendy Arias is a 72 y.o. female with medical history significant of COPD not on home oxygen, depression, complex regional pain syndrome, heart failure with preserved ejection fraction, coronary artery disease status post stent, hyperlipidemia, hypertension, peripheral vascular disease who was otherwise well until this afternoon when she was sitting down with family and decided to get up to the restroom and not long afterwards experienced a near syncopal episode falling down and landed on her buttocks.  According to patient she did not lose consciousness and did not hit in the head.  She immediately started feeling left ankle pain with intensity 10/10 with difficulty walking and therefore brought to the emergency room for further management.  Upon arrival patient underwent x-ray of the left ankle that showed a trimalleolar fracture.  Podiatry was therefore contacted who agreed for patient to be admitted for further management.  In the emergency room lab reviewed showed findings of AKI.  Patient also appeared clinically dehydrated.    Assessment and Plan:   Acute displaced trimalleolar fracture of the left ankle with intra-articular  extension Podiatry consulted we appreciate input Have discussed the plan of care with podiatry After long discussion patient has opted for conservative management. PT OT have been requested with recommendation for skilled nursing facility placement however patient wants to go home when medically stable. Continue pain management with Dilaudid as needed I personally reviewed the patient's imaging of the left ankle showing displaced trimalleolar fracture   Acute kidney injury secondary to dehydration in the setting of diuretic use and poor oral intake Patient creatinine at baseline is within normal limit however presented with creatinine 1.54 Continue IV fluid hydration Monitor renal function closely Consider nephrologist consultation if renal function does not improve Avoid nephrotoxic agents Renally dose all drugs     Near syncope likely secondary to dehydration with possible underlying orthostatic hypotension Monitor orthostatic vitals closely Continue IV fluid for dehydration   Severe hypokalemia-improving Presented with potassium 2.2 likely secondary to diuretic effect Patient received IV potassium as well as oral potassium repletion in the ER Continue to monitor potassium closely   Acute hypoxic respiratory failure secondary to COPD exacebation  Currently in acute exacerbation Continue nebulization Will complete 5 days course of oral prednisone 40 mg daily monitor saturation closely Patient may need home oxygen evaluation before discharge   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 Continue amitriptyline   Complex regional pain syndrome,  Patient follows up with an outpatient pain physician Takes Norco, gabapentin as well as Lyrica which have been resumed     Chronic congestive heart failure with preserved ejection fraction,  Last echo was in January 2024 that showed ejection fraction 55 to 60% with diastolic function indeterminate Continue to monitor fluid  status closely  Monitor daily weight Continue holding diuretics at this time on account of dehydration   Coronary artery disease status post stent,  Continue aspirin ,Plavix and statin therapy   Hyperlipidemia,  Continue current statin therapy   Hypertension,  Continue holding antihypertensives at this time on account of relative hypotension   Peripheral vascular disease  Continue aspirin and statin therapy      Advance Care Planning:   Code Status: Full code   Consults: Podiatry   Family Communication: Discussed with sister present at bedside    Physical Exam: General: In some distress on account of the left ankle pain Respiratory: Bilateral wheezing noted on intranasal oxygen Cardio vascular: Heart sounds 1 and 2 present no murmur appreciated Abdomen: Full nondistended nontender CNS: Alert and oriented x 3 nonfocal Musculoskeletal: Tenderness to the left ankle dressing in place clean and dry   Vitals:   01/31/23 1817 01/31/23 1856 01/31/23 1923 02/01/23 0820  BP:  123/76  131/74  Pulse:  74  99  Resp:  16  20  Temp: (!) 97.5 F (36.4 C) 97.9 F (36.6 C)  98.3 F (36.8 C)  TempSrc: Oral   Oral  SpO2:  (!) 86% 91%   Weight:      Height:        Data Reviewed: I have reviewed the patient's chest x-ray report with findings as noted above, I have reviewed patient's potassium level CBC as well as BMP including renal function, nursing documentation, podiatry documentation as well as vitals   Disposition: Status is: Inpatient Remains inpatient appropriate because: Patient still needing treatment for COPD exacerbation with new oxygen requirement  Time spent: 55 minutes  Author: Loyce Dys, MD 02/01/2023 12:26 PM  For on call review [email or web address].

## 2023-02-02 ENCOUNTER — Inpatient Hospital Stay
Admit: 2023-02-02 | Discharge: 2023-02-02 | Disposition: A | Discharge status: Home / Self Care | Payer: HMO | Attending: Cardiology | Admitting: Cardiology

## 2023-02-02 DIAGNOSIS — S82892A Other fracture of left lower leg, initial encounter for closed fracture: Secondary | ICD-10-CM | POA: Diagnosis not present

## 2023-02-02 LAB — GLUCOSE, CAPILLARY: Glucose-Capillary: 114 mg/dL — ABNORMAL HIGH (ref 70–99)

## 2023-02-02 MED ORDER — OXYCODONE HCL 5 MG PO TABS: 5.0000 mg | ORAL_TABLET | Freq: Four times a day (QID) | ORAL | Status: DC | PRN

## 2023-02-02 MED ORDER — HYDROMORPHONE HCL 1 MG/ML IJ SOLN
0.5000 mg | INTRAMUSCULAR | Status: DC | PRN
  Administered 2023-02-03: 1 mg via INTRAVENOUS
  Filled 2023-02-02: qty 1

## 2023-02-02 MED ORDER — ACETAMINOPHEN 500 MG PO TABS
1000.0000 mg | ORAL_TABLET | Freq: Three times a day (TID) | ORAL | Status: DC
  Administered 2023-02-02 – 2023-02-04 (×7): 1000 mg via ORAL
  Filled 2023-02-02 (×7): qty 2

## 2023-02-02 MED ORDER — POTASSIUM CHLORIDE 20 MEQ PO PACK
40.0000 meq | PACK | Freq: Once | ORAL | Status: AC
  Administered 2023-02-02: 40 meq via ORAL
  Filled 2023-02-02: qty 2

## 2023-02-02 MED ORDER — OXYCODONE HCL 5 MG PO TABS
5.0000 mg | ORAL_TABLET | Freq: Four times a day (QID) | ORAL | Status: DC | PRN
  Administered 2023-02-02: 5 mg via ORAL
  Administered 2023-02-02 – 2023-02-04 (×6): 10 mg via ORAL
  Filled 2023-02-02 (×7): qty 2
  Filled 2023-02-02: qty 1

## 2023-02-02 MED ORDER — TRAMADOL HCL 50 MG PO TABS
50.0000 mg | ORAL_TABLET | Freq: Four times a day (QID) | ORAL | Status: DC | PRN
  Administered 2023-02-02: 50 mg via ORAL
  Filled 2023-02-02: qty 1

## 2023-02-02 NOTE — Progress Notes (Signed)
*  PRELIMINARY RESULTS* Echocardiogram 2D Echocardiogram has been performed.  Carolyne Fiscal 02/02/2023, 3:24 PM

## 2023-02-02 NOTE — Plan of Care (Signed)

## 2023-02-02 NOTE — Plan of Care (Signed)
  Problem: Education: Goal: Knowledge of General Education information will improve Description: Including pain rating scale, medication(s)/side effects and non-pharmacologic comfort measures 02/02/2023 0150 by Anselm Pancoast, RN Outcome: Progressing 02/02/2023 0148 by Anselm Pancoast, RN Outcome: Progressing   Problem: Health Behavior/Discharge Planning: Goal: Ability to manage health-related needs will improve 02/02/2023 0150 by Anselm Pancoast, RN Outcome: Progressing 02/02/2023 0148 by Anselm Pancoast, RN Outcome: Progressing   Problem: Clinical Measurements: Goal: Ability to maintain clinical measurements within normal limits will improve 02/02/2023 0150 by Anselm Pancoast, RN Outcome: Progressing 02/02/2023 0148 by Anselm Pancoast, RN Outcome: Progressing Goal: Will remain free from infection 02/02/2023 0150 by Anselm Pancoast, RN Outcome: Progressing 02/02/2023 0148 by Anselm Pancoast, RN Outcome: Progressing Goal: Diagnostic test results will improve 02/02/2023 0150 by Anselm Pancoast, RN Outcome: Progressing 02/02/2023 0148 by Anselm Pancoast, RN Outcome: Progressing Goal: Respiratory complications will improve 02/02/2023 0150 by Anselm Pancoast, RN Outcome: Progressing 02/02/2023 0148 by Anselm Pancoast, RN Outcome: Progressing Goal: Cardiovascular complication will be avoided 02/02/2023 0150 by Anselm Pancoast, RN Outcome: Progressing 02/02/2023 0148 by Anselm Pancoast, RN Outcome: Progressing   Problem: Activity: Goal: Risk for activity intolerance will decrease 02/02/2023 0150 by Anselm Pancoast, RN Outcome: Progressing 02/02/2023 0148 by Anselm Pancoast, RN Outcome: Progressing   Problem: Nutrition: Goal: Adequate nutrition will be maintained 02/02/2023 0150 by Anselm Pancoast, RN Outcome: Progressing 02/02/2023 0148 by Anselm Pancoast, RN Outcome: Progressing   Problem: Coping: Goal: Level of anxiety will decrease 02/02/2023 0150 by Anselm Pancoast, RN Outcome: Progressing 02/02/2023 0148 by Anselm Pancoast, RN Outcome: Progressing   Problem: Elimination: Goal: Will not experience complications related to bowel motility 02/02/2023 0150 by Anselm Pancoast, RN Outcome: Progressing 02/02/2023 0148 by Anselm Pancoast, RN Outcome: Progressing Goal: Will not experience complications related to urinary retention Outcome: Progressing   Problem: Pain Managment: Goal: General experience of comfort will improve 02/02/2023 0150 by Anselm Pancoast, RN Outcome: Progressing 02/02/2023 0148 by Anselm Pancoast, RN Outcome: Progressing   Problem: Safety: Goal: Ability to remain free from injury will improve 02/02/2023 0150 by Anselm Pancoast, RN Outcome: Progressing 02/02/2023 0148 by Anselm Pancoast, RN Outcome: Progressing   Problem: Skin Integrity: Goal: Risk for impaired skin integrity will decrease 02/02/2023 0150 by Anselm Pancoast, RN Outcome: Progressing 02/02/2023 0148 by Anselm Pancoast, RN Outcome: Progressing

## 2023-02-02 NOTE — Progress Notes (Signed)
Occupational Therapy Treatment Patient Details Name: Wendy Arias MRN: [id number] DOB: 03-Nov-1950 Today's Date: 02/02/2023   History of present illness Pt admitted for L LE ankle fx. Pt elected conservative management at this time. History includes COPD, cardiomyopathy and CRPS.   OT comments  Upon entering the room, pt supine in bed and agreeable to co-treatment with PT. Pt donning R sock while in bed with use of figure four position. Supine - sit with min guard and cuing for technique. Pt performed lateral scoot towards the R into recliner chair. OT spoke to pt and sister in the room about wheelchair and drop arm commode chair transfer being same technique as utilized during session. Pt maintains NWB throughout session. She does appear to be more anxious as well but encouraged throughout. Pt seated in recliner chair with call bell and all needed items within reach upon exiting the room.       If plan is discharge home, recommend the following:  A lot of help with walking and/or transfers;A lot of help with bathing/dressing/bathroom;Assistance with cooking/housework;Assist for transportation;Help with stairs or ramp for entrance   Equipment Recommendations  BSC/3in1;Other (comment) (drop arm commode chair)       Precautions / Restrictions Precautions Precautions: Fall Restrictions Weight Bearing Restrictions: Yes LLE Weight Bearing: Non weight bearing       Mobility Bed Mobility Overal bed mobility: Needs Assistance Bed Mobility: Supine to Sit     Supine to sit: Min assist          Transfers Overall transfer level: Needs assistance   Transfers: Bed to chair/wheelchair/BSC            Lateral/Scoot Transfers: Contact guard assist, +2 safety/equipment       Balance Overall balance assessment: Needs assistance Sitting-balance support: Feet supported, Bilateral upper extremity supported Sitting balance-Leahy Scale: Good                                      ADL either performed or assessed with clinical judgement   ADL Overall ADL's : Needs assistance/impaired                                       General ADL Comments: Pt able to don R sock while in bed with figure four position and set up A to obtain.    Extremity/Trunk Assessment Upper Extremity Assessment Upper Extremity Assessment: Generalized weakness   Lower Extremity Assessment Lower Extremity Assessment: Generalized weakness   Cervical / Trunk Assessment Cervical / Trunk Assessment: Normal    Vision Baseline Vision/History: 1 Wears glasses Patient Visual Report: No change from baseline            Cognition Arousal: Alert Behavior During Therapy: Agitated Overall Cognitive Status: Impaired/Different from baseline                                 General Comments: slighty confused this date with agitation noted. Difficulty with problem solving                   Pertinent Vitals/ Pain       Pain Assessment Pain Assessment: Faces Faces Pain Scale: Hurts even more Pain Location: L ankle Pain Descriptors / Indicators: Aching, Discomfort, Grimacing, Guarding Pain Intervention(s):  Limited activity within patient's tolerance, Premedicated before session, Repositioned         Frequency  Min 1X/week        Progress Toward Goals  OT Goals(current goals can now be found in the care plan section)  Progress towards OT goals: Progressing toward goals     Plan      Co-evaluation      Reason for Co-Treatment: Complexity of the patient's impairments (multi-system involvement);For patient/therapist safety;To address functional/ADL transfers PT goals addressed during session: Mobility/safety with mobility OT goals addressed during session: Proper use of Adaptive equipment and DME      AM-PAC OT "6 Clicks" Daily Activity     Outcome Measure   Help from another person eating meals?: None Help from another person taking  care of personal grooming?: None Help from another person toileting, which includes using toliet, bedpan, or urinal?: A Lot Help from another person bathing (including washing, rinsing, drying)?: A Lot Help from another person to put on and taking off regular upper body clothing?: A Little Help from another person to put on and taking off regular lower body clothing?: A Lot 6 Click Score: 17    End of Session Equipment Utilized During Treatment: Oxygen (3Ls)  OT Visit Diagnosis: Unsteadiness on feet (R26.81);Muscle weakness (generalized) (M62.81);History of falling (Z91.81)   Activity Tolerance Patient tolerated treatment well   Patient Left in chair;with chair alarm set;with call bell/phone within reach   Nurse Communication Mobility status        Time: 4782-9562 OT Time Calculation (min): 21 min  Charges: OT General Charges $OT Visit: 1 Visit OT Treatments $Self Care/Home Management : 8-22 mins  Jackquline Denmark, MS, OTR/L , CBIS ascom [phone]  02/02/23, 1:13 PM

## 2023-02-02 NOTE — Consult Note (Signed)
PHARMACY CONSULT NOTE - ELECTROLYTES  Pharmacy Consult for Electrolyte Monitoring and Replacement   Recent Labs: Height: 5' (152.4 cm) Weight: 63.5 kg (140 lb) IBW/kg (Calculated) : 45.5 Estimated Creatinine Clearance: 39.5 mL/min (A) (by C-G formula based on SCr of 1.07 mg/dL (H)). Potassium (mmol/L)  Date Value  02/02/2023 3.5  10/31/2013 4.5   Magnesium (mg/dL)  Date Value  13/24/4010 2.4   Calcium (mg/dL)  Date Value  27/25/3664 8.8 (L)   Calcium, Total (mg/dL)  Date Value  40/34/7425 9.1   Albumin (g/dL)  Date Value  95/63/8756 3.7  10/31/2013 4.0   Sodium (mmol/L)  Date Value  02/02/2023 141  10/31/2013 138    Assessment  Wendy Arias is a 72 y.o. female presenting with an episode of syncope and history of falls. Admitted for acute displacement of trimalleolar fracture of L ankle, AKI, hx syncope episodes, hypoK (8/5). PMH significant for COPD, HFpEF, CAD, HLD, HTN, PVD. Patient now in acute COPD exacerbation (8/7). AKI on admission due to dehydration on admission - continuing to resolve; SCr 1.54 >> 1.07. Pharmacy has been consulted to monitor and replace electrolytes.  Diet: Regular MIVF: Completed Medications Home torsemide not restarted Patient no longer taking furosemide  Defer metolazone per cardiology  Goal of Therapy: Electrolytes WNL K > 4 per cardiology Mg > 2 per cardiology  Plan:  Replaced K - 40 mEq once (8/6)  Continue to check BMP every AM  Thank you for allowing pharmacy to be a part of this patient's care.  Effie Shy, PharmD PGY1 Pharmacy Resident 02/02/2023 11:07 AM

## 2023-02-02 NOTE — Plan of Care (Signed)

## 2023-02-02 NOTE — Progress Notes (Signed)
Daily Progress Note   Subjective  - * No surgery found *  Follow-up left ankle fracture.  Initially patient had deferred surgery electing to undergo conservative therapy but she had questions in regards to possible proceeding with surgery at this time.  Patient seen with daughter at bedside.  Objective Vitals:   02/02/23 0008 02/02/23 0733 02/02/23 0809 02/02/23 1524  BP: 126/84  (!) 149/78 124/73  Pulse: 82  90 87  Resp: 17  20 17   Temp: 98 F (36.7 C)  98 F (36.7 C) 97.9 F (36.6 C)  TempSrc:      SpO2: (!) 88% 90% (!) 89% 94%  Weight:      Height:        Physical Exam: Splint intact.  Good cap fill time to the digits.  She has been complaining of paiN but stable currently.  Vascular ultrasound: IMPRESSION: 1. Monophasic waveforms throughout left lower extremity. 2. Elevated peak systolic velocities in the mid SFA, suggesting at least mild stenosis. 3. Nonvisualization of distal posterior tibial artery.  Laboratory CBC    Component Value Date/Time   WBC 11.1 (H) 02/02/2023 0445   HGB 12.4 02/02/2023 0445   HGB 13.6 10/31/2013 1541   HCT 38.2 02/02/2023 0445   HCT 41.4 10/31/2013 1541   PLT 182 02/02/2023 0445   PLT 272 10/31/2013 1541    BMET    Component Value Date/Time   NA 141 02/02/2023 0445   NA 138 10/31/2013 1541   K 3.5 02/02/2023 0445   K 4.5 10/31/2013 1541   CL 98 02/02/2023 0445   CL 101 10/31/2013 1541   CO2 34 (H) 02/02/2023 0445   CO2 29 10/31/2013 1541   GLUCOSE 154 (H) 02/02/2023 0445   GLUCOSE 96 10/31/2013 1541   BUN 38 (H) 02/02/2023 0445   BUN 10 10/31/2013 1541   CREATININE 1.07 (H) 02/02/2023 0445   CREATININE 0.91 10/31/2013 1541   CALCIUM 8.8 (L) 02/02/2023 0445   CALCIUM 9.1 10/31/2013 1541   GFRNONAA 55 (L) 02/02/2023 0445   GFRNONAA >60 10/31/2013 1541   GFRAA >60 04/13/2018 1030   GFRAA >60 10/31/2013 1541    Assessment/Planning: Trimalleolar ankle fracture left ankle History of PVD COPD History of complex  regional pain syndrome  Patient with trimalleolar ankle fracture.  It is stable at this time.  Adequately reduced.  The fibula is mildly and laterally displaced position. Patient would like to reconsider surgical intervention.  I discussed both the risks and benefits associated with surgery.  Specifically we discussed the risks with CRPS diagnosis.  Patient has been cleared by cardiology.  She does have an echo pending at this time.   I am going to consult vascular surgery.  Patient has a history of angioplasty as well as endarterectomy and stenting in 2023.  Vascular ultrasound has been ordered and resulted. Will follow-up tomorrow.  If able to proceed with surgery will plan for Friday afternoon.  Gwyneth Revels A  02/02/2023, 4:43 PM

## 2023-02-02 NOTE — Progress Notes (Signed)
Pt refused cpap therapy tonight and remainder of admission. Pt states she is , " absolutely not able to tolerate it and wont wear". Device remains standby and ready from use from attempt last night with initial setup. Pt states she has not been diagnosed with OSA and doesn't have home CPAP machine. RN notified of refusal at this time. Pt on 3 lpm [place] w/o distress at this time, sp02 96%.

## 2023-02-02 NOTE — Progress Notes (Addendum)
Progress Note   Patient: Wendy Arias NGE:[id number] DOB: 09-Feb-1951 DOA: 01/31/2023     2 DOS: the patient was seen and examined on 02/02/2023    Subjective:  Patient seen and examined at bedside this morning in the presence of her sister. Patient has already been seen by podiatrist this morning and after long discussion patient has opted for conservative management. PT OT have been requested with recommendation for skilled nursing facility placement however patient wants to go home when medically stable. Patient also found to have wheezing this morning with associated decreasing oxygen levels. Denies chest pain nausea vomiting abdominal pain. Chest x-ray report reviewed by me did not show any evidence of vascular congestion or infiltrates    Brief hospital course: Wendy Arias is a 72 y.o. female with medical history significant of COPD not on home oxygen, depression, complex regional pain syndrome, heart failure with preserved ejection fraction, coronary artery disease status post stent, hyperlipidemia, hypertension, peripheral vascular disease who was otherwise well until this afternoon when she was sitting down with family and decided to get up to the restroom and not long afterwards experienced a near syncopal episode falling down and landed on her buttocks.  According to patient she did not lose consciousness and did not hit in the head.  She immediately started feeling left ankle pain with intensity 10/10 with difficulty walking and therefore brought to the emergency room for further management.  Upon arrival patient underwent x-ray of the left ankle that showed a trimalleolar fracture.  Podiatry was therefore contacted who agreed for patient to be admitted for further management.  In the emergency room lab reviewed showed findings of AKI.  Patient also appeared clinically dehydrated.    Assessment and Plan:   Acute displaced trimalleolar fracture of the left ankle with intra-articular  extension Podiatry consulted we appreciate input Have discussed the plan of care with podiatry 8/7 -- pt was declining surgery, but reconsidering.  Podiatry informed and will see pt this afternoon to discuss details and answer pt and sister's questions. Continue PT OT  Recommendation is for SNF/rehab, patient wants to go home Pt's home pain regimen continued.  Pain uncontrolled. Will schedule Tylenol, and use PRN oxycodone 5-10 mg PRN IV Dilaudid for breakthrough pain only Monitor respiratory status and mentation closely with use of opiates  Non-weight bearing on left leg   Acute kidney injury  - resolved 8/7 secondary to dehydration in the setting of diuretic use and poor oral intake Patient creatinine at baseline is within normal limit however presented with creatinine 1.54 Stop IV fluids Monitor BMP daily Avoid nephrotoxic agents Renally dose all drugs     Near syncope likely secondary to dehydration with possible underlying orthostatic hypotension Monitor orthostatic vitals closely Stop IV fluids   Severe hypokalemia- resolved with replacement Presented with potassium 2.2 likely secondary to diuretic effect. 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 Continue amitriptyline   Complex regional pain syndrome,  Acute on chronic pain due to ankle fracture Patient follows up with an outpatient pain physician Takes Norco, gabapentin as well as Lyrica -- resumed Adjust regimen for adequate pain control   Chronic congestive heart failure with preserved ejection  fraction,  Last echo was in January 2024 that showed ejection fraction 55 to 60% with diastolic  function indeterminate Continue to monitor fluid status closely Monitor daily weight Continue holding diuretics -- AKI has resolved with IV hydration Would resume diuretics cautiously given severe volume depletion and near-syncope on admission   Coronary artery disease status post stent,  Continue aspirin ,Plavix and statin    Hyperlipidemia,  Continue statin    Hypertension,  Continue holding antihypertensives due to relative hypotension   Peripheral vascular disease  Continue aspirin and statin therapy      Advance Care Planning:   Code Status: Full code     Consults: Podiatry    Family Communication: sister present at bedside    Physical Exam: General exam: awake, alert, no some distress related to pain HEENT: moist mucus membranes, hearing grossly normal  Respiratory system: diminished breath sounds, not actively wheezing, normal respiratory effort. Cardiovascular system: normal S1/S2, RRR, no pedal edema.   Gastrointestinal system: soft, NT, ND Central nervous system: A&O x3. no gross focal neurologic deficits, normal speech Extremities: left ankle in clean dry intact dressing, no edema, normal tone Skin: dry, intact, normal temperature Psychiatry: normal mood, congruent affect, judgement and insight appear normal   Vitals:   02/01/23 2016 02/02/23 0008 02/02/23 0733 02/02/23 0809  BP:  126/84  (!) 149/78  Pulse:  82  90  Resp:  17  20  Temp:  98 F (36.7 C)  98 F (36.7 C)  TempSrc:      SpO2: 96% (!) 88% 90% (!) 89%  Weight:      Height:        Data Reviewed:  Notable labs --- bicarb 34, glucose 154, BUN 38, Cr improved 1.07, ca 8.8, wbc 11.1 (steroid-induced)   Disposition: Status is: Inpatient Remains inpatient appropriate because: Patient still needing treatment for COPD exacerbation with new oxygen requirement.  Pt may elect to undergo surgery  Time spent: 45 minutes  Author: Pennie Banter, DO 02/02/2023 12:55 PM  For on call review  [email or web address].

## 2023-02-02 NOTE — Progress Notes (Addendum)
Physical Therapy Treatment Patient Details Name: Wendy Arias MRN: [id number] DOB: 12/29/1950 Today's Date: 02/02/2023   History of Present Illness Pt admitted for L LE ankle fx. Pt elected conservative management at this time. History includes COPD, cardiomyopathy and CRPS.    PT Comments  Pt is making gradual progress towards goals with ability to perform transfer to chair to simulate home transfers. Pt able to maintain correct WBing status and remains limited by pain. Pain meds given prior to session. +2 for safety. Pt with improved strength this session, however cognition impaired. O2 sats ranging from 85-88% with exertion on 3L of O2. Communicated with RN staff for safe mobilization. Will continue to progress.   If plan is discharge home, recommend the following: A little help with walking and/or transfers;A lot of help with bathing/dressing/bathroom;Assist for transportation;Help with stairs or ramp for entrance;Supervision due to cognitive status   Can travel by private vehicle     No  Equipment Recommendations  Wheelchair (measurements PT);BSC/3in1 (with drop arm and leg extender for WC)    Recommendations for Other Services       Precautions / Restrictions Precautions Precautions: Fall Restrictions Weight Bearing Restrictions: Yes LLE Weight Bearing: Non weight bearing     Mobility  Bed Mobility Overal bed mobility: Needs Assistance Bed Mobility: Supine to Sit     Supine to sit: Min assist     General bed mobility comments: cues for sequencing with step by step commands. Assist for scooting out towards EOB.    Transfers Overall transfer level: Needs assistance   Transfers: Bed to chair/wheelchair/BSC            Lateral/Scoot Transfers: Contact guard assist, +2 safety/equipment General transfer comment: heavy education given prior to transfer. Pt able to bridge while seated at EOB and then able to lateral scoot over to recliner. Heavy cues given for safety.  Reports pain with all movement    Ambulation/Gait               General Gait Details: unable to perform   Stairs             Wheelchair Mobility     Tilt Bed    Modified Rankin (Stroke Patients Only)       Balance Overall balance assessment: Needs assistance Sitting-balance support: Feet supported, Bilateral upper extremity supported Sitting balance-Leahy Scale: Good                                      Cognition Arousal: Alert Behavior During Therapy: Agitated Overall Cognitive Status: Impaired/Different from baseline                                 General Comments: slighty confused this date with agitation noted. Difficulty with problem solving        Exercises Other Exercises Other Exercises: able to SLR x 8 reps with supervision. Educated to continue throughout stay    General Comments        Pertinent Vitals/Pain Pain Assessment Pain Assessment: Faces Faces Pain Scale: Hurts even more Pain Location: L ankle Pain Descriptors / Indicators: Aching, Discomfort, Grimacing, Guarding Pain Intervention(s): Limited activity within patient's tolerance, Premedicated before session, Repositioned, Relaxation, Monitored during session    Home Living  Prior Function            PT Goals (current goals can now be found in the care plan section) Acute Rehab PT Goals Patient Stated Goal: to go home PT Goal Formulation: With patient Time For Goal Achievement: 02/15/23 Potential to Achieve Goals: Good Progress towards PT goals: Progressing toward goals    Frequency    Min 1X/week      PT Plan      Co-evaluation PT/OT/SLP Co-Evaluation/Treatment: Yes Reason for Co-Treatment: Complexity of the patient's impairments (multi-system involvement);For patient/therapist safety;To address functional/ADL transfers PT goals addressed during session: Mobility/safety with mobility OT goals  addressed during session: Proper use of Adaptive equipment and DME      AM-PAC PT "6 Clicks" Mobility   Outcome Measure  Help needed turning from your back to your side while in a flat bed without using bedrails?: A Little Help needed moving from lying on your back to sitting on the side of a flat bed without using bedrails?: A Little Help needed moving to and from a bed to a chair (including a wheelchair)?: A Little Help needed standing up from a chair using your arms (e.g., wheelchair or bedside chair)?: Total Help needed to walk in hospital room?: Total Help needed climbing 3-5 steps with a railing? : Total 6 Click Score: 12    End of Session Equipment Utilized During Treatment: Oxygen Activity Tolerance: Patient limited by pain Patient left: in chair;with chair alarm set Nurse Communication: Mobility status PT Visit Diagnosis: Unsteadiness on feet (R26.81);Muscle weakness (generalized) (M62.81);History of falling (Z91.81);Difficulty in walking, not elsewhere classified (R26.2);Pain Pain - Right/Left: Left Pain - part of body: Ankle and joints of foot     Time: 1022-1046 PT Time Calculation (min) (ACUTE ONLY): 24 min  Charges:    $Therapeutic Activity: 8-22 mins PT General Charges $$ ACUTE PT VISIT: 1 Visit                      , 02/02/2023, 11:26 AM Elizabeth Palau, PT, DPT, GCS [phone]

## 2023-02-02 NOTE — TOC Progression Note (Addendum)
Transition of Care [facility]) - Progression Note    Patient Details  Name: Wendy Arias MRN: [id number] Date of Birth: [birth year]  Transition [facility]) CM/SW Contact  Marlowe Sax, RN Phone Number: 02/02/2023, 8:39 AM  Clinical Narrative:    [employer] has attempted to reach out to the patient on 3 occasions to get a Credit card on file for the wheelchair rentaL She was with the doctor the first time, and the second no one answered The third time they spoke with the gransd daughter but was not able to get the card on file, they will attempt again today, they called this morning and was told to call back around 1230   Expected Discharge Plan: Home w Home Health Services Barriers to Discharge: Continued Medical Work up  Expected Discharge Plan and Services   Discharge Planning Services: CM Consult   Living arrangements for the past 2 months: Single Family Home                 DME Arranged: Wheelchair manual DME Agency: [employer] Date DME Agency Contacted: 01/25/23 Time DME Agency Contacted: [phone] Representative spoke with at DME Agency: [facility] HH Arranged: PT, OT HH Agency: [facility] Date [facility] Agency Contacted: 02/01/23 Time HH Agency Contacted: [phone] Representative spoke with at [facility] Agency: Adelina Mings   Social Determinants of Health (SDOH) Interventions SDOH Screenings   Food Insecurity: No Food Insecurity (01/31/2023)  Housing: Low Risk  (01/31/2023)  Transportation Needs: No Transportation Needs (01/31/2023)  Utilities: Not At Risk (01/31/2023)  Depression (PHQ2-9): Low Risk  (01/27/2023)  Financial Resource Strain: High Risk (02/16/2018)  Physical Activity: Inactive (02/16/2018)  Social Connections: Unknown (02/16/2018)  Stress: Stress Concern Present (02/16/2018)  Tobacco Use: High Risk (01/31/2023)    Readmission Risk Interventions    07/14/2022   11:37 AM  Readmission Risk Prevention Plan  Transportation Screening Complete  PCP or Specialist Appt within 5-7 Days Complete   Home Care Screening Complete  Medication Review (RN CM) Complete

## 2023-02-03 DIAGNOSIS — G5771 Causalgia of right lower limb: Secondary | ICD-10-CM | POA: Diagnosis not present

## 2023-02-03 DIAGNOSIS — J441 Chronic obstructive pulmonary disease with (acute) exacerbation: Secondary | ICD-10-CM | POA: Diagnosis not present

## 2023-02-03 DIAGNOSIS — G5772 Causalgia of left lower limb: Secondary | ICD-10-CM

## 2023-02-03 DIAGNOSIS — S82892A Other fracture of left lower leg, initial encounter for closed fracture: Secondary | ICD-10-CM | POA: Diagnosis not present

## 2023-02-03 DIAGNOSIS — I739 Peripheral vascular disease, unspecified: Secondary | ICD-10-CM | POA: Diagnosis not present

## 2023-02-03 MED ORDER — POTASSIUM CHLORIDE 20 MEQ PO PACK
20.0000 meq | PACK | Freq: Once | ORAL | Status: AC
  Administered 2023-02-03: 20 meq via ORAL
  Filled 2023-02-03: qty 1

## 2023-02-03 MED ORDER — GABAPENTIN 300 MG PO CAPS
300.0000 mg | ORAL_CAPSULE | Freq: Every day | ORAL | Status: DC
  Administered 2023-02-04: 300 mg via ORAL
  Filled 2023-02-03: qty 1

## 2023-02-03 MED ORDER — PREGABALIN 75 MG PO CAPS
150.0000 mg | ORAL_CAPSULE | Freq: Three times a day (TID) | ORAL | Status: DC
  Administered 2023-02-03 – 2023-02-04 (×3): 150 mg via ORAL
  Filled 2023-02-03 (×3): qty 2

## 2023-02-03 MED ORDER — GABAPENTIN 300 MG PO CAPS
600.0000 mg | ORAL_CAPSULE | Freq: Every day | ORAL | Status: DC
  Administered 2023-02-03: 600 mg via ORAL
  Filled 2023-02-03: qty 2

## 2023-02-03 NOTE — Plan of Care (Signed)

## 2023-02-03 NOTE — Consult Note (Signed)
PHARMACY CONSULT NOTE - ELECTROLYTES  Pharmacy Consult for Electrolyte Monitoring and Replacement   Recent Labs: Height: 5' (152.4 cm) Weight: 63.5 kg (140 lb) IBW/kg (Calculated) : 45.5 Estimated Creatinine Clearance: 47.5 mL/min (by C-G formula based on SCr of 0.89 mg/dL). Potassium (mmol/L)  Date Value  02/03/2023 3.9  10/31/2013 4.5   Magnesium (mg/dL)  Date Value  11/91/4782 2.3   Calcium (mg/dL)  Date Value  95/62/1308 8.6 (L)   Calcium, Total (mg/dL)  Date Value  65/78/4696 9.1   Albumin (g/dL)  Date Value  29/52/8413 3.7  10/31/2013 4.0   Sodium (mmol/L)  Date Value  02/03/2023 142  10/31/2013 138    Assessment  Wendy Arias is a 72 y.o. female presenting with an episode of syncope and history of falls. Admitted for acute displacement of trimalleolar fracture of L ankle, AKI, hx syncope episodes, hypoK (8/5). PMH significant for COPD, HFpEF, CAD, HLD, HTN, PVD. Patient now in acute COPD exacerbation (8/7). AKI on admission due to dehydration on admission - continuing to resolve; SCr 1.54 >> 0.8 now at baseline. Pharmacy has been consulted to monitor and replace electrolytes.  Diet: Regular MIVF: Completed Medications Home torsemide not restarted Patient no longer taking furosemide  Defer metolazone per cardiology  Goal of Therapy: Electrolytes WNL K > 4 per cardiology Mg > 2 per cardiology  Plan:  Replaced K - 20 mEq once (8/8)  - to achieve goal of > 4 Continue to check BMP every AM  Thank you for allowing pharmacy to be a part of this patient's care.  Effie Shy, PharmD PGY1 Pharmacy Resident 02/03/2023 11:07 AM

## 2023-02-03 NOTE — Progress Notes (Signed)
Physical Therapy Treatment Patient Details Name: Wendy Arias MRN: [id number] DOB: [birth year] Today's Date: 02/03/2023   History of Present Illness Pt admitted for L LE ankle fx. Pt elected conservative management at this time. History includes COPD, cardiomyopathy and CRPS.    PT Comments  Pt is making good progress towards goals with ability to transfer from bed-chair using lateral transfer. Pt still needs encouragement to participate and is very limited by pain despite pain medicine given prior to session. Pt still deciding on surgery. If so, will need new orders post op. If pt doesn't go with surgery, will plan one more session for education on [facility] management and propulsion. Will continue to progress.   If plan is discharge home, recommend the following: A little help with walking and/or transfers;A lot of help with bathing/dressing/bathroom;Assist for transportation;Help with stairs or ramp for entrance;Supervision due to cognitive status   Can travel by private vehicle     No  Equipment Recommendations  Wheelchair (measurements PT);BSC/3in1    Recommendations for Other Services       Precautions / Restrictions Precautions Precautions: Fall Restrictions Weight Bearing Restrictions: Yes LLE Weight Bearing: Non weight bearing     Mobility  Bed Mobility Overal bed mobility: Needs Assistance Bed Mobility: Supine to Sit     Supine to sit: Supervision     General bed mobility comments: safe technique and follows commands well. Once seated at EOB, upright posture noted. Able to maintain correct WBing status    Transfers Overall transfer level: Needs assistance Equipment used: None Transfers: Bed to chair/wheelchair/BSC            Lateral/Scoot Transfers: Supervision General transfer comment: Pt able to perform lateral transfer with improved ease this date, only needing help for set up    Ambulation/Gait               General Gait Details: unable to  perform   Stairs             Wheelchair Mobility     Tilt Bed    Modified Rankin (Stroke Patients Only)       Balance Overall balance assessment: Needs assistance Sitting-balance support: Feet supported, Bilateral upper extremity supported Sitting balance-Leahy Scale: Good                                      Cognition Arousal: Alert Behavior During Therapy: WFL for tasks assessed/performed Overall Cognitive Status: Within Functional Limits for tasks assessed                                 General Comments: more pleasant today, alert and oriented        Exercises Other Exercises Other Exercises: Seated therex performed on B LE including LAQ, alt marching, and R ankle pump. 10 reps with supervision    General Comments        Pertinent Vitals/Pain Pain Assessment Pain Assessment: 0-10 Pain Score: 10-Worst pain ever Pain Location: L ankle Pain Descriptors / Indicators: Aching, Grimacing, Guarding Pain Intervention(s): Limited activity within patient's tolerance, Repositioned, Premedicated before session    Home Living                          Prior Function            PT  Goals (current goals can now be found in the care plan section) Acute Rehab PT Goals Patient Stated Goal: to go home PT Goal Formulation: With patient Time For Goal Achievement: 02/15/23 Potential to Achieve Goals: Good Progress towards PT goals: Progressing toward goals    Frequency    Min 1X/week      PT Plan      Co-evaluation              AM-PAC PT "6 Clicks" Mobility   Outcome Measure  Help needed turning from your back to your side while in a flat bed without using bedrails?: A Little Help needed moving from lying on your back to sitting on the side of a flat bed without using bedrails?: A Little Help needed moving to and from a bed to a chair (including a wheelchair)?: A Little Help needed standing up from a chair  using your arms (e.g., wheelchair or bedside chair)?: Total Help needed to walk in hospital room?: Total Help needed climbing 3-5 steps with a railing? : Total 6 Click Score: 12    End of Session   Activity Tolerance: Patient limited by pain Patient left: in chair (pt refused chair alarm) Nurse Communication: Mobility status PT Visit Diagnosis: Unsteadiness on feet (R26.81);Muscle weakness (generalized) (M62.81);History of falling (Z91.81);Difficulty in walking, not elsewhere classified (R26.2);Pain Pain - Right/Left: Left Pain - part of body: Ankle and joints of foot     Time: 6578-4696 PT Time Calculation (min) (ACUTE ONLY): 23 min  Charges:    $Therapeutic Exercise: 8-22 mins $Therapeutic Activity: 8-22 mins PT General Charges $$ ACUTE PT VISIT: 1 Visit                     Elizabeth Palau, PT, DPT, GCS [phone]    , 02/03/2023, 12:26 PM

## 2023-02-03 NOTE — Progress Notes (Signed)
Occupational Therapy Treatment Patient Details Name: Wendy Arias MRN: [id number] DOB: [birth year] Today's Date: 02/03/2023   History of present illness Pt admitted for L LE ankle fx. Pt elected conservative management at this time. History includes COPD, cardiomyopathy and CRPS.   OT comments  Pt seen for OT tx. Pt endorsing significant L ankle pain but declined OT to notify RN. Repositioned pillows for improved comfort. Pt/sister educated in AE for LB dressing, tub transfer bench, handheld shower head with holder near bench, tub transfers, and falls prevention to maximize safety/indep with ADL. Pt verbalized understanding. Pt continues to benefit from skilled OT services.      If plan is discharge home, recommend the following:  A lot of help with walking and/or transfers;A lot of help with bathing/dressing/bathroom;Assistance with cooking/housework;Assist for transportation;Help with stairs or ramp for entrance   Equipment Recommendations  BSC/3in1;Other (comment);Tub/shower bench (drop arm BSC)    Recommendations for Other Services      Precautions / Restrictions Precautions Precautions: Fall Restrictions Weight Bearing Restrictions: Yes LLE Weight Bearing: Non weight bearing       Mobility Bed Mobility               General bed mobility comments: NT in recliner    Transfers                   General transfer comment: deferred 2/2 L ankle pain     Balance                                           ADL either performed or assessed with clinical judgement   ADL Overall ADL's : Needs assistance/impaired                                            Extremity/Trunk Assessment              Vision       Perception     Praxis      Cognition Arousal: Alert Behavior During Therapy: WFL for tasks assessed/performed Overall Cognitive Status: Within Functional Limits for tasks assessed                                           Exercises Other Exercises Other Exercises: Pt/sister educated in AE for LB dressing, tub transfer bench, handheld shower head with holder near bench, tub transfers, and falls prevention    Shoulder Instructions       General Comments      Pertinent Vitals/ Pain       Pain Assessment Pain Assessment: Faces Faces Pain Scale: Hurts whole lot Pain Location: L ankle Pain Descriptors / Indicators: Aching, Grimacing, Guarding Pain Intervention(s): Monitored during session, Premedicated before session, Repositioned  Home Living                                          Prior Functioning/Environment              Frequency  Min 1X/week        Progress  Toward Goals  OT Goals(current goals can now be found in the care plan section)  Progress towards OT goals: Progressing toward goals  Acute Rehab OT Goals Patient Stated Goal: to decrease pain and return home OT Goal Formulation: With patient/family Time For Goal Achievement: 02/15/23 Potential to Achieve Goals: Fair  Plan      Co-evaluation                 AM-PAC OT "6 Clicks" Daily Activity     Outcome Measure   Help from another person eating meals?: None Help from another person taking care of personal grooming?: None Help from another person toileting, which includes using toliet, bedpan, or urinal?: A Lot Help from another person bathing (including washing, rinsing, drying)?: A Lot Help from another person to put on and taking off regular upper body clothing?: A Little Help from another person to put on and taking off regular lower body clothing?: A Lot 6 Click Score: 17    End of Session    OT Visit Diagnosis: Unsteadiness on feet (R26.81);Muscle weakness (generalized) (M62.81);History of falling (Z91.81)   Activity Tolerance Patient limited by pain   Patient Left in chair;with call bell/phone within reach;with chair alarm set;with family/visitor  present   Nurse Communication          Time: 1610-9604 OT Time Calculation (min): 14 min  Charges: OT General Charges $OT Visit: 1 Visit OT Treatments $Self Care/Home Management : 8-22 mins  Arman Filter., MPH, MS, OTR/L ascom [phone] 02/03/23, 12:11 PM

## 2023-02-03 NOTE — TOC Progression Note (Signed)
Transition of Care [facility]) - Progression Note    Patient Details  Name: Wendy Arias MRN: [id number] Date of Birth: 1951-02-13  Transition of Care [facility]) CM/SW Contact  Colette Ribas, [place] Phone Number: 02/03/2023, 2:38 PM  Clinical Narrative:     CSW reached out to patient, her sister confirmed the patient has the wheel chair.  Expected Discharge Plan: Home w Home Health Services Barriers to Discharge: Continued Medical Work up  Expected Discharge Plan and Services   Discharge Planning Services: CM Consult   Living arrangements for the past 2 months: Single Family Home                 DME Arranged: Wheelchair manual DME Agency: [employer] Date DME Agency Contacted: 01/25/23 Time DME Agency Contacted: [phone] Representative spoke with at DME Agency: [facility] HH Arranged: PT, OT HH Agency: [facility] Date [facility] Agency Contacted: 02/01/23 Time HH Agency Contacted: [phone] Representative spoke with at [facility] Agency: Adelina Mings   Social Determinants of Health (SDOH) Interventions SDOH Screenings   Food Insecurity: No Food Insecurity (01/31/2023)  Housing: Low Risk  (01/31/2023)  Transportation Needs: No Transportation Needs (01/31/2023)  Utilities: Not At Risk (01/31/2023)  Depression (PHQ2-9): Low Risk  (01/27/2023)  Financial Resource Strain: High Risk (02/16/2018)  Physical Activity: Inactive (02/16/2018)  Social Connections: Unknown (02/16/2018)  Stress: Stress Concern Present (02/16/2018)  Tobacco Use: High Risk (01/31/2023)    Readmission Risk Interventions    07/14/2022   11:37 AM  Readmission Risk Prevention Plan  Transportation Screening Complete  PCP or Specialist Appt within 5-7 Days Complete  Home Care Screening Complete  Medication Review (RN CM) Complete

## 2023-02-03 NOTE — Progress Notes (Signed)
Follow-up in regards to her left ankle and consideration for surgery.  Patient seen today with her sister and granddaughter at bedside.  Patient had multiple questions in regards to recovery from ankle fracture surgery.  We discussed these in great detail today.  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  I discussed there is no guarantee that she would not develop this and there is no way to know if this would occur until after the procedure.  At this time we discussed the alignment of the ankle looks to be adequate and I demonstrated the x-rays.  They felt very comfortable with just continue conservative treatment.  As long as she has no further injury and there is no shift in the bone we should be able to keep her in a cast or splint for an extended time to allow healing.  She felt comfortable with this plan.  She will remain nonweightbearing.  I will see her back next week for repeat x-rays reevaluation and possible cast immobilization.  We discussed vascular invention just in case she plans on surgery but once again she defers this for now.  From a podiatry standpoint she is stable for discharge.

## 2023-02-03 NOTE — Consult Note (Signed)
Hospital Consult    Reason for Consult:  Left Ankle Fracture with Monophasic wave forms on U/S Requesting Physician:  Dr Gwyneth Revels MD MRN #:  [id number]  History of Present Illness: This is a 72 y.o. female with medical history significant of COPD not on home oxygen, depression, complex regional pain syndrome, heart failure with preserved ejection fraction, coronary artery disease status post stent, hyperlipidemia, hypertension, peripheral vascular disease who was otherwise well until this afternoon when she was sitting down with family and decided to get up to the restroom and not long afterwards experienced a near syncopal episode falling down and landed on her buttocks.   On exam this morning the patient rests comfortable in bed.  Patient's sister is at the bedside with her.  We had a long discussion about why vein and vascular was consulted.  I let both know that the ultrasounds to her left lower extremity where she has had this ankle fracture reveal a possible stenosis to her left lower extremity.  This may be from the prior surgery she had last year (Bilateral Femoral Endarterectomies).  Currently both she and her sister are undecided about having ankle surgery versus conservative therapy.  I informed them both podiatry asked that we evaluate blood flow before surgery to determine whether or not she would be able to heal properly.  They both verbalized her understanding and wished to speak to the hospitalist taking care of them as well as podiatry before making any decisions.  I did discuss with both of them the plan going forward if she decides to have surgery and angiogram would need to be done prior.  She verbalizes she has had an angiogram in the past and is well versed in what this would require of her.  I also informed her if she chooses conservative therapy we could arrange a left lower extremity angiogram as an outpatient.  I suggested that she do this regardless of whether she has  surgery or not due to the results from this admission's ultrasound results.  Both the patient and her sister verbalized her understanding.  I answered all their questions today.  Past Medical History:  Diagnosis Date   (HFpEF) heart failure with preserved ejection fraction (HCC) 06/07/2004   a.) TTE 06/07/2004: EF 50-55%; inf HK. b.) TTE 04/17/2018: EF 45%; GLS -14.6%; inferoseptal and inferoposterior HK; sev LA enlargement; mild PR, mod TR, sev MR; G2DD. c.) TTE 12/05/2018: EF >55%, mild LVH; mild MR. d.) TTE 12/18/2020: EF >55%; mild LA enlargement; triv TR/PR, mod MR; G1DD.   Anxiety    Aortic atherosclerosis (HCC)    Asthma    CAD (coronary artery disease) 06/08/2004   a.) NSTEMI 06/05/2004. b.) LHC 06/08/2004: EF 70%; 50% mLAD, 50% dLAD, 99% OM3, 50% pRCA, 50% mRCA; PCI to OM3 placing a DES x 1 (unknown type). c.) Lexi scan 09/14/2021: normal LV function with no ischemia or scar.   COPD (chronic obstructive pulmonary disease) (HCC)    Depression    Diverticulosis    DOE (dyspnea on exertion)    Empyema of right pleural space (HCC) 02/16/2018   HLD (hyperlipidemia)    Hypertension    IDA (iron deficiency anemia)    Ischemic cardiomyopathy    NSTEMI (non-ST elevated myocardial infarction) (HCC) 06/05/2004   a.)  PCI on 06/08/2004 placing a DES x1 (unknown type) to 99% lesion in OM3.   Osteoporosis    Peripheral vascular disease (HCC)    PONV (postoperative nausea and vomiting)  Past Surgical History:  Procedure Laterality Date   COLONOSCOPY WITH PROPOFOL N/A 04/12/2017   Procedure: COLONOSCOPY WITH PROPOFOL;  Surgeon: Christena Deem, MD;  Location: [facility] ENDOSCOPY;  Service: Endoscopy;  Laterality: N/A;   CORONARY ANGIOPLASTY WITH STENT PLACEMENT Left 06/08/2004   Procedure: CORONARY ANGIOPLASTY WITH STENT PLACEMENT; Location: [facility]; Surgeon: Rudean Hitt, MD   ENDARTERECTOMY FEMORAL Bilateral 09/16/2021   Procedure: ENDARTERECTOMY FEMORAL;  Surgeon: Annice Needy, MD;   Location: [facility] ORS;  Service: Vascular;  Laterality: Bilateral;   ENDOBRONCHIAL ULTRASOUND N/A 11/28/2017   Procedure: ENDOBRONCHIAL ULTRASOUND;  Surgeon: Shane Crutch, MD;  Location: [facility] ORS;  Service: Pulmonary;  Laterality: N/A;   INSERTION OF ILIAC STENT Bilateral 09/16/2021   Procedure: INSERTION OF ILIAC STENT;  Surgeon: Annice Needy, MD;  Location: [facility] ORS;  Service: Vascular;  Laterality: Bilateral;   LOWER EXTREMITY ANGIOGRAPHY Right 08/12/2021   Procedure: Lower Extremity Angiography;  Surgeon: Annice Needy, MD;  Location: [facility] INVASIVE CV LAB;  Service: Cardiovascular;  Laterality: Right;   PARTIAL HYSTERECTOMY N/A    REDUCTION MAMMAPLASTY     VIDEO ASSISTED THORACOSCOPY (VATS)/THOROCOTOMY Right 03/2018   Procedure: VIDEO ASSISTED THORACOSCOPY (VATS)/THOROCOTOMY (partial decortication, loculated empyema); Location: [facility]    Allergies  Allergen Reactions   Penicillins Other (See Comments)    Has patient had a PCN reaction causing immediate rash, facial/tongue/throat swelling, SOB or lightheadedness with hypotension: Yes Has patient had a PCN reaction causing severe rash involving mucus membranes or skin necrosis: Unknown Has patient had a PCN reaction that required hospitalization: Unknown Has patient had a PCN reaction occurring within the last 10 years: Unknown If all of the above answers are "NO", then may proceed with Cephalosporin use. Pt has taken augmentin before    Prior to Admission medications   Medication Sig Start Date End Date Taking? Authorizing Provider  albuterol (PROVENTIL HFA;VENTOLIN HFA) 108 (90 Base) MCG/ACT inhaler Inhale into the lungs every 6 (six) hours as needed for wheezing or shortness of breath.   Yes [provider]  amitriptyline (ELAVIL) 100 MG tablet Take 100 mg by mouth at bedtime. 06/02/22 06/02/23 Yes [provider]  aspirin EC 81 MG tablet Take 1 tablet (81 mg total) by mouth daily. 08/12/21  Yes Dew, Marlow Baars, MD   atorvastatin (LIPITOR) 40 MG tablet Take 40 mg by mouth daily. 05/18/21  Yes [provider]  clopidogrel (PLAVIX) 75 MG tablet TAKE 1 TABLET BY MOUTH DAILY AT 6AM Patient taking differently: Take 75 mg by mouth daily. 04/30/22  Yes Georgiana Spinner, NP  gabapentin (NEURONTIN) 300 MG capsule Take 2 capsules (600 mg total) by mouth at bedtime. Patient taking differently: Take 600 mg by mouth 2 (two) times daily. One capsule in the morning, 2 capsules at bedtime 10/29/21  Yes Georgiana Spinner, NP  HYDROcodone-acetaminophen (NORCO/VICODIN) 5-325 MG tablet Take 1 tablet by mouth 2 (two) times daily as needed for moderate pain. 07/01/22  Yes [provider]  hydrOXYzine (ATARAX) 25 MG tablet TAKE ONE (1) TABLET BY MOUTH TWO TIMES PER DAY AS NEEDED FOR ITCHING 01/26/23  Yes [provider]  Magnesium 400 MG CAPS Take 1 tablet by mouth daily.   Yes [provider]  metolazone (ZAROXOLYN) 5 MG tablet Take 1 tablet (5 mg) once daily 30 minutes before torsemide for 3-5 days. 01/20/23  Yes [provider]  nitroGLYCERIN (NITROSTAT) 0.4 MG SL tablet Place under the tongue. 01/20/23 01/20/24 Yes [provider]  pregabalin (LYRICA) 150 MG  capsule Take 150 mg by mouth 3 (three) times daily. 07/05/22  Yes [provider]  torsemide (DEMADEX) 20 MG tablet Take 20 mg by mouth daily.   Yes [provider]  TRELEGY ELLIPTA 100-62.5-25 MCG/ACT AEPB Inhale 1 puff into the lungs daily. 05/30/21  Yes Enedina Finner, MD  vitamin B-12 (CYANOCOBALAMIN) 1000 MCG tablet Take 1,000 mcg by mouth daily.   Yes [provider]  Vitamin E 268 MG (400 UNIT) CAPS Take 400 Units by mouth daily.   Yes [provider]  furosemide (LASIX) 20 MG tablet Take 20 mg by mouth daily. Patient not taking: Reported on 01/27/2023 09/02/22   [provider]  ipratropium-albuterol (DUONEB) 0.5-2.5 (3) MG/3ML SOLN Take 3 mLs by nebulization 3 (three) times daily. 07/15/22  08/14/22  Delfino Lovett, MD  predniSONE (STERAPRED UNI-PAK 21 TAB) 10 MG (21) TBPK tablet Start 60 mg po daily, taper 10 mg daily until finish Patient not taking: Reported on 01/27/2023 07/15/22   Delfino Lovett, MD  tiZANidine (ZANAFLEX) 2 MG tablet Take 1-2 tablets (2-4 mg total) by mouth at bedtime as needed for muscle spasms. 01/27/23 03/28/23  Edward Jolly, MD    Social History   Socioeconomic History   Marital status: Divorced    Spouse name: Not on file   Number of children: Not on file   Years of education: Not on file   Highest education level: Not on file  Occupational History   Not on file  Tobacco Use   Smoking status: Some Days    Current packs/day: 0.50    Average packs/day: 0.5 packs/day for 20.0 years (10.0 ttl pk-yrs)    Types: Cigarettes   Smokeless tobacco: Never  Vaping Use   Vaping status: Never Used  Substance and Sexual Activity   Alcohol use: Never    Alcohol/week: 3.0 standard drinks of alcohol    Types: 1 Glasses of wine, 2 Cans of beer per week    Comment: rarely   Drug use: Never   Sexual activity: Not Currently  Other Topics Concern   Not on file  Social History Narrative   Not on file   Social Determinants of Health   Financial Resource Strain: High Risk (02/16/2018)   Overall Financial Resource Strain (CARDIA)    Difficulty of Paying Living Expenses: Very hard  Food Insecurity: No Food Insecurity (01/31/2023)   Hunger Vital Sign    Worried About Running Out of Food in the Last Year: Never true    Ran Out of Food in the Last Year: Never true  Transportation Needs: No Transportation Needs (01/31/2023)   PRAPARE - Administrator, Civil Service (Medical): No    Lack of Transportation (Non-Medical): No  Physical Activity: Inactive (02/16/2018)   Exercise Vital Sign    Days of Exercise per Week: 0 days    Minutes of Exercise per Session: 0 min  Stress: Stress Concern Present (02/16/2018)   [employer] of Occupational Health - Occupational  Stress Questionnaire    Feeling of Stress : Rather much  Social Connections: Unknown (02/16/2018)   Social Connection and Isolation Panel [NHANES]    Frequency of Communication with Friends and Family: More than three times a week    Frequency of Social Gatherings with Friends and Family: More than three times a week    Attends Religious Services: Patient declined    Active Member of Clubs or Organizations: Patient declined    Attends Banker Meetings: Patient declined  Marital Status: Divorced  Catering manager Violence: Not At Risk (01/31/2023)   Humiliation, Afraid, Rape, and Kick questionnaire    Fear of Current or Ex-Partner: No    Emotionally Abused: No    Physically Abused: No    Sexually Abused: No     Family History  Problem Relation Age of Onset   Breast cancer Mother 12   Hypertension Mother    Dementia Mother    Breast cancer Maternal Aunt 73   Breast cancer Other 35   Hypertension Father    Heart disease Father    Diabetes Father    Diabetes Sister     ROS: Otherwise negative unless mentioned in HPI  Physical Examination  Vitals:   02/02/23 2057 02/02/23 2334  BP:  106/79  Pulse:  76  Resp:  20  Temp:  98.6 F (37 C)  SpO2: 96% 91%   Body mass index is 27.34 kg/m.  General:  WDWN in NAD Gait: Not observed HENT: WNL, normocephalic Pulmonary: normal non-labored breathing, without Rales, rhonchi,  wheezing Cardiac: regular, without  Murmurs, rubs or gallops; without carotid bruits Abdomen: positive bowel sounds throughout, soft, NT/ND, no masses Skin: without rashes Vascular Exam/Pulses: Palpable pulses throughout.  Extremities: without ischemic changes, without Gangrene , without cellulitis; without open wounds;  Musculoskeletal: no muscle wasting or atrophy  Neurologic: A&O X 3;  No focal weakness or paresthesias are detected; speech is fluent/normal Psychiatric:  The pt has Normal affect. Lymph:  Unremarkable  CBC    Component  Value Date/Time   WBC 11.1 (H) 02/02/2023 0445   RBC 4.33 02/02/2023 0445   HGB 12.4 02/02/2023 0445   HGB 13.6 10/31/2013 1541   HCT 38.2 02/02/2023 0445   HCT 41.4 10/31/2013 1541   PLT 182 02/02/2023 0445   PLT 272 10/31/2013 1541   MCV 88.2 02/02/2023 0445   MCV 91 10/31/2013 1541   MCH 28.6 02/02/2023 0445   MCHC 32.5 02/02/2023 0445   RDW 14.7 02/02/2023 0445   RDW 13.5 10/31/2013 1541   LYMPHSABS 1.1 02/02/2023 0445   LYMPHSABS 2.0 10/31/2013 1541   MONOABS 0.6 02/02/2023 0445   MONOABS 0.3 10/31/2013 1541   EOSABS 0.1 02/02/2023 0445   EOSABS 0.2 10/31/2013 1541   BASOSABS 0.0 02/02/2023 0445   BASOSABS 0.1 10/31/2013 1541    BMET    Component Value Date/Time   NA 142 02/03/2023 0356   NA 138 10/31/2013 1541   K 3.9 02/03/2023 0356   K 4.5 10/31/2013 1541   CL 103 02/03/2023 0356   CL 101 10/31/2013 1541   CO2 31 02/03/2023 0356   CO2 29 10/31/2013 1541   GLUCOSE 137 (H) 02/03/2023 0356   GLUCOSE 96 10/31/2013 1541   BUN 30 (H) 02/03/2023 0356   BUN 10 10/31/2013 1541   CREATININE 0.89 02/03/2023 0356   CREATININE 0.91 10/31/2013 1541   CALCIUM 8.6 (L) 02/03/2023 0356   CALCIUM 9.1 10/31/2013 1541   GFRNONAA >60 02/03/2023 0356   GFRNONAA >60 10/31/2013 1541   GFRAA >60 04/13/2018 1030   GFRAA >60 10/31/2013 1541    COAGS: Lab Results  Component Value Date   INR 1.1 07/13/2022   INR 1.1 05/26/2021   INR 1.21 04/12/2018     Non-Invasive Vascular Imaging:   EXAM:02/02/2023 LEFT LOWER EXTREMITY ARTERIAL DUPLEX SCAN   TECHNIQUE: Gray-scale sonography as well as color Doppler and duplex ultrasound was performed to evaluate the lower extremity arteries including the common, superficial and profunda femoral arteries,  popliteal artery and calf arteries.   COMPARISON:  CT 01/05/2022   FINDINGS: Left lower Extremity   ABI: Not calculated   Inflow: Monophasic waveforms.  Normal color Doppler signal.   Outflow: Monophasic waveforms throughout.  Elevated peak systolic velocities in the mid SFA with some eccentric partially calcified plaque. Normal velocities and color signal in the visualized portions of deep femoral artery and popliteal artery.   Runoff: Monophasic waveforms throughout. Nonvisualization of distal posterior tibial artery due to overlying splint.   posterior and anterior tibial arterial waveforms and velocities. Vessels are patent to the ankle.   IMPRESSION: 1. Monophasic waveforms throughout left lower extremity. 2. Elevated peak systolic velocities in the mid SFA, suggesting at least mild stenosis. 3. Nonvisualization of distal posterior tibial artery.  Statin:  Yes.   Beta Blocker:  No. Aspirin:  Yes.   ACEI:  No. ARB:  No. CCB use:  No Other antiplatelets/anticoagulants:  Yes.   Plavix 75 mg daily.    ASSESSMENT/PLAN: This is a 72 y.o. female who presents to [facility] emergency department post a syncopal episode at home.  Patient is resting comfortably in bed this morning with her sister at the bedside.  PLAN: Podiatry has seen the patient this afternoon and the patient has decided against having surgery and would rather have conservative therapy to help heal her ankle fracture.  I was informed by Dr. Gilman Schmidt of podiatry but the patient also refuses any inpatient angiogram to her left lower extremity at this time.  The patient endorses she will schedule a follow-up appointment with [place] as she recovers from her ankle fracture.  Therefore I did not schedule a follow-up appoint with [place] today.  I sent a message to the covering hospitalists Dr. Esaw Grandchild endorsing vascular surgery is okay with patient being discharged today.   -I discussed the plan in detail with Dr Festus Barren MD and he agrees with the plan.    Marcie Bal [facility] 02/03/2023 7:18 AM

## 2023-02-03 NOTE — Progress Notes (Addendum)
Progress Note   Patient: Wendy Arias:[id number] DOB: April 20, 1951 DOA: 01/31/2023     3 DOS: the patient was seen and examined on 02/03/2023    Subjective:  Patient seen and examined at bedside this morning in the presence of her sister. Patient has already been seen by podiatrist this morning and after long discussion patient has opted for conservative management. PT OT have been requested with recommendation for skilled nursing facility placement however patient wants to go home when medically stable. Patient also found to have wheezing this morning with associated decreasing oxygen levels. Denies chest pain nausea vomiting abdominal pain. Chest x-ray report reviewed by me did not show any evidence of vascular congestion or infiltrates    Brief hospital course: Wendy Arias is a 72 y.o. female with medical history significant of COPD not on home oxygen, depression, complex regional pain syndrome, heart failure with preserved ejection fraction, coronary artery disease status post stent, hyperlipidemia, hypertension, peripheral vascular disease who was otherwise well until this afternoon when she was sitting down with family and decided to get up to the restroom and not long afterwards experienced a near syncopal episode falling down and landed on her buttocks.  According to patient she did not lose consciousness and did not hit in the head.  She immediately started feeling left ankle pain with intensity 10/10 with difficulty walking and therefore brought to the emergency room for further management.  Upon arrival patient underwent x-ray of the left ankle that showed a trimalleolar fracture.  Podiatry was therefore contacted who agreed for patient to be admitted for further management.  In the emergency room lab reviewed showed findings of AKI.  Patient also appeared clinically dehydrated.    Assessment and Plan:   Acute displaced trimalleolar fracture of the left ankle with intra-articular  extension Podiatry consulted we appreciate input Have discussed the plan of care with podiatry 8/7 -- pt was declining surgery, but now reconsidering.  Podiatry informed and will see pt this afternoon to discuss details and answer pt and sister's questions. Continue PT OT  Recommendation is for SNF/rehab, patient wants to go home Pt's home pain regimen continued but pain was uncontrolled.   Updated regimen 8/7 -- scheduled Tylenol, and PRN oxycodone 5-10 mg PRN IV Dilaudid for breakthrough pain only Monitor respiratory status and mentation closely with use of opiates  Non-weight bearing on left leg   Acute kidney injury  - resolved 8/7 secondary to dehydration in the setting of diuretic use and poor oral intake Patient creatinine at baseline is within normal limit however presented with creatinine 1.54 Stop IV fluids Monitor BMP daily Avoid nephrotoxic agents Renally dose all drugs     Near syncope likely secondary to dehydration with possible underlying orthostatic hypotension Monitor orthostatic vitals closely Stop IV fluids   Severe hypokalemia- resolved with replacement Presented with potassium 2.2 likely secondary to diuretic effect. 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 Continue amitriptyline   Complex regional pain syndrome,  Acute on chronic pain due to ankle fracture Patient follows up with an outpatient pain physician Takes Norco, gabapentin as well as  Lyrica -- current pain regimen includes oxycodone instead of Norco given ankle fracture Adjust regimen for adequate pain control   Chronic congestive  heart failure with preserved ejection fraction,  Last echo was in January 2024 that showed ejection fraction 55 to 60% with diastolic function indeterminate Continue to monitor fluid status closely Monitor daily weight Continue holding diuretics -- AKI resolved with IV hydration Would resume diuretics cautiously given severe volume depletion and near-syncope on admission   Coronary artery disease status post stent Continue aspirin ,Plavix and statin    Hyperlipidemia Continue statin    Hypertension Continue holding antihypertensives due to relative hypotension   Peripheral vascular disease  Continue aspirin and statin       Advance Care Planning:   Code Status: Full code     Consults: Podiatry, Vascular Surgery    Family Communication: sister present at bedside 8/7, 8/8    Physical Exam: General exam: awake, alert, no some distress related to pain HEENT: moist mucus membranes, hearing grossly normal  Respiratory system: lungs clear, now wheezes,normal respiratory effort, on room air. Cardiovascular system: normal S1/S2, RRR, no pedal edema.   Gastrointestinal system: soft, NT, ND Central nervous system: A&O x3. no gross focal neurologic deficits, normal speech Extremities: left ankle in clean dry intact dressing, no edema, normal tone Skin: dry, intact, normal temperature Psychiatry: anxious mood, congruent affect, judgement and insight appear normal   Vitals:   02/02/23 2057 02/02/23 2334 02/03/23 0755 02/03/23 0820  BP:  106/79  133/71  Pulse:  76  74  Resp:  20  16  Temp:  98.6 F (37 C)  98.2 F (36.8 C)  TempSrc:    Oral  SpO2: 96% 91% 92% 92%  Weight:      Height:        Data Reviewed:  Notable labs --- glucose 137, BUN 30, Cr improved 0.89, ca 8.6 Last wbc 11.1 (steroid-induced)   Disposition: Status is: Inpatient Remains inpatient appropriate because: Requiring IV medications for pain control.  Anticipate d/c home with HH in 24-48 hours if pain  controlled and COPD exacerbation improved.   Time spent: 45 minutes  Author: Pennie Banter, DO 02/03/2023 12:55 PM  For on call review [email or web address].

## 2023-02-03 NOTE — Care Management Important Message (Signed)
Important Message  Patient Details  Name: Wendy Arias MRN: [id number] Date of Birth: February 07, 1951   Medicare Important Message Given:  N/A - LOS <3 / Initial given by admissions     Olegario Messier A  02/03/2023, 8:48 AM

## 2023-02-04 DIAGNOSIS — J9601 Acute respiratory failure with hypoxia: Secondary | ICD-10-CM | POA: Diagnosis not present

## 2023-02-04 DIAGNOSIS — J441 Chronic obstructive pulmonary disease with (acute) exacerbation: Secondary | ICD-10-CM | POA: Diagnosis not present

## 2023-02-04 DIAGNOSIS — I5032 Chronic diastolic (congestive) heart failure: Secondary | ICD-10-CM | POA: Diagnosis not present

## 2023-02-04 DIAGNOSIS — G5771 Causalgia of right lower limb: Secondary | ICD-10-CM | POA: Diagnosis not present

## 2023-02-04 SURGERY — LOWER EXTREMITY ANGIOGRAPHY
Anesthesia: Moderate Sedation | Laterality: Left

## 2023-02-04 MED ORDER — IPRATROPIUM-ALBUTEROL 0.5-2.5 (3) MG/3ML IN SOLN: 3.0000 mL | Freq: Four times a day (QID) | RESPIRATORY_TRACT | 0 refills | Status: AC | PRN

## 2023-02-04 MED ORDER — ACETAMINOPHEN 500 MG PO TABS: 1000.0000 mg | ORAL_TABLET | Freq: Three times a day (TID) | ORAL | Status: DC

## 2023-02-04 MED ORDER — OXYCODONE HCL 5 MG PO TABS
10.0000 mg | ORAL_TABLET | Freq: Once | ORAL | Status: AC
  Administered 2023-02-04: 10 mg via ORAL

## 2023-02-04 MED ORDER — PREDNISONE 20 MG PO TABS: 40.0000 mg | ORAL_TABLET | Freq: Every day | ORAL | 0 refills | Status: AC

## 2023-02-04 MED ORDER — SENNOSIDES-DOCUSATE SODIUM 8.6-50 MG PO TABS: 2.0000 | ORAL_TABLET | Freq: Every day | ORAL | 0 refills | Status: DC

## 2023-02-04 MED ORDER — OXYCODONE HCL 5 MG PO TABS: 5.0000 mg | ORAL_TABLET | ORAL | 0 refills | Status: DC | PRN

## 2023-02-04 MED ORDER — TORSEMIDE 20 MG PO TABS: 20.0000 mg | ORAL_TABLET | Freq: Every day | ORAL | 1 refills | Status: DC | PRN

## 2023-02-04 NOTE — Progress Notes (Signed)
Occupational Therapy Treatment Patient Details Name: Wendy Arias MRN: [id number] DOB: 16-Jun-1951 Today's Date: 02/04/2023   History of present illness Pt admitted for L LE ankle fx. Pt elected conservative management at this time. History includes COPD, cardiomyopathy and CRPS.   OT comments  Pt seen for OT tx. Pt denies pain, however, expresses frustrations with pain control overnight. Pt completed sup>sit with supervision. Pt instructed in SPT transfer with RW, requiring CGA and PRN VC to complete for EOB<>BSC. Pt instructed in AE for LB dressing and able to return demo with CGA in standing for clothing mgt after toileting. Setup and CGA for standing pericare requiring alternating UE support on RW for stability. SpO2 down to 87% on room air, requiring 2L to maintain 90-92%. Pt educated in PLB to support recovery with exertional activity. Left on 2L. RN aware. Pt progressing towards goals. Continues to benefit from skilled OT services. Left seated EOB with sister for visit, all needs in reach.       If plan is discharge home, recommend the following:  A lot of help with walking and/or transfers;A lot of help with bathing/dressing/bathroom;Assistance with cooking/housework;Assist for transportation;Help with stairs or ramp for entrance   Equipment Recommendations  BSC/3in1;Other (comment);Tub/shower bench (drop arm BSC)    Recommendations for Other Services      Precautions / Restrictions Precautions Precautions: Fall Restrictions Weight Bearing Restrictions: Yes LLE Weight Bearing: Non weight bearing       Mobility Bed Mobility Overal bed mobility: Needs Assistance Bed Mobility: Supine to Sit     Supine to sit: Supervision, HOB elevated          Transfers Overall transfer level: Needs assistance Equipment used: Rolling walker (2 wheels) Transfers: Sit to/from Stand, Bed to chair/wheelchair/BSC Sit to Stand: Contact guard assist Stand pivot transfers: Contact guard  assist         General transfer comment: PRN VC for NWBing     Balance Overall balance assessment: Needs assistance Sitting-balance support: Feet supported, No upper extremity supported Sitting balance-Leahy Scale: Good     Standing balance support: During functional activity, Reliant on assistive device for balance, Single extremity supported Standing balance-Leahy Scale: Fair Standing balance comment: fair during clothing mgt and pericare in standing                           ADL either performed or assessed with clinical judgement   ADL Overall ADL's : Needs assistance/impaired                         Toilet Transfer: Contact guard assist;Minimal assistance;BSC/3in1;Rolling walker (2 wheels);Stand-pivot;Cueing for safety Toilet Transfer Details (indicate cue type and reason): PRN VC for NWBing and sequencing wiht RW Toileting- Clothing Manipulation and Hygiene: Contact guard assist;Set up;Sit to/from stand Toileting - Clothing Manipulation Details (indicate cue type and reason): CGA for clothing mgt with use of reacher for LB dressing after initial instruction; CGA and set up for STS pericare     Functional mobility during ADLs: Contact guard assist;Cueing for safety;Cueing for sequencing;Rolling walker (2 wheels)      Extremity/Trunk Assessment              Vision       Perception     Praxis      Cognition Arousal: Alert Behavior During Therapy: WFL for tasks assessed/performed Overall Cognitive Status: Within Functional Limits for tasks assessed  Exercises Other Exercises Other Exercises: Pt educated in AE for LB dressing, SPT EOB<>BSC, educated in PLB to support breath recovery    Shoulder Instructions       General Comments      Pertinent Vitals/ Pain       Pain Assessment Pain Assessment: No/denies pain  Home Living                                           Prior Functioning/Environment              Frequency  Min 1X/week        Progress Toward Goals  OT Goals(current goals can now be found in the care plan section)  Progress towards OT goals: Progressing toward goals  Acute Rehab OT Goals Patient Stated Goal: to decrease pain and return home OT Goal Formulation: With patient/family Time For Goal Achievement: 02/15/23 Potential to Achieve Goals: Good  Plan      Co-evaluation                 AM-PAC OT "6 Clicks" Daily Activity     Outcome Measure   Help from another person eating meals?: None Help from another person taking care of personal grooming?: None Help from another person toileting, which includes using toliet, bedpan, or urinal?: A Little Help from another person bathing (including washing, rinsing, drying)?: A Lot Help from another person to put on and taking off regular upper body clothing?: A Little Help from another person to put on and taking off regular lower body clothing?: A Little 6 Click Score: 19    End of Session Equipment Utilized During Treatment: Oxygen;Gait belt;Rolling walker (2 wheels)  OT Visit Diagnosis: Unsteadiness on feet (R26.81);Muscle weakness (generalized) (M62.81);History of falling (Z91.81)   Activity Tolerance Patient tolerated treatment well   Patient Left with call bell/phone within reach;Other (comment);with family/visitor present (left seated EOB, call bell, with sister, RN aware)   Nurse Communication Mobility status;Other (comment) (SpO2)        Time: 2440-1027 OT Time Calculation (min): 35 min  Charges: OT General Charges $OT Visit: 1 Visit OT Treatments $Self Care/Home Management : 23-37 mins  Arman Filter., MPH, MS, OTR/L ascom [phone] 02/04/23, 10:54 AM

## 2023-02-04 NOTE — Plan of Care (Signed)

## 2023-02-04 NOTE — Consult Note (Signed)
PHARMACY CONSULT NOTE - ELECTROLYTES  Pharmacy Consult for Electrolyte Monitoring and Replacement   Recent Labs: Height: 5' (152.4 cm) Weight: 63.5 kg (140 lb) IBW/kg (Calculated) : 45.5 Estimated Creatinine Clearance: 52.9 mL/min (by C-G formula based on SCr of 0.76 mg/dL). Potassium (mmol/L)  Date Value  02/04/2023 4.2  10/31/2013 4.5   Magnesium (mg/dL)  Date Value  16/03/9603 2.3   Calcium (mg/dL)  Date Value  54/02/8118 8.1 (L)   Calcium, Total (mg/dL)  Date Value  14/78/2956 9.1   Albumin (g/dL)  Date Value  21/30/8657 3.7  10/31/2013 4.0   Sodium (mmol/L)  Date Value  02/04/2023 141  10/31/2013 138    Assessment  Wendy Arias is a 72 y.o. female presenting with an episode of syncope and history of falls. Admitted for acute displacement of trimalleolar fracture of L ankle, AKI, hx syncope episodes, hypoK (8/5). PMH significant for COPD, HFpEF, CAD, HLD, HTN, PVD. Patient now in acute COPD exacerbation (8/7). AKI on admission due to dehydration on admission - continuing to resolve; SCr 1.54 >> 0.8 now at baseline. Pharmacy has been consulted to monitor and replace electrolytes.  Diet: Regular MIVF: Completed Medications Home torsemide not restarted Patient no longer taking furosemide  Defer metolazone per cardiology  Goal of Therapy: Electrolytes WNL K > 4 per cardiology Mg > 2 per cardiology  Plan:  No replacement at this time Continue to check BMP every AM  Thank you for allowing pharmacy to be a part of this patient's care.  Bari Mantis PharmD Clinical Pharmacist 02/04/2023

## 2023-02-04 NOTE — Discharge Summary (Signed)
Physician Discharge Summary   Patient: NIMCO MCHENRY MRN: [id number] DOB: December 06, 1950  Admit date:     01/31/2023  Discharge date: 02/04/23  Discharge Physician: Pennie Banter   PCP: Wilford Corner, PA-C   Recommendations at discharge:   Follow up with Podiatry as scheduled Follow up with Primary Care Follow up with Cardiology Repeat CBC, BMP, Mg in 1-2 weeks Follow up on patient's oxygen requirement. At time of discharge, requires 2 L/min with exertion.   Discharge Diagnoses: Principal Problem:   Closed left ankle fracture Active Problems:   COPD exacerbation (HCC)   Hepatic cirrhosis (HCC)   Essential hypertension   HLD (hyperlipidemia)   CAD (coronary artery disease)   Anemia of chronic disease   Anxiety and depression   Chronic diastolic CHF (congestive heart failure), NYHA class 3 (HCC)   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   IDA (iron deficiency anemia)   Chronic insomnia   Acute respiratory failure with hypoxia (HCC)   Peripheral vascular disease (HCC) (hx of femoral endarterectomy September 17 2021)  Resolved Problems:   * No resolved hospital problems. *  Hospital Course:  "KEYANAH OVER is a 72 y.o. female with medical history significant of COPD not on home oxygen, depression, complex regional pain syndrome, heart failure with preserved ejection fraction, coronary artery disease status post stent, hyperlipidemia, hypertension, peripheral vascular disease who was otherwise well until this afternoon when she was sitting down with family and decided to get up to the restroom and not long afterwards experienced a near syncopal episode falling down and landed on her buttocks.  According to patient she did not lose consciousness and did not hit in the head.  She immediately started feeling left ankle pain with intensity 10/10 with difficulty walking and therefore brought to the emergency room for further management.  Upon arrival patient  underwent x-ray of the left ankle that showed a trimalleolar fracture.  Podiatry was therefore contacted who agreed for patient to be admitted for further management.  In the emergency room lab reviewed showed findings of AKI.  Patient also appeared clinically dehydrated. "  Further hospital course and management as outlined below.  02/04/23 -- pt feels well overall. Pain controlled on oral medications.  She and sister at bedside are in agreement for discharge home today with home health services.  Pt not pleased about need to oxygen, but expresses understanding and agrees to use as needed. Pt is medically stable for discharge home today.   Assessment and Plan:  Acute displaced trimalleolar fracture of the left ankle with intra-articular extension Podiatry consulted we appreciate input Have discussed the plan of care with podiatry 8/7 -- pt was declining surgery, but now reconsidering.  Podiatry informed and will see pt this afternoon to discuss details and answer pt and sister's questions. Continue PT OT -- HH recommended, arranged.   Wheelchair ordered & delivered to room before d/c. Pt's home pain regimen continued but pain was uncontrolled.   Updated regimen 8/7 -- scheduled Tylenol, and PRN oxycodone 5-10 mg PRN No longer needing IV Dilaudid for breakthrough pain  Monitor respiratory status and mentation closely with use of opiates  Non-weight bearing on left leg Podiatry follow up outpatient   Acute kidney injury  - resolved 8/7 Due to dehydration in the setting of diuretic use and poor oral intake Presented with creatinine 1.54 Cr today is 0.76 Stable off IV fluids Monitor BMP daily   Near syncope likely secondary  to dehydration with possible underlying orthostatic hypotension Monitor orthostatic vitals closely Stable off IV fluids   Severe hypokalemia- resolved with replacement Presented with potassium 2.2 likely secondary to aggressive diuretics. K 4.2 today --Check BMP,  Mg level at follow up   Acute hypoxic respiratory failure secondary to  COPD exacebation  8/8 - weaned off oxygen, on room air with O2 sat 90-92% 8/9 - O2 sats drop into mid 80's with exertion, qualifies for home O2 --Resume home inhaler regimen including Trelegy --DuoNebs PRN --Continue Prednisone 40 mg daily - to complete 5 days course  --Monitor O2 saturation closely, supplement O2 PRN, goal sats > 88%   Complex regional pain syndrome,  Acute on chronic pain due to ankle fracture Patient follows up with an outpatient pain physician Takes Norco, gabapentin as well as Lyrica -- current pain regimen includes oxycodone instead of Norco given ankle fracture Adjust regimen for adequate pain control Follow up at pain management   Chronic congestive heart failure with preserved ejection fraction,  Last echo was in January 2024 that showed ejection fraction 55 to 60% with diastolic function indeterminate. 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 Continue amitriptyline      Advance Care Planning:   Code Status: Full code       Consults: Podiatry, Vascular Surgery     Family Communication: sister present at bedside 8/7, 8/8, 8/9       Consultants: Podiatry, Vascular surgery Procedures performed: None  Disposition: Home health Diet recommendation:  Cardiac diet DISCHARGE MEDICATION: Allergies as of 02/04/2023        Reactions   Penicillins Other (See Comments)   Has patient had a PCN reaction causing immediate rash, facial/tongue/throat swelling, SOB or lightheadedness with hypotension: Yes Has patient had a PCN reaction causing severe rash involving mucus membranes or skin necrosis: Unknown Has patient had a PCN reaction that required hospitalization: Unknown Has patient had a PCN reaction occurring within the last 10 years: Unknown If all of the above answers are "NO", then may proceed with Cephalosporin use. Pt has taken augmentin before        Medication List     STOP taking these medications    furosemide 20 MG tablet Commonly known as: LASIX   HYDROcodone-acetaminophen 5-325 MG tablet Commonly known as: NORCO/VICODIN   metolazone 5 MG tablet Commonly known as: ZAROXOLYN   predniSONE 10 MG (21) Tbpk tablet Commonly known as: STERAPRED UNI-PAK 21 TAB Replaced by: predniSONE 20 MG tablet       TAKE these medications    acetaminophen 500 MG tablet Commonly known as: TYLENOL Take 2 tablets (1,000 mg total) by mouth every 8 (eight) hours.   albuterol 108 (90 Base) MCG/ACT inhaler Commonly known as: VENTOLIN HFA Inhale into the lungs every 6 (six) hours as needed for wheezing or shortness of breath.   amitriptyline 100 MG tablet Commonly known as: ELAVIL Take 100 mg by mouth at bedtime.   aspirin EC 81 MG tablet Take 1 tablet (  81 mg total) by mouth daily.   atorvastatin 40 MG tablet Commonly known as: LIPITOR Take 40 mg by mouth daily.   clopidogrel 75 MG tablet Commonly known as: PLAVIX TAKE 1 TABLET BY MOUTH DAILY AT 6AM What changed: See the new instructions.   cyanocobalamin 1000 MCG tablet Commonly known as: VITAMIN B12 Take 1,000 mcg by mouth daily.   gabapentin 300 MG capsule Commonly known as: NEURONTIN Take 2 capsules (600 mg total) by mouth at bedtime. What changed:  when to take this additional instructions   hydrOXYzine 25 MG tablet Commonly known  as: ATARAX TAKE ONE (1) TABLET BY MOUTH TWO TIMES PER DAY AS NEEDED FOR ITCHING   ipratropium-albuterol 0.5-2.5 (3) MG/3ML Soln Commonly known as: DUONEB Take 3 mLs by nebulization every 6 (six) hours as needed. What changed:  when to take this reasons to take this   Magnesium 400 MG Caps Take 1 tablet by mouth daily.   nitroGLYCERIN 0.4 MG SL tablet Commonly known as: NITROSTAT Place under the tongue.   oxyCODONE 5 MG immediate release tablet Commonly known as: Oxy IR/ROXICODONE Take 1-2 tablets (5-10 mg total) by mouth every 4 (four) hours as needed for severe pain or moderate pain.   predniSONE 20 MG tablet Commonly known as: DELTASONE Take 2 tablets (40 mg total) by mouth daily with breakfast for 2 days. Start taking on: February 05, 2023 Replaces: predniSONE 10 MG (21) Tbpk tablet   pregabalin 150 MG capsule Commonly known as: LYRICA Take 150 mg by mouth 3 (three) times daily.   senna-docusate 8.6-50 MG tablet Commonly known as: Senokot-S Take 2 tablets by mouth at bedtime.   tiZANidine 2 MG tablet Commonly known as: ZANAFLEX Take 1-2 tablets (2-4 mg total) by mouth at bedtime as needed for muscle spasms.   torsemide 20 MG tablet Commonly known as: DEMADEX Take 1 tablet (20 mg total) by mouth daily as needed (weight gain or swelling). What changed:  when to take this reasons to take this   Trelegy Ellipta 100-62.5-25 MCG/ACT Aepb Generic drug: Fluticasone-Umeclidin-Vilant Inhale 1 puff into the lungs daily.   Vitamin E 268 MG (400 UNIT) Caps Take 400 Units by mouth daily.               Durable Medical Equipment  (From admission, onward)           Start     Ordered   02/04/23 0946  For home use only DME oxygen  Once       Question Answer Comment  Length of Need 6 Months   Mode or (Route) Nasal cannula   Liters per Minute 2   Frequency Continuous (stationary and portable oxygen unit needed)   Oxygen delivery system Gas      02/04/23 0945    02/01/23 1201  For home use only DME lightweight manual wheelchair with seat cushion  Once       Comments: Patient suffers from acute fracture of the left ankle which impairs their ability to perform daily activities like bathing, moving, cooking, grooming in the home.  A walker will not resolve  issue with performing activities of daily living. A wheelchair will allow patient to safely perform daily activities. Patient is not able to propel themselves in the home using a standard weight wheelchair due to general weakness. Patient can self propel in the lightweight wheelchair. Length of need 6 months . Accessories: elevating leg rests (ELRs), wheel locks, extensions and anti-tippers.   02/01/23 1202  Discharge Exam: Filed Weights   01/31/23 1357  Weight: 63.5 kg   General exam: awake, alert, no acute distress HEENT: atraumatic, clear conjunctiva, anicteric sclera, moist mucus membranes, hearing grossly normal  Respiratory system: CTAB, no wheezes, rales or rhonchi, normal respiratory effort. Cardiovascular system: normal S1/S2, RRR, no JVD, murmurs, rubs, gallops, no pedal edema.   Gastrointestinal system: soft, NT, ND, no HSM felt, +bowel sounds. Central nervous system: A&O x 4. no gross focal neurologic deficits, normal speech Extremities: moves all, no edema, normal tone Skin: dry, intact, normal temperature, normal color, No rashes, lesions or ulcers Psychiatry: normal mood, congruent affect, judgement and insight appear normal   Condition at discharge: stable  The results of significant diagnostics from this hospitalization (including imaging, microbiology, ancillary and laboratory) are listed below for reference.   Imaging Studies: ECHOCARDIOGRAM COMPLETE  Result Date: 02/03/2023    ECHOCARDIOGRAM REPORT   Patient Name:   FATISHA PETITTE Date of Exam: 02/02/2023 Medical Rec #:  [id number]    Height:       60.0 in Accession #:    [id number]   Weight:       140.0 lb Date  of Birth:  Jan 01, 1951    BSA:          1.604 m Patient Age:    71 years     BP:           149/78 mmHg Patient Gender: F            HR:           87 bpm. Exam Location:  [facility] Procedure: 2D Echo, Cardiac Doppler and Color Doppler Indications:     CHF  History:         Patient has prior history of Echocardiogram examinations, most                  recent 07/13/2022. CHF, CAD and Previous Myocardial Infarction,                  COPD, Signs/Symptoms:Dyspnea; Risk Factors:Hypertension,                  Dyslipidemia and Current Smoker.  Sonographer:     Mikki Harbor Referring Phys:  [id number] LILY MICHELLE TANG Diagnosing Phys: Alwyn Pea MD  Sonographer Comments: Technically difficult study due to poor echo windows and suboptimal parasternal window. Image acquisition challenging due to respiratory motion. IMPRESSIONS  1. Left ventricular ejection fraction, by estimation, is 60 to 65%. The left ventricle has normal function. The left ventricle has no regional wall motion abnormalities. Left ventricular diastolic parameters are consistent with Grade I diastolic dysfunction (impaired relaxation).  2. Right ventricular systolic function is normal. The right ventricular size is normal. Mildly increased right ventricular wall thickness.  3. The mitral valve is normal in structure. Trivial mitral valve regurgitation.  4. The aortic valve is normal in structure. Aortic valve regurgitation is not visualized. FINDINGS  Left Ventricle: Left ventricular ejection fraction, by estimation, is 60 to 65%. The left ventricle has normal function. The left ventricle has no regional wall motion abnormalities. The left ventricular internal cavity size was normal in size. Suboptimal image quality limits for assessment of left ventricular hypertrophy. Left ventricular diastolic parameters are consistent with Grade I diastolic dysfunction (impaired relaxation). Right Ventricle: The right ventricular size is normal. Mildly increased right  ventricular wall thickness. Right ventricular systolic function is normal. Left Atrium: Left atrial size was normal in size. Right  Atrium: Right atrial size was normal in size. Pericardium: There is no evidence of pericardial effusion. Mitral Valve: The mitral valve is normal in structure. Trivial mitral valve regurgitation. MV peak gradient, 6.4 mmHg. The mean mitral valve gradient is 2.0 mmHg. Tricuspid Valve: The tricuspid valve is normal in structure. Tricuspid valve regurgitation is mild. Aortic Valve: The aortic valve is normal in structure. Aortic valve regurgitation is not visualized. Aortic valve mean gradient measures 3.0 mmHg. Aortic valve peak gradient measures 6.6 mmHg. Aortic valve area, by VTI measures 2.60 cm. Pulmonic Valve: The pulmonic valve was normal in structure. Pulmonic valve regurgitation is not visualized. Aorta: The ascending aorta was not well visualized. IAS/Shunts: No atrial level shunt detected by color flow Doppler.  LEFT VENTRICLE PLAX 2D LVIDd:         4.10 cm   Diastology LVIDs:         2.70 cm   LV e' medial:    10.70 cm/s LV PW:         0.90 cm   LV E/e' medial:  7.3 LV IVS:        1.10 cm   LV e' lateral:   12.00 cm/s LVOT diam:     2.00 cm   LV E/e' lateral: 6.5 LV SV:         68 LV SV Index:   42 LVOT Area:     3.14 cm  RIGHT VENTRICLE RV Basal diam:  3.70 cm RV Mid diam:    2.80 cm LEFT ATRIUM             Index LA diam:        3.70 cm 2.31 cm/m LA Vol (A2C):   33.9 ml 21.13 ml/m LA Vol (A4C):   32.9 ml 20.51 ml/m LA Biplane Vol: 33.4 ml 20.82 ml/m  AORTIC VALVE AV Area (Vmax):    2.75 cm AV Area (Vmean):   2.49 cm AV Area (VTI):     2.60 cm AV Vmax:           128.00 cm/s AV Vmean:          85.400 cm/s AV VTI:            0.261 m AV Peak Grad:      6.6 mmHg AV Mean Grad:      3.0 mmHg LVOT Vmax:         112.00 cm/s LVOT Vmean:        67.800 cm/s LVOT VTI:          0.216 m LVOT/AV VTI ratio: 0.83  AORTA Ao Root diam: 3.10 cm MITRAL VALVE MV Area (PHT): 3.27 cm      SHUNTS MV Area VTI:   2.21 cm     Systemic VTI:  0.22 m MV Peak grad:  6.4 mmHg     Systemic Diam: 2.00 cm MV Mean grad:  2.0 mmHg MV Vmax:       1.26 m/s MV Vmean:      70.5 cm/s MV Decel Time: 232 msec MV E velocity: 78.10 cm/s MV A velocity: 114.00 cm/s MV E/A ratio:  0.69 Dwayne D Callwood MD Electronically signed by Alwyn Pea MD Signature Date/Time: 02/03/2023/2:38:20 PM    Final    US ARTERIAL LOWER EXTREMITY DUPLEX LEFT (NON-ABI)  Result Date: 02/02/2023 CLINICAL DATA:  PVD, iliac stents, bilateral femoral endarterectomy EXAM: LEFT LOWER EXTREMITY ARTERIAL DUPLEX SCAN TECHNIQUE: Gray-scale sonography as well as color Doppler and duplex ultrasound was performed  to evaluate the lower extremity arteries including the common, superficial and profunda femoral arteries, popliteal artery and calf arteries. COMPARISON:  CT 01/05/2022 FINDINGS: Left lower Extremity ABI: Not calculated Inflow: Monophasic waveforms.  Normal color Doppler signal. Outflow: Monophasic waveforms throughout. Elevated peak systolic velocities in the mid SFA with some eccentric partially calcified plaque. Normal velocities and color signal in the visualized portions of deep femoral artery and popliteal artery. Runoff: Monophasic waveforms throughout. Nonvisualization of distal posterior tibial artery due to overlying splint. posterior and anterior tibial arterial waveforms and velocities. Vessels are patent to the ankle. IMPRESSION: 1. Monophasic waveforms throughout left lower extremity. 2. Elevated peak systolic velocities in the mid SFA, suggesting at least mild stenosis. 3. Nonvisualization of distal posterior tibial artery. Electronically Signed   By: Corlis Leak M.D.   On: 02/02/2023 09:45   DG Chest Portable 1 View  Result Date: 01/31/2023 CLINICAL DATA:  Syncope EXAM: PORTABLE CHEST 1 VIEW COMPARISON:  Chest radiograph dated 07/13/2022 FINDINGS: Normal lung volumes. No focal consolidations. No pleural effusion or  pneumothorax. The heart size and mediastinal contours are within normal limits. No radiographic finding of acute displaced fracture. IMPRESSION: 1. No radiographic finding of acute displaced fracture. 2.  No focal consolidations. Electronically Signed   By: Agustin Cree M.D.   On: 01/31/2023 16:20   DG Knee Complete 4 Views Left  Result Date: 01/31/2023 CLINICAL DATA:  fall EXAM: LEFT KNEE - COMPLETE 4+ VIEW; LEFT ANKLE COMPLETE - 3+ VIEW COMPARISON:  None Available. FINDINGS: There is diffuse osteopenia of the visualized osseous structures. Mildly displaced fractures of the lateral malleolus, medial malleolus and posterior malleolus noted with intra-articular extension. There is mild widening of the medial clear space. No other acute fracture or dislocation. No aggressive osseous lesion. Knee joint space is preserved on this nonweightbearing views. No significant osteophytosis. No knee effusion. No radiopaque foreign bodies. IMPRESSION: 1. Mildly displaced trimalleolar fracture with intra-articular extension. 2. Diffuse osteopenia. Electronically Signed   By: Jules Schick M.D.   On: 01/31/2023 15:24   DG Ankle Complete Left  Result Date: 01/31/2023 CLINICAL DATA:  fall EXAM: LEFT KNEE - COMPLETE 4+ VIEW; LEFT ANKLE COMPLETE - 3+ VIEW COMPARISON:  None Available. FINDINGS: There is diffuse osteopenia of the visualized osseous structures. Mildly displaced fractures of the lateral malleolus, medial malleolus and posterior malleolus noted with intra-articular extension. There is mild widening of the medial clear space. No other acute fracture or dislocation. No aggressive osseous lesion. Knee joint space is preserved on this nonweightbearing views. No significant osteophytosis. No knee effusion. No radiopaque foreign bodies. IMPRESSION: 1. Mildly displaced trimalleolar fracture with intra-articular extension. 2. Diffuse osteopenia. Electronically Signed   By: Jules Schick M.D.   On: 01/31/2023 15:24     Microbiology: Results for orders placed or performed during the hospital encounter of 07/13/22  Resp panel by RT-PCR (RSV, Flu A&B, Covid) Anterior Nasal Swab     Status: None   Collection Time: 07/13/22  2:55 PM   Specimen: Anterior Nasal Swab  Result Value Ref Range Status   SARS Coronavirus 2 by RT PCR NEGATIVE NEGATIVE Final    Comment: (NOTE) SARS-CoV-2 target nucleic acids are NOT DETECTED.  The SARS-CoV-2 RNA is generally detectable in upper respiratory specimens during the acute phase of infection. The lowest concentration of SARS-CoV-2 viral copies this assay can detect is 138 copies/mL. A negative result does not preclude SARS-Cov-2 infection and should not be used as the sole basis for treatment or  other patient management decisions. A negative result may occur with  improper specimen collection/handling, submission of specimen other than nasopharyngeal swab, presence of viral mutation(s) within the areas targeted by this assay, and inadequate number of viral copies(<138 copies/mL). A negative result must be combined with clinical observations, patient history, and epidemiological information. The expected result is Negative.  Fact Sheet for Patients:  BloggerCourse.com  Fact Sheet for Healthcare Providers:  SeriousBroker.it  This test is no t yet approved or cleared by the [place] FDA and  has been authorized for detection and/or diagnosis of SARS-CoV-2 by FDA under an Emergency Use Authorization (EUA). This EUA will remain  in effect (meaning this test can be used) for the duration of the COVID-19 declaration under Section 564(b)(1) of the Act, 21 U.S.C.section 360bbb-3(b)(1), unless the authorization is terminated  or revoked sooner.       Influenza A by PCR NEGATIVE NEGATIVE Final   Influenza B by PCR NEGATIVE NEGATIVE Final    Comment: (NOTE) The Xpert Xpress SARS-CoV-2/FLU/RSV plus assay is intended  as an aid in the diagnosis of influenza from Nasopharyngeal swab specimens and should not be used as a sole basis for treatment. Nasal washings and aspirates are unacceptable for Xpert Xpress SARS-CoV-2/FLU/RSV testing.  Fact Sheet for Patients: BloggerCourse.com  Fact Sheet for Healthcare Providers: SeriousBroker.it  This test is not yet approved or cleared by the [place] FDA and has been authorized for detection and/or diagnosis of SARS-CoV-2 by FDA under an Emergency Use Authorization (EUA). This EUA will remain in effect (meaning this test can be used) for the duration of the COVID-19 declaration under Section 564(b)(1) of the Act, 21 U.S.C. section 360bbb-3(b)(1), unless the authorization is terminated or revoked.     Resp Syncytial Virus by PCR NEGATIVE NEGATIVE Final    Comment: (NOTE) Fact Sheet for Patients: BloggerCourse.com  Fact Sheet for Healthcare Providers: SeriousBroker.it  This test is not yet approved or cleared by the [place] FDA and has been authorized for detection and/or diagnosis of SARS-CoV-2 by FDA under an Emergency Use Authorization (EUA). This EUA will remain in effect (meaning this test can be used) for the duration of the COVID-19 declaration under Section 564(b)(1) of the Act, 21 U.S.C. section 360bbb-3(b)(1), unless the authorization is terminated or revoked.  Performed at [facility], [address] [place], [place] [address]     Labs: CBC: Recent Labs  Lab 01/31/23 1405 02/01/23 0410 02/02/23 0445  WBC 9.1 8.2 11.1*  NEUTROABS  --  7.4 9.3*  HGB 14.6 13.8 12.4  HCT 44.6 42.9 38.2  MCV 86.3 88.5 88.2  PLT 203 216 182   Basic Metabolic Panel: Recent Labs  Lab 01/31/23 1405 01/31/23 2002 02/01/23 0410 02/02/23 0445 02/03/23 0356 02/04/23 0452  NA 138  --  135 141 142 141  K 2.9* 2.8* 4.4 3.5 3.9 4.2   CL 85*  --  92* 98 103 104  CO2 36*  --  30 34* 31 29  GLUCOSE 91  --  185* 154* 137* 160*  BUN 54*  --  56* 38* 30* 25*  CREATININE 1.54*  --  1.66* 1.07* 0.89 0.76  CALCIUM 10.2  --  9.1 8.8* 8.6* 8.1*  MG 2.2  --  2.4  --  2.3  --    Liver Function Tests: No results for input(s): "AST", "ALT", "ALKPHOS", "BILITOT", "PROT", "ALBUMIN" in the last 168 hours. CBG: Recent Labs  Lab 02/02/23 0737  GLUCAP 114*  Discharge time spent: greater than 30 minutes.  Signed: Pennie Banter, DO [facility] 02/04/2023

## 2023-02-04 NOTE — TOC Progression Note (Signed)
Transition of Care [facility]) - Progression Note    Patient Details  Name: Wendy Arias MRN: [id number] Date of Birth: Jan 20, 1951  [facility]) CM/SW Contact  Marlowe Sax, RN Phone Number: 02/04/2023, 2:15 PM  Clinical Narrative:     The patient had requested a BSC, She stated that she had her sons at home and could not find it, I reached out to [employer] to get one for her and they stated due to having a Wheelchair that ins would not cover, she stated that they found her son's 3 in 1 at home and they no longer needed, [employer] is bringing her O2 and WC with eloevated left raise to the bedside  Expected Discharge Plan: Home w Home Health Services Barriers to Discharge: Continued Medical Work up  Expected Discharge Plan and Services   Discharge Planning Services: CM Consult   Living arrangements for the past 2 months: Single Family Home Expected Discharge Date: 02/04/23               DME Arranged: Wheelchair manual DME Agency: [employer] Date DME Agency Contacted: 01/25/23 Time DME Agency Contacted: [phone] Representative spoke with at DME Agency: [facility] HH Arranged: PT, OT HH Agency: [facility] Date HH Agency Contacted: 02/01/23 Time HH Agency Contacted: [phone] Representative spoke with at [facility] Agency: Adelina Mings   Social Determinants of Health (SDOH) Interventions SDOH Screenings   Food Insecurity: No Food Insecurity (01/31/2023)  Housing: Low Risk  (01/31/2023)  Transportation Needs: No Transportation Needs (01/31/2023)  Utilities: Not At Risk (01/31/2023)  Depression (PHQ2-9): Low Risk  (01/27/2023)  Financial Resource Strain: High Risk (02/16/2018)  Physical Activity: Inactive (02/16/2018)  Social Connections: Unknown (02/16/2018)  Stress: Stress Concern Present (02/16/2018)  Tobacco Use: High Risk (01/31/2023)    Readmission Risk Interventions    07/14/2022   11:37 AM  Readmission Risk Prevention Plan  Transportation Screening Complete  PCP or Specialist Appt within 5-7 Days  Complete  Home Care Screening Complete  Medication Review (RN CM) Complete

## 2023-02-04 NOTE — TOC Progression Note (Signed)
Transition of Care [facility]) - Progression Note    Patient Details  Name: Wendy Arias MRN: [id number] Date of Birth: 05-Nov-1950  Transition of Care [facility]) CM/SW Contact  Marlowe Sax, RN Phone Number: 02/04/2023, 12:32 PM  Clinical Narrative:    [employer] is going to switch out the wheelchair to a light weight with elevated leg rest and take the reclinig back wheelchair back, they will also provide oxygen   Expected Discharge Plan: Home w Home Health Services Barriers to Discharge: Continued Medical Work up  Expected Discharge Plan and Services   Discharge Planning Services: CM Consult   Living arrangements for the past 2 months: Single Family Home Expected Discharge Date: 02/04/23               DME Arranged: Wheelchair manual DME Agency: [employer] Date DME Agency Contacted: 01/25/23 Time DME Agency Contacted: [phone] Representative spoke with at DME Agency: [facility] HH Arranged: PT, OT HH Agency: [facility] Date [facility] Agency Contacted: 02/01/23 Time HH Agency Contacted: [phone] Representative spoke with at [facility] Agency: Adelina Mings   Social Determinants of Health (SDOH) Interventions SDOH Screenings   Food Insecurity: No Food Insecurity (01/31/2023)  Housing: Low Risk  (01/31/2023)  Transportation Needs: No Transportation Needs (01/31/2023)  Utilities: Not At Risk (01/31/2023)  Depression (PHQ2-9): Low Risk  (01/27/2023)  Financial Resource Strain: High Risk (02/16/2018)  Physical Activity: Inactive (02/16/2018)  Social Connections: Unknown (02/16/2018)  Stress: Stress Concern Present (02/16/2018)  Tobacco Use: High Risk (01/31/2023)    Readmission Risk Interventions    07/14/2022   11:37 AM  Readmission Risk Prevention Plan  Transportation Screening Complete  PCP or Specialist Appt within 5-7 Days Complete  Home Care Screening Complete  Medication Review (RN CM) Complete

## 2023-02-04 NOTE — Progress Notes (Signed)
Patient is not able to walk the distance required to go the bathroom, or he/she is unable to safely negotiate stairs required to access the bathroom.  A 3in1 BSC will alleviate this problem  

## 2023-02-04 NOTE — Progress Notes (Signed)
Oxygen Stats taken while patient was ambulating.   Before Ambulating: 92% on Room Air During Ambulation: 85% on Room Air After Ambulation with 2 liters Oxygen via [place]: 93%

## 2023-02-04 NOTE — Consult Note (Signed)
[facility] Customer service manager [facility]) Accountable Care Organization (ACO) [facility] Liaison Note  02/04/2023  Wendy Arias 11-22-1950 [id number]  Location: [facility] RN Hospital Liaison screened the patient remotely at [facility].  Insurance: [employer]   Wendy Arias is a 72 y.o. female who is a Primary Care Patient of Harlon Flor, Jonnie Finner, PA-C [facility]). The patient was screened for readmission hospitalization with noted high risk score for unplanned readmission risk with 1 IP in 6 months.  The patient was assessed for potential [facility]) Care Management service needs for post hospital transition for care coordination. Review of patient's electronic medical record reveals patient was admitted Closed fracture left ankle. Liaison spoke with pt today concerning care management services post discharge prevention readmission follow up call. 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 Verified [facility] and the arrival of her home O2 to the bedside prior to her discharged today.  Pt indicates all other medical conditions are managed at this time. No anticipated needs.   [facility] Care Management/Population Health does not replace or interfere with any arrangements made by the Inpatient Transition of Care team.   For questions contact:   Elliot Cousin, RN, [facility] Liaison [place]   Population Health Office Hours MTWF  8:00 am-6:00 pm Off on Thursday [phone] mobile [phone] [Office toll free line] Office Hours are M-F 8:30 - 5 pm [email or web address]

## 2023-02-04 NOTE — TOC Progression Note (Signed)
Transition of Care [facility]) - Progression Note    Patient Details  Name: Wendy Arias MRN: [id number] Date of Birth: [birth year]  Transition [facility] Care [facility]) CM/SW Contact  Marlowe Sax, RN Phone Number: 02/04/2023, 11:53 AM  Clinical Narrative:    Requested Oxygen from [employer] to be delivered to the bedside and home set up to the home   Expected Discharge Plan: Home w Home Health Services Barriers to Discharge: Continued Medical Work up  Expected Discharge Plan and Services   Discharge Planning Services: CM Consult   Living arrangements for the past 2 months: Single Family Home Expected Discharge Date: 02/04/23               DME Arranged: Wheelchair manual DME Agency: [employer] Date DME Agency Contacted: 01/25/23 Time DME Agency Contacted: [phone] Representative spoke with at DME Agency: [facility] HH Arranged: PT, OT HH Agency: [facility] Date [facility] Agency Contacted: 02/01/23 Time HH Agency Contacted: [phone] Representative spoke with at [facility] Agency: Adelina Mings   Social Determinants of Health (SDOH) Interventions SDOH Screenings   Food Insecurity: No Food Insecurity (01/31/2023)  Housing: Low Risk  (01/31/2023)  Transportation Needs: No Transportation Needs (01/31/2023)  Utilities: Not At Risk (01/31/2023)  Depression (PHQ2-9): Low Risk  (01/27/2023)  Financial Resource Strain: High Risk (02/16/2018)  Physical Activity: Inactive (02/16/2018)  Social Connections: Unknown (02/16/2018)  Stress: Stress Concern Present (02/16/2018)  Tobacco Use: High Risk (01/31/2023)    Readmission Risk Interventions    07/14/2022   11:37 AM  Readmission Risk Prevention Plan  Transportation Screening Complete  PCP or Specialist Appt within 5-7 Days Complete  Home Care Screening Complete  Medication Review (RN CM) Complete

## 2023-02-04 NOTE — Care Management Important Message (Signed)
Important Message  Patient Details  Name: FEIGE KRUTZ MRN: [id number] Date of Birth: 1950/10/19   Medicare Important Message Given:  Yes     Olegario Messier A  02/04/2023, 10:46 AM

## 2023-02-04 NOTE — Progress Notes (Addendum)
Physical Therapy Treatment Patient Details Name: Wendy Arias MRN: [id number] DOB: 1950-11-12 Today's Date: 02/04/2023   History of Present Illness Pt admitted for L LE ankle fx. Pt elected conservative management at this time. History includes COPD, cardiomyopathy and CRPS.    PT Comments  Pt progressing towards PT goals. Pt received seated at EOB, she is agreeable to PT session. Pt performs bed mobility independently, transfers and w/c propulsion with supervision A for safety. Cuing required for hand placement during transfer to w/c and w/c propulsion but otherwise independent. Pt able to w/c propulsion 30 ft using BUE and RLE for propulsion forward and backwards. Vitals assessed following activity and were WNL. Educated and demonstrated Pt and sister on w/c part management-Pt and sister verbalized understanding. Pt returned to bed with O2 at end of session. Pt would cont to benefit from skilled PT to address above deficits and promote optimal return to PLOF.   If plan is discharge home, recommend the following: A little help with walking and/or transfers;A lot of help with bathing/dressing/bathroom;Assist for transportation;Help with stairs or ramp for entrance;Supervision due to cognitive status   Can travel by private vehicle     No  Equipment Recommendations  Wheelchair (measurements PT);BSC/3in1    Recommendations for Other Services       Precautions / Restrictions Precautions Precautions: Fall Required Braces or Orthoses: Splint/Cast Splint/Cast: LLE spint/cast Splint/Cast - Date Prophylactic Dressing Applied (if applicable): 01/31/23 Restrictions Weight Bearing Restrictions: Yes LLE Weight Bearing: Non weight bearing     Mobility  Bed Mobility               General bed mobility comments: NT due to Pt seated at EOB at beginning of session    Transfers Overall transfer level: Needs assistance Equipment used: Pushed w/c Transfers: Sit to/from Stand, Bed to  chair/wheelchair/BSC Sit to Stand: Supervision Stand pivot transfers: Supervision        Lateral/Scoot Transfers: Supervision General transfer comment: Demonstrated how to scoot and transfer to w/c safely; Required min cuing for lat scooting and maintaining NWB precautions    Ambulation/Gait               General Gait Details: unable to perform   Psychologist, counselling mobility: Yes Wheelchair propulsion: Both upper extremities, Right lower extremity Wheelchair parts: Supervision/cueing Distance: 30 Wheelchair Assistance Details (indicate cue type and reason): Cuing for use of RLE to assistance with propulsion   Tilt Bed    Modified Rankin (Stroke Patients Only)       Balance Overall balance assessment: Needs assistance Sitting-balance support: Feet supported, No upper extremity supported Sitting balance-Leahy Scale: Good Sitting balance - Comments: Able to maintain dynamic seated balance during functional activity       Standing balance comment: NT due to NWB precautions                            Cognition Arousal: Alert Behavior During Therapy: WFL for tasks assessed/performed Overall Cognitive Status: Within Functional Limits for tasks assessed                                 General Comments: Motivated and pleasant today        Exercises      General Comments  Pertinent Vitals/Pain Pain Assessment Pain Assessment: Faces Faces Pain Scale: Hurts a little bit Pain Location: L ankle Pain Descriptors / Indicators: Aching, Grimacing, Guarding    Home Living                          Prior Function            PT Goals (current goals can now be found in the care plan section) Acute Rehab PT Goals Patient Stated Goal: to go home PT Goal Formulation: With patient Time For Goal Achievement: 02/15/23 Potential to Achieve Goals: Good Progress  towards PT goals: Progressing toward goals    Frequency    Min 1X/week      PT Plan      Co-evaluation              AM-PAC PT "6 Clicks" Mobility   Outcome Measure  Help needed turning from your back to your side while in a flat bed without using bedrails?: A Little Help needed moving from lying on your back to sitting on the side of a flat bed without using bedrails?: A Little Help needed moving to and from a bed to a chair (including a wheelchair)?: A Little Help needed standing up from a chair using your arms (e.g., wheelchair or bedside chair)?: Total Help needed to walk in hospital room?: Total Help needed climbing 3-5 steps with a railing? : Total 6 Click Score: 12    End of Session Equipment Utilized During Treatment: Oxygen Activity Tolerance: Patient limited by pain Patient left: in bed;with bed alarm set;with call bell/phone within reach;with family/visitor present Nurse Communication: Mobility status PT Visit Diagnosis: Unsteadiness on feet (R26.81);Muscle weakness (generalized) (M62.81);History of falling (Z91.81);Difficulty in walking, not elsewhere classified (R26.2);Pain Pain - Right/Left: Left Pain - part of body: Ankle and joints of foot     Time: 1610-9604 PT Time Calculation (min) (ACUTE ONLY): 22 min  Charges:                            Elmon Else, SPT      02/04/2023, 12:02 PM

## 2023-02-07 DIAGNOSIS — J449 Chronic obstructive pulmonary disease, unspecified: Secondary | ICD-10-CM | POA: Diagnosis not present

## 2023-02-08 ENCOUNTER — Encounter: Payer: Self-pay | Admitting: Student in an Organized Health Care Education/Training Program

## 2023-02-08 ENCOUNTER — Ambulatory Visit
Payer: HMO | Attending: Student in an Organized Health Care Education/Training Program | Admitting: Student in an Organized Health Care Education/Training Program

## 2023-02-08 VITALS — BP 151/74 | HR 73 | Temp 98.1°F | Resp 16 | Ht 60.0 in | Wt 140.0 lb

## 2023-02-08 DIAGNOSIS — G894 Chronic pain syndrome: Secondary | ICD-10-CM | POA: Diagnosis not present

## 2023-02-08 DIAGNOSIS — J449 Chronic obstructive pulmonary disease, unspecified: Secondary | ICD-10-CM | POA: Diagnosis not present

## 2023-02-08 DIAGNOSIS — I739 Peripheral vascular disease, unspecified: Secondary | ICD-10-CM | POA: Insufficient documentation

## 2023-02-08 DIAGNOSIS — M792 Neuralgia and neuritis, unspecified: Secondary | ICD-10-CM | POA: Diagnosis not present

## 2023-02-08 DIAGNOSIS — G5773 Causalgia of bilateral lower limbs: Secondary | ICD-10-CM

## 2023-02-08 DIAGNOSIS — G5771 Causalgia of right lower limb: Secondary | ICD-10-CM | POA: Insufficient documentation

## 2023-02-08 DIAGNOSIS — R0602 Shortness of breath: Secondary | ICD-10-CM | POA: Diagnosis not present

## 2023-02-08 DIAGNOSIS — G5772 Causalgia of left lower limb: Secondary | ICD-10-CM | POA: Insufficient documentation

## 2023-02-08 MED ORDER — BUPRENORPHINE 7.5 MCG/HR TD PTWK: 1.0000 | MEDICATED_PATCH | TRANSDERMAL | 0 refills | Status: DC

## 2023-02-08 MED ORDER — BUPRENORPHINE 5 MCG/HR TD PTWK: 1.0000 | MEDICATED_PATCH | TRANSDERMAL | 0 refills | Status: DC

## 2023-02-08 NOTE — Progress Notes (Signed)
PROVIDER NOTE: Information contained herein reflects review and annotations entered in association with encounter. Interpretation of such information and data should be left to medically-trained personnel. Information provided to patient can be located elsewhere in the medical record under "Patient Instructions". Document created using STT-dictation technology, any transcriptional errors that may result from process are unintentional.    Patient: Stephanie Acre  Service Category: E/M  Provider: Edward Jolly, MD  DOB: 11/10/1950  DOS: 02/08/2023  Referring Provider: Wilford Corner  MRN: [id number]  Specialty: Interventional Pain Management  PCP: Wilford Corner, PA-C  Type: Established Patient  Setting: Ambulatory outpatient    Location: Office  Delivery: Face-to-face     Primary Reason(s) for Visit: Encounter for evaluation before starting new chronic pain management plan of care (Level of risk: moderate) CC: Leg Pain (bilat)  HPI  Ms. Sypher is a 72 y.o. year old, female patient, who comes today for a follow-up evaluation to review the test results and decide on a treatment plan. 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; Coronary artery disease involving native coronary artery of native heart without angina pectoris; Dysphagia; Dyspnea on exertion; Elevated serum alkaline phosphatase level; Essential hypertension; Gastroesophageal reflux  disease without esophagitis; History of empyema of pleura; IDA (iron deficiency anemia); Infection by Streptococcus, viridans group; Chronic insomnia; Ischemic cardiomyopathy; Itching; Mitral regurgitation; Other nonspecific abnormal finding of lung field; Chronic cough; Postmenopausal; Pure hypercholesterolemia; Atherosclerosis of native arteries of extremity with rest pain (HCC); Atherosclerosis of artery of extremity with rest pain (HCC); Acute on chronic diastolic CHF (congestive heart failure) (HCC); Acute metabolic encephalopathy; HLD (hyperlipidemia); CAD (coronary artery disease); Depression with anxiety; Tobacco abuse; Acute respiratory failure with hypoxia (HCC); Altered mental status; Peripheral vascular disease (HCC) (hx of femoral endarterectomy September 17 2021); Neuropathic pain of lower extremity; and Closed left ankle fracture on their problem list. Her primarily concern today is the Leg Pain (bilat)  Pain Assessment: Location: Right, Left Leg Radiating: inside thighs bilat to knees Onset: More than a month ago Duration: Chronic pain Quality: Burning Severity: 9 /10 (subjective, self-reported pain score)  Effect on ADL: limits adls Timing: Constant Modifying factors: meds BP: (!) 151/74  HR: 73  Ms. Preece comes in today for a follow-up visit after her initial evaluation on 01/27/2023. Today we went over the results of her tests. These were explained in "Layman's terms". During today's appointment we went over my diagnostic impression, as well as the proposed treatment plan.  Since the patient's initial clinic visit with the me she unfortunately went to the emergency department and was admitted to the hospital for 5 days after experiencing a near syncopal episode while she was standing up to use the bathroom and experienced falling down and landing on her buttocks.  She felt left ankle pain with her fall that was 10 out of 10 pain and upon x-rays at the emergency department a left ankle  trimalleolar fracture was diagnosed.  She is meeting with orthopedic specialist later this week.  HPI initial clinic visit:  [name] is a pleasant 72 year old female who presents with a chief complaint of bilateral groin pain that radiates along her medial thigh to her knees. She has a history of femoral endarterectomy that was done in March 2023. She states that her burning pain started then. She endorses swelling, temperature changes states that it is cool to touch as well as allodynia of her groin and medial thigh. She currently sees neurology and is managed on gabapentin and Lyrica. She also takes hydrocodone 5 to 10 mg nightly that is prescribed by her primary care provider. She states that she has seen multiple specialists and has not had much success with pain relief for pain management. Previous diagnosis of complex regional pain syndrome was made and I agree with this diagnosis. She does meet [place] criteria for CRPS colluding allodynia, pain, edema, color changes and temperature changes. She is currently on Plavix as well for peripheral vascular disease. She endorses muscle tightness in her groins in the evening.   Patient presented with interventional treatment options. Ms. Parrent was informed that I will not be providing medication management. Pharmacotherapy evaluation including recommendations may be offered, if specifically requested.   Controlled Substance Pharmacotherapy Assessment REMS (Risk Evaluation and Mitigation Strategy)   Pill Count: None expected due to no prior prescriptions written by our practice. No notes on file Pharmacokinetics: Liberation and absorption (onset of action): WNL Distribution (time to peak effect): WNL Metabolism and excretion (duration of action): WNL         Pharmacodynamics: Desired effects: Analgesia: Ms. Cuadras reports >50% benefit. Functional ability: Patient reports that medication allows her to accomplish basic ADLs Clinically meaningful  improvement in function (CMIF): Sustained CMIF goals met Perceived effectiveness: Described as relatively effective, allowing for increase in activities of daily living (ADL) Undesirable effects: Side-effects or Adverse reactions: None reported Monitoring: [place] PMP: PDMP not reviewed this encounter. Online review of the past 13-month period previously conducted. Not applicable at this point since we have not taken over the patient's medication management yet. List of other Serum/Urine Drug Screening Test(s):  Lab Results  Component Value Date   COCAINSCRNUR NONE DETECTED 11/03/2017   THCU NONE DETECTED 11/03/2017   ETH <10 11/02/2017   List of all UDS test(s) done:  Lab Results  Component Value Date   SUMMARY Note 01/27/2023   Last UDS on record: Summary  Date Value Ref Range Status  01/27/2023 Note  Final    Comment:    ==================================================================== Compliance Drug Analysis, Ur ==================================================================== Test                             Result       Flag       Units  Drug Present and Declared for Prescription Verification   Gabapentin                     PRESENT      EXPECTED   Pregabalin                     PRESENT      EXPECTED   Amitriptyline                  PRESENT      EXPECTED   Nortriptyline                  PRESENT      EXPECTED    Nortriptyline is an  expected metabolite of amitriptyline.    Acetaminophen                  PRESENT      EXPECTED  Drug Present not Declared for Prescription Verification   Diphenhydramine                PRESENT      UNEXPECTED   Dextromethorphan               PRESENT      UNEXPECTED   Dextrorphan/Levorphanol        PRESENT      UNEXPECTED    Dextrorphan is an expected metabolite of dextromethorphan, an over-    the-counter or prescription cough suppressant. Dextrorphan cannot be    distinguished from the scheduled prescription medication levorphanol    by  the method used for analysis.    Guaifenesin                    PRESENT      UNEXPECTED    Guaifenesin may be administered as an over-the-counter or    prescription drug; it may also be present as a breakdown product of    methocarbamol.  Drug Absent but Declared for Prescription Verification   Hydrocodone                    Not Detected UNEXPECTED ng/mg creat   Tizanidine                     Not Detected UNEXPECTED    Tizanidine, as indicated in the declared medication list, is not    always detected even when used as directed.    Salicylate                     Not Detected UNEXPECTED    Aspirin, as indicated in the declared medication list, is not always    detected even when used as directed.    Hydroxyzine                    Not Detected UNEXPECTED ==================================================================== Test                      Result    Flag   Units      Ref Range   Creatinine              50               mg/dL      >=62 ==================================================================== Declared Medications:  The flagging and interpretation on this report are based on the  following declared medications.  Unexpected results may arise from  inaccuracies in the declared medications.   **Note: The testing scope of this panel includes these medications:   Amitriptyline (Elavil)  Gabapentin (Neurontin)  Hydrocodone (Norco)  Hydroxyzine (Atarax)  Pregabalin (Lyrica)   **Note: The testing scope of this panel does not include small to  moderate amounts of these reported medications:   Acetaminophen (Norco)  Aspirin  Tizanidine (Zanaflex)   **Note: The testing scope of this panel does not include the  following reported medications:   Albuterol (Ventolin HFA)  Albuterol (Duoneb)  Atorvastatin (Lipitor)  Clopidogrel (Plavix)  Fluticasone (Trelegy)  Furosemide (Lasix)  Ipratropium (Duoneb)  Magnesium  Metolazone (Zaroxolyn)  Nitroglycerin (Nitrostat)   Prednisone  Torsemide (Demadex)  Umeclidinium (Trelegy)  Vilanterol (Trelegy)  Vitamin B12  Vitamin E ====================================================================  For clinical consultation, please call [phone]. ====================================================================    UDS interpretation: No unexpected findings.          Medication Assessment Form: Not applicable. No opioids. Treatment compliance: Not applicable Risk Assessment Profile: Aberrant behavior: See initial evaluations. None observed or detected today Comorbid factors increasing risk of overdose: See initial evaluation. No additional risks detected today Opioid risk tool (ORT):     01/27/2023    9:46 AM  Opioid Risk   Alcohol 1  Illegal Drugs 0  Rx Drugs 0  Alcohol 0  Illegal Drugs 0  Rx Drugs 0  Age between 16-45 years  0  History of Preadolescent Sexual Abuse 0  Depression 0  Opioid Risk Tool Scoring 1  Opioid Risk Interpretation Low Risk    ORT Scoring interpretation table:  Score <3 = Low Risk for SUD  Score between 4-7 = Moderate Risk for SUD  Score >8 = High Risk for Opioid Abuse   Risk of substance use disorder (SUD): Low  Risk Mitigation Strategies:  Patient opioid safety counseling: Completed today. Counseling provided to patient as per "Patient Counseling Document". Document signed by patient, attesting to counseling and understanding Patient-Prescriber Agreement (PPA): Obtained today.  Controlled substance notification to other providers: Written and sent today.  Pharmacologic Plan: Today we may be taking over the patient's pharmacological regimen. See below.             Laboratory Chemistry Profile   Renal Lab Results  Component Value Date   BUN 25 (H) 02/04/2023   CREATININE 0.76 02/04/2023   GFRAA >60 04/13/2018   GFRNONAA >60 02/04/2023   PROTEINUR 100 (A) 04/13/2018     Electrolytes Lab Results  Component Value Date   NA 141 02/04/2023   K 4.2  02/04/2023   CL 104 02/04/2023   CALCIUM 8.1 (L) 02/04/2023   MG 2.3 02/03/2023     Hepatic Lab Results  Component Value Date   AST 15 07/13/2022   ALT 15 07/13/2022   ALBUMIN 3.7 07/13/2022   ALKPHOS 106 07/13/2022   LIPASE 23 02/16/2018   AMMONIA 36 (H) 07/13/2022     ID Lab Results  Component Value Date   HIV Non Reactive 05/26/2021   SARSCOV2NAA NEGATIVE 07/13/2022   MRSAPCR NEGATIVE 04/10/2018     Bone Lab Results  Component Value Date   VD25OH 15.5 (L) 08/12/2015     Endocrine Lab Results  Component Value Date   GLUCOSE 160 (H) 02/04/2023   GLUCOSEU NEGATIVE 04/13/2018   TSH 2.470 08/12/2015     Neuropathy Lab Results  Component Value Date   VITAMINB12 2,626 (H) 04/13/2018   FOLATE 4.9 (L) 04/13/2018   HIV Non Reactive 05/26/2021     CNS No results found for: "COLORCSF", "APPEARCSF", "RBCCOUNTCSF", "WBCCSF", "POLYSCSF", "LYMPHSCSF", "EOSCSF", "PROTEINCSF", "GLUCCSF", "JCVIRUS", "CSFOLI", "IGGCSF", "LABACHR", "ACETBL"   Inflammation (CRP: Acute  ESR: Chronic) Lab Results  Component Value Date   LATICACIDVEN 1.1 07/13/2022     Rheumatology Lab Results  Component Value Date   RF 22.5 (H) 03/10/2018     Coagulation Lab Results  Component Value Date   INR 1.1 07/13/2022   LABPROT 14.0 07/13/2022   APTT 43 (H) 07/13/2022   PLT 182 02/02/2023     Cardiovascular Lab Results  Component Value Date   BNP 55.3 01/31/2023   CKTOTAL 585 (H) 11/02/2017   TROPONINI 0.05 (HH) 04/09/2018   HGB 12.4 02/02/2023   HCT 38.2 02/02/2023     Screening Lab  Results  Component Value Date   SARSCOV2NAA NEGATIVE 07/13/2022   MRSAPCR NEGATIVE 04/10/2018   HIV Non Reactive 05/26/2021     Cancer No results found for: "CEA", "CA125", "LABCA2"   Allergens No results found for: "ALMOND", "APPLE", "ASPARAGUS", "AVOCADO", "BANANA", "BARLEY", "BASIL", "BAYLEAF", "GREENBEAN", "LIMABEAN", "WHITEBEAN", "BEEFIGE", "REDBEET", "BLUEBERRY", "BROCCOLI", "CABBAGE",  "MELON", "CARROT", "CASEIN", "CASHEWNUT", "CAULIFLOWER", "CELERY"     Note: Lab results reviewed.    MR LUMBAR SPINE WO CONTRAST  Narrative CLINICAL DATA:  Bilateral leg pain and weakness.  EXAM: MRI LUMBAR SPINE WITHOUT CONTRAST  TECHNIQUE: Multiplanar, multisequence MR imaging of the lumbar spine was performed. No intravenous contrast was administered.  COMPARISON:  CT scan 04/11/2018  FINDINGS: Segmentation: There are five lumbar type vertebral bodies. The last full intervertebral disc space is labeled L5-S1.  Alignment: Normal overall alignment of the lumbar vertebral bodies. There is mild degenerative anterolisthesis of L4.  Vertebrae: Remote compression fracture of L1 with stable retropulsion. No new/acute fractures. No worrisome bone lesions.  Conus medullaris and cauda equina: Conus extends to the L1 level. Conus and cauda equina appear normal.  Paraspinal and other soft tissues: 2 small cystic appearing areas near the left iliopsoas muscle. I do not see these for certain on the prior CT scan. The larger lesion measures 2.4 x 2.3 cm in the smaller more posterior lesion measures 10 mm  Infrarenal abdominal aortic aneurysm measuring 3 cm is noted. Recommend follow-up ultrasound every 3 years. This recommendation follows ACR consensus guidelines: White Paper of the ACR Incidental Findings Committee II on Vascular Findings. J Am Coll Radiol 2013; 10:789-794.  Disc levels:  T12-L1: Mild retropulsion involving the posterosuperior aspect of L1 with flattening of the ventral thecal sac but the spinal canal is generous in is no significant spinal stenosis.  L1-2: Mild annular bulge but no focal disc protrusion, spinal or foraminal stenosis.  L2-3: Mild facet disease but no disc protrusions, spinal or foraminal stenosis.  L3-4: Mild facet disease and mild annular bulge but no spinal or foraminal stenosis.  L4-5 mild bulging uncovered disc and moderate facet  disease contributing to early spinal and mild bilateral lateral recess stenosis. No foraminal stenosis.  L5-S1: Mild to moderate facet disease but no disc protrusions, spinal or foraminal stenosis.  IMPRESSION: 1. Remote compression fracture of L1 with stable retropulsion involving the posterosuperior aspect of L1 with flattening of the ventral thecal sac but no significant spinal stenosis. 2. No new/acute fractures. 3. Bulging uncovered disc and moderate facet disease at L4-5 contributing to early spinal and mild bilateral lateral recess stenosis. 4. 3 cm infrarenal abdominal aortic aneurysm. 5. Two cystic lesions near the left psoas muscle of uncertain significance or etiology. I would recommend a follow-up CT scan of the abdomen and pelvis with contrast for further evaluation.   Electronically Signed By: Rudie Meyer M.D. On: 12/20/2021 18:03   DG Knee Complete 4 Views Left  Narrative CLINICAL DATA:  fall  EXAM: LEFT KNEE - COMPLETE 4+ VIEW; LEFT ANKLE COMPLETE - 3+ VIEW  COMPARISON:  None Available.  FINDINGS: There is diffuse osteopenia of the visualized osseous structures.  Mildly displaced fractures of the lateral malleolus, medial malleolus and posterior malleolus noted with intra-articular extension. There is mild widening of the medial clear space.  No other acute fracture or dislocation.  No aggressive osseous lesion.  Knee joint space is preserved on this nonweightbearing views. No significant osteophytosis.  No knee effusion.  No radiopaque foreign bodies.  IMPRESSION: 1. Mildly displaced  trimalleolar fracture with intra-articular extension. 2. Diffuse osteopenia.   Electronically Signed By: Jules Schick M.D. On: 01/31/2023 15:24   Ankle Imaging: Ankle-R DG Complete: No results found for this or any previous visit.  Ankle-L DG Complete: Results for orders placed during the hospital encounter of 01/31/23  DG Ankle Complete  Left  Narrative CLINICAL DATA:  fall  EXAM: LEFT KNEE - COMPLETE 4+ VIEW; LEFT ANKLE COMPLETE - 3+ VIEW  COMPARISON:  None Available.  FINDINGS: There is diffuse osteopenia of the visualized osseous structures.  Mildly displaced fractures of the lateral malleolus, medial malleolus and posterior malleolus noted with intra-articular extension. There is mild widening of the medial clear space.  No other acute fracture or dislocation.  No aggressive osseous lesion.  Knee joint space is preserved on this nonweightbearing views. No significant osteophytosis.  No knee effusion.  No radiopaque foreign bodies.  IMPRESSION: 1. Mildly displaced trimalleolar fracture with intra-articular extension. 2. Diffuse osteopenia.   Electronically Signed By: Jules Schick M.D. On: 01/31/2023 15:24   Complexity Note: Imaging results reviewed.                         Meds   Current Outpatient Medications:    acetaminophen (TYLENOL) 500 MG tablet, Take 2 tablets (1,000 mg total) by mouth every 8 (eight) hours., Disp: , Rfl:    albuterol (PROVENTIL HFA;VENTOLIN HFA) 108 (90 Base) MCG/ACT inhaler, Inhale into the lungs every 6 (six) hours as needed for wheezing or shortness of breath., Disp: , Rfl:    amitriptyline (ELAVIL) 100 MG tablet, Take 100 mg by mouth at bedtime., Disp: , Rfl:    aspirin EC 81 MG tablet, Take 1 tablet (81 mg total) by mouth daily., Disp: 150 tablet, Rfl: 2   atorvastatin (LIPITOR) 40 MG tablet, Take 40 mg by mouth daily., Disp: , Rfl:    buprenorphine (BUTRANS) 5 MCG/HR PTWK, Place 1 patch onto the skin once a week for 28 days., Disp: 4 patch, Rfl: 0   buprenorphine (BUTRANS) 7.5 MCG/HR, Place 1 patch onto the skin once a week for 28 days., Disp: 4 patch, Rfl: 0   clopidogrel (PLAVIX) 75 MG tablet, TAKE 1 TABLET BY MOUTH DAILY AT 6AM (Patient taking differently: Take 75 mg by mouth daily.), Disp: 30 tablet, Rfl: 6   gabapentin (NEURONTIN) 300 MG capsule, Take 2  capsules (600 mg total) by mouth at bedtime. (Patient taking differently: Take 600 mg by mouth 2 (two) times daily. One capsule in the morning, 2 capsules at bedtime), Disp: 60 capsule, Rfl: 3   hydrOXYzine (ATARAX) 25 MG tablet, TAKE ONE (1) TABLET BY MOUTH TWO TIMES PER DAY AS NEEDED FOR ITCHING, Disp: , Rfl:    ipratropium-albuterol (DUONEB) 0.5-2.5 (3) MG/3ML SOLN, Take 3 mLs by nebulization every 6 (six) hours as needed., Disp: 270 mL, Rfl: 0   Magnesium 400 MG CAPS, Take 1 tablet by mouth daily., Disp: , Rfl:    nitroGLYCERIN (NITROSTAT) 0.4 MG SL tablet, Place under the tongue., Disp: , Rfl:    oxyCODONE (OXY IR/ROXICODONE) 5 MG immediate release tablet, Take 1-2 tablets (5-10 mg total) by mouth every 4 (four) hours as needed for severe pain or moderate pain., Disp: 30 tablet, Rfl: 0   pregabalin (LYRICA) 150 MG capsule, Take 150 mg by mouth 3 (three) times daily., Disp: , Rfl:    senna-docusate (SENOKOT-S) 8.6-50 MG tablet, Take 2 tablets by mouth at bedtime., Disp: 30 tablet, Rfl: 0  tiZANidine (ZANAFLEX) 2 MG tablet, Take 1-2 tablets (2-4 mg total) by mouth at bedtime as needed for muscle spasms., Disp: 60 tablet, Rfl: 1   torsemide (DEMADEX) 20 MG tablet, Take 1 tablet (20 mg total) by mouth daily as needed (weight gain or swelling)., Disp: 30 tablet, Rfl: 1   TRELEGY ELLIPTA 100-62.5-25 MCG/ACT AEPB, Inhale 1 puff into the lungs daily., Disp: 1 each, Rfl: 3   vitamin B-12 (CYANOCOBALAMIN) 1000 MCG tablet, Take 1,000 mcg by mouth daily., Disp: , Rfl:    Vitamin E 268 MG (400 UNIT) CAPS, Take 400 Units by mouth daily., Disp: , Rfl:   ROS  Constitutional: Denies any fever or chills Gastrointestinal: No reported hemesis, hematochezia, vomiting, or acute GI distress Musculoskeletal:  Left ankle pain Neurological: No reported episodes of acute onset apraxia, aphasia, dysarthria, agnosia, amnesia, paralysis, loss of coordination, or loss of consciousness  Allergies  Ms. Godley is allergic  to penicillins.  PFSH  Drug: Ms. Cloutier  reports no history of drug use. Alcohol:  reports no history of alcohol use. Tobacco:  reports that she has quit smoking. Her smoking use included cigarettes. She has a 10 pack-year smoking history. She has never used smokeless tobacco. Medical:  has a past medical history of (HFpEF) heart failure with preserved ejection fraction (HCC) (06/07/2004), Anxiety, Aortic atherosclerosis (HCC), Asthma, CAD (coronary artery disease) (06/08/2004), COPD (chronic obstructive pulmonary disease) (HCC), Depression, Diverticulosis, DOE (dyspnea on exertion), Empyema of right pleural space (HCC) (02/16/2018), HLD (hyperlipidemia), Hypertension, IDA (iron deficiency anemia), Ischemic cardiomyopathy, NSTEMI (non-ST elevated myocardial infarction) (HCC) (06/05/2004), Osteoporosis, Peripheral vascular disease (HCC), and PONV (postoperative nausea and vomiting). Surgical: Ms. Billen  has a past surgical history that includes Reduction mammaplasty; Colonoscopy with propofol (N/A, 04/12/2017); Partial hysterectomy (N/A); Coronary angioplasty with stent (Left, 06/08/2004); Endobronchial ultrasound (N/A, 11/28/2017); Lower Extremity Angiography (Right, 08/12/2021); Video assisted thoracoscopy (vats)/thorocotomy (Right, 03/2018); Endarterectomy femoral (Bilateral, 09/16/2021); and Insertion of iliac stent (Bilateral, 09/16/2021). Family: family history includes Breast cancer (age of onset: 28) in an other family member; Breast cancer (age of onset: 25) in her maternal aunt and mother; Dementia in her mother; Diabetes in her father and sister; Heart disease in her father; Hypertension in her father and mother.  Constitutional Exam  General appearance: Well nourished, well developed, and well hydrated. In no apparent acute distress Vitals:   02/08/23 1115  BP: (!) 151/74  Pulse: 73  Resp: 16  Temp: 98.1 F (36.7 C)  TempSrc: Temporal  SpO2: 95%  Weight: 140 lb (63.5 kg)  Height: 5'  (1.524 m)   BMI Assessment: Estimated body mass index is 27.34 kg/m as calculated from the following:   Height as of this encounter: 5' (1.524 m).   Weight as of this encounter: 140 lb (63.5 kg).  BMI interpretation table: BMI level Category Range association with higher incidence of chronic pain  <18 kg/m2 Underweight   18.5-24.9 kg/m2 Ideal body weight   25-29.9 kg/m2 Overweight Increased incidence by 20%  30-34.9 kg/m2 Obese (Class I) Increased incidence by 68%  35-39.9 kg/m2 Severe obesity (Class II) Increased incidence by 136%  >40 kg/m2 Extreme obesity (Class III) Increased incidence by 254%   Patient's current BMI Ideal Body weight  Body mass index is 27.34 kg/m. Ideal body weight: 45.5 kg (100 lb 4.9 oz) Adjusted ideal body weight: 52.7 kg (116 lb 3 oz)   BMI Readings from Last 4 Encounters:  02/08/23 27.34 kg/m  01/31/23 27.34 kg/m  01/27/23 27.34 kg/m  07/15/22  24.59 kg/m   Wt Readings from Last 4 Encounters:  02/08/23 140 lb (63.5 kg)  01/31/23 140 lb (63.5 kg)  01/27/23 140 lb (63.5 kg)  07/15/22 132 lb 4.4 oz (60 kg)    Psych/Mental status: Alert, oriented x 3 (person, place, & time)       Eyes: PERLA Respiratory: No evidence of acute respiratory distress  Lumbar Spine Area Exam  Skin & Axial Inspection: No masses, redness, or swelling Alignment: Symmetrical Functional ROM: Unrestricted ROM       Stability: No instability detected Muscle Tone/Strength: Functionally intact. No obvious neuro-muscular anomalies detected. Sensory (Neurological): Unimpaired   Gait & Posture Assessment  Ambulation: Unassisted Gait: Relatively normal for age and body habitus Posture: WNL    Lower Extremity Exam      Side: Right lower extremity   Side: Left lower extremity  Stability: No instability observed           Stability: No instability observed          Skin & Extremity Inspection: Skin color, temperature, and hair growth are WNL. No peripheral edema or cyanosis.  No masses, redness, swelling, asymmetry, or associated skin lesions. No contractures.   Skin & Extremity Inspection: Left ankle and leg in bandage  Functional ROM: Pain restricted ROM for hip joint           Functional ROM: Pain restricted ROM for hip joint and left ankle          Muscle Tone/Strength: Functionally intact. No obvious neuro-muscular anomalies detected.   Muscle Tone/Strength: Functionally intact. No obvious neuro-muscular anomalies detected.  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 Diagnoses of Peripheral vascular disease (HCC) (hx of femoral endarterectomy September 17 2021), Neuropathic pain of lower extremity, and Chronic pain syndrome were also pertinent to this visit.  Visit Diagnosis: 1. 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 Peripheral vascular disease (HCC) (hx of femoral endarterectomy September 17 2021)   3. Neuropathic pain of lower extremity   4. Chronic pain syndrome      Plan of Care  Pharmacotherapy (Medications Ordered): Meds ordered this encounter  Medications   buprenorphine (BUTRANS) 5 MCG/HR PTWK    Sig: Place 1 patch onto the skin once a week for 28 days.    Dispense:  4 patch    Refill:  0    Chronic Pain: STOP Act (Not applicable) Fill 1 day early if closed on refill date. Avoid benzodiazepines within 8 hours of opioids   buprenorphine (BUTRANS) 7.5 MCG/HR    Sig: Place 1 patch onto the skin once a week for 28 days.    Dispense:  4 patch     Refill:  0    Chronic Pain: STOP Act (Not applicable) Fill 1 day early if closed on refill date. Avoid benzodiazepines within 8 hours of opioids      We also discussed spinal cord stimulation for complex regional pain syndrome. Continue gabapentin and Lyrica as prescribed by neurology.  Provider-requested follow-up: Return in about 8 weeks (around 04/05/2023) for Medication Management, in person. Recent Visits Date Type Provider Dept  01/27/23 Office Visit Edward Jolly, MD [facility]  Showing recent visits within past 90 days and meeting all other requirements Today's Visits Date Type Provider Dept  02/08/23 Office Visit Edward Jolly, MD [facility]  Showing today's visits and meeting all other requirements Future Appointments Date Type Provider Dept  03/31/23 Appointment Edward Jolly, MD [facility]  Showing future appointments within next 90 days and meeting all other requirements   Primary Care Physician: Wilford Corner, PA-C  Duration of encounter: .  Total time on encounter, as per AMA guidelines included both the face-to-face and non-face-to-face time personally spent by the physician and/or other qualified health care professional(s) on the day of the encounter (includes time in activities that require the physician or other qualified health care professional and does not include time in activities normally performed by clinical staff). Physician's time may include the following activities when performed: Preparing to see the patient (e.g., pre-charting review of records, searching for previously ordered imaging, lab work, and nerve conduction tests) Review of prior analgesic pharmacotherapies. Reviewing PMP Interpreting ordered tests (e.g., lab work, imaging, nerve conduction tests) Performing post-procedure evaluations, including interpretation of diagnostic procedures Obtaining and/or reviewing separately obtained  history Performing a medically appropriate examination and/or evaluation Counseling and educating the patient/family/caregiver Ordering medications, tests, or procedures Referring and communicating with other health care professionals (when not separately reported) Documenting clinical information in the electronic or other health record Independently interpreting results (not separately reported) and communicating results to the patient/ family/caregiver Care coordination (not separately reported)  Note by: Edward Jolly, MD (TTS technology used. I apologize for any typographical errors that were not detected and corrected.) Date: 02/08/2023; Time: 12:03 PM

## 2023-02-08 NOTE — Patient Instructions (Addendum)
Try Butrans 5 mcg/HR patches, if no benefit in 2-3 weeks, begin using 7.5 mcgHR patches.

## 2023-02-10 ENCOUNTER — Other Ambulatory Visit: Payer: Self-pay | Admitting: Podiatry

## 2023-02-10 ENCOUNTER — Ambulatory Visit
Admission: RE | Admit: 2023-02-10 | Discharge: 2023-02-10 | Disposition: A | Discharge status: Home / Self Care | Payer: HMO | Source: Ambulatory Visit | Attending: Podiatry | Admitting: Podiatry

## 2023-02-10 DIAGNOSIS — R609 Edema, unspecified: Secondary | ICD-10-CM | POA: Diagnosis not present

## 2023-02-10 DIAGNOSIS — R600 Localized edema: Secondary | ICD-10-CM | POA: Diagnosis not present

## 2023-02-10 DIAGNOSIS — M7989 Other specified soft tissue disorders: Secondary | ICD-10-CM | POA: Diagnosis not present

## 2023-02-10 DIAGNOSIS — S82892A Other fracture of left lower leg, initial encounter for closed fracture: Secondary | ICD-10-CM | POA: Diagnosis not present

## 2023-02-24 DIAGNOSIS — S82892G Other fracture of left lower leg, subsequent encounter for closed fracture with delayed healing: Secondary | ICD-10-CM | POA: Diagnosis not present

## 2023-02-24 DIAGNOSIS — G90523 Complex regional pain syndrome I of lower limb, bilateral: Secondary | ICD-10-CM | POA: Diagnosis not present

## 2023-02-24 DIAGNOSIS — I739 Peripheral vascular disease, unspecified: Secondary | ICD-10-CM | POA: Diagnosis not present

## 2023-02-24 DIAGNOSIS — I2510 Atherosclerotic heart disease of native coronary artery without angina pectoris: Secondary | ICD-10-CM | POA: Diagnosis not present

## 2023-02-25 ENCOUNTER — Telehealth (INDEPENDENT_AMBULATORY_CARE_PROVIDER_SITE_OTHER): Payer: Self-pay

## 2023-02-25 ENCOUNTER — Encounter: Payer: Self-pay | Admitting: Urgent Care

## 2023-02-25 ENCOUNTER — Other Ambulatory Visit: Payer: Self-pay | Admitting: Student in an Organized Health Care Education/Training Program

## 2023-02-25 ENCOUNTER — Other Ambulatory Visit: Payer: Self-pay | Admitting: Podiatry

## 2023-02-25 NOTE — Telephone Encounter (Signed)
Spoke with the patient and and she is scheduled with Dr. Wyn Quaker on 03/03/23 with a 10:00 am arrival time to the [facility]. Pre-procedure instructions were discussed and will be sent to Mychart and mailed.

## 2023-03-01 ENCOUNTER — Other Ambulatory Visit: Payer: Self-pay

## 2023-03-01 ENCOUNTER — Encounter
Admission: RE | Admit: 2023-03-01 | Discharge: 2023-03-01 | Disposition: A | Discharge status: Home / Self Care | Payer: HMO | Source: Ambulatory Visit | Attending: Podiatry | Admitting: Podiatry

## 2023-03-01 VITALS — Ht 60.0 in | Wt 140.0 lb

## 2023-03-01 DIAGNOSIS — G5773 Causalgia of bilateral lower limbs: Secondary | ICD-10-CM

## 2023-03-01 DIAGNOSIS — J9601 Acute respiratory failure with hypoxia: Secondary | ICD-10-CM

## 2023-03-01 DIAGNOSIS — D638 Anemia in other chronic diseases classified elsewhere: Secondary | ICD-10-CM

## 2023-03-01 DIAGNOSIS — I5032 Chronic diastolic (congestive) heart failure: Secondary | ICD-10-CM

## 2023-03-01 DIAGNOSIS — G5771 Causalgia of right lower limb: Secondary | ICD-10-CM

## 2023-03-01 DIAGNOSIS — R1310 Dysphagia, unspecified: Secondary | ICD-10-CM

## 2023-03-01 HISTORY — DX: Acute respiratory failure with hypoxia: J96.01

## 2023-03-01 HISTORY — DX: Dysphagia, unspecified: R13.10

## 2023-03-01 HISTORY — DX: Causalgia of bilateral lower limbs: G57.73

## 2023-03-01 HISTORY — DX: Other specified disorders of bone density and structure, unspecified site: M85.80

## 2023-03-01 HISTORY — DX: Long term (current) use of antithrombotics/antiplatelets: Z79.02

## 2023-03-01 HISTORY — DX: Unspecified cirrhosis of liver: K74.60

## 2023-03-01 HISTORY — DX: Pneumonia, unspecified organism: J18.9

## 2023-03-01 HISTORY — DX: Metabolic encephalopathy: G93.41

## 2023-03-01 HISTORY — DX: Anemia in other chronic diseases classified elsewhere: D63.8

## 2023-03-01 HISTORY — DX: Long term (current) use of aspirin: Z79.82

## 2023-03-01 NOTE — Progress Notes (Incomplete)
Perioperative / Anesthesia Services  Pre-Admission Testing Clinical Review / Pre-Operative Anesthesia Consult  Date: 03/01/23  Patient Demographics:  Name: Wendy Arias DOB:   February 13, 1951 MRN:   [id number]  Planned Surgical Procedure(s):    Case: [id number] Date/Time: 03/03/23 1100   Procedure: Lower Extremity Angiography (Left: Leg Lower)   Anesthesia type: Moderate Sedation   Diagnosis: Atherosclerosis of artery of extremity with ulceration (HCC) [I70.299, L97.909]   Pre-op diagnosis: LLE Angio   ASO w ulceration   Location: [facility]   Providers: Annice Needy, MD     NOTE: Available PAT nursing documentation and vital signs have been reviewed. Clinical nursing staff has updated patient's PMH/PSHx, current medication list, and drug allergies/intolerances to ensure comprehensive history available to assist in medical decision making as it pertains to the aforementioned surgical procedure and anticipated anesthetic course. Extensive review of available clinical information personally performed.  PMH and PSHx updated with any diagnoses/procedures that  may have been inadvertently omitted during her intake with the pre-admission testing department's nursing staff.  Clinical Discussion:  Wendy Arias is a 72 y.o. female who is submitted for pre-surgical anesthesia review and clearance prior to her undergoing the above procedure. Patient is a Former Smoker (10 pack years). Pertinent PMH includes: CAD, NSTEMI, ischemic cardiomyopathy, HFpEF, aortic atherosclerosis, PVD, HTN, HLD, DOE, COPD, asthma, dysphagia, IDA/anemia of chronic disease, cirrhosis, anxiety, depression, complex regional pain syndrome (on buprenorphine).  Patient is followed by cardiology Darrold Junker, MD). She was last seen in the cardiology clinic on ***; notes reviewed. ***At the time of her clinic visit, patient doing well overall from a cardiovascular perspective. Patient denied any chest pain,  shortness of breath, PND, orthopnea, palpitations, significant peripheral edema, weakness, fatigue, vertiginous symptoms, or presyncope/syncope. Patient with a past medical history significant for cardiovascular diagnoses. Documented physical exam was grossly benign, providing no evidence of acute exacerbation and/or decompensation of the patient's known cardiovascular conditions.  Patient suffered an NSTEMI on 06/05/2004.     TTE performed on 06/07/2004 revealed a normal left ventricular systolic function with EF 50-55%.  There was inferior hypokinesis noted. There is no evidence of significant valvular regurgitation or transvalvular gradient suggestive of stenosis.      Diagnostic left heart catheterization was performed on 06/08/2004 revealing a normal left ventricular systolic function with an EF of 70%.  There was multivessel CAD; 50% mid LAD, 50% distal LAD, 99% OM3, 50% proximal RCA, and 50% mid RCA.  PCI was performed placing a DES x1 (unknown type) to the OM3 lesion restoring TIMI-3 flow.   Patient with known PVD. ABI/TBI study on 08/04/2021 revealed severe RIGHT and moderate LEFT lower extremity arterial disease.  Subsequent lower extremity angiography study on 08/12/2021 revealed 50% stenosis in the RIGHT proximal common iliac artery and 80% stenosis in the mid to distal segment of the RIGHT external iliac artery.  There was occlusion of the LEFT common femoral artery with reconstitution of both the PFA and SFA.  There was a small diseased RIGHT common femoral artery with a relatively normal PFA.  The RIGHT SFA had a flush occlusion and reconstituted in the distal SFA with a separate distinct high-grade stenotic lesion in the proximal to mid above-the-knee popliteal artery. Patient underwent BILATERAL femoral endarterectomies and open angioplasty with stent placement to the BILATERAL external iliac arteries on 09/16/2021.   Myocardial perfusion imaging study performed on 09/14/2021 revealed a  normal left ventricular systolic function with an EF of 64%.  There were no regional wall motion abnormalities.  There was no evidence of stress-induced myocardial ischemia or arrhythmia.  Study determined to be normal and low risk.  Most recent TTE was performed on 02/02/2023 revealing a normal left ventricular systolic function with an EF of 60-65%.  There were no regional wall motion abnormalities. Left ventricular diastolic Doppler parameters consistent with abnormal relaxation (G1DD). Right ventricular size and function normal. There was trivial mitral valve regurgitation. All transvalvular gradients were noted to be normal providing no evidence suggestive of valvular stenosis. Aorta normal in size with no evidence of aneurysmal dilatation.   Blood pressure***controlled at *** mmHg on currently prescribed*** therapies.  Patient is on***for her HLD diagnosis and ASCVD prevention. Patient is not diabetic/T2DM***controlled on currently prescribed regimen; last HgbA1c was***when checked on***. She does not have an OSAH diagnosis. ***FC. No changes were made to her medication regimen during her visit with cardiology.  Patient scheduled to follow-up with outpatient cardiology in***months or sooner if needed.  Wendy Arias is scheduled for an OPEN REDUCTION INTERNAL FIXATION (ORIF) OF A TRIMALLEOLAR ANKLE FRACTURE on 03/04/2023 with Dr. Gwyneth Revels, DPM.  Given patient's past medical history significant for cardiovascular diagnoses, presurgical cardiac clearance was sought by the PAT team. ***.    Again, this patient is on daily oral antithrombotic therapy using a ASA + CLOPIDOGREL. At the time of her PAT interview on 03/01/2023, patient had already taken her DAPT medications for the day. This information was communicated to Dr. Ether Griffins. Surgeon ok with patient starting hold of both the ASA and CLOPIDOGREL starting tomorrow (03/02/2023), with plans to restart on POD-1.   Patient reports previous  perioperative complications with anesthesia in the past. Patient has a PMH (+) for PONV. Symptoms and history of PONV will be discussed with patient by anesthesia team on the day of her procedure. Interventions will be ordered as deemed necessary based on patient's individual care needs as determined by anesthesiologist. In review of the available records, it is noted that patient underwent a general anesthetic course here at [facility] (AS[facility]) in 08/2021 without documented complications.      03/01/2023    9:49 AM 02/08/2023   11:15 AM 02/04/2023   12:30 AM  Vitals with BMI  Height 5\' 0"  5\' 0"    Weight 140 lbs 140 lbs   BMI 27.34 27.34   Systolic  151 102  Diastolic  74 82  Pulse  73 75    Providers/Specialists:   NOTE: Primary physician provider listed below. Patient may have been seen by APP or partner within same practice.   PROVIDER ROLE / SPECIALTY LAST Sullivan Lone, DPM Podiatry (Surgeon) 02/24/2023  Wilford Corner, PA-C Primary Care Provider 12/13/2022  Marcina Millard, MD Cardiology 01/20/2023  Levora Dredge, MD Vascular Surgery 02/03/2023; seen during inpatient consult  Edward Jolly, MD Pain Management 02/08/2023  Vida Rigger, MD Pulmonary Medicine 02/08/2023   Allergies:  Penicillins  Current Home Medications:    acetaminophen (TYLENOL) 500 MG tablet   albuterol (PROVENTIL HFA;VENTOLIN HFA) 108 (90 Base) MCG/ACT inhaler   amitriptyline (ELAVIL) 100 MG tablet   aspirin EC 81 MG tablet   atorvastatin (LIPITOR) 40 MG tablet   buprenorphine (BUTRANS) 5 MCG/HR PTWK   clopidogrel (PLAVIX) 75 MG tablet   gabapentin (NEURONTIN) 300 MG capsule   hydrOXYzine (ATARAX) 25 MG tablet   ipratropium-albuterol (DUONEB) 0.5-2.5 (3) MG/3ML SOLN   Magnesium 400 MG CAPS   nitroGLYCERIN (NITROSTAT) 0.4  MG SL tablet   oxyCODONE (OXY IR/ROXICODONE) 5 MG immediate release tablet   pregabalin (LYRICA) 150 MG capsule    tiZANidine (ZANAFLEX) 2 MG tablet   torsemide (DEMADEX) 20 MG tablet   TRELEGY ELLIPTA 100-62.5-25 MCG/ACT AEPB   vitamin B-12 (CYANOCOBALAMIN) 1000 MCG tablet   Vitamin E 268 MG (400 UNIT) CAPS   buprenorphine (BUTRANS) 7.5 MCG/HR   senna-docusate (SENOKOT-S) 8.6-50 MG tablet   No current facility-administered medications for this encounter.   History:   Past Medical History:  Diagnosis Date   (HFpEF) heart failure with preserved ejection fraction (HCC) 06/07/2004   a.) TTE 06/07/2004: EF 50-55%; inf HK. b.) TTE 04/17/2018: EF 45%; GLS -14.6%; inferoseptal and inferoposterior HK; sev LA enlargement; mild PR, mod TR, sev MR; G2DD. c.) TTE 12/05/2018: EF >55%, mild LVH; mild MR. d.) TTE 12/18/2020: EF >55%; mild LA enlargement; triv TR/PR, mod MR; G1DD; e.) TTE 07/13/2022: EF 55-60%, no RWMAs, mild MR;  f.) TTE 02/02/2023: EF 60-65%, no RMWas, triv MR, G1DD   Acute metabolic encephalopathy    Acute respiratory failure with hypoxia (HCC)    Anemia of chronic disease    Anxiety    Aortic atherosclerosis (HCC)    Asthma    CAD (coronary artery disease) 06/08/2004   a.) NSTEMI 06/05/2004. b.) LHC 06/08/2004: EF 70%; 50% mLAD, 50% dLAD, 99% OM3, 50% pRCA, 50% mRCA; PCI to OM3 placing a DES x 1 (unknown type). c.) Lexi scan 09/14/2021: normal LV function with no ischemia or scar.   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    a.) followed by pain management; on transdermal buprenorphine   COPD (chronic obstructive pulmonary disease) (HCC)    Depression    Diverticulosis    DOE (dyspnea on exertion)    Dysphagia    Empyema of right pleural space (HCC) 02/16/2018   Hepatic cirrhosis (HCC)    HLD (hyperlipidemia)    Hypertension    IDA (iron deficiency anemia)    Ischemic cardiomyopathy    a.) TTE 06/07/2004: 50-55%; b.) TTE 04/17/2018: EF 45%; c.) TTE 12/05/2018: >55%; d.) TTE 12/18/2020: >55%; e.) MV 09/14/2021: EF 64%; f.) TTE 07/13/2022: EF 55-60%; g.) TTE  02/02/2023: 60-65%   Long term current use of aspirin    Long term current use of clopidogrel    NSTEMI (non-ST elevated myocardial infarction) (HCC) 06/05/2004   a.)  PCI on 06/08/2004 placing a DES x1 (unknown type) to 99% lesion in OM3.   Osteoporosis    Peripheral vascular disease (HCC)    Pneumonia    PONV (postoperative nausea and vomiting)    Past Surgical History:  Procedure Laterality Date   COLONOSCOPY WITH PROPOFOL N/A 04/12/2017   Procedure: COLONOSCOPY WITH PROPOFOL;  Surgeon: Christena Deem, MD;  Location: [facility] ENDOSCOPY;  Service: Endoscopy;  Laterality: N/A;   CORONARY ANGIOPLASTY WITH STENT PLACEMENT Left 06/08/2004   Procedure: CORONARY ANGIOPLASTY WITH STENT PLACEMENT; Location: [facility]; Surgeon: Rudean Hitt, MD   ENDARTERECTOMY FEMORAL Bilateral 09/16/2021   Procedure: ENDARTERECTOMY FEMORAL;  Surgeon: Annice Needy, MD;  Location: [facility] ORS;  Service: Vascular;  Laterality: Bilateral;   ENDOBRONCHIAL ULTRASOUND N/A 11/28/2017   Procedure: ENDOBRONCHIAL ULTRASOUND;  Surgeon: Shane Crutch, MD;  Location: [facility] ORS;  Service: Pulmonary;  Laterality: N/A;   INSERTION OF ILIAC STENT Bilateral 09/16/2021   Procedure: INSERTION OF ILIAC STENT;  Surgeon: Annice Needy, MD;  Location: [facility] ORS;  Service: Vascular;  Laterality: Bilateral;   LOWER EXTREMITY ANGIOGRAPHY Right  08/12/2021   Procedure: Lower Extremity Angiography;  Surgeon: Annice Needy, MD;  Location: [facility] INVASIVE CV LAB;  Service: Cardiovascular;  Laterality: Right;   PARTIAL HYSTERECTOMY N/A    REDUCTION MAMMAPLASTY     VIDEO ASSISTED THORACOSCOPY (VATS)/THOROCOTOMY Right 03/2018   Procedure: VIDEO ASSISTED THORACOSCOPY (VATS)/THOROCOTOMY (partial decortication, loculated empyema); Location: [facility]   Family History  Problem Relation Age of Onset   Breast cancer Mother 31   Hypertension Mother    Dementia Mother    Breast cancer Maternal Aunt 63   Breast cancer Other 35   Hypertension Father     Heart disease Father    Diabetes Father    Diabetes Sister    Social History   Tobacco Use   Smoking status: Former    Current packs/day: 0.50    Average packs/day: 0.5 packs/day for 20.0 years (10.0 ttl pk-yrs)    Types: Cigarettes   Smokeless tobacco: Never  Vaping Use   Vaping status: Some Days   Substances: Nicotine, Flavoring  Substance Use Topics   Alcohol use: Never    Alcohol/week: 3.0 standard drinks of alcohol    Types: 1 Glasses of wine, 2 Cans of beer per week    Comment: rarely   Drug use: Never    Pertinent Clinical Results:  LABS:   Lab Results  Component Value Date   WBC 11.1 (H) 02/02/2023   HGB 12.4 02/02/2023   HCT 38.2 02/02/2023   MCV 88.2 02/02/2023   PLT 182 02/02/2023   Lab Results  Component Value Date   NA 141 02/04/2023   K 4.2 02/04/2023   CO2 29 02/04/2023   GLUCOSE 160 (H) 02/04/2023   BUN 25 (H) 02/04/2023   CREATININE 0.76 02/04/2023   CALCIUM 8.1 (L) 02/04/2023   GFRNONAA >60 02/04/2023    ECG: Date: 01/31/2023 Time ECG obtained: 1407 PM Rate: 80 bpm Rhythm: normal sinus Axis (leads I and aVF): Normal Intervals: PR 188 ms. QRS 80 ms. QTc 482 ms. ST segment and T wave changes: Non-specific ST and T wave abnormalities. Comparison: Similar to previous tracing obtained on 07/13/2022   IMAGING / PROCEDURES: TRANSTHORACIC ECHOCARDIOGRAM performed on 02/02/2023 Left ventricular ejection fraction, by estimation, is 60 to 65%. The left ventricle has normal function. The left ventricle has no regional  wall motion abnormalities. Left ventricular diastolic parameters are consistent with Grade I diastolic dysfunction (impaired relaxation).  Right ventricular systolic function is normal. The right ventricular size is normal. Mildly increased right ventricular wall thickness.  The mitral valve is normal in structure. Trivial mitral valve regurgitation.  The aortic valve is normal in structure. Aortic valve regurgitation is not  visualized.   DG ANKLE COMPLETE LEFT performed on 01/31/2023 Mildly displaced trimalleolar fracture with intra-articular extension. Diffuse osteopenia.  MYOCARDIAL PERFUSION IMAGING STUDY (LEXISCAN) performed on 09/14/2021 LVEF 64% Normal myocardial thickening and wall motion No artifact Left ventricular cavity size normal No evidence of stress-induced myocardial ischemia or arrhythmia Normal low risk study.   VASCULAR US ABI WITH/WITHOUT TBI performed on 10/29/2021 Resting right ankle-brachial index is within normal range. No evidence of significant right lower extremity arterial disease. The right  toe-brachial index is normal. Resting left ankle-brachial index is within normal range. No evidence of significant left lower extremity arterial disease. The left  toe-brachial index is normal.   LOWER EXTREMITY ANGIOGRAPHY performed on 08/12/2021 50% stenosis in the RIGHT proximal common iliac artery and 80% stenosis in the mid to distal segment of the RIGHT external  iliac artery.   There was occlusion of the LEFT common femoral artery with reconstitution of both the PFA and SFA.   There was a small diseased RIGHT common femoral artery with a relatively normal PFA.   The RIGHT SFA had a flush occlusion and reconstituted in the distal SFA with a separate distinct high-grade stenotic lesion in the proximal to mid above-the-knee popliteal artery.   Impression and Plan:  Wendy Arias has been referred for pre-anesthesia review and clearance prior to her undergoing the planned anesthetic and procedural courses. Available labs, pertinent testing, and imaging results were personally reviewed by me in preparation for upcoming operative/procedural course. [facility] medical record has been updated following extensive record review and patient interview with PAT staff.   ATTENTION --> PENDING CLEARANCE AT THIS TIME -- NOTE/CONTENTS NOT FINAL UNTIL SIGNED This patient has been appropriately cleared by  cardiology with an overall *** risk of experiencing significant perioperative cardiovascular complications. Based on clinical review performed today (03/01/23), barring any significant acute changes in the patient's overall condition, it is anticipated that she will be able to proceed with the planned surgical intervention. Any acute changes in clinical condition may necessitate her procedure being postponed and/or cancelled. Patient will meet with anesthesia team (MD and/or CRNA) on the day of her procedure for preoperative evaluation/assessment. Questions regarding anesthetic course will be fielded at that time.   Pre-surgical instructions were reviewed with the patient during her PAT appointment, and questions were fielded to satisfaction by PAT clinical staff. She has been instructed on which medications that she will need to hold prior to surgery, as well as the ones that have been deemed safe/appropriate to take on the day of her procedure. As part of the general education provided by PAT, patient made aware both verbally and in writing, that she would need to abstain from the use of any illegal substances during her perioperative course.  She was advised that failure to follow the provided instructions could necessitate case cancellation or result in serious perioperative complications up to and including death. Patient encouraged to contact PAT and/or her surgeon's office to discuss any questions or concerns that may arise prior to surgery; verbalized understanding.   Quentin Mulling, MSN, APRN, FNP-C, CEN [facility]  Peri-operative Services Nurse Practitioner Phone: [phone] Fax: [phone] 03/01/23 12:16 PM  NOTE: This note has been prepared using Dragon dictation software. Despite my best ability to proofread, there is always the potential that unintentional transcriptional errors may still occur from this process.

## 2023-03-01 NOTE — Patient Instructions (Addendum)
Your procedure is scheduled on: Friday 03/04/23 To find out your arrival time, please call [phone] between 1PM - 3PM on:   Thursday 03/03/23 Report to the Registration Desk on the 1st floor of the [facility]. FREE Valet parking is available.  If your arrival time is 6:00 am, do not arrive before that time as the [facility] entrance doors do not open until 6:00 am.  REMEMBER: Instructions that are not followed completely may result in serious medical risk, up to and including death; or upon the discretion of your surgeon and anesthesiologist your surgery may need to be rescheduled.  Do not eat food after midnight the night before surgery.  No gum chewing or hard candies.  You may however, drink CLEAR liquids up to 2 hours before you are scheduled to arrive for your surgery. Do not drink anything within 2 hours of your scheduled arrival time.  Clear liquids include: - water  - apple juice without pulp - gatorade (not RED colors) - black coffee or tea (Do NOT add milk or creamers to the coffee or tea) Do NOT drink anything that is not on this list.  Type 1 and Type 2 diabetics should only drink water.  In addition, your doctor has ordered for you to drink the provided:  Ensure Pre-Surgery Clear Carbohydrate Drink (pick this up at our office in [facility] [address]) Drinking this carbohydrate drink up to two hours before surgery helps to reduce insulin resistance and improve patient outcomes. Please complete drinking 2 hours before scheduled arrival time.  One week prior to surgery: Stop Anti-inflammatories (NSAIDS) such as Advil, Aleve, Ibuprofen, Motrin, Naproxen, Naprosyn and Aspirin based products such as Excedrin, Goody's Powder, BC Powder. You may however, continue to take Tylenol if needed for pain up until the day of surgery.  Stop ANY OVER THE COUNTER supplements or vitamins until after surgery.  Continue taking all prescribed medications with the exception of  the following: Hold your Plavix and Aspirin stating today 03/01/23.  TAKE ONLY THESE MEDICATIONS THE MORNING OF SURGERY WITH A SIP OF WATER:  atorvastatin (LIPITOR) 40 MG tablet  gabapentin (NEURONTIN) 300 MG capsule  oxyCODONE (OXY IR/ROXICODONE) 5 MG - 10 mg immediate release tablet  pregabalin (LYRICA) 150 MG capsule  tiZANidine (ZANAFLEX) if needed hydrOXYzine (ATARAX) if needed  Use inhalers on the day of surgery and bring to the hospital. Use your nebulizer the Friday before surgery.  No Alcohol for 24 hours before or after surgery.  No Smoking including e-cigarettes for 24 hours before surgery.  No chewable tobacco products for at least 6 hours before surgery.  No nicotine patches on the day of surgery.  Do not use any "recreational" drugs for at least a week (preferably 2 weeks) before your surgery.  Please be advised that the combination of cocaine and anesthesia may have negative outcomes, up to and including death. If you test positive for cocaine, your surgery will be cancelled.  On the morning of surgery brush your teeth with toothpaste and water, you may rinse your mouth with mouthwash if you wish. Do not swallow any toothpaste or mouthwash.  Use CHG Soap or wipes as directed on instruction sheet. You can pick this up at our office at [address] [facility] [address]  Do not wear lotions, powders, or perfumes.   Do not shave body hair from the neck down 48 hours before surgery.  Wear comfortable clothing (specific to your surgery  type) to the hospital.  Do not wear jewelry, make-up, hairpins, clips or nail polish.  Contact lenses, hearing aids and dentures may not be worn into surgery.  Do not bring valuables to the hospital. [facility] is not responsible for any missing/lost belongings or valuables.   Notify your doctor if there is any change in your medical condition (cold, fever, infection).  If you are being discharged the day of  surgery, you will not be allowed to drive home. You will need a responsible individual to drive you home and stay with you for 24 hours after surgery.   If you are taking public transportation, you will need to have a responsible individual with you.  If you are being admitted to the hospital overnight, leave your suitcase in the car. After surgery it may be brought to your room.  In case of increased patient census, it may be necessary for you, the patient, to continue your postoperative care in the Same Day Surgery department.  After surgery, you can help prevent lung complications by doing breathing exercises.  Take deep breaths and cough every 1-2 hours. Your doctor may order a device called an Incentive Spirometer to help you take deep breaths. When coughing or sneezing, hold a pillow firmly against your incision with both hands. This is called "splinting." Doing this helps protect your incision. It also decreases belly discomfort.  Surgery Visitation Policy:  Patients undergoing a surgery or procedure may have two family members or support persons with them as long as the person is not COVID-19 positive or experiencing its symptoms.   Inpatient Visitation:    Visiting hours are 7 a.m. to 8 p.m. Up to four visitors are allowed at one time in a patient room. The visitors may rotate out with other people during the day. One designated support person (adult) may remain overnight.  Please call the Pre-admissions Testing Dept. at [phone] if you have any questions about these instructions.     Preparing for Surgery with CHLORHEXIDINE GLUCONATE (CHG) Soap  Chlorhexidine Gluconate (CHG) Soap  o An antiseptic cleaner that kills germs and bonds with the skin to continue killing germs even after washing  o Used for showering the night before surgery and morning of surgery  Before surgery, you can play an important role by reducing the number of germs on your skin.  CHG  (Chlorhexidine gluconate) soap is an antiseptic cleanser which kills germs and bonds with the skin to continue killing germs even after washing.  Please do not use if you have an allergy to CHG or antibacterial soaps. If your skin becomes reddened/irritated stop using the CHG.  1. Shower the NIGHT BEFORE SURGERY and the MORNING OF SURGERY with CHG soap.  2. If you choose to wash your hair, wash your hair first as usual with your normal shampoo.  3. After shampooing, rinse your hair and body thoroughly to remove the shampoo.  4. Use CHG as you would any other liquid soap. You can apply CHG directly to the skin and wash gently with a scrungie or a clean washcloth.  5. Apply the CHG soap to your body only from the neck down. Do not use on open wounds or open sores. Avoid contact with your eyes, ears, mouth, and genitals (private parts). Wash face and genitals (private parts) with your normal soap.  6. Wash thoroughly, paying special attention to the area where your surgery will be performed.  7. Thoroughly rinse your body with warm water.  8. Do not shower/wash with your normal soap after using and rinsing off the CHG soap.  9. Pat yourself dry with a clean towel.  10. Wear clean pajamas to bed the night before surgery.  12. Place clean sheets on your bed the night of your first shower and do not sleep with pets.  13. Shower again with the CHG soap on the day of surgery prior to arriving at the hospital.  14. Do not apply any deodorants/lotions/powders.  15. Please wear clean clothes to the hospital.

## 2023-03-01 NOTE — Pre-Procedure Instructions (Signed)
Patient to hold both ASA and Plavix for ankle surgery on Friday 03/04/23. Patient has already taken doses today but understands to hold going forward until instructed to restart.

## 2023-03-03 ENCOUNTER — Encounter
Admission: EM | Disposition: A | Discharge status: Home Health | Payer: Self-pay | Source: Home / Self Care | Attending: Internal Medicine

## 2023-03-03 ENCOUNTER — Emergency Department: Payer: HMO

## 2023-03-03 ENCOUNTER — Other Ambulatory Visit: Payer: Self-pay

## 2023-03-03 ENCOUNTER — Inpatient Hospital Stay
Admission: EM | Admit: 2023-03-03 | Discharge: 2023-03-12 | DRG: 853 | Disposition: A | Discharge status: Home Health | Payer: HMO | Attending: Obstetrics and Gynecology | Admitting: Obstetrics and Gynecology

## 2023-03-03 ENCOUNTER — Ambulatory Visit: Admission: RE | Admit: 2023-03-03 | Payer: HMO | Source: Home / Self Care | Admitting: Vascular Surgery

## 2023-03-03 ENCOUNTER — Encounter: Payer: Self-pay | Admitting: Emergency Medicine

## 2023-03-03 DIAGNOSIS — F418 Other specified anxiety disorders: Secondary | ICD-10-CM | POA: Diagnosis present

## 2023-03-03 DIAGNOSIS — L97909 Non-pressure chronic ulcer of unspecified part of unspecified lower leg with unspecified severity: Secondary | ICD-10-CM | POA: Diagnosis not present

## 2023-03-03 DIAGNOSIS — D638 Anemia in other chronic diseases classified elsewhere: Secondary | ICD-10-CM | POA: Diagnosis not present

## 2023-03-03 DIAGNOSIS — U071 COVID-19: Secondary | ICD-10-CM | POA: Diagnosis present

## 2023-03-03 DIAGNOSIS — I2510 Atherosclerotic heart disease of native coronary artery without angina pectoris: Secondary | ICD-10-CM | POA: Diagnosis present

## 2023-03-03 DIAGNOSIS — Z8616 Personal history of COVID-19: Secondary | ICD-10-CM

## 2023-03-03 DIAGNOSIS — I70248 Atherosclerosis of native arteries of left leg with ulceration of other part of lower left leg: Secondary | ICD-10-CM | POA: Diagnosis present

## 2023-03-03 DIAGNOSIS — I5032 Chronic diastolic (congestive) heart failure: Secondary | ICD-10-CM | POA: Diagnosis not present

## 2023-03-03 DIAGNOSIS — Z23 Encounter for immunization: Secondary | ICD-10-CM | POA: Diagnosis not present

## 2023-03-03 DIAGNOSIS — I10 Essential (primary) hypertension: Secondary | ICD-10-CM | POA: Diagnosis present

## 2023-03-03 DIAGNOSIS — R0602 Shortness of breath: Secondary | ICD-10-CM | POA: Diagnosis not present

## 2023-03-03 DIAGNOSIS — I708 Atherosclerosis of other arteries: Secondary | ICD-10-CM | POA: Diagnosis not present

## 2023-03-03 DIAGNOSIS — Z7902 Long term (current) use of antithrombotics/antiplatelets: Secondary | ICD-10-CM

## 2023-03-03 DIAGNOSIS — Z79899 Other long term (current) drug therapy: Secondary | ICD-10-CM

## 2023-03-03 DIAGNOSIS — G8918 Other acute postprocedural pain: Secondary | ICD-10-CM | POA: Diagnosis not present

## 2023-03-03 DIAGNOSIS — J189 Pneumonia, unspecified organism: Secondary | ICD-10-CM

## 2023-03-03 DIAGNOSIS — A4189 Other specified sepsis: Secondary | ICD-10-CM | POA: Diagnosis not present

## 2023-03-03 DIAGNOSIS — I70299 Other atherosclerosis of native arteries of extremities, unspecified extremity: Secondary | ICD-10-CM

## 2023-03-03 DIAGNOSIS — R0603 Acute respiratory distress: Secondary | ICD-10-CM | POA: Diagnosis not present

## 2023-03-03 DIAGNOSIS — X58XXXA Exposure to other specified factors, initial encounter: Secondary | ICD-10-CM | POA: Diagnosis present

## 2023-03-03 DIAGNOSIS — J439 Emphysema, unspecified: Secondary | ICD-10-CM | POA: Diagnosis not present

## 2023-03-03 DIAGNOSIS — Z95828 Presence of other vascular implants and grafts: Secondary | ICD-10-CM | POA: Diagnosis not present

## 2023-03-03 DIAGNOSIS — J9621 Acute and chronic respiratory failure with hypoxia: Principal | ICD-10-CM | POA: Diagnosis present

## 2023-03-03 DIAGNOSIS — Z8249 Family history of ischemic heart disease and other diseases of the circulatory system: Secondary | ICD-10-CM

## 2023-03-03 DIAGNOSIS — K7460 Unspecified cirrhosis of liver: Secondary | ICD-10-CM | POA: Diagnosis present

## 2023-03-03 DIAGNOSIS — I110 Hypertensive heart disease with heart failure: Secondary | ICD-10-CM | POA: Diagnosis present

## 2023-03-03 DIAGNOSIS — I252 Old myocardial infarction: Secondary | ICD-10-CM

## 2023-03-03 DIAGNOSIS — F17210 Nicotine dependence, cigarettes, uncomplicated: Secondary | ICD-10-CM | POA: Diagnosis present

## 2023-03-03 DIAGNOSIS — K219 Gastro-esophageal reflux disease without esophagitis: Secondary | ICD-10-CM | POA: Diagnosis present

## 2023-03-03 DIAGNOSIS — R6521 Severe sepsis with septic shock: Secondary | ICD-10-CM | POA: Diagnosis not present

## 2023-03-03 DIAGNOSIS — Z7982 Long term (current) use of aspirin: Secondary | ICD-10-CM

## 2023-03-03 DIAGNOSIS — Z87891 Personal history of nicotine dependence: Secondary | ICD-10-CM | POA: Diagnosis not present

## 2023-03-03 DIAGNOSIS — G9341 Metabolic encephalopathy: Secondary | ICD-10-CM | POA: Diagnosis present

## 2023-03-03 DIAGNOSIS — L97529 Non-pressure chronic ulcer of other part of left foot with unspecified severity: Secondary | ICD-10-CM | POA: Diagnosis not present

## 2023-03-03 DIAGNOSIS — Z5986 Financial insecurity: Secondary | ICD-10-CM

## 2023-03-03 DIAGNOSIS — Z955 Presence of coronary angioplasty implant and graft: Secondary | ICD-10-CM

## 2023-03-03 DIAGNOSIS — I255 Ischemic cardiomyopathy: Secondary | ICD-10-CM | POA: Diagnosis present

## 2023-03-03 DIAGNOSIS — Z66 Do not resuscitate: Secondary | ICD-10-CM | POA: Diagnosis present

## 2023-03-03 DIAGNOSIS — N390 Urinary tract infection, site not specified: Secondary | ICD-10-CM | POA: Diagnosis not present

## 2023-03-03 DIAGNOSIS — M810 Age-related osteoporosis without current pathological fracture: Secondary | ICD-10-CM | POA: Diagnosis present

## 2023-03-03 DIAGNOSIS — R6520 Severe sepsis without septic shock: Secondary | ICD-10-CM | POA: Diagnosis not present

## 2023-03-03 DIAGNOSIS — R4182 Altered mental status, unspecified: Secondary | ICD-10-CM | POA: Diagnosis not present

## 2023-03-03 DIAGNOSIS — I743 Embolism and thrombosis of arteries of the lower extremities: Secondary | ICD-10-CM | POA: Diagnosis not present

## 2023-03-03 DIAGNOSIS — J441 Chronic obstructive pulmonary disease with (acute) exacerbation: Secondary | ICD-10-CM | POA: Diagnosis present

## 2023-03-03 DIAGNOSIS — Z803 Family history of malignant neoplasm of breast: Secondary | ICD-10-CM

## 2023-03-03 DIAGNOSIS — R918 Other nonspecific abnormal finding of lung field: Secondary | ICD-10-CM | POA: Diagnosis not present

## 2023-03-03 DIAGNOSIS — R000 Tachycardia, unspecified: Secondary | ICD-10-CM | POA: Diagnosis not present

## 2023-03-03 DIAGNOSIS — L97829 Non-pressure chronic ulcer of other part of left lower leg with unspecified severity: Secondary | ICD-10-CM | POA: Diagnosis not present

## 2023-03-03 DIAGNOSIS — E785 Hyperlipidemia, unspecified: Secondary | ICD-10-CM | POA: Diagnosis not present

## 2023-03-03 DIAGNOSIS — A419 Sepsis, unspecified organism: Secondary | ICD-10-CM | POA: Diagnosis present

## 2023-03-03 DIAGNOSIS — S82852A Displaced trimalleolar fracture of left lower leg, initial encounter for closed fracture: Secondary | ICD-10-CM | POA: Diagnosis not present

## 2023-03-03 DIAGNOSIS — Z833 Family history of diabetes mellitus: Secondary | ICD-10-CM

## 2023-03-03 DIAGNOSIS — L089 Local infection of the skin and subcutaneous tissue, unspecified: Secondary | ICD-10-CM | POA: Diagnosis not present

## 2023-03-03 DIAGNOSIS — S82892A Other fracture of left lower leg, initial encounter for closed fracture: Secondary | ICD-10-CM | POA: Diagnosis present

## 2023-03-03 DIAGNOSIS — I77811 Abdominal aortic ectasia: Secondary | ICD-10-CM | POA: Diagnosis not present

## 2023-03-03 DIAGNOSIS — J1282 Pneumonia due to coronavirus disease 2019: Secondary | ICD-10-CM | POA: Diagnosis not present

## 2023-03-03 DIAGNOSIS — Z9981 Dependence on supplemental oxygen: Secondary | ICD-10-CM

## 2023-03-03 DIAGNOSIS — Z9889 Other specified postprocedural states: Secondary | ICD-10-CM | POA: Diagnosis not present

## 2023-03-03 DIAGNOSIS — I70245 Atherosclerosis of native arteries of left leg with ulceration of other part of foot: Secondary | ICD-10-CM | POA: Diagnosis not present

## 2023-03-03 DIAGNOSIS — S82842A Displaced bimalleolar fracture of left lower leg, initial encounter for closed fracture: Secondary | ICD-10-CM | POA: Diagnosis not present

## 2023-03-03 DIAGNOSIS — Z7951 Long term (current) use of inhaled steroids: Secondary | ICD-10-CM

## 2023-03-03 DIAGNOSIS — J449 Chronic obstructive pulmonary disease, unspecified: Secondary | ICD-10-CM | POA: Diagnosis not present

## 2023-03-03 HISTORY — DX: Personal history of COVID-19: Z86.16

## 2023-03-03 LAB — COMPREHENSIVE METABOLIC PANEL
ALT: 15 U/L (ref 0–44)
AST: 19 U/L (ref 15–41)
Albumin: 3.3 g/dL — ABNORMAL LOW (ref 3.5–5.0)
Alkaline Phosphatase: 107 U/L (ref 38–126)
Anion gap: 9 (ref 5–15)
BUN: 21 mg/dL (ref 8–23)
CO2: 22 mmol/L (ref 22–32)
Calcium: 8.5 mg/dL — ABNORMAL LOW (ref 8.9–10.3)
Chloride: 109 mmol/L (ref 98–111)
Creatinine, Ser: 1.11 mg/dL — ABNORMAL HIGH (ref 0.44–1.00)
GFR, Estimated: 53 mL/min — ABNORMAL LOW (ref >60)
Glucose, Bld: 85 mg/dL (ref 70–99)
Potassium: 4.2 mmol/L (ref 3.5–5.1)
Sodium: 140 mmol/L (ref 135–145)
Total Bilirubin: 0.6 mg/dL (ref 0.3–1.2)
Total Protein: 6.7 g/dL (ref 6.5–8.1)

## 2023-03-03 LAB — URINALYSIS, W/ REFLEX TO CULTURE (INFECTION SUSPECTED)
Bilirubin Urine: NEGATIVE
Glucose, UA: NEGATIVE mg/dL
Ketones, ur: NEGATIVE mg/dL
Leukocytes,Ua: NEGATIVE
Nitrite: POSITIVE — AB
Protein, ur: NEGATIVE mg/dL
Specific Gravity, Urine: 1.033 — ABNORMAL HIGH (ref 1.005–1.030)
pH: 5 (ref 5.0–8.0)

## 2023-03-03 LAB — BLOOD GAS, VENOUS
Acid-base deficit: 2.6 mmol/L — ABNORMAL HIGH (ref 0.0–2.0)
Bicarbonate: 23.2 mmol/L (ref 20.0–28.0)
O2 Saturation: 87.1 %
Patient temperature: 37
pCO2, Ven: 43 mmHg — ABNORMAL LOW (ref 44–60)
pH, Ven: 7.34 (ref 7.25–7.43)
pO2, Ven: 54 mmHg — ABNORMAL HIGH (ref 32–45)

## 2023-03-03 LAB — LACTIC ACID, PLASMA
Lactic Acid, Venous: 1.8 mmol/L (ref 0.5–1.9)
Lactic Acid, Venous: 1.1 mmol/L (ref 0.5–1.9)

## 2023-03-03 LAB — CBC WITH DIFFERENTIAL/PLATELET
Abs Immature Granulocytes: 0.01 10*3/uL (ref 0.00–0.07)
Basophils Absolute: 0 10*3/uL (ref 0.0–0.1)
Basophils Relative: 1 %
Eosinophils Absolute: 0 10*3/uL (ref 0.0–0.5)
Eosinophils Relative: 0 %
HCT: 40.1 % (ref 36.0–46.0)
Hemoglobin: 12.3 g/dL (ref 12.0–15.0)
Immature Granulocytes: 0 %
Lymphocytes Relative: 5 %
Lymphs Abs: 0.4 10*3/uL — ABNORMAL LOW (ref 0.7–4.0)
MCH: 27.6 pg (ref 26.0–34.0)
MCHC: 30.7 g/dL (ref 30.0–36.0)
MCV: 89.9 fL (ref 80.0–100.0)
Monocytes Absolute: 0.4 10*3/uL (ref 0.1–1.0)
Monocytes Relative: 5 %
Neutro Abs: 7.5 10*3/uL (ref 1.7–7.7)
Neutrophils Relative %: 89 %
Platelets: 276 10*3/uL (ref 150–400)
RBC: 4.46 MIL/uL (ref 3.87–5.11)
RDW: 16.5 % — ABNORMAL HIGH (ref 11.5–15.5)
WBC: 8.4 10*3/uL (ref 4.0–10.5)
nRBC: 0 % (ref 0.0–0.2)

## 2023-03-03 LAB — RESP PANEL BY RT-PCR (RSV, FLU A&B, COVID)  RVPGX2
Influenza A by PCR: NEGATIVE
Influenza B by PCR: NEGATIVE
Resp Syncytial Virus by PCR: NEGATIVE
SARS Coronavirus 2 by RT PCR: POSITIVE — AB

## 2023-03-03 LAB — TROPONIN I (HIGH SENSITIVITY): Troponin I (High Sensitivity): 39 ng/L — ABNORMAL HIGH (ref <18)

## 2023-03-03 SURGERY — LOWER EXTREMITY ANGIOGRAPHY
Anesthesia: Moderate Sedation | Site: Leg Lower | Laterality: Left

## 2023-03-03 MED ORDER — MORPHINE SULFATE (PF) 2 MG/ML IV SOLN
2.0000 mg | INTRAVENOUS | Status: DC | PRN
  Administered 2023-03-03 – 2023-03-10 (×20): 2 mg via INTRAVENOUS
  Filled 2023-03-03 (×21): qty 1

## 2023-03-03 MED ORDER — MAGNESIUM OXIDE -MG SUPPLEMENT 400 (240 MG) MG PO TABS
400.0000 mg | ORAL_TABLET | Freq: Every day | ORAL | Status: DC
  Administered 2023-03-04 – 2023-03-12 (×8): 400 mg via ORAL
  Filled 2023-03-03 (×8): qty 1

## 2023-03-03 MED ORDER — SODIUM CHLORIDE 0.9 % IV SOLN: 2.0000 g | INTRAVENOUS | Status: DC

## 2023-03-03 MED ORDER — FENTANYL CITRATE PF 50 MCG/ML IJ SOSY
25.0000 ug | PREFILLED_SYRINGE | Freq: Once | INTRAMUSCULAR | Status: AC
  Administered 2023-03-03: 25 ug via INTRAVENOUS
  Filled 2023-03-03: qty 1

## 2023-03-03 MED ORDER — HYDROCODONE-ACETAMINOPHEN 5-325 MG PO TABS
1.0000 | ORAL_TABLET | Freq: Four times a day (QID) | ORAL | Status: DC | PRN
  Administered 2023-03-04: 1 via ORAL
  Filled 2023-03-03: qty 1

## 2023-03-03 MED ORDER — PREGABALIN 75 MG PO CAPS
150.0000 mg | ORAL_CAPSULE | Freq: Three times a day (TID) | ORAL | Status: DC
  Administered 2023-03-04 – 2023-03-12 (×25): 150 mg via ORAL
  Filled 2023-03-03 (×25): qty 2

## 2023-03-03 MED ORDER — HYDROXYZINE HCL 25 MG PO TABS
25.0000 mg | ORAL_TABLET | Freq: Three times a day (TID) | ORAL | Status: DC | PRN
  Administered 2023-03-04 – 2023-03-10 (×2): 25 mg via ORAL
  Filled 2023-03-03 (×2): qty 1

## 2023-03-03 MED ORDER — SODIUM CHLORIDE 0.9 % IV SOLN
1.0000 g | Freq: Once | INTRAVENOUS | Status: AC
  Administered 2023-03-03: 1 g via INTRAVENOUS
  Filled 2023-03-03: qty 10

## 2023-03-03 MED ORDER — SODIUM CHLORIDE 0.9 % IV SOLN
500.0000 mg | Freq: Once | INTRAVENOUS | Status: AC
  Administered 2023-03-03: 500 mg via INTRAVENOUS
  Filled 2023-03-03: qty 5

## 2023-03-03 MED ORDER — SODIUM CHLORIDE 0.9 % IV SOLN
2.0000 g | INTRAVENOUS | Status: AC
  Administered 2023-03-04 – 2023-03-07 (×4): 2 g via INTRAVENOUS
  Filled 2023-03-03 (×4): qty 20

## 2023-03-03 MED ORDER — SODIUM CHLORIDE 0.9 % IV SOLN: 500.0000 mg | INTRAVENOUS | Status: DC

## 2023-03-03 MED ORDER — ACETAMINOPHEN 325 MG PO TABS
650.0000 mg | ORAL_TABLET | Freq: Once | ORAL | Status: AC
  Administered 2023-03-03: 650 mg via ORAL
  Filled 2023-03-03: qty 2

## 2023-03-03 MED ORDER — IPRATROPIUM-ALBUTEROL 0.5-2.5 (3) MG/3ML IN SOLN
RESPIRATORY_TRACT | Status: AC
  Filled 2023-03-03: qty 3

## 2023-03-03 MED ORDER — ALBUTEROL SULFATE HFA 108 (90 BASE) MCG/ACT IN AERS
1.0000 | INHALATION_SPRAY | Freq: Four times a day (QID) | RESPIRATORY_TRACT | Status: DC | PRN
  Administered 2023-03-04: 1 via RESPIRATORY_TRACT
  Filled 2023-03-03: qty 6.7

## 2023-03-03 MED ORDER — UMECLIDINIUM BROMIDE 62.5 MCG/ACT IN AEPB
1.0000 | INHALATION_SPRAY | Freq: Every day | RESPIRATORY_TRACT | Status: DC
  Administered 2023-03-04 – 2023-03-12 (×8): 1 via RESPIRATORY_TRACT
  Filled 2023-03-03 (×2): qty 7

## 2023-03-03 MED ORDER — BUPRENORPHINE 5 MCG/HR TD PTWK
1.0000 | MEDICATED_PATCH | TRANSDERMAL | Status: DC
  Administered 2023-03-05: 1 via TRANSDERMAL

## 2023-03-03 MED ORDER — LEVOFLOXACIN IN D5W 750 MG/150ML IV SOLN: 750.0000 mg | Freq: Once | INTRAVENOUS | Status: DC

## 2023-03-03 MED ORDER — NITROGLYCERIN 0.4 MG SL SUBL
0.4000 mg | SUBLINGUAL_TABLET | SUBLINGUAL | Status: DC | PRN
  Administered 2023-03-08 – 2023-03-09 (×2): 0.4 mg via SUBLINGUAL
  Filled 2023-03-03 (×2): qty 1

## 2023-03-03 MED ORDER — NOREPINEPHRINE 4 MG/250ML-% IV SOLN
0.0000 ug/min | INTRAVENOUS | Status: DC
  Administered 2023-03-03: 1 ug/min via INTRAVENOUS

## 2023-03-03 MED ORDER — VITAMIN B-12 1000 MCG PO TABS
1000.0000 ug | ORAL_TABLET | Freq: Every day | ORAL | Status: DC
  Administered 2023-03-04 – 2023-03-12 (×8): 1000 ug via ORAL
  Filled 2023-03-03 (×5): qty 1
  Filled 2023-03-03: qty 2
  Filled 2023-03-03 (×2): qty 1

## 2023-03-03 MED ORDER — ENOXAPARIN SODIUM 40 MG/0.4ML IJ SOSY
40.0000 mg | PREFILLED_SYRINGE | INTRAMUSCULAR | Status: AC
  Administered 2023-03-04 – 2023-03-10 (×7): 40 mg via SUBCUTANEOUS
  Filled 2023-03-03 (×7): qty 0.4

## 2023-03-03 MED ORDER — TIZANIDINE HCL 2 MG PO TABS
2.0000 mg | ORAL_TABLET | Freq: Every evening | ORAL | Status: DC | PRN
  Administered 2023-03-04: 2 mg via ORAL
  Filled 2023-03-03: qty 1

## 2023-03-03 MED ORDER — MELATONIN 5 MG PO TABS
5.0000 mg | ORAL_TABLET | Freq: Every day | ORAL | Status: DC
  Administered 2023-03-03 – 2023-03-11 (×9): 5 mg via ORAL
  Filled 2023-03-03 (×9): qty 1

## 2023-03-03 MED ORDER — DEXAMETHASONE 6 MG PO TABS
6.0000 mg | ORAL_TABLET | Freq: Every day | ORAL | Status: DC
  Filled 2023-03-03: qty 1

## 2023-03-03 MED ORDER — ACETAMINOPHEN 500 MG PO TABS: 1000.0000 mg | ORAL_TABLET | Freq: Three times a day (TID) | ORAL | Status: DC

## 2023-03-03 MED ORDER — ONDANSETRON HCL 4 MG/2ML IJ SOLN: 4.0000 mg | Freq: Four times a day (QID) | INTRAMUSCULAR | Status: DC | PRN

## 2023-03-03 MED ORDER — LACTATED RINGERS IV BOLUS
1000.0000 mL | Freq: Once | INTRAVENOUS | Status: AC
  Administered 2023-03-03: 1000 mL via INTRAVENOUS

## 2023-03-03 MED ORDER — ATORVASTATIN CALCIUM 20 MG PO TABS
40.0000 mg | ORAL_TABLET | Freq: Every day | ORAL | Status: DC
  Administered 2023-03-04 – 2023-03-12 (×8): 40 mg via ORAL
  Filled 2023-03-03 (×8): qty 2

## 2023-03-03 MED ORDER — ASPIRIN 81 MG PO TBEC
81.0000 mg | DELAYED_RELEASE_TABLET | Freq: Every day | ORAL | Status: DC
  Administered 2023-03-04 – 2023-03-12 (×8): 81 mg via ORAL
  Filled 2023-03-03 (×8): qty 1

## 2023-03-03 MED ORDER — AMITRIPTYLINE HCL 50 MG PO TABS
100.0000 mg | ORAL_TABLET | Freq: Every day | ORAL | Status: DC
  Administered 2023-03-04 – 2023-03-09 (×7): 100 mg via ORAL
  Filled 2023-03-03 (×2): qty 2
  Filled 2023-03-03 (×2): qty 1
  Filled 2023-03-03 (×5): qty 2

## 2023-03-03 MED ORDER — CLOPIDOGREL BISULFATE 75 MG PO TABS
75.0000 mg | ORAL_TABLET | Freq: Every day | ORAL | Status: AC
  Administered 2023-03-04 – 2023-03-09 (×6): 75 mg via ORAL
  Filled 2023-03-03 (×6): qty 1

## 2023-03-03 MED ORDER — IPRATROPIUM-ALBUTEROL 0.5-2.5 (3) MG/3ML IN SOLN
9.0000 mL | Freq: Once | RESPIRATORY_TRACT | Status: AC
  Administered 2023-03-03: 9 mL via RESPIRATORY_TRACT
  Filled 2023-03-03: qty 6

## 2023-03-03 MED ORDER — ONDANSETRON HCL 4 MG PO TABS: 4.0000 mg | ORAL_TABLET | Freq: Four times a day (QID) | ORAL | Status: DC | PRN

## 2023-03-03 MED ORDER — SODIUM CHLORIDE 0.9 % IV SOLN
1.0000 g | Freq: Once | INTRAVENOUS | Status: AC
  Administered 2023-03-04: 1 g via INTRAVENOUS
  Filled 2023-03-03: qty 10

## 2023-03-03 MED ORDER — LACTATED RINGERS IV SOLN: INTRAVENOUS | Status: AC

## 2023-03-03 MED ORDER — IOHEXOL 350 MG/ML SOLN
75.0000 mL | Freq: Once | INTRAVENOUS | Status: AC | PRN
  Administered 2023-03-03: 75 mL via INTRAVENOUS

## 2023-03-03 MED ORDER — METHYLPREDNISOLONE SODIUM SUCC 125 MG IJ SOLR
125.0000 mg | Freq: Once | INTRAMUSCULAR | Status: AC
  Administered 2023-03-03: 125 mg via INTRAVENOUS
  Filled 2023-03-03: qty 2

## 2023-03-03 MED ORDER — ACETAMINOPHEN 325 MG PO TABS
650.0000 mg | ORAL_TABLET | Freq: Four times a day (QID) | ORAL | Status: DC | PRN
  Administered 2023-03-07: 650 mg via ORAL
  Filled 2023-03-03: qty 2

## 2023-03-03 MED ORDER — LACTATED RINGERS IV BOLUS (SEPSIS)
1000.0000 mL | Freq: Once | INTRAVENOUS | Status: AC
  Administered 2023-03-03: 1000 mL via INTRAVENOUS

## 2023-03-03 MED ORDER — GABAPENTIN 300 MG PO CAPS
600.0000 mg | ORAL_CAPSULE | Freq: Two times a day (BID) | ORAL | Status: DC
  Administered 2023-03-04 – 2023-03-12 (×17): 600 mg via ORAL
  Filled 2023-03-03 (×17): qty 2

## 2023-03-03 MED ORDER — NOREPINEPHRINE 4 MG/250ML-% IV SOLN
INTRAVENOUS | Status: AC
  Administered 2023-03-03: 2 ug/min via INTRAVENOUS
  Filled 2023-03-03: qty 250

## 2023-03-03 MED ORDER — LACTATED RINGERS IV SOLN
150.0000 mL/h | INTRAVENOUS | Status: DC
  Administered 2023-03-04 (×2): 150 mL/h via INTRAVENOUS

## 2023-03-03 MED ORDER — SODIUM CHLORIDE 0.9 % IV SOLN
500.0000 mg | INTRAVENOUS | Status: AC
  Administered 2023-03-04 – 2023-03-05 (×2): 500 mg via INTRAVENOUS
  Filled 2023-03-03 (×2): qty 5

## 2023-03-03 MED ORDER — VITAMIN E 180 MG (400 UNIT) PO CAPS
400.0000 [IU] | ORAL_CAPSULE | Freq: Every day | ORAL | Status: DC
  Administered 2023-03-04 – 2023-03-12 (×8): 400 [IU] via ORAL
  Filled 2023-03-03 (×9): qty 1

## 2023-03-03 MED ORDER — FLUTICASONE FUROATE-VILANTEROL 100-25 MCG/ACT IN AEPB
1.0000 | INHALATION_SPRAY | Freq: Every day | RESPIRATORY_TRACT | Status: DC
  Administered 2023-03-04 – 2023-03-12 (×8): 1 via RESPIRATORY_TRACT
  Filled 2023-03-03 (×2): qty 28

## 2023-03-03 MED ORDER — ACETAMINOPHEN 650 MG RE SUPP: 650.0000 mg | Freq: Four times a day (QID) | RECTAL | Status: DC | PRN

## 2023-03-03 NOTE — ED Notes (Signed)
Pt used bed pan. Large bm.

## 2023-03-03 NOTE — Progress Notes (Signed)
Was made aware pt in ER.  Was scheduled for angio today and ORIF of ankle fx tomorrow.  ORIF cancelled and will have to be rescheduled once pt stable.  Should remain NWB to ankle and maintain boot at all times.

## 2023-03-03 NOTE — ED Notes (Signed)
Patient's O2 sats sitting at 85% on 5L [place]. MD Ray notified. Patient immediately placed on non-rebreather 7L, bringing O2 sats to 90%. Patient placed on HFNC 7L per MD Ray orders. Patient currently sitting at 93% on HFNC.

## 2023-03-03 NOTE — Consult Note (Signed)
Hospital Consult    Reason for Consult:  Evaluate for Left Lower Extremity Angiogram due to confusion.  Requesting Physician:  Dr Loleta Dicker MD MRN #:  [id number]  History of Present Illness: This is a 72 y.o. female who presents to [facility] emergency department this morning confused with oxygen saturations in the 70s.  Patient was scheduled to have a left lower extremity angiogram today but was taken via EMS to the emergency room due to this confusion.  Currently the patient has being worked up by the emergency room.  At this time we will cancel the patient's left lower extremity angiogram until further stabilized and confusion has resolved.  If the patient's symptoms resolve this afternoon we will gladly take to the vascular lab to complete her left lower extremity angiogram.  Past Medical History:  Diagnosis Date   (HFpEF) heart failure with preserved ejection fraction (HCC) 06/07/2004   a.) TTE 06/07/2004: EF 50-55%; inf HK. b.) TTE 04/17/2018: EF 45%; GLS -14.6%; inferoseptal and inferoposterior HK; sev LA enlargement; mild PR, mod TR, sev MR; G2DD. c.) TTE 12/05/2018: EF >55%, mild LVH; mild MR. d.) TTE 12/18/2020: EF >55%; mild LA enlargement; triv TR/PR, mod MR; G1DD; e.) TTE 07/13/2022: EF 55-60%, no RWMAs, mild MR;  f.) TTE 02/02/2023: EF 60-65%, no RMWas, triv MR, G1DD   Acute metabolic encephalopathy    Acute respiratory failure with hypoxia (HCC)    Anemia of chronic disease    Anxiety    Aortic atherosclerosis (HCC)    Asthma    CAD (coronary artery disease) 06/08/2004   a.) NSTEMI 06/05/2004. b.) LHC 06/08/2004: EF 70%; 50% mLAD, 50% dLAD, 99% OM3, 50% pRCA, 50% mRCA; PCI to OM3 placing a DES x 1 (unknown type). c.) Lexi scan 09/14/2021: normal LV function with no ischemia or scar.   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    a.) followed by pain management; on transdermal buprenorphine   COPD (chronic obstructive pulmonary disease) (HCC)    Depression     Diverticulosis    DOE (dyspnea on exertion)    Dysphagia    Empyema of right pleural space (HCC) 02/16/2018   Hepatic cirrhosis (HCC)    HLD (hyperlipidemia)    Hypertension    IDA (iron deficiency anemia)    Ischemic cardiomyopathy    a.) TTE 06/07/2004: 50-55%; b.) TTE 04/17/2018: EF 45%; c.) TTE 12/05/2018: >55%; d.) TTE 12/18/2020: >55%; e.) MV 09/14/2021: EF 64%; f.) TTE 07/13/2022: EF 55-60%; g.) TTE 02/02/2023: 60-65%   Long term current use of aspirin    Long term current use of clopidogrel    NSTEMI (non-ST elevated myocardial infarction) (HCC) 06/05/2004   a.)  PCI on 06/08/2004 placing a DES x1 (unknown type) to 99% lesion in OM3.   Osteopenia    Osteoporosis    Peripheral vascular disease (HCC)    a.) s/p BILATERAL femoral endarterectomies and open angioplasty with BILATERAL EIA stenting 09/16/2021   Pneumonia    PONV (postoperative nausea and vomiting)     Past Surgical History:  Procedure Laterality Date   COLONOSCOPY WITH PROPOFOL N/A 04/12/2017   Procedure: COLONOSCOPY WITH PROPOFOL;  Surgeon: Christena Deem, MD;  Location: [facility] ENDOSCOPY;  Service: Endoscopy;  Laterality: N/A;   CORONARY ANGIOPLASTY WITH STENT PLACEMENT Left 06/08/2004   Procedure: CORONARY ANGIOPLASTY WITH STENT PLACEMENT; Location: [facility]; Surgeon: Rudean Hitt, MD   ENDARTERECTOMY FEMORAL Bilateral 09/16/2021   Procedure: ENDARTERECTOMY FEMORAL;  Surgeon: Annice Needy, MD;  Location:  [facility] ORS;  Service: Vascular;  Laterality: Bilateral;   ENDOBRONCHIAL ULTRASOUND N/A 11/28/2017   Procedure: ENDOBRONCHIAL ULTRASOUND;  Surgeon: Shane Crutch, MD;  Location: [facility] ORS;  Service: Pulmonary;  Laterality: N/A;   INSERTION OF ILIAC STENT Bilateral 09/16/2021   Procedure: INSERTION OF ILIAC STENT;  Surgeon: Annice Needy, MD;  Location: [facility] ORS;  Service: Vascular;  Laterality: Bilateral;   LOWER EXTREMITY ANGIOGRAPHY Right 08/12/2021   Procedure: Lower Extremity Angiography;  Surgeon: Annice Needy, MD;  Location: [facility] INVASIVE CV LAB;  Service: Cardiovascular;  Laterality: Right;   PARTIAL HYSTERECTOMY N/A    REDUCTION MAMMAPLASTY     VIDEO ASSISTED THORACOSCOPY (VATS)/THOROCOTOMY Right 03/2018   Procedure: VIDEO ASSISTED THORACOSCOPY (VATS)/THOROCOTOMY (partial decortication, loculated empyema); Location: [facility]    Allergies  Allergen Reactions   Penicillins Other (See Comments)    Tolerated cephalosporins with no significant documented ADRs -3rd generation: CEFTRIAXONE: 04/14/2018, 05/26/2021 and CEFDINIR: 04/14/2018  -4th generation CEFEPIME: 04/18/2018  Has tolerated amoxicillin-clavulanate in the past.   PCN rxn causing immediate rash, facial/tongue/throat swelling, SOB or lightheadedness with hypotension: Yes  No reaction causing severe rash involving mucus membranes or skin necrosis, reaction that required hospitalization, or reaction occurring within last 10 years.    Prior to Admission medications   Medication Sig Start Date End Date Taking? Authorizing Provider  acetaminophen (TYLENOL) 500 MG tablet Take 2 tablets (1,000 mg total) by mouth every 8 (eight) hours. 02/04/23   Esaw Grandchild A, DO  albuterol (PROVENTIL HFA;VENTOLIN HFA) 108 (90 Base) MCG/ACT inhaler Inhale into the lungs every 6 (six) hours as needed for wheezing or shortness of breath.    [provider]  amitriptyline (ELAVIL) 100 MG tablet Take 100 mg by mouth at bedtime. 06/02/22 06/02/23  [provider]  aspirin EC 81 MG tablet Take 1 tablet (81 mg total) by mouth daily. 08/12/21   Annice Needy, MD  atorvastatin (LIPITOR) 40 MG tablet Take 40 mg by mouth daily. 05/18/21   [provider]  buprenorphine (BUTRANS) 5 MCG/HR PTWK Place 1 patch onto the skin once a week for 28 days. Patient taking differently: Place 1 patch onto the skin once a week. Replaces on Saturday 02/08/23 03/08/23  Edward Jolly, MD  buprenorphine (BUTRANS) 7.5 MCG/HR Place 1 patch onto the skin once a  week for 28 days. Patient not taking: Reported on 03/01/2023 02/08/23 03/08/23  Edward Jolly, MD  clopidogrel (PLAVIX) 75 MG tablet TAKE 1 TABLET BY MOUTH DAILY AT 6AM Patient taking differently: Take 75 mg by mouth daily. 04/30/22   Georgiana Spinner, NP  gabapentin (NEURONTIN) 300 MG capsule Take 2 capsules (600 mg total) by mouth at bedtime. Patient taking differently: Take 600 mg by mouth 2 (two) times daily. One capsule in the morning, 2 capsules at bedtime 10/29/21   Georgiana Spinner, NP  hydrOXYzine (ATARAX) 25 MG tablet TAKE ONE (1) TABLET BY MOUTH TWO TIMES PER DAY AS NEEDED FOR ITCHING 01/26/23   [provider]  ipratropium-albuterol (DUONEB) 0.5-2.5 (3) MG/3ML SOLN Take 3 mLs by nebulization every 6 (six) hours as needed. 02/04/23 03/06/23  Esaw Grandchild A, DO  Magnesium 400 MG CAPS Take 1 tablet by mouth daily.    [provider]  nitroGLYCERIN (NITROSTAT) 0.4 MG SL tablet Place under the tongue. 01/20/23 01/20/24  [provider]  oxyCODONE (OXY IR/ROXICODONE) 5 MG immediate release tablet Take 1-2 tablets (5-10 mg total) by mouth every 4 (four) hours as needed for severe pain or  moderate pain. 02/04/23   Pennie Banter, DO  pregabalin (LYRICA) 150 MG capsule Take 150 mg by mouth 3 (three) times daily. 07/05/22   [provider]  senna-docusate (SENOKOT-S) 8.6-50 MG tablet Take 2 tablets by mouth at bedtime. Patient not taking: Reported on 03/01/2023 02/04/23   Esaw Grandchild A, DO  tiZANidine (ZANAFLEX) 2 MG tablet Take 1-2 tablets (2-4 mg total) by mouth at bedtime as needed for muscle spasms. 01/27/23 03/28/23  Edward Jolly, MD  torsemide (DEMADEX) 20 MG tablet Take 1 tablet (20 mg total) by mouth daily as needed (weight gain or swelling). 02/04/23   Esaw Grandchild A, DO  TRELEGY ELLIPTA 100-62.5-25 MCG/ACT AEPB Inhale 1 puff into the lungs daily. 05/30/21   Enedina Finner, MD  vitamin B-12 (CYANOCOBALAMIN) 1000 MCG tablet Take 1,000 mcg by mouth daily.    [provider]  Vitamin E 268 MG (400 UNIT) CAPS Take 400 Units by mouth daily.    [provider]    Social History   Socioeconomic History   Marital status: Divorced    Spouse name: Not on file   Number of children: Not on file   Years of education: Not on file   Highest education level: Not on file  Occupational History   Not on file  Tobacco Use   Smoking status: Former    Current packs/day: 0.50    Average packs/day: 0.5 packs/day for 20.0 years (10.0 ttl pk-yrs)    Types: Cigarettes   Smokeless tobacco: Never  Vaping Use   Vaping status: Some Days   Substances: Nicotine, Flavoring  Substance and Sexual Activity   Alcohol use: Never    Alcohol/week: 3.0 standard drinks of alcohol    Types: 1 Glasses of wine, 2 Cans of beer per week    Comment: rarely   Drug use: Never   Sexual activity: Not Currently  Other Topics Concern   Not on file  Social History Narrative   Not on file   Social Determinants of Health   Financial Resource Strain: High Risk (02/16/2018)   Overall Financial Resource Strain (CARDIA)    Difficulty of Paying Living Expenses: Very hard  Food Insecurity: No Food Insecurity (01/31/2023)   Hunger Vital Sign    Worried About Running Out of Food in the Last Year: Never true    Ran Out of Food in the Last Year: Never true  Transportation Needs: No Transportation Needs (01/31/2023)   PRAPARE - Administrator, Civil Service (Medical): No    Lack of Transportation (Non-Medical): No  Physical Activity: Inactive (02/16/2018)   Exercise Vital Sign    Days of Exercise per Week: 0 days    Minutes of Exercise per Session: 0 min  Stress: Stress Concern Present (02/16/2018)   [employer] of Occupational Health - Occupational Stress Questionnaire    Feeling of Stress : Rather much  Social Connections: Unknown (02/16/2018)   Social Connection and Isolation Panel [NHANES]    Frequency of Communication with Friends and Family: More than three  times a week    Frequency of Social Gatherings with Friends and Family: More than three times a week    Attends Religious Services: Patient declined    Database administrator or Organizations: Patient declined    Attends Banker Meetings: Patient declined    Marital Status: Divorced  Intimate Partner Violence: Not At Risk (01/31/2023)   Humiliation, Afraid, Rape, and Kick questionnaire    Fear of  Current or Ex-Partner: No    Emotionally Abused: No    Physically Abused: No    Sexually Abused: No     Family History  Problem Relation Age of Onset   Breast cancer Mother 98   Hypertension Mother    Dementia Mother    Breast cancer Maternal Aunt 81   Breast cancer Other 35   Hypertension Father    Heart disease Father    Diabetes Father    Diabetes Sister     ROS: Otherwise negative unless mentioned in HPI  Physical Examination  Vitals:   03/03/23 1035 03/03/23 1047  BP:  (!) 86/65  Pulse:  (!) 104  Resp:  20  Temp:  98.4 F (36.9 C)  SpO2: 93% 95%   Body mass index is 27.44 kg/m.  General:  WDWN in NAD Gait: Not observed HENT: WNL, normocephalic Pulmonary: normal non-labored breathing, without Rales, rhonchi,  wheezing Cardiac: regular, tachycardia at 120, without  Murmurs, rubs or gallops; without carotid bruits Abdomen: Positive bowel sounds throughout soft, NT/ND, no masses Skin: without rashes Vascular Exam/Pulses: Weak palpable pulses throughout with exception to left lower extremity.  Unable to palpate left lower extremity pulses. Extremities: without ischemic changes, without Gangrene , without cellulitis; without open wounds;  Musculoskeletal: no muscle wasting or atrophy  Neurologic: A&O X 0; Patient is in respiratory distress and confused. Unable to follow commands or answer questions appropriately.  Psychiatric:  The pt is currently confused and in respiratory distress. Flat affect. Lymph:  Unremarkable  CBC    Component Value Date/Time    WBC 8.4 03/03/2023 0932   RBC 4.46 03/03/2023 0932   HGB 12.3 03/03/2023 0932   HGB 13.6 10/31/2013 1541   HCT 40.1 03/03/2023 0932   HCT 41.4 10/31/2013 1541   PLT 276 03/03/2023 0932   PLT 272 10/31/2013 1541   MCV 89.9 03/03/2023 0932   MCV 91 10/31/2013 1541   MCH 27.6 03/03/2023 0932   MCHC 30.7 03/03/2023 0932   RDW 16.5 (H) 03/03/2023 0932   RDW 13.5 10/31/2013 1541   LYMPHSABS 0.4 (L) 03/03/2023 0932   LYMPHSABS 2.0 10/31/2013 1541   MONOABS 0.4 03/03/2023 0932   MONOABS 0.3 10/31/2013 1541   EOSABS 0.0 03/03/2023 0932   EOSABS 0.2 10/31/2013 1541   BASOSABS 0.0 03/03/2023 0932   BASOSABS 0.1 10/31/2013 1541    BMET    Component Value Date/Time   NA 140 03/03/2023 0932   NA 138 10/31/2013 1541   K 4.2 03/03/2023 0932   K 4.5 10/31/2013 1541   CL 109 03/03/2023 0932   CL 101 10/31/2013 1541   CO2 22 03/03/2023 0932   CO2 29 10/31/2013 1541   GLUCOSE 85 03/03/2023 0932   GLUCOSE 96 10/31/2013 1541   BUN 21 03/03/2023 0932   BUN 10 10/31/2013 1541   CREATININE 1.11 (H) 03/03/2023 0932   CREATININE 0.91 10/31/2013 1541   CALCIUM 8.5 (L) 03/03/2023 0932   CALCIUM 9.1 10/31/2013 1541   GFRNONAA 53 (L) 03/03/2023 0932   GFRNONAA >60 10/31/2013 1541   GFRAA >60 04/13/2018 1030   GFRAA >60 10/31/2013 1541    COAGS: Lab Results  Component Value Date   INR 1.1 07/13/2022   INR 1.1 05/26/2021   INR 1.21 04/12/2018     Non-Invasive Vascular Imaging:   CTA of the chest with PE protocol ordered and completed awaiting results. CT of the head ordered and completed awaiting results.  Statin:  Yes.   Beta Blocker:  No. Aspirin:  Yes.   ACEI:  No. ARB:  No. CCB use:  No Other antiplatelets/anticoagulants:  Yes.   Plavix 75 mg Daily    ASSESSMENT/PLAN: This is a 72 y.o. female who presents to [facility] emergency department in respiratory distress and confused.  Patient was scheduled for a left lower extremity angiogram with vascular surgery this morning but due  to the patient's current condition is placed on hold.  Patient is being worked up for possible pulmonary embolism at this time.  If the patient's symptoms resolve we may consider the left lower extremity angiogram this afternoon.  The patient is positive for pulmonary embolisms we will discuss the possibility of a pulmonary thrombectomy later this afternoon.   -I discussed the plan in detail with Dr. Festus Barren MD and he agrees with the plan.   Marcie Bal [facility] 03/03/2023 11:24 AM

## 2023-03-03 NOTE — ED Notes (Signed)
Pt. Placed on and then taken off bedpan, pt. Voided urine, post void bladder scan completed, urine noted in bladder. Peri care provided.

## 2023-03-03 NOTE — H&P (Signed)
History and Physical    Patient: Wendy Arias QMV:[id number] DOB: May 01, 1951 DOA: 03/03/2023 DOS: the patient was seen and examined on 03/03/2023 PCP: Wilford Corner, PA-C  Patient coming from: Home  Chief Complaint:  Chief Complaint  Patient presents with   Altered Mental Status   HPI: Wendy Arias is a 72 y.o. female with medical history significant of ischemic cardiomyopathy with preserved ejection fraction, COPD, depression, hepatic cirrhosis, hyperlipidemia, essential hypertension, iron deficiency anemia, depression with anxiety, osteoporosis, left ankle fracture recently who was brought to the ER with altered mental status.  Patient is on home O2 at 2 L.  In the ER however she was found to be confused.  She was scheduled to have an angiogram with vascular surgery this morning.  The daughter found her to be pale and hypoxic.  Patient also has recent left ankle fracture trimalleolar nature and was supposed to have surgery tomorrow.  While in the ER she was found to be hypoxic.  Patient also meets sepsis criteria with heart rate leukocytosis and fever.  She she turned out to have COVID-19 positive test results, also CTA showing pneumonia and urinalysis consistent with UTI.  Patient being admitted with sepsis due to pneumonia and UTI and most likely secondary to COVID-19 infection.  Patient was hypotensive initially requiring Levophed.  She has been titrated off Levophed at the moment and maintaining systolic blood pressure over 100 and MAP of more than 65.  ICU was initially consulted but now declined admission and return back to hospitalist admit.  Review of Systems: As mentioned in the history of present illness. All other systems reviewed and are negative. Past Medical History:  Diagnosis Date   (HFpEF) heart failure with preserved ejection fraction (HCC) 06/07/2004   a.) TTE 06/07/2004: EF 50-55%; inf HK. b.) TTE 04/17/2018: EF 45%; GLS -14.6%; inferoseptal and inferoposterior HK; sev  LA enlargement; mild PR, mod TR, sev MR; G2DD. c.) TTE 12/05/2018: EF >55%, mild LVH; mild MR. d.) TTE 12/18/2020: EF >55%; mild LA enlargement; triv TR/PR, mod MR; G1DD; e.) TTE 07/13/2022: EF 55-60%, no RWMAs, mild MR;  f.) TTE 02/02/2023: EF 60-65%, no RMWas, triv MR, G1DD   Acute metabolic encephalopathy    Acute respiratory failure with hypoxia (HCC)    Anemia of chronic disease    Anxiety    Aortic atherosclerosis (HCC)    Asthma    CAD (coronary artery disease) 06/08/2004   a.) NSTEMI 06/05/2004. b.) LHC 06/08/2004: EF 70%; 50% mLAD, 50% dLAD, 99% OM3, 50% pRCA, 50% mRCA; PCI to OM3 placing a DES x 1 (unknown type). c.) Lexi scan 09/14/2021: normal LV function with no ischemia or scar.   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    a.) followed by pain management; on transdermal buprenorphine   COPD (chronic obstructive pulmonary disease) (HCC)    Depression    Diverticulosis    DOE (dyspnea on exertion)    Dysphagia    Empyema of right pleural space (HCC) 02/16/2018   Hepatic cirrhosis (HCC)    History of 2019 novel coronavirus disease (COVID-19) 03/03/2023   HLD (hyperlipidemia)    Hypertension    IDA (iron deficiency anemia)    Infrarenal abdominal aortic aneurysm (AAA) without rupture (HCC) 04/11/2018   a.) CT AP 04/11/2018: 2.5 cm; b.) CT AP 01/05/2022: 2.9 cm; c.) CTA chest 03/03/2023: 2.9 cm   Ischemic cardiomyopathy    a.) TTE 06/07/2004: 50-55%; b.) TTE 04/17/2018: EF 45%; c.) TTE  12/05/2018: >55%; d.) TTE 12/18/2020: >55%; e.) MV 09/14/2021: EF 64%; f.) TTE 07/13/2022: EF 55-60%; g.) TTE 02/02/2023: 60-65%   Long term current use of aspirin    Long term current use of clopidogrel    NSTEMI (non-ST elevated myocardial infarction) (HCC) 06/05/2004   a.)  PCI on 06/08/2004 placing a DES x1 (unknown type) to 99% lesion in OM3.   Osteopenia    Osteoporosis    Peripheral vascular disease (HCC)    a.) s/p BILATERAL femoral endarterectomies and open  angioplasty with BILATERAL EIA stenting 09/16/2021   Pneumonia    PONV (postoperative nausea and vomiting)    Past Surgical History:  Procedure Laterality Date   COLONOSCOPY WITH PROPOFOL N/A 04/12/2017   Procedure: COLONOSCOPY WITH PROPOFOL;  Surgeon: Christena Deem, MD;  Location: [facility] ENDOSCOPY;  Service: Endoscopy;  Laterality: N/A;   CORONARY ANGIOPLASTY WITH STENT PLACEMENT Left 06/08/2004   Procedure: CORONARY ANGIOPLASTY WITH STENT PLACEMENT; Location: [facility]; Surgeon: Rudean Hitt, MD   ENDARTERECTOMY FEMORAL Bilateral 09/16/2021   Procedure: ENDARTERECTOMY FEMORAL;  Surgeon: Annice Needy, MD;  Location: [facility] ORS;  Service: Vascular;  Laterality: Bilateral;   ENDOBRONCHIAL ULTRASOUND N/A 11/28/2017   Procedure: ENDOBRONCHIAL ULTRASOUND;  Surgeon: Shane Crutch, MD;  Location: [facility] ORS;  Service: Pulmonary;  Laterality: N/A;   INSERTION OF ILIAC STENT Bilateral 09/16/2021   Procedure: INSERTION OF ILIAC STENT;  Surgeon: Annice Needy, MD;  Location: [facility] ORS;  Service: Vascular;  Laterality: Bilateral;   LOWER EXTREMITY ANGIOGRAPHY Right 08/12/2021   Procedure: Lower Extremity Angiography;  Surgeon: Annice Needy, MD;  Location: [facility] INVASIVE CV LAB;  Service: Cardiovascular;  Laterality: Right;   PARTIAL HYSTERECTOMY N/A    REDUCTION MAMMAPLASTY     VIDEO ASSISTED THORACOSCOPY (VATS)/THOROCOTOMY Right 03/2018   Procedure: VIDEO ASSISTED THORACOSCOPY (VATS)/THOROCOTOMY (partial decortication, loculated empyema); Location: [facility]   Social History:  reports that she has quit smoking. Her smoking use included cigarettes. She has a 10 pack-year smoking history. She has never used smokeless tobacco. She reports that she does not drink alcohol and does not use drugs.  Allergies  Allergen Reactions   Penicillins Other (See Comments)    Tolerated cephalosporins with no significant documented ADRs -3rd generation: CEFTRIAXONE: 04/14/2018, 05/26/2021 and CEFDINIR: 04/14/2018  -4th  generation CEFEPIME: 04/18/2018  Has tolerated amoxicillin-clavulanate in the past.   PCN rxn causing immediate rash, facial/tongue/throat swelling, SOB or lightheadedness with hypotension: Yes  No reaction causing severe rash involving mucus membranes or skin necrosis, reaction that required hospitalization, or reaction occurring within last 10 years.    Family History  Problem Relation Age of Onset   Breast cancer Mother 53   Hypertension Mother    Dementia Mother    Breast cancer Maternal Aunt 70   Breast cancer Other 35   Hypertension Father    Heart disease Father    Diabetes Father    Diabetes Sister     Prior to Admission medications   Medication Sig Start Date End Date Taking? Authorizing Provider  acetaminophen (TYLENOL) 500 MG tablet Take 2 tablets (1,000 mg total) by mouth every 8 (eight) hours. 02/04/23  Yes Esaw Grandchild A, DO  albuterol (PROVENTIL HFA;VENTOLIN HFA) 108 (90 Base) MCG/ACT inhaler Inhale into the lungs every 6 (six) hours as needed for wheezing or shortness of breath.   Yes [provider]  amitriptyline (ELAVIL) 100 MG tablet Take 100 mg by mouth at bedtime. 06/02/22 06/02/23 Yes [provider]  aspirin EC 81 MG tablet  Take 1 tablet (81 mg total) by mouth daily. 08/12/21  Yes Dew, Marlow Baars, MD  atorvastatin (LIPITOR) 40 MG tablet Take 40 mg by mouth daily. 05/18/21  Yes [provider]  buprenorphine (BUTRANS) 5 MCG/HR PTWK Place 1 patch onto the skin once a week for 28 days. Patient taking differently: Place 1 patch onto the skin once a week. Replaces on Saturday 02/08/23 03/08/23 Yes Edward Jolly, MD  clopidogrel (PLAVIX) 75 MG tablet TAKE 1 TABLET BY MOUTH DAILY AT 6AM Patient taking differently: Take 75 mg by mouth daily. 04/30/22  Yes Georgiana Spinner, NP  gabapentin (NEURONTIN) 300 MG capsule Take 2 capsules (600 mg total) by mouth at bedtime. Patient taking differently: Take 600 mg by mouth 2 (two) times daily. One capsule in  the morning, 2 capsules at bedtime 10/29/21  Yes Georgiana Spinner, NP  HYDROcodone-acetaminophen (NORCO/VICODIN) 5-325 MG tablet Take 1-2 tablets by mouth every 6 (six) hours as needed for moderate pain. 02/24/23  Yes [provider]  ipratropium-albuterol (DUONEB) 0.5-2.5 (3) MG/3ML SOLN Take 3 mLs by nebulization every 6 (six) hours as needed. 02/04/23 03/06/23 Yes Esaw Grandchild A, DO  Magnesium 400 MG CAPS Take 1 tablet by mouth daily.   Yes [provider]  nitroGLYCERIN (NITROSTAT) 0.4 MG SL tablet Place under the tongue. 01/20/23 01/20/24 Yes [provider]  pregabalin (LYRICA) 150 MG capsule Take 150 mg by mouth 3 (three) times daily. 07/05/22  Yes [provider]  tiZANidine (ZANAFLEX) 2 MG tablet Take 1-2 tablets (2-4 mg total) by mouth at bedtime as needed for muscle spasms. 01/27/23 03/28/23 Yes Edward Jolly, MD  torsemide (DEMADEX) 20 MG tablet Take 1 tablet (20 mg total) by mouth daily as needed (weight gain or swelling). 02/04/23  Yes Esaw Grandchild A, DO  vitamin B-12 (CYANOCOBALAMIN) 1000 MCG tablet Take 1,000 mcg by mouth daily.   Yes [provider]  Vitamin E 268 MG (400 UNIT) CAPS Take 400 Units by mouth daily.   Yes [provider]  buprenorphine (BUTRANS) 7.5 MCG/HR Place 1 patch onto the skin once a week for 28 days. Patient not taking: Reported on 03/01/2023 02/08/23 03/08/23  Edward Jolly, MD  hydrOXYzine (ATARAX) 25 MG tablet TAKE ONE (1) TABLET BY MOUTH TWO TIMES PER DAY AS NEEDED FOR ITCHING 01/26/23   [provider]  oxyCODONE (OXY IR/ROXICODONE) 5 MG immediate release tablet Take 1-2 tablets (5-10 mg total) by mouth every 4 (four) hours as needed for severe pain or moderate pain. Patient not taking: Reported on 03/03/2023 02/04/23   Esaw Grandchild A, DO  senna-docusate (SENOKOT-S) 8.6-50 MG tablet Take 2 tablets by mouth at bedtime. Patient not taking: Reported on 03/01/2023 02/04/23   Esaw Grandchild A, DO  TRELEGY ELLIPTA  100-62.5-25 MCG/ACT AEPB Inhale 1 puff into the lungs daily. 05/30/21   Enedina Finner, MD    Physical Exam: Vitals:   03/03/23 1900 03/03/23 1915 03/03/23 1930 03/03/23 2000  BP: 116/80 99/66 113/73 112/74  Pulse: 85 86 87 89  Resp: (!) 22 (!) 27 (!) 23 (!) 24  Temp:      TempSrc:      SpO2: 97% 97% 97% 95%  Weight:      Height:       Constitutional: Confused, acutely ill looking, no distress NAD, calm, comfortable Eyes: PERRL, lids and conjunctivae normal ENMT: Mucous membranes are dry. Posterior pharynx clear of any exudate or lesions.Normal dentition.  Neck: normal, supple, no masses, no thyromegaly Respiratory:  clear to auscultation bilaterally, no wheezing, no crackles. Normal respiratory effort. No accessory muscle use.  Cardiovascular: Regular rate and rhythm, no murmurs / rubs / gallops. No extremity edema. 2+ pedal pulses. No carotid bruits.  Abdomen: no tenderness, no masses palpated. No hepatosplenomegaly. Bowel sounds positive.  Musculoskeletal: Left ankle in a cast.  Appears to have fracture.  Swollen and tender  skin: no rashes, lesions, ulcers. No induration Neurologic: CN 2-12 grossly intact. Sensation intact, DTR normal. Strength 5/5 in all 4.  Psychiatric: Confused.. Alert and oriented x 3. Normal mood  Data Reviewed:  Creatinine 1.1 calcium 8.5.  The rest of the labs are within normal.  COVID-19 is positive urinalysis positive for nitrite leukocytes WBC 6-10.  CT angio of the chest shows no PE but is consolidative opacity left lower lobe consistent with pneumonia and some trace pleural effusion.  Head CT without contrast showed no acute findings.  Chest x-ray showed left lobar pneumonia  Assessment and Plan:  #1 severe sepsis secondary to pneumonia, COVID-19 and UTI: Patient will be admitted and empirically started on Rocephin and Zithromax for both pneumonia and UTI.  Blood cultures and urine cultures obtained.  Other supportive care.  Follow the sepsis protocol.  So  far lactic acid is only 1.  #2 acute metabolic encephalopathy: Patient is slowly improving.  She is more awake and alert.  Beginning to communicate.  #3 COVID-19 infection: Initiate dexamethasone.  Antibiotics already initiated.  Patient may be a candidate for remdesivir with hypoxia now requiring up to 7 L of oxygen per minute.  This could also be exclusively due to the lobar pneumonia.  Will continue other supportive care.  #4 left trimalleolar fracture: Orthopedics has deferred surgery now with new findings.  When stable patient will have surgical repair which was initially scheduled for tomorrow.  #5 peripheral vascular disease: Suspected.  Patient was to have angiogram of the left lower extremity today but this was also postponed.  He will be done at a later date.  #6 coronary artery disease: Stable at this point.  Continue to monitor.  #7 COPD with mild exacerbation: Continue breathing treatment with steroids and antibiotics  #8 ischemic cardiomyopathy: Diastolic dysfunction.  Will be careful with fluids  #9 essential hypertension: Hold blood pressure medications due to hypotension that requires Levophed.  Resume later when stable  10 hyperlipidemia: Continue statin    Advance Care Planning:   Code Status: Full Code   Consults: Vascular surgery Dr. Debroah Loop, Dr. Ether Griffins podiatry and critical care medicine  Family Communication: No family at bedside  Severity of Illness: The appropriate patient status for this patient is INPATIENT. Inpatient status is judged to be reasonable and necessary in order to provide the required intensity of service to ensure the patient's safety. The patient's presenting symptoms, physical exam findings, and initial radiographic and laboratory data in the context of their chronic comorbidities is felt to place them at high risk for further clinical deterioration. Furthermore, it is not anticipated that the patient will be medically stable for discharge from the  hospital within 2 midnights of admission.   * I certify that at the point of admission it is my clinical judgment that the patient will require inpatient hospital care spanning beyond 2 midnights from the point of admission due to high intensity of service, high risk for further deterioration and high frequency of surveillance required.*  AuthorLonia Blood, MD 03/03/2023 10:52 PM  For on call review [email or web address].

## 2023-03-03 NOTE — ED Notes (Addendum)
Patient's BP decreasing. BP at 1031 is 73/45. BP at 1034 is 61/48. MD Ray notified. Patient asymptomatic at this time. Patient placed in trendelenburg with no signs of improvement. MD Ray placing orders at this time.

## 2023-03-03 NOTE — Progress Notes (Signed)
PHARMACY -  BRIEF ANTIBIOTIC NOTE   Pharmacy has received consult(s) for levofloxacin from an ED provider.    Per consult: Pharmacist to investigate beta-lactam allergy. If history of intolerance, mild allergy, or documented history of use of cephalosporins, pharmacy can adjust levofloxacin to ceftriaxone and azithromycin.   The patient's profile has been reviewed for ht/wt/allergies/indication/available labs.  Patient has tolerated cephalosporins in the past.   One time order(s) placed for Ceftriaxone and azithromycin   Further antibiotics/pharmacy consults should be ordered by admitting physician if indicated.                       Thank you,  Gardner Candle, PharmD, BCPS Clinical Pharmacist 03/03/2023 9:47 AM

## 2023-03-03 NOTE — Sepsis Progress Note (Signed)
Elink monitoring for the code sepsis protocol.  

## 2023-03-03 NOTE — ED Notes (Signed)
ICU NP Annabelle Harman at bedside.

## 2023-03-03 NOTE — Progress Notes (Signed)
CODE SEPSIS - PHARMACY COMMUNICATION  **Broad Spectrum Antibiotics should be administered within 1 hour of Sepsis diagnosis**  Time Code Sepsis Called/Page Received: 0940  Antibiotics Ordered: Azithromycin and Ceftriaxone   Time of 1st antibiotic administration: 0953  Additional action taken by pharmacy: N/A  If necessary, Name of Provider/Nurse Contacted: N/A   Gardner Candle, PharmD, BCPS Clinical Pharmacist 03/03/2023 9:44 AM

## 2023-03-03 NOTE — ED Provider Notes (Addendum)
[facility] Provider Note    Event Date/Time   First MD Initiated Contact with Patient 03/03/23 0915     (approximate)   History   Altered Mental Status   HPI  Wendy Arias is a 72 year old female with history of COPD, CHF, CAD, HTN presenting to the emergency department for evaluation of altered mental status.  Patient was to have an angiogram with vascular surgery this morning.  She was found by her daughter confused, pale, hypoxic.  She was not on oxygen, but only uses 2 L as needed.  Is also scheduled to have a ankle surgery for trimalleolar fracture tomorrow.  Patient reportedly poorly responsive initially.  Sister present at bedside and reports that patient is significantly improved from when she first saw her, but remains confused from her baseline which she reports is normal mental status.  Patient did feel warm to touch earlier today, but they did not have a thermometer to check her temperature.  Patient does report she feels short of breath.  Denies chest pain, nausea, vomiting, abdominal pain, diarrhea, dysuria.      Physical Exam   Triage Vital Signs: ED Triage Vitals  Encounter Vitals Group     BP 03/03/23 0912 106/62     Systolic BP Percentile --      Diastolic BP Percentile --      Pulse Rate 03/03/23 0912 (!) 124     Resp 03/03/23 0912 20     Temp 03/03/23 0912 98.9 F (37.2 C)     Temp Source 03/03/23 0912 Oral     SpO2 03/03/23 0912 (!) 75 %     Weight 03/03/23 0913 150 lb (68 kg)     Height 03/03/23 0913 5\' 2"  (1.575 m)     Head Circumference --      Peak Flow --      Pain Score 03/03/23 0913 6     Pain Loc --      Pain Education --      Exclude from Growth Chart --     Most recent vital signs: Vitals:   03/03/23 1500 03/03/23 1515  BP: 110/74 98/75  Pulse: (!) 104 (!) 104  Resp: 18 17  Temp:    SpO2: 95% 95%     General: Awake, interactive  CV:  Tachycardic with regular rhythm, normal peripheral  perfusion Resp:  Lung sounds coarse bilaterally with somewhat limited air movement, worse on the left Abd:  Soft, nondistended.,  No appreciable tenderness to palpation Neuro:  Symmetric facial movement, fluid speech, somewhat slow to respond, but answering basic questions appropriately and moving extremity spontaneously and equally   ED Results / Procedures / Treatments   Labs (all labs ordered are listed, but only abnormal results are displayed) Labs Reviewed  RESP PANEL BY RT-PCR (RSV, FLU A&B, COVID)  RVPGX2 - Abnormal; Notable for the following components:      Result Value   SARS Coronavirus 2 by RT PCR POSITIVE (*)    All other components within normal limits  BLOOD GAS, VENOUS - Abnormal; Notable for the following components:   pCO2, Ven 43 (*)    pO2, Ven 54 (*)    Acid-base deficit 2.6 (*)    All other components within normal limits  COMPREHENSIVE METABOLIC PANEL - Abnormal; Notable for the following components:   Creatinine, Ser 1.11 (*)    Calcium 8.5 (*)    Albumin 3.3 (*)    GFR, Estimated 53 (*)  All other components within normal limits  CBC WITH DIFFERENTIAL/PLATELET - Abnormal; Notable for the following components:   RDW 16.5 (*)    Lymphs Abs 0.4 (*)    All other components within normal limits  TROPONIN I (HIGH SENSITIVITY) - Abnormal; Notable for the following components:   Troponin I (High Sensitivity) 39 (*)    All other components within normal limits  CULTURE, BLOOD (ROUTINE X 2)  CULTURE, BLOOD (ROUTINE X 2)  LACTIC ACID, PLASMA  LACTIC ACID, PLASMA  URINALYSIS, W/ REFLEX TO CULTURE (INFECTION SUSPECTED)     EKG EKG independently reviewed interpreted by myself (ER attending) demonstrates:  EKG demonstrates sinus tachycardia at a rate of 144, PR 180, URS 83, QTc 436, significant artifact present, but no appreciable acute ST changes, computer reading as MI, but suspect that this is due to baseline wander  RADIOLOGY Imaging independently  reviewed and interpreted by myself demonstrates:  CXR with likely left-sided pneumonia CT head without acute bleed CT of the chest without evidence of PE, redemonstrates left-sided consolidation  PROCEDURES:  Critical Care performed: Yes, see critical care procedure note(s)  CRITICAL CARE Performed by: Trinna Post   Total critical care time: 42 minutes  Critical care time was exclusive of separately billable procedures and treating other patients.  Critical care was necessary to treat or prevent imminent or life-threatening deterioration.  Critical care was time spent personally by me on the following activities: development of treatment plan with patient and/or surrogate as well as nursing, discussions with consultants, evaluation of patient's response to treatment, examination of patient, obtaining history from patient or surrogate, ordering and performing treatments and interventions, ordering and review of laboratory studies, ordering and review of radiographic studies, pulse oximetry and re-evaluation of patient's condition.   Procedures   MEDICATIONS ORDERED IN ED: Medications  lactated ringers infusion ( Intravenous New Bag/Given 03/03/23 1329)  norepinephrine (LEVOPHED) 4mg  in (0.016 mg/mL) premix infusion (1 mcg/min Intravenous Rate/Dose Change 03/03/23 1405)  ipratropium-albuterol (DUONEB) 0.5-2.5 (3) MG/3ML nebulizer solution (has no administration in time range)  lactated ringers bolus 1,000 mL (0 mLs Intravenous Stopped 03/03/23 1130)  cefTRIAXone (ROCEPHIN) 1 g in sodium chloride 0.9 % 100 mL IVPB (0 g Intravenous Stopped 03/03/23 1023)  azithromycin (ZITHROMAX) 500 mg in sodium chloride 0.9 % 250 mL IVPB (0 mg Intravenous Stopped 03/03/23 1309)  ipratropium-albuterol (DUONEB) 0.5-2.5 (3) MG/3ML nebulizer solution 9 mL (9 mLs Nebulization Given 03/03/23 1144)  methylPREDNISolone sodium succinate (SOLU-MEDROL) 125 mg/2 mL injection 125 mg (125 mg Intravenous Given 03/03/23 1147)   iohexol (OMNIPAQUE) 350 MG/ML injection 75 mL (75 mLs Intravenous Contrast Given 03/03/23 1057)  lactated ringers bolus 1,000 mL (0 mLs Intravenous Stopped 03/03/23 1310)  acetaminophen (TYLENOL) tablet 650 mg (650 mg Oral Given 03/03/23 1405)  fentaNYL (SUBLIMAZE) injection 25 mcg (25 mcg Intravenous Given 03/03/23 1420)     IMPRESSION / MDM / ASSESSMENT AND PLAN / ED COURSE  I reviewed the triage vital signs and the nursing notes.  Differential diagnosis includes, but is not limited to, pneumonia, viral illness, pulmonary embolism, ACS, pneumothorax, COPD exacerbation  Patient's presentation is most consistent with acute presentation with potential threat to life or bodily function.  72 year old female presenting to the emergency department with hypoxia, altered mental status, tactile fevers at home.  Sepsis orders initiated with empiric Rocephin and azithromycin.  Will start with 1 L of IV fluids given reported history of CHF.  Clinical Course as of 03/03/23 1536  Thu Mar 03, 2023  1037 Notified by RN that patient had drop in blood pressure after 1 L LR with systolic down to the 60s.  Ordered for second liter which will be greater than 30 cc/kg of fluid for her ideal body weight, but will also go ahead and placed a Levophed order given degree of hypotension. [NR]  1203 SARS Coronavirus 2 by RT PCR(!): POSITIVE COVID test positive.  Patient reevaluated.  On 3 of Levophed with maps around 90, IV fluids running.  CT reads pending, but no obvious PE or bleed on my review.  Will wean off of Levophed as able and await radiology reads. [NR]  1300 Levophed was able to be fully be weaned off, but patient had recurrent hypotension with systolics in the 70s within 30 minutes.  In the setting of this, Levophed was restarted.  Will reach out to ICU team to discuss admission. [NR]  1534 Case reviewed with ICU team.  They will evaluate the patient for anticipated admission. [NR]    Clinical Course User  Index [NR] Trinna Post, MD    Sepsis - Repeat Assessment  Performed at:    1203  Vitals     See chart  Heart:     Tachycardic  Lungs:    Course, worse on left  Capillary Refill:   <2 sec  Peripheral Pulse:   Radial pulse palpable  Skin:     Normal Color      FINAL CLINICAL IMPRESSION(S) / ED DIAGNOSES   Final diagnoses:  Acute on chronic hypoxic respiratory failure (HCC)  COVID-19  Pneumonia of left lung due to infectious organism, unspecified part of lung     Rx / DC Orders   ED Discharge Orders     None        Note:  This document was prepared using Dragon voice recognition software and may include unintentional dictation errors.   Trinna Post, MD 03/03/23 1536    Trinna Post, MD 03/04/23 [phone]

## 2023-03-03 NOTE — Progress Notes (Signed)
Progress Note   Pt is alert and oriented in no respiratory distress on 7L HFNC.  She is no longer requiring levophed gtt and is maintaining map >65 and sbp >90.  Pt stable to admit to stepdown unit by hospitalist team.    Zada Girt, AGNP  Pulmonary/Critical Care Pager [phone] (please enter 7 digits) PCCM Consult Pager [phone] (please enter 7 digits)

## 2023-03-03 NOTE — ED Triage Notes (Signed)
Patient to ED via POV brought in by sister. Sister reports pt was confused and O2 in the 70's. Suppose to wear O2 but not wearing any upon arrival. Scheduled for angiogram of left leg today at 10- suppose to have surgery for broken ankle tomorrow. Patient answer all questions correctly in triage.  Patient placed on O2 in triage.

## 2023-03-03 NOTE — H&P (View-Only) (Signed)
 Hospital Consult    Reason for Consult:  Evaluate for Left Lower Extremity Angiogram due to confusion.  Requesting Physician:  Dr Nilsa Cross MD MRN #:  [id number]  History of Present Illness: This is a 72 y.o. female who presents to [facility] emergency department this morning confused with oxygen  saturations in the 70s.  Patient was scheduled to have a left lower extremity angiogram today but was taken via EMS to the emergency room due to this confusion.  Currently the patient has being worked up by the emergency room.  At this time we will cancel the patient's left lower extremity angiogram until further stabilized and confusion has resolved.  If the patient's symptoms resolve this afternoon we will gladly take to the vascular lab to complete her left lower extremity angiogram.  Past Medical History:  Diagnosis Date   (HFpEF) heart failure with preserved ejection fraction (HCC) 06/07/2004   a.) TTE 06/07/2004: EF 50-55%; inf HK. b.) TTE 04/17/2018: EF 45%; GLS -14.6%; inferoseptal and inferoposterior HK; sev LA enlargement; mild PR, mod TR, sev MR; G2DD. c.) TTE 12/05/2018: EF >55%, mild LVH; mild MR. d.) TTE 12/18/2020: EF >55%; mild LA enlargement; triv TR/PR, mod MR; G1DD; e.) TTE 07/13/2022: EF 55-60%, no RWMAs, mild MR;  f.) TTE 02/02/2023: EF 60-65%, no RMWas, triv MR, G1DD   Acute metabolic encephalopathy    Acute respiratory failure with hypoxia (HCC)    Anemia of chronic disease    Anxiety    Aortic atherosclerosis (HCC)    Asthma    CAD (coronary artery disease) 06/08/2004   a.) NSTEMI 06/05/2004. b.) LHC 06/08/2004: EF 70%; 50% mLAD, 50% dLAD, 99% OM3, 50% pRCA, 50% mRCA; PCI to OM3 placing a DES x 1 (unknown type). c.) Lexi scan 09/14/2021: normal LV function with no ischemia or scar.   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    a.) followed by pain management; on transdermal buprenorphine    COPD (chronic obstructive pulmonary disease) (HCC)    Depression     Diverticulosis    DOE (dyspnea on exertion)    Dysphagia    Empyema of right pleural space (HCC) 02/16/2018   Hepatic cirrhosis (HCC)    HLD (hyperlipidemia)    Hypertension    IDA (iron deficiency anemia)    Ischemic cardiomyopathy    a.) TTE 06/07/2004: 50-55%; b.) TTE 04/17/2018: EF 45%; c.) TTE 12/05/2018: >55%; d.) TTE 12/18/2020: >55%; e.) MV 09/14/2021: EF 64%; f.) TTE 07/13/2022: EF 55-60%; g.) TTE 02/02/2023: 60-65%   Long term current use of aspirin     Long term current use of clopidogrel     NSTEMI (non-ST elevated myocardial infarction) (HCC) 06/05/2004   a.)  PCI on 06/08/2004 placing a DES x1 (unknown type) to 99% lesion in OM3.   Osteopenia    Osteoporosis    Peripheral vascular disease (HCC)    a.) s/p BILATERAL femoral endarterectomies and open angioplasty with BILATERAL EIA stenting 09/16/2021   Pneumonia    PONV (postoperative nausea and vomiting)     Past Surgical History:  Procedure Laterality Date   COLONOSCOPY WITH PROPOFOL  N/A 04/12/2017   Procedure: COLONOSCOPY WITH PROPOFOL ;  Surgeon: Gaylyn Gladis PENNER, MD;  Location: [facility] ENDOSCOPY;  Service: Endoscopy;  Laterality: N/A;   CORONARY ANGIOPLASTY WITH STENT PLACEMENT Left 06/08/2004   Procedure: CORONARY ANGIOPLASTY WITH STENT PLACEMENT; Location: [facility]; Surgeon: Margie Lovelace, MD   ENDARTERECTOMY FEMORAL Bilateral 09/16/2021   Procedure: ENDARTERECTOMY FEMORAL;  Surgeon: Marea Selinda RAMAN, MD;  Location:  [facility] ORS;  Service: Vascular;  Laterality: Bilateral;   ENDOBRONCHIAL ULTRASOUND N/A 11/28/2017   Procedure: ENDOBRONCHIAL ULTRASOUND;  Surgeon: Verdia Art, MD;  Location: [facility] ORS;  Service: Pulmonary;  Laterality: N/A;   INSERTION OF ILIAC STENT Bilateral 09/16/2021   Procedure: INSERTION OF ILIAC STENT;  Surgeon: Marea Selinda RAMAN, MD;  Location: [facility] ORS;  Service: Vascular;  Laterality: Bilateral;   LOWER EXTREMITY ANGIOGRAPHY Right 08/12/2021   Procedure: Lower Extremity Angiography;  Surgeon: Marea Selinda RAMAN, MD;  Location: [facility] INVASIVE CV LAB;  Service: Cardiovascular;  Laterality: Right;   PARTIAL HYSTERECTOMY N/A    REDUCTION MAMMAPLASTY     VIDEO ASSISTED THORACOSCOPY (VATS)/THOROCOTOMY Right 03/2018   Procedure: VIDEO ASSISTED THORACOSCOPY (VATS)/THOROCOTOMY (partial decortication, loculated empyema); Location: [facility]    Allergies  Allergen Reactions   Penicillins Other (See Comments)    Tolerated cephalosporins with no significant documented ADRs -3rd generation: CEFTRIAXONE : 04/14/2018, 05/26/2021 and CEFDINIR: 04/14/2018  -4th generation CEFEPIME : 04/18/2018  Has tolerated amoxicillin-clavulanate in the past.   PCN rxn causing immediate rash, facial/tongue/throat swelling, SOB or lightheadedness with hypotension: Yes  No reaction causing severe rash involving mucus membranes or skin necrosis, reaction that required hospitalization, or reaction occurring within last 10 years.    Prior to Admission medications   Medication Sig Start Date End Date Taking? Authorizing Provider  acetaminophen  (TYLENOL ) 500 MG tablet Take 2 tablets (1,000 mg total) by mouth every 8 (eight) hours. 02/04/23   Fausto Burnard LABOR, DO  albuterol  (PROVENTIL  HFA;VENTOLIN  HFA) 108 (90 Base) MCG/ACT inhaler Inhale into the lungs every 6 (six) hours as needed for wheezing or shortness of breath.    [provider]  amitriptyline  (ELAVIL ) 100 MG tablet Take 100 mg by mouth at bedtime. 06/02/22 06/02/23  [provider]  aspirin  EC 81 MG tablet Take 1 tablet (81 mg total) by mouth daily. 08/12/21   Marea Selinda RAMAN, MD  atorvastatin  (LIPITOR) 40 MG tablet Take 40 mg by mouth daily. 05/18/21   [provider]  buprenorphine  (BUTRANS ) 5 MCG/HR PTWK Place 1 patch onto the skin once a week for 28 days. Patient taking differently: Place 1 patch onto the skin once a week. Replaces on Saturday 02/08/23 03/08/23  Marcelino Nurse, MD  buprenorphine  (BUTRANS ) 7.5 MCG/HR Place 1 patch onto the skin once a  week for 28 days. Patient not taking: Reported on 03/01/2023 02/08/23 03/08/23  Marcelino Nurse, MD  clopidogrel  (PLAVIX ) 75 MG tablet TAKE 1 TABLET BY MOUTH DAILY AT 6AM Patient taking differently: Take 75 mg by mouth daily. 04/30/22   Brown, Fallon E, NP  gabapentin  (NEURONTIN ) 300 MG capsule Take 2 capsules (600 mg total) by mouth at bedtime. Patient taking differently: Take 600 mg by mouth 2 (two) times daily. One capsule in the morning, 2 capsules at bedtime 10/29/21   Brown, Fallon E, NP  hydrOXYzine  (ATARAX ) 25 MG tablet TAKE ONE (1) TABLET BY MOUTH TWO TIMES PER DAY AS NEEDED FOR ITCHING 01/26/23   [provider]  ipratropium-albuterol  (DUONEB) 0.5-2.5 (3) MG/3ML SOLN Take 3 mLs by nebulization every 6 (six) hours as needed. 02/04/23 03/06/23  Fausto Burnard A, DO  Magnesium  400 MG CAPS Take 1 tablet by mouth daily.    [provider]  nitroGLYCERIN  (NITROSTAT ) 0.4 MG SL tablet Place under the tongue. 01/20/23 01/20/24  [provider]  oxyCODONE  (OXY IR/ROXICODONE ) 5 MG immediate release tablet Take 1-2 tablets (5-10 mg total) by mouth every 4 (four) hours as needed for severe pain or  moderate pain. 02/04/23   Fausto Sor A, DO  pregabalin  (LYRICA ) 150 MG capsule Take 150 mg by mouth 3 (three) times daily. 07/05/22   [provider]  senna-docusate (SENOKOT-S) 8.6-50 MG tablet Take 2 tablets by mouth at bedtime. Patient not taking: Reported on 03/01/2023 02/04/23   Fausto Sor A, DO  tiZANidine  (ZANAFLEX ) 2 MG tablet Take 1-2 tablets (2-4 mg total) by mouth at bedtime as needed for muscle spasms. 01/27/23 03/28/23  Marcelino Nurse, MD  torsemide  (DEMADEX ) 20 MG tablet Take 1 tablet (20 mg total) by mouth daily as needed (weight gain or swelling). 02/04/23   Fausto Sor A, DO  TRELEGY ELLIPTA  100-62.5-25 MCG/ACT AEPB Inhale 1 puff into the lungs daily. 05/30/21   Patel, Sona, MD  vitamin B-12 (CYANOCOBALAMIN ) 1000 MCG tablet Take 1,000 mcg by mouth daily.    [provider]  Vitamin E  268 MG (400 UNIT) CAPS Take 400 Units by mouth daily.    [provider]    Social History   Socioeconomic History   Marital status: Divorced    Spouse name: Not on file   Number of children: Not on file   Years of education: Not on file   Highest education level: Not on file  Occupational History   Not on file  Tobacco Use   Smoking status: Former    Current packs/day: 0.50    Average packs/day: 0.5 packs/day for 20.0 years (10.0 ttl pk-yrs)    Types: Cigarettes   Smokeless tobacco: Never  Vaping Use   Vaping status: Some Days   Substances: Nicotine , Flavoring  Substance and Sexual Activity   Alcohol use: Never    Alcohol/week: 3.0 standard drinks of alcohol    Types: 1 Glasses of wine, 2 Cans of beer per week    Comment: rarely   Drug use: Never   Sexual activity: Not Currently  Other Topics Concern   Not on file  Social History Narrative   Not on file   Social Determinants of Health   Financial Resource Strain: High Risk (02/16/2018)   Overall Financial Resource Strain (CARDIA)    Difficulty of Paying Living Expenses: Very hard  Food Insecurity: No Food Insecurity (01/31/2023)   Hunger Vital Sign    Worried About Running Out of Food in the Last Year: Never true    Ran Out of Food in the Last Year: Never true  Transportation Needs: No Transportation Needs (01/31/2023)   PRAPARE - Administrator, Civil Service (Medical): No    Lack of Transportation (Non-Medical): No  Physical Activity: Inactive (02/16/2018)   Exercise Vital Sign    Days of Exercise per Week: 0 days    Minutes of Exercise per Session: 0 min  Stress: Stress Concern Present (02/16/2018)   [employer] of Occupational Health - Occupational Stress Questionnaire    Feeling of Stress : Rather much  Social Connections: Unknown (02/16/2018)   Social Connection and Isolation Panel [NHANES]    Frequency of Communication with Friends and Family: More than three  times a week    Frequency of Social Gatherings with Friends and Family: More than three times a week    Attends Religious Services: Patient declined    Database administrator or Organizations: Patient declined    Attends Banker Meetings: Patient declined    Marital Status: Divorced  Intimate Partner Violence: Not At Risk (01/31/2023)   Humiliation, Afraid, Rape, and Kick questionnaire    Fear of  Current or Ex-Partner: No    Emotionally Abused: No    Physically Abused: No    Sexually Abused: No     Family History  Problem Relation Age of Onset   Breast cancer Mother 16   Hypertension Mother    Dementia Mother    Breast cancer Maternal Aunt 63   Breast cancer Other 35   Hypertension Father    Heart disease Father    Diabetes Father    Diabetes Sister     ROS: Otherwise negative unless mentioned in HPI  Physical Examination  Vitals:   03/03/23 1035 03/03/23 1047  BP:  (!) 86/65  Pulse:  (!) 104  Resp:  20  Temp:  98.4 F (36.9 C)  SpO2: 93% 95%   Body mass index is 27.44 kg/m.  General:  WDWN in NAD Gait: Not observed HENT: WNL, normocephalic Pulmonary: normal non-labored breathing, without Rales, rhonchi,  wheezing Cardiac: regular, tachycardia at 120, without  Murmurs, rubs or gallops; without carotid bruits Abdomen: Positive bowel sounds throughout soft, NT/ND, no masses Skin: without rashes Vascular Exam/Pulses: Weak palpable pulses throughout with exception to left lower extremity.  Unable to palpate left lower extremity pulses. Extremities: without ischemic changes, without Gangrene , without cellulitis; without open wounds;  Musculoskeletal: no muscle wasting or atrophy  Neurologic: A&O X 0; Patient is in respiratory distress and confused. Unable to follow commands or answer questions appropriately.  Psychiatric:  The pt is currently confused and in respiratory distress. Flat affect. Lymph:  Unremarkable  CBC    Component Value Date/Time    WBC 8.4 03/03/2023 0932   RBC 4.46 03/03/2023 0932   HGB 12.3 03/03/2023 0932   HGB 13.6 10/31/2013 1541   HCT 40.1 03/03/2023 0932   HCT 41.4 10/31/2013 1541   PLT 276 03/03/2023 0932   PLT 272 10/31/2013 1541   MCV 89.9 03/03/2023 0932   MCV 91 10/31/2013 1541   MCH 27.6 03/03/2023 0932   MCHC 30.7 03/03/2023 0932   RDW 16.5 (H) 03/03/2023 0932   RDW 13.5 10/31/2013 1541   LYMPHSABS 0.4 (L) 03/03/2023 0932   LYMPHSABS 2.0 10/31/2013 1541   MONOABS 0.4 03/03/2023 0932   MONOABS 0.3 10/31/2013 1541   EOSABS 0.0 03/03/2023 0932   EOSABS 0.2 10/31/2013 1541   BASOSABS 0.0 03/03/2023 0932   BASOSABS 0.1 10/31/2013 1541    BMET    Component Value Date/Time   NA 140 03/03/2023 0932   NA 138 10/31/2013 1541   K 4.2 03/03/2023 0932   K 4.5 10/31/2013 1541   CL 109 03/03/2023 0932   CL 101 10/31/2013 1541   CO2 22 03/03/2023 0932   CO2 29 10/31/2013 1541   GLUCOSE 85 03/03/2023 0932   GLUCOSE 96 10/31/2013 1541   BUN 21 03/03/2023 0932   BUN 10 10/31/2013 1541   CREATININE 1.11 (H) 03/03/2023 0932   CREATININE 0.91 10/31/2013 1541   CALCIUM  8.5 (L) 03/03/2023 0932   CALCIUM  9.1 10/31/2013 1541   GFRNONAA 53 (L) 03/03/2023 0932   GFRNONAA >60 10/31/2013 1541   GFRAA >60 04/13/2018 1030   GFRAA >60 10/31/2013 1541    COAGS: Lab Results  Component Value Date   INR 1.1 07/13/2022   INR 1.1 05/26/2021   INR 1.21 04/12/2018     Non-Invasive Vascular Imaging:   CTA of the chest with PE protocol ordered and completed awaiting results. CT of the head ordered and completed awaiting results.  Statin:  Yes.   Beta Blocker:  No. Aspirin :  Yes.   ACEI:  No. ARB:  No. CCB use:  No Other antiplatelets/anticoagulants:  Yes.   Plavix  75 mg Daily    ASSESSMENT/PLAN: This is a 72 y.o. female who presents to [facility] emergency department in respiratory distress and confused.  Patient was scheduled for a left lower extremity angiogram with vascular surgery this morning but due  to the patient's current condition is placed on hold.  Patient is being worked up for possible pulmonary embolism at this time.  If the patient's symptoms resolve we may consider the left lower extremity angiogram this afternoon.  The patient is positive for pulmonary embolisms we will discuss the possibility of a pulmonary thrombectomy later this afternoon.   -I discussed the plan in detail with Dr. Selinda Gu MD and he agrees with the plan.   Gwendlyn JONELLE Shank [facility] 03/03/2023 11:24 AM

## 2023-03-04 ENCOUNTER — Ambulatory Visit: Admission: RE | Admit: 2023-03-04 | Payer: HMO | Source: Home / Self Care | Admitting: Podiatry

## 2023-03-04 ENCOUNTER — Encounter
Admission: EM | Disposition: A | Discharge status: Home Health | Payer: Self-pay | Source: Home / Self Care | Attending: Internal Medicine

## 2023-03-04 ENCOUNTER — Encounter: Payer: Self-pay | Admitting: Internal Medicine

## 2023-03-04 DIAGNOSIS — R6520 Severe sepsis without septic shock: Secondary | ICD-10-CM | POA: Diagnosis not present

## 2023-03-04 DIAGNOSIS — A419 Sepsis, unspecified organism: Secondary | ICD-10-CM | POA: Diagnosis not present

## 2023-03-04 LAB — CBC
HCT: 32.5 % — ABNORMAL LOW (ref 36.0–46.0)
HCT: 29.8 % — ABNORMAL LOW (ref 36.0–46.0)
Hemoglobin: 10.4 g/dL — ABNORMAL LOW (ref 12.0–15.0)
Hemoglobin: 9.6 g/dL — ABNORMAL LOW (ref 12.0–15.0)
MCH: 27.8 pg (ref 26.0–34.0)
MCH: 28.7 pg (ref 26.0–34.0)
MCHC: 32 g/dL (ref 30.0–36.0)
MCHC: 32.2 g/dL (ref 30.0–36.0)
MCV: 86.9 fL (ref 80.0–100.0)
MCV: 89 fL (ref 80.0–100.0)
Platelets: 249 10*3/uL (ref 150–400)
Platelets: 215 10*3/uL (ref 150–400)
RBC: 3.74 MIL/uL — ABNORMAL LOW (ref 3.87–5.11)
RBC: 3.35 MIL/uL — ABNORMAL LOW (ref 3.87–5.11)
RDW: 16.3 % — ABNORMAL HIGH (ref 11.5–15.5)
RDW: 16.6 % — ABNORMAL HIGH (ref 11.5–15.5)
WBC: 15.5 10*3/uL — ABNORMAL HIGH (ref 4.0–10.5)
WBC: 13.8 10*3/uL — ABNORMAL HIGH (ref 4.0–10.5)
nRBC: 0 % (ref 0.0–0.2)
nRBC: 0 % (ref 0.0–0.2)

## 2023-03-04 LAB — COMPREHENSIVE METABOLIC PANEL
ALT: 13 U/L (ref 0–44)
AST: 18 U/L (ref 15–41)
Albumin: 2.8 g/dL — ABNORMAL LOW (ref 3.5–5.0)
Alkaline Phosphatase: 76 U/L (ref 38–126)
Anion gap: 6 (ref 5–15)
BUN: 16 mg/dL (ref 8–23)
CO2: 25 mmol/L (ref 22–32)
Calcium: 8.1 mg/dL — ABNORMAL LOW (ref 8.9–10.3)
Chloride: 107 mmol/L (ref 98–111)
Creatinine, Ser: 0.76 mg/dL (ref 0.44–1.00)
GFR, Estimated: >60 mL/min (ref >60)
Glucose, Bld: 111 mg/dL — ABNORMAL HIGH (ref 70–99)
Potassium: 4.5 mmol/L (ref 3.5–5.1)
Sodium: 138 mmol/L (ref 135–145)
Total Bilirubin: 0.3 mg/dL (ref 0.3–1.2)
Total Protein: 5.8 g/dL — ABNORMAL LOW (ref 6.5–8.1)

## 2023-03-04 LAB — CREATININE, SERUM
Creatinine, Ser: 0.71 mg/dL (ref 0.44–1.00)
GFR, Estimated: >60 mL/min (ref >60)

## 2023-03-04 LAB — PROTIME-INR
INR: 1.2 (ref 0.8–1.2)
Prothrombin Time: 15.1 s (ref 11.4–15.2)

## 2023-03-04 LAB — PROCALCITONIN: Procalcitonin: 31.97 ng/mL

## 2023-03-04 LAB — CORTISOL-AM, BLOOD: Cortisol - AM: 4.2 ug/dL — ABNORMAL LOW (ref 6.7–22.6)

## 2023-03-04 SURGERY — OPEN REDUCTION INTERNAL FIXATION (ORIF) ANKLE FRACTURE
Anesthesia: Choice | Site: Ankle | Laterality: Left

## 2023-03-04 MED ORDER — INFLUENZA VAC A&B SURF ANT ADJ 0.5 ML IM SUSY
0.5000 mL | PREFILLED_SYRINGE | INTRAMUSCULAR | Status: DC
  Filled 2023-03-04: qty 0.5

## 2023-03-04 MED ORDER — METHYLPREDNISOLONE SODIUM SUCC 40 MG IJ SOLR: 40.0000 mg | Freq: Two times a day (BID) | INTRAMUSCULAR | Status: DC

## 2023-03-04 MED ORDER — FUROSEMIDE 10 MG/ML IJ SOLN
40.0000 mg | Freq: Every day | INTRAMUSCULAR | Status: DC
  Administered 2023-03-04 – 2023-03-05 (×2): 40 mg via INTRAVENOUS
  Filled 2023-03-04 (×2): qty 4

## 2023-03-04 MED ORDER — HYDROCODONE-ACETAMINOPHEN 5-325 MG PO TABS
1.0000 | ORAL_TABLET | ORAL | Status: DC | PRN
  Administered 2023-03-04 – 2023-03-06 (×9): 2 via ORAL
  Administered 2023-03-06: 1 via ORAL
  Administered 2023-03-06 – 2023-03-07 (×4): 2 via ORAL
  Administered 2023-03-07: 1 via ORAL
  Administered 2023-03-08 – 2023-03-12 (×14): 2 via ORAL
  Filled 2023-03-04 (×29): qty 2

## 2023-03-04 MED ORDER — IPRATROPIUM-ALBUTEROL 20-100 MCG/ACT IN AERS
1.0000 | INHALATION_SPRAY | Freq: Four times a day (QID) | RESPIRATORY_TRACT | Status: DC
  Administered 2023-03-04 – 2023-03-08 (×17): 1 via RESPIRATORY_TRACT
  Filled 2023-03-04: qty 4

## 2023-03-04 MED ORDER — METHYLPREDNISOLONE SODIUM SUCC 125 MG IJ SOLR
80.0000 mg | INTRAMUSCULAR | Status: DC
  Administered 2023-03-04: 80 mg via INTRAVENOUS
  Filled 2023-03-04: qty 2

## 2023-03-04 NOTE — Progress Notes (Signed)
PROGRESS NOTE    Wendy Arias  NFA:[id number] DOB: 06-22-1951 DOA: 03/03/2023 PCP: Wilford Corner, PA-C    Brief Narrative:    72 y.o. female with medical history significant of ischemic cardiomyopathy with preserved ejection fraction, COPD, depression, hepatic cirrhosis, hyperlipidemia, essential hypertension, iron deficiency anemia, depression with anxiety, osteoporosis, left ankle fracture recently who was brought to the ER with altered mental status.  Patient is on home O2 at 2 L.  In the ER however she was found to be confused.  She was scheduled to have an angiogram with vascular surgery this morning.  The daughter found her to be pale and hypoxic.  Patient also has recent left ankle fracture trimalleolar nature and was supposed to have surgery tomorrow.  While in the ER she was found to be hypoxic.  Patient also meets sepsis criteria with heart rate leukocytosis and fever.  She she turned out to have COVID-19 positive test results, also CTA showing pneumonia and urinalysis consistent with UTI.  Patient being admitted with sepsis due to pneumonia and UTI and most likely secondary to COVID-19 infection.  Patient was hypotensive initially requiring Levophed.  She has been titrated off Levophed at the moment and maintaining systolic blood pressure over 100 and MAP of more than 65.  ICU was initially consulted but now declined admission and return back to hospitalist admit.   9/6: Patient weaned off vasopressors.  Mentating more clearly.  Able to answer my questions appropriately.  Still feeling tachypneic, short of breath.  On 7 L.   Assessment & Plan:   Principal Problem:   Severe sepsis (HCC) Active Problems:   COPD exacerbation (HCC)   Acute metabolic encephalopathy   Essential hypertension   HLD (hyperlipidemia)   CAD (coronary artery disease)   Depression with anxiety   Ischemic cardiomyopathy   Atherosclerosis of artery of extremity with ulceration (HCC)   Closed left ankle  fracture   COVID-19 virus infection  Severe sepsis Septic shock Community acquired pneumonia COVID-19 infection Urinary tract infection Multiple sources of infection.  My suspicion is patient developed a COVID-19 infection and concomitant bacterial pneumonia.  Unclear whether the urinalysis results are the primary driver for shock physiology.  However shock physiology has essentially resolved at this time.  Patient mentating much more clearly.  Lactic acid only 1.  Indicates good perfusion. Plan: Stop IVF Continue Rocephin and azithromycin Lasix 40 mg IV daily Daily steroids Hold remdesivir for now Hold IV fluids Monitor vitals and fever curve  Acute hypoxic respiratory failure Likely multifactorial related to underlying COPD and COVID-19 infection and community-acquired pneumonia Currently on 7 L high flow Plan: Continue oxygen, wean as tolerated Lasix Steroids CAP treatment as above  COPD with acute exacerbation Patient wheezing on examination with increase in oxygen demand Suspect mild decompensation of COPD in the setting of acute infection Plan: IV steroids Aggressive bronchodilators Wean oxygen as tolerated  Acute metabolic encephalopathy Mental status is much improved.  Patient able to answer my questions appropriately  Peripheral vascular disease Scheduled for left lower extremity angiogram.  Vascular surgery plans currently on hold Continue antiplatelet therapy  Left trimalleolar fracture Podiatry has delayed surgical plans for now  Coronary artery disease Continue antiplatelet therapy  Essential hypertension Hold blood pressure medications for now  Hyperlipidemia Statin  DVT prophylaxis: SQ lovenox Code Status: DNR Family Communication:Family member at bedside Disposition Plan: Status is: Inpatient Remains inpatient appropriate because: Multiple acute issues as above   Level of care: Progressive  Consultants:  None  Procedures:   None  Antimicrobials: Rocephin Azithromycin    Subjective: Seen and examined.  Fatigued.  Increased work of breathing.  Mentating clearly.  Objective: Vitals:   03/04/23 1012 03/04/23 1030 03/04/23 1130 03/04/23 1300  BP:  117/63 117/79 105/76  Pulse:  88 85 85  Resp:    19  Temp: 98 F (36.7 C)     TempSrc:      SpO2:  96% 98% 97%  Weight:      Height:        Intake/Output Summary (Last 24 hours) at 03/04/2023 1421 Last data filed at 03/04/2023 1415 Gross per 24 hour  Intake 512.65 ml  Output 1950 ml  Net -1437.35 ml   Filed Weights   03/03/23 0913  Weight: 68 kg    Examination:  General exam: In mild respiratory distress Respiratory system: Scattered crackles, scattered wheeze, tachypneic, 7 L Cardiovascular system: S1-S2, RRR, no murmurs, no pedal edema Gastrointestinal system: Soft, NT/ND, normal bowel sounds Central nervous system: Alert and oriented. No focal neurological deficits. Extremities: Symmetric 5 x 5 power. Skin: No rashes, lesions or ulcers Psychiatry: Judgement and insight appear normal. Mood & affect appropriate.     Data Reviewed: I have personally reviewed following labs and imaging studies  CBC: Recent Labs  Lab 03/03/23 0325 03/03/23 0932 03/04/23 0521  WBC 15.5* 8.4 13.8*  NEUTROABS  --  7.5  --   HGB 10.4* 12.3 9.6*  HCT 32.5* 40.1 29.8*  MCV 86.9 89.9 89.0  PLT 249 276 215   Basic Metabolic Panel: Recent Labs  Lab 03/03/23 0325 03/03/23 0932 03/04/23 0521  NA  --  140 138  K  --  4.2 4.5  CL  --  109 107  CO2  --  22 25  GLUCOSE  --  85 111*  BUN  --  21 16  CREATININE 0.71 1.11* 0.76  CALCIUM  --  8.5* 8.1*   GFR: Estimated Creatinine Clearance: 57.5 mL/min (by C-G formula based on SCr of 0.76 mg/dL). Liver Function Tests: Recent Labs  Lab 03/03/23 0932 03/04/23 0521  AST 19 18  ALT 15 13  ALKPHOS 107 76  BILITOT 0.6 0.3  PROT 6.7 5.8*  ALBUMIN 3.3* 2.8*   No results for input(s): "LIPASE", "AMYLASE"  in the last 168 hours. No results for input(s): "AMMONIA" in the last 168 hours. Coagulation Profile: Recent Labs  Lab 03/04/23 0521  INR 1.2   Cardiac Enzymes: No results for input(s): "CKTOTAL", "CKMB", "CKMBINDEX", "TROPONINI" in the last 168 hours. BNP (last 3 results) No results for input(s): "PROBNP" in the last 8760 hours. HbA1C: No results for input(s): "HGBA1C" in the last 72 hours. CBG: No results for input(s): "GLUCAP" in the last 168 hours. Lipid Profile: No results for input(s): "CHOL", "HDL", "LDLCALC", "TRIG", "CHOLHDL", "LDLDIRECT" in the last 72 hours. Thyroid Function Tests: No results for input(s): "TSH", "T4TOTAL", "FREET4", "T3FREE", "THYROIDAB" in the last 72 hours. Anemia Panel: No results for input(s): "VITAMINB12", "FOLATE", "FERRITIN", "TIBC", "IRON", "RETICCTPCT" in the last 72 hours. Sepsis Labs: Recent Labs  Lab 03/03/23 0932 03/03/23 1409 03/04/23 0521  PROCALCITON  --   --  31.97  LATICACIDVEN 1.8 1.1  --     Recent Results (from the past 240 hour(s))  Resp panel by RT-PCR (RSV, Flu A&B, Covid) Anterior Nasal Swab     Status: Abnormal   Collection Time: 03/03/23  9:32 AM   Specimen: Anterior Nasal Swab  Result Value Ref Range  Status   SARS Coronavirus 2 by RT PCR POSITIVE (A) NEGATIVE Final    Comment: (NOTE) SARS-CoV-2 target nucleic acids are DETECTED.  The SARS-CoV-2 RNA is generally detectable in upper respiratory specimens during the acute phase of infection. Positive results are indicative of the presence of the identified virus, but do not rule out bacterial infection or co-infection with other pathogens not detected by the test. Clinical correlation with patient history and other diagnostic information is necessary to determine patient infection status. The expected result is Negative.  Fact Sheet for Patients: BloggerCourse.com  Fact Sheet for Healthcare  Providers: SeriousBroker.it  This test is not yet approved or cleared by the [place] FDA and  has been authorized for detection and/or diagnosis of SARS-CoV-2 by FDA under an Emergency Use Authorization (EUA).  This EUA will remain in effect (meaning this test can be used) for the duration of  the COVID-19 declaration under Section 564(b)(1) of the A ct, 21 U.S.C. section 360bbb-3(b)(1), unless the authorization is terminated or revoked sooner.     Influenza A by PCR NEGATIVE NEGATIVE Final   Influenza B by PCR NEGATIVE NEGATIVE Final    Comment: (NOTE) The Xpert Xpress SARS-CoV-2/FLU/RSV plus assay is intended as an aid in the diagnosis of influenza from Nasopharyngeal swab specimens and should not be used as a sole basis for treatment. Nasal washings and aspirates are unacceptable for Xpert Xpress SARS-CoV-2/FLU/RSV testing.  Fact Sheet for Patients: BloggerCourse.com  Fact Sheet for Healthcare Providers: SeriousBroker.it  This test is not yet approved or cleared by the [place] FDA and has been authorized for detection and/or diagnosis of SARS-CoV-2 by FDA under an Emergency Use Authorization (EUA). This EUA will remain in effect (meaning this test can be used) for the duration of the COVID-19 declaration under Section 564(b)(1) of the Act, 21 U.S.C. section 360bbb-3(b)(1), unless the authorization is terminated or revoked.     Resp Syncytial Virus by PCR NEGATIVE NEGATIVE Final    Comment: (NOTE) Fact Sheet for Patients: BloggerCourse.com  Fact Sheet for Healthcare Providers: SeriousBroker.it  This test is not yet approved or cleared by the [place] FDA and has been authorized for detection and/or diagnosis of SARS-CoV-2 by FDA under an Emergency Use Authorization (EUA). This EUA will remain in effect (meaning this test can be  used) for the duration of the COVID-19 declaration under Section 564(b)(1) of the Act, 21 U.S.C. section 360bbb-3(b)(1), unless the authorization is terminated or revoked.  Performed at [facility], [address] [place], [place] [address]   Blood Culture (routine x 2)     Status: None (Preliminary result)   Collection Time: 03/03/23  9:32 AM   Specimen: BLOOD  Result Value Ref Range Status   Specimen Description BLOOD RIGHT ANTECUBITAL  Final   Special Requests   Final    BOTTLES DRAWN AEROBIC AND ANAEROBIC Blood Culture adequate volume   Culture   Final    NO GROWTH < 24 HOURS Performed at [facility], [address], [place], [place] [address]    Report Status PENDING  Incomplete  Blood Culture (routine x 2)     Status: None (Preliminary result)   Collection Time: 03/03/23  9:32 AM   Specimen: BLOOD  Result Value Ref Range Status   Specimen Description BLOOD RIGHT ANTECUBITAL  Final   Special Requests   Final    BOTTLES DRAWN AEROBIC AND ANAEROBIC Blood Culture results may not be optimal due to an excessive volume of  blood received in culture bottles   Culture   Final    NO GROWTH < 24 HOURS Performed at [facility], [address] [place], [place] [address]    Report Status PENDING  Incomplete         Radiology Studies: CT Angio Chest PE W and/or Wo Contrast  Result Date: 03/03/2023 CLINICAL DATA:  Confused.  Hypoxic. EXAM: CT ANGIOGRAPHY CHEST WITH CONTRAST TECHNIQUE: Multidetector CT imaging of the chest was performed using the standard protocol during bolus administration of intravenous contrast. Multiplanar CT image reconstructions and MIPs were obtained to evaluate the vascular anatomy. RADIATION DOSE REDUCTION: This exam was performed according to the departmental dose-optimization program which includes automated exposure control, adjustment of the mA and/or kV according to patient size and/or use of iterative reconstruction  technique. CONTRAST:  75mL OMNIPAQUE IOHEXOL 350 MG/ML SOLN COMPARISON:  Chest CT without contrast 05/26/2021.  X-ray 03/03/2023 FINDINGS: Cardiovascular: Heart is nonenlarged. No pericardial effusion. The thoracic aorta has a normal course and caliber with some vascular calcifications. 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 Patent lumen narrows to focus of only 1.5 mm. Please correlate for any symptoms including for subclavian steal. Coronary artery calcifications are seen. No segmental or larger pulmonary embolism. Mediastinum/Nodes: Normal caliber thoracic esophagus. Slightly heterogeneous thyroid. No specific abnormal lymph node enlargement identified in the axillary regions. Some prominent bilateral hilar nodes are identified. There are some enlarged mediastinal nodes. This includes precarinal node right of midline on series 6, image 151 measuring 2.3 by 2.0 cm. Other nodes identified elsewhere in the mediastinum including AP window, subcarinal. Lungs/Pleura: Consolidative opacity along the left lower lobe with air bronchograms. There is some mild areas in the right lower lobe, middle lobe. Underlying centrilobular emphysematous changes with interstitial areas of thickening. No pneumothorax. No significant pleural effusion on the right. Trace on the left. Upper Abdomen: Dilated gallbladder in the upper abdomen. Please correlate with symptoms. There is a ectasia of the visualized abdominal aorta measuring up to 2.9 cm. Musculoskeletal: There is moderate compression deformity once again of the L1 level with some kyphosis and canal encroachment but unchanged from the prior examination. Review of the MIP images confirms the above findings. IMPRESSION: No pulmonary embolism identified.  Some breathing motion. Consolidative opacity left lower lobe consistent with a pneumonia with trace pleural fluid. Some mild patchy  opacities elsewhere. Recommend follow-up to confirm clearance. Several enlarged mediastinal greater than hilar lymph nodes. Please correlate for any known history. These could be reactive and recommend follow up after treatment to see if these persist. Scattered vascular calcifications. 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 Please correlate for any symptomatology including for subclavian steal. Dilated gallbladder. If there is concern of acute gallbladder pathology ultrasound can be performed when clinically appropriate. 2.9 cm abdominal aortic dilatation. Recommend follow-up ultrasound every 5 years. This recommendation follows ACR consensus guidelines: White Paper of the ACR Incidental Findings Committee II on Vascular Findings. J Am Coll Radiol 2013; 10:789-794. Aortic Atherosclerosis (ICD10-I70.0) and Emphysema (ICD10-J43.9). Electronically Signed   By: Karen Kays M.D.   On: 03/03/2023 12:27   CT Head Wo Contrast  Result Date: 03/03/2023 CLINICAL DATA:  Mental status change, unknown cause. EXAM: CT HEAD WITHOUT CONTRAST TECHNIQUE: Contiguous axial images were obtained from the base of the skull through the vertex without intravenous contrast. RADIATION  DOSE REDUCTION: This exam was performed according to the departmental dose-optimization program which includes automated exposure control, adjustment of the mA and/or kV according to patient size and/or use of iterative reconstruction technique. COMPARISON:  Head CT 07/13/2022 and MRI 11/03/2017 FINDINGS: Brain: There is no evidence of an acute infarct, intracranial hemorrhage, mass, midline shift, or extra-axial fluid collection. The ventricles and sulci are within normal limits for age. Vascular: Calcified atherosclerosis at the skull base. No hyperdense vessel. Skull: No acute fracture or suspicious osseous lesion. Sinuses/Orbits: Visualized paranasal sinuses and mastoid air cells are  clear. Unremarkable orbits. Other: None. IMPRESSION: Unremarkable CT appearance of the brain for age. Electronically Signed   By: Sebastian Ache M.D.   On: 03/03/2023 12:21   DG Chest Port 1 View  Result Date: 03/03/2023 CLINICAL DATA:  Shortness of breath EXAM: PORTABLE CHEST 1 VIEW COMPARISON:  X-ray 01/31/2023 FINDINGS: No pneumothorax or effusion. Underinflation. Normal cardiopericardial silhouette when adjusting for level of inflation. There is some prominence of the central vasculature. There is some ill-defined opacity along the left lung base. An infiltrate is possible. Recommend follow-up. IMPRESSION: Underinflation with developing parenchymal opacity in the left lung base. Possible infiltrate. Recommend follow-up. Electronically Signed   By: Karen Kays M.D.   On: 03/03/2023 11:00        Scheduled Meds:  amitriptyline  100 mg Oral QHS   aspirin EC  81 mg Oral Daily   atorvastatin  40 mg Oral Daily   [START ON 03/05/2023] buprenorphine  1 patch Transdermal Weekly   clopidogrel  75 mg Oral Daily   cyanocobalamin  1,000 mcg Oral Daily   enoxaparin (LOVENOX) injection  40 mg Subcutaneous Q24H   fluticasone furoate-vilanterol  1 puff Inhalation Daily   And   umeclidinium bromide  1 puff Inhalation Daily   furosemide  40 mg Intravenous Daily   gabapentin  600 mg Oral BID   [START ON 03/05/2023] influenza vaccine adjuvanted  0.5 mL Intramuscular Tomorrow-1000   Ipratropium-Albuterol  1 puff Inhalation Q6H   magnesium oxide  400 mg Oral Daily   melatonin  5 mg Oral QHS   methylPREDNISolone (SOLU-MEDROL) injection  80 mg Intravenous Q24H   pregabalin  150 mg Oral TID   Vitamin E  400 Units Oral Daily   Continuous Infusions:  azithromycin Stopped (03/04/23 1415)   cefTRIAXone (ROCEPHIN)  IV Stopped (03/04/23 1231)     LOS: 1 day    Time spent: 45 min    Tresa Moore, MD [facility]   If 7PM-7AM, please contact night-coverage  03/04/2023, 2:21 PM

## 2023-03-04 NOTE — Progress Notes (Signed)
PHARMACIST - PHYSICIAN COMMUNICATION   CONCERNING: Methylprednisolone IV    Current order: Methylprednisolone IV 40mg  every 12 hours     DESCRIPTION: Per [facility] Protocol:   IV methylprednisolone will be converted to either a q12h or q24h frequency with the same total daily dose (TDD).  Ordered Dose: 1 to 125 mg TDD; convert to: TDD q24h.  Ordered Dose: 126 to 250 mg TDD; convert to: TDD div q12h.  Ordered Dose: >250 mg TDD; DAW.  Order has been adjusted to: Methylprednisolone IV 80mg  every 24 hours   Gardner Candle , PharmD, BCPS Clinical Pharmacist  03/04/2023 9:28 AM

## 2023-03-04 NOTE — TOC Initial Note (Signed)
Transition of Care [facility]) - Initial/Assessment Note    Patient Details  Name: Wendy Arias MRN: [id number] Date of Birth: Jul 07, 1950  Transition of Care [facility]) CM/SW Contact:    Marquita Palms, LCSW Phone Number: 03/04/2023, 9:23 AM  Clinical Narrative:                  CSW spoke with sister [name]. She reports that patients lives with her two adult sons in her home.   [name] reports that patient is in a wheelchair at this time due to her ankle being broken from an incident 3 weeks ago. She reports she was scheduled today for surgery.  She confirms that patient has O2 at home but rarely needs it. Per chart review O2 is through [employer]. Patient also has a 3-N-1 and a wheel chair at home  She reports patient uses [facility]. Patients family is interested in [facility]. TOC will follow up if higher level of care is needed after medical workup.    Expected Discharge Plan: Home w Home Health Services Barriers to Discharge: No Barriers Identified   Patient Goals and CMS Choice     Choice offered to / list presented to : [facility] POA / Guardian      Expected Discharge Plan and Services       Living arrangements for the past 2 months: Single Family Home                   DME Agency: [employer]                  Prior Living Arrangements/Services Living arrangements for the past 2 months: Single Family Home Lives with:: Adult Children   Do you feel safe going back to the place where you live?: Yes          Current home services: DME    Activities of Daily Living      Permission Sought/Granted                  Emotional Assessment              Admission diagnosis:  Severe sepsis (HCC) [A41.9, R65.20] Patient Active Problem List   Diagnosis Date Noted   Respiratory distress 03/03/2023   Severe sepsis (HCC) 03/03/2023   COVID-19 virus infection 03/03/2023   Closed left ankle fracture 01/31/2023   Peripheral vascular disease (HCC) (hx of  femoral endarterectomy September 17 2021) 01/27/2023   Neuropathic pain of lower extremity 01/27/2023   Acute respiratory failure with hypoxia (HCC) 09/22/2022   Altered mental status 09/22/2022   Acute on chronic diastolic CHF (congestive heart failure) (HCC) 07/13/2022   Acute metabolic encephalopathy 07/13/2022   HLD (hyperlipidemia) 07/13/2022   CAD (coronary artery disease) 07/13/2022   Depression with anxiety 07/13/2022   Tobacco abuse 07/13/2022   Atherosclerosis of artery of extremity with rest pain (HCC) 09/16/2021   Atherosclerosis of artery of extremity with ulceration (HCC) 08/07/2021   COPD exacerbation (HCC) 05/26/2021   Elevated serum alkaline phosphatase level 08/10/2018   Chronic diastolic CHF (congestive heart failure), NYHA class 3 (HCC) 08/09/2018   History of empyema of pleura 08/09/2018   Chronic insomnia 08/09/2018   Itching 08/09/2018   Chronic cough 08/09/2018   Coronary artery disease involving native coronary artery of native heart without angina pectoris 05/08/2018   At risk for fluid volume overload 04/18/2018   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 04/18/2018  Ischemic cardiomyopathy 04/18/2018   Mitral regurgitation 04/18/2018   Aspiration into respiratory tract 04/17/2018   Anxiety and depression 04/17/2018   Dysphagia 04/17/2018   Dyspnea on exertion 04/17/2018   IDA (iron deficiency anemia) 04/17/2018   Infection by Streptococcus, viridans group 04/17/2018   Abnormal pleural fluid    Hepatic cirrhosis (HCC)    Anemia of chronic disease    Empyema of right pleural space (HCC)    Pneumonia 04/09/2018   Postmenopausal 03/29/2018   Gastroesophageal reflux disease without esophagitis 03/01/2018   Pure hypercholesterolemia 03/01/2018   Abscess of middle lobe of right lung with pneumonia (HCC) 02/16/2018   Essential hypertension 12/16/2017   Other nonspecific abnormal finding of lung field 12/16/2017   Mass of middle lobe of  right lung 11/22/2017   Mass of lower lobe of right lung 11/22/2017   Mediastinal adenopathy 11/22/2017   Acute encephalopathy 11/03/2017   Benzodiazepine withdrawal (HCC) 11/03/2017   H/O diarrhea 06/14/2017   PCP:  Wilford Corner, PA-C Pharmacy:   [facility] - [place], [place] - [address] [place] [address] Phone: [phone] Fax: [phone]  [facility] [place], [place] - [address] [place] [address] Phone: [phone] Fax: [phone]     Social Determinants of Health (SDOH) Social History: SDOH Screenings   Food Insecurity: No Food Insecurity (01/31/2023)  Housing: Low Risk  (01/31/2023)  Transportation Needs: No Transportation Needs (01/31/2023)  Utilities: Not At Risk (01/31/2023)  Depression (PHQ2-9): Low Risk  (01/27/2023)  Financial Resource Strain: High Risk (02/16/2018)  Physical Activity: Inactive (02/16/2018)  Social Connections: Unknown (02/16/2018)  Stress: Stress Concern Present (02/16/2018)  Tobacco Use: Medium Risk (03/03/2023)   SDOH Interventions:     Readmission Risk Interventions    07/14/2022   11:37 AM  Readmission Risk Prevention Plan  Transportation Screening Complete  PCP or Specialist Appt within 5-7 Days Complete  Home Care Screening Complete  Medication Review (RN CM) Complete

## 2023-03-05 DIAGNOSIS — R6520 Severe sepsis without septic shock: Secondary | ICD-10-CM | POA: Diagnosis not present

## 2023-03-05 DIAGNOSIS — A419 Sepsis, unspecified organism: Secondary | ICD-10-CM | POA: Diagnosis not present

## 2023-03-05 LAB — CBC WITH DIFFERENTIAL/PLATELET
Abs Immature Granulocytes: 0.05 10*3/uL (ref 0.00–0.07)
Basophils Absolute: 0 10*3/uL (ref 0.0–0.1)
Basophils Relative: 0 %
Eosinophils Absolute: 0 10*3/uL (ref 0.0–0.5)
Eosinophils Relative: 0 %
HCT: 35.8 % — ABNORMAL LOW (ref 36.0–46.0)
Hemoglobin: 11.4 g/dL — ABNORMAL LOW (ref 12.0–15.0)
Immature Granulocytes: 0 %
Lymphocytes Relative: 8 %
Lymphs Abs: 1.1 10*3/uL (ref 0.7–4.0)
MCH: 28.1 pg (ref 26.0–34.0)
MCHC: 31.8 g/dL (ref 30.0–36.0)
MCV: 88.2 fL (ref 80.0–100.0)
Monocytes Absolute: 0.6 10*3/uL (ref 0.1–1.0)
Monocytes Relative: 5 %
Neutro Abs: 11.1 10*3/uL — ABNORMAL HIGH (ref 1.7–7.7)
Neutrophils Relative %: 87 %
Platelets: 257 10*3/uL (ref 150–400)
RBC: 4.06 MIL/uL (ref 3.87–5.11)
RDW: 16.2 % — ABNORMAL HIGH (ref 11.5–15.5)
WBC: 12.8 10*3/uL — ABNORMAL HIGH (ref 4.0–10.5)
nRBC: 0 % (ref 0.0–0.2)

## 2023-03-05 LAB — BASIC METABOLIC PANEL
Anion gap: 11 (ref 5–15)
BUN: 25 mg/dL — ABNORMAL HIGH (ref 8–23)
CO2: 26 mmol/L (ref 22–32)
Calcium: 8.7 mg/dL — ABNORMAL LOW (ref 8.9–10.3)
Chloride: 100 mmol/L (ref 98–111)
Creatinine, Ser: 0.84 mg/dL (ref 0.44–1.00)
GFR, Estimated: >60 mL/min (ref >60)
Glucose, Bld: 136 mg/dL — ABNORMAL HIGH (ref 70–99)
Potassium: 4.2 mmol/L (ref 3.5–5.1)
Sodium: 137 mmol/L (ref 135–145)

## 2023-03-05 MED ORDER — METHYLPREDNISOLONE SODIUM SUCC 125 MG IJ SOLR
60.0000 mg | INTRAMUSCULAR | Status: DC
  Administered 2023-03-05 – 2023-03-06 (×2): 60 mg via INTRAVENOUS
  Filled 2023-03-05 (×2): qty 2

## 2023-03-05 MED ORDER — AZITHROMYCIN 250 MG PO TABS
500.0000 mg | ORAL_TABLET | Freq: Every day | ORAL | Status: AC
  Administered 2023-03-06 – 2023-03-07 (×2): 500 mg via ORAL
  Filled 2023-03-05 (×2): qty 2

## 2023-03-05 NOTE — Progress Notes (Signed)
PROGRESS NOTE    Wendy Arias  OZH:[id number] DOB: May 21, 1951 DOA: 03/03/2023 PCP: Wilford Corner, PA-C    Brief Narrative:    72 y.o. female with medical history significant of ischemic cardiomyopathy with preserved ejection fraction, COPD, depression, hepatic cirrhosis, hyperlipidemia, essential hypertension, iron deficiency anemia, depression with anxiety, osteoporosis, left ankle fracture recently who was brought to the ER with altered mental status.  Patient is on home O2 at 2 L.  In the ER however she was found to be confused.  She was scheduled to have an angiogram with vascular surgery this morning.  The daughter found her to be pale and hypoxic.  Patient also has recent left ankle fracture trimalleolar nature and was supposed to have surgery tomorrow.  While in the ER she was found to be hypoxic.  Patient also meets sepsis criteria with heart rate leukocytosis and fever.  She she turned out to have COVID-19 positive test results, also CTA showing pneumonia and urinalysis consistent with UTI.  Patient being admitted with sepsis due to pneumonia and UTI and most likely secondary to COVID-19 infection.  Patient was hypotensive initially requiring Levophed.  She has been titrated off Levophed at the moment and maintaining systolic blood pressure over 100 and MAP of more than 65.  ICU was initially consulted but now declined admission and return back to hospitalist admit.   9/6: Patient weaned off vasopressors.  Mentating more clearly.  Able to answer my questions appropriately.  Still feeling tachypneic, short of breath.  On 7 L.  9/7: Blood pressure stable.  Weaned to 1 L.  Much less tachypneic and short of breath.   Assessment & Plan:   Principal Problem:   Severe sepsis (HCC) Active Problems:   COPD exacerbation (HCC)   Acute metabolic encephalopathy   Essential hypertension   HLD (hyperlipidemia)   CAD (coronary artery disease)   Depression with anxiety   Ischemic  cardiomyopathy   Atherosclerosis of artery of extremity with ulceration (HCC)   Closed left ankle fracture   COVID-19 virus infection  Severe sepsis Septic shock Community acquired pneumonia COVID-19 infection Urinary tract infection Multiple sources of infection.  My suspicion is patient developed a COVID-19 infection and concomitant bacterial pneumonia.  Unclear whether the urinalysis results are the primary driver for shock physiology.  However shock physiology has essentially resolved at this time.  Patient mentating much more clearly.  Lactic acid only 1.  Indicates good perfusion. Plan: No IVF Continue Rocephin and azithromycin Continue Lasix 40 mg IV daily Daily steroids No indication for remdesivir Monitor vitals and fever curve  Acute hypoxic respiratory failure Likely multifactorial related to underlying COPD and COVID-19 infection and community-acquired pneumonia Was on 7 L high flow on admission.  Weaned to 1 L Plan: Continue oxygen, wean as tolerated Lasix, steroids, CAP treatment as above  COPD with acute exacerbation Patient wheezing on examination with increase in oxygen demand Suspect mild decompensation of COPD in the setting of acute infection Plan: IV steroids Aggressive bronchodilators Wean oxygen as tolerated Lung sounds improving  Acute metabolic encephalopathy Mental status is much improved.   Patient able to answer my questions appropriately  Peripheral vascular disease Scheduled for left lower extremity angiogram.   Vascular surgery plans currently on hold Continue antiplatelet therapy  Left trimalleolar fracture Podiatry has delayed surgical plans for now  Coronary artery disease Continue antiplatelet therapy  Essential hypertension Hold blood pressure medications for now Monitor BP and restart as appropriate  Hyperlipidemia Statin  DVT prophylaxis: SQ lovenox Code Status: DNR Family Communication:Family member at bedside  9/6 Disposition Plan: Status is: Inpatient Remains inpatient appropriate because: Multiple acute issues as above   Level of care: Progressive  Consultants:  None  Procedures:  None  Antimicrobials: Rocephin Azithromycin    Subjective: Seen and examined.  Energy level improving.  No pain complaints  Objective: Vitals:   03/04/23 2352 03/05/23 0342 03/05/23 0911 03/05/23 1145  BP: 120/86 134/75 126/79 (!) 136/92  Pulse: 86 80 77 79  Resp: 19 16  20   Temp: 97.9 F (36.6 C) 97.9 F (36.6 C) 98.1 F (36.7 C) 97.6 F (36.4 C)  TempSrc:  Oral  Oral  SpO2: 90% 94% 94% 92%  Weight:      Height:        Intake/Output Summary (Last 24 hours) at 03/05/2023 1354 Last data filed at 03/04/2023 1415 Gross per 24 hour  Intake 278.96 ml  Output --  Net 278.96 ml   Filed Weights   03/03/23 0913  Weight: 68 kg    Examination:  General exam: No distress Respiratory system: Scattered crackles bilaterally.  No wheeze.  Normal work of breathing.  1 L Cardiovascular system: S1-S2, RRR, no murmurs, no pedal edema Gastrointestinal system: Soft, NT/ND, normal bowel sounds Central nervous system: Alert and oriented. No focal neurological deficits. Extremities: Symmetric 5 x 5 power. Skin: No rashes, lesions or ulcers Psychiatry: Judgement and insight appear normal. Mood & affect appropriate.     Data Reviewed: I have personally reviewed following labs and imaging studies  CBC: Recent Labs  Lab 03/03/23 0325 03/03/23 0932 03/04/23 0521 03/05/23 1005  WBC 15.5* 8.4 13.8* 12.8*  NEUTROABS  --  7.5  --  11.1*  HGB 10.4* 12.3 9.6* 11.4*  HCT 32.5* 40.1 29.8* 35.8*  MCV 86.9 89.9 89.0 88.2  PLT 249 276 215 257   Basic Metabolic Panel: Recent Labs  Lab 03/03/23 0325 03/03/23 0932 03/04/23 0521 03/05/23 1005  NA  --  140 138 137  K  --  4.2 4.5 4.2  CL  --  109 107 100  CO2  --  22 25 26   GLUCOSE  --  85 111* 136*  BUN  --  21 16 25*  CREATININE 0.71 1.11* 0.76 0.84   CALCIUM  --  8.5* 8.1* 8.7*   GFR: Estimated Creatinine Clearance: 54.8 mL/min (by C-G formula based on SCr of 0.84 mg/dL). Liver Function Tests: Recent Labs  Lab 03/03/23 0932 03/04/23 0521  AST 19 18  ALT 15 13  ALKPHOS 107 76  BILITOT 0.6 0.3  PROT 6.7 5.8*  ALBUMIN 3.3* 2.8*   No results for input(s): "LIPASE", "AMYLASE" in the last 168 hours. No results for input(s): "AMMONIA" in the last 168 hours. Coagulation Profile: Recent Labs  Lab 03/04/23 0521  INR 1.2   Cardiac Enzymes: No results for input(s): "CKTOTAL", "CKMB", "CKMBINDEX", "TROPONINI" in the last 168 hours. BNP (last 3 results) No results for input(s): "PROBNP" in the last 8760 hours. HbA1C: No results for input(s): "HGBA1C" in the last 72 hours. CBG: No results for input(s): "GLUCAP" in the last 168 hours. Lipid Profile: No results for input(s): "CHOL", "HDL", "LDLCALC", "TRIG", "CHOLHDL", "LDLDIRECT" in the last 72 hours. Thyroid Function Tests: No results for input(s): "TSH", "T4TOTAL", "FREET4", "T3FREE", "THYROIDAB" in the last 72 hours. Anemia Panel: No results for input(s): "VITAMINB12", "FOLATE", "FERRITIN", "TIBC", "IRON", "RETICCTPCT" in the last 72 hours. Sepsis Labs: Recent Labs  Lab 03/03/23 0932 03/03/23  1409 03/04/23 0521  PROCALCITON  --   --  31.97  LATICACIDVEN 1.8 1.1  --     Recent Results (from the past 240 hour(s))  Resp panel by RT-PCR (RSV, Flu A&B, Covid) Anterior Nasal Swab     Status: Abnormal   Collection Time: 03/03/23  9:32 AM   Specimen: Anterior Nasal Swab  Result Value Ref Range Status   SARS Coronavirus 2 by RT PCR POSITIVE (A) NEGATIVE Final    Comment: (NOTE) SARS-CoV-2 target nucleic acids are DETECTED.  The SARS-CoV-2 RNA is generally detectable in upper respiratory specimens during the acute phase of infection. Positive results are indicative of the presence of the identified virus, but do not rule out bacterial infection or co-infection with other  pathogens not detected by the test. Clinical correlation with patient history and other diagnostic information is necessary to determine patient infection status. The expected result is Negative.  Fact Sheet for Patients: BloggerCourse.com  Fact Sheet for Healthcare Providers: SeriousBroker.it  This test is not yet approved or cleared by the [place] FDA and  has been authorized for detection and/or diagnosis of SARS-CoV-2 by FDA under an Emergency Use Authorization (EUA).  This EUA will remain in effect (meaning this test can be used) for the duration of  the COVID-19 declaration under Section 564(b)(1) of the A ct, 21 U.S.C. section 360bbb-3(b)(1), unless the authorization is terminated or revoked sooner.     Influenza A by PCR NEGATIVE NEGATIVE Final   Influenza B by PCR NEGATIVE NEGATIVE Final    Comment: (NOTE) The Xpert Xpress SARS-CoV-2/FLU/RSV plus assay is intended as an aid in the diagnosis of influenza from Nasopharyngeal swab specimens and should not be used as a sole basis for treatment. Nasal washings and aspirates are unacceptable for Xpert Xpress SARS-CoV-2/FLU/RSV testing.  Fact Sheet for Patients: BloggerCourse.com  Fact Sheet for Healthcare Providers: SeriousBroker.it  This test is not yet approved or cleared by the [place] FDA and has been authorized for detection and/or diagnosis of SARS-CoV-2 by FDA under an Emergency Use Authorization (EUA). This EUA will remain in effect (meaning this test can be used) for the duration of the COVID-19 declaration under Section 564(b)(1) of the Act, 21 U.S.C. section 360bbb-3(b)(1), unless the authorization is terminated or revoked.     Resp Syncytial Virus by PCR NEGATIVE NEGATIVE Final    Comment: (NOTE) Fact Sheet for Patients: BloggerCourse.com  Fact Sheet for Healthcare  Providers: SeriousBroker.it  This test is not yet approved or cleared by the [place] FDA and has been authorized for detection and/or diagnosis of SARS-CoV-2 by FDA under an Emergency Use Authorization (EUA). This EUA will remain in effect (meaning this test can be used) for the duration of the COVID-19 declaration under Section 564(b)(1) of the Act, 21 U.S.C. section 360bbb-3(b)(1), unless the authorization is terminated or revoked.  Performed at [facility], [address] [place], [place] [address]   Blood Culture (routine x 2)     Status: None (Preliminary result)   Collection Time: 03/03/23  9:32 AM   Specimen: BLOOD  Result Value Ref Range Status   Specimen Description BLOOD RIGHT ANTECUBITAL  Final   Special Requests   Final    BOTTLES DRAWN AEROBIC AND ANAEROBIC Blood Culture adequate volume   Culture   Final    NO GROWTH 2 DAYS Performed at [facility], [address], [place], [place] [address]    Report Status PENDING  Incomplete  Blood Culture (routine  x 2)     Status: None (Preliminary result)   Collection Time: 03/03/23  9:32 AM   Specimen: BLOOD  Result Value Ref Range Status   Specimen Description BLOOD RIGHT ANTECUBITAL  Final   Special Requests   Final    BOTTLES DRAWN AEROBIC AND ANAEROBIC Blood Culture results may not be optimal due to an excessive volume of blood received in culture bottles   Culture   Final    NO GROWTH 2 DAYS Performed at [facility], [address], [place], [place] [address]    Report Status PENDING  Incomplete         Radiology Studies: No results found.      Scheduled Meds:  amitriptyline  100 mg Oral QHS   aspirin EC  81 mg Oral Daily   atorvastatin  40 mg Oral Daily   buprenorphine  1 patch Transdermal Weekly   clopidogrel  75 mg Oral Daily   cyanocobalamin  1,000 mcg Oral Daily   enoxaparin (LOVENOX) injection  40 mg Subcutaneous Q24H   fluticasone  furoate-vilanterol  1 puff Inhalation Daily   And   umeclidinium bromide  1 puff Inhalation Daily   furosemide  40 mg Intravenous Daily   gabapentin  600 mg Oral BID   influenza vaccine adjuvanted  0.5 mL Intramuscular Tomorrow-1000   Ipratropium-Albuterol  1 puff Inhalation Q6H   magnesium oxide  400 mg Oral Daily   melatonin  5 mg Oral QHS   methylPREDNISolone (SOLU-MEDROL) injection  60 mg Intravenous Q24H   pregabalin  150 mg Oral TID   Vitamin E  400 Units Oral Daily   Continuous Infusions:  azithromycin Stopped (03/04/23 1415)   cefTRIAXone (ROCEPHIN)  IV 2 g (03/05/23 1331)     LOS: 2 days     Tresa Moore, MD [facility]   If 7PM-7AM, please contact night-coverage  03/05/2023, 1:54 PM

## 2023-03-05 NOTE — Progress Notes (Signed)
PHARMACIST - PHYSICIAN COMMUNICATION  CONCERNING: Antibiotic IV to Oral Route Change Policy  RECOMMENDATION: This patient is receiving azithromycin by the intravenous route.  Based on criteria approved by the Pharmacy and Therapeutics Committee, the antibiotic(s) is/are being converted to the equivalent oral dose form(s).  DESCRIPTION: These criteria include: Patient being treated for a respiratory tract infection, urinary tract infection, cellulitis or clostridium difficile associated diarrhea if on metronidazole The patient is not neutropenic and does not exhibit a GI malabsorption state The patient is eating (either orally or via tube) and/or has been taking other orally administered medications for a least 24 hours The patient is improving clinically and has a Tmax < 100.5  If you have questions about this conversion, please contact the Pharmacy Department   Tressie Ellis 03/05/23

## 2023-03-06 DIAGNOSIS — A419 Sepsis, unspecified organism: Secondary | ICD-10-CM | POA: Diagnosis not present

## 2023-03-06 DIAGNOSIS — R6520 Severe sepsis without septic shock: Secondary | ICD-10-CM | POA: Diagnosis not present

## 2023-03-06 LAB — CBC WITH DIFFERENTIAL/PLATELET
Abs Immature Granulocytes: 0.07 10*3/uL (ref 0.00–0.07)
Basophils Absolute: 0 10*3/uL (ref 0.0–0.1)
Basophils Relative: 0 %
Eosinophils Absolute: 0 10*3/uL (ref 0.0–0.5)
Eosinophils Relative: 0 %
HCT: 36.1 % (ref 36.0–46.0)
Hemoglobin: 11.4 g/dL — ABNORMAL LOW (ref 12.0–15.0)
Immature Granulocytes: 1 %
Lymphocytes Relative: 9 %
Lymphs Abs: 0.7 10*3/uL (ref 0.7–4.0)
MCH: 27.7 pg (ref 26.0–34.0)
MCHC: 31.6 g/dL (ref 30.0–36.0)
MCV: 87.8 fL (ref 80.0–100.0)
Monocytes Absolute: 0.3 10*3/uL (ref 0.1–1.0)
Monocytes Relative: 3 %
Neutro Abs: 7.5 10*3/uL (ref 1.7–7.7)
Neutrophils Relative %: 87 %
Platelets: 258 10*3/uL (ref 150–400)
RBC: 4.11 MIL/uL (ref 3.87–5.11)
RDW: 15.9 % — ABNORMAL HIGH (ref 11.5–15.5)
WBC: 8.6 10*3/uL (ref 4.0–10.5)
nRBC: 0 % (ref 0.0–0.2)

## 2023-03-06 LAB — BASIC METABOLIC PANEL
Anion gap: 7 (ref 5–15)
BUN: 24 mg/dL — ABNORMAL HIGH (ref 8–23)
CO2: 26 mmol/L (ref 22–32)
Calcium: 8.2 mg/dL — ABNORMAL LOW (ref 8.9–10.3)
Chloride: 102 mmol/L (ref 98–111)
Creatinine, Ser: 0.76 mg/dL (ref 0.44–1.00)
GFR, Estimated: >60 mL/min (ref >60)
Glucose, Bld: 157 mg/dL — ABNORMAL HIGH (ref 70–99)
Potassium: 4.3 mmol/L (ref 3.5–5.1)
Sodium: 135 mmol/L (ref 135–145)

## 2023-03-06 MED ORDER — LABETALOL HCL 5 MG/ML IV SOLN: 10.0000 mg | Freq: Four times a day (QID) | INTRAVENOUS | Status: DC | PRN

## 2023-03-06 MED ORDER — PREDNISONE 20 MG PO TABS
40.0000 mg | ORAL_TABLET | Freq: Every day | ORAL | Status: AC
  Administered 2023-03-07 – 2023-03-10 (×4): 40 mg via ORAL
  Filled 2023-03-06 (×4): qty 2

## 2023-03-06 MED ORDER — FUROSEMIDE 40 MG PO TABS
40.0000 mg | ORAL_TABLET | Freq: Every day | ORAL | Status: DC
  Administered 2023-03-06 – 2023-03-08 (×3): 40 mg via ORAL
  Filled 2023-03-06 (×3): qty 1

## 2023-03-06 NOTE — Progress Notes (Signed)
PROGRESS NOTE    Wendy Arias  ZOX:[id number] DOB: 12/18/1950 DOA: 03/03/2023 PCP: Wilford Corner, PA-C    Brief Narrative:    72 y.o. female with medical history significant of ischemic cardiomyopathy with preserved ejection fraction, COPD, depression, hepatic cirrhosis, hyperlipidemia, essential hypertension, iron deficiency anemia, depression with anxiety, osteoporosis, left ankle fracture recently who was brought to the ER with altered mental status.  Patient is on home O2 at 2 L.  In the ER however she was found to be confused.  She was scheduled to have an angiogram with vascular surgery this morning.  The daughter found her to be pale and hypoxic.  Patient also has recent left ankle fracture trimalleolar nature and was supposed to have surgery tomorrow.  While in the ER she was found to be hypoxic.  Patient also meets sepsis criteria with heart rate leukocytosis and fever.  She she turned out to have COVID-19 positive test results, also CTA showing pneumonia and urinalysis consistent with UTI.  Patient being admitted with sepsis due to pneumonia and UTI and most likely secondary to COVID-19 infection.  Patient was hypotensive initially requiring Levophed.  She has been titrated off Levophed at the moment and maintaining systolic blood pressure over 100 and MAP of more than 65.  ICU was initially consulted but now declined admission and return back to hospitalist admit.   9/6: Patient weaned off vasopressors.  Mentating more clearly.  Able to answer my questions appropriately.  Still feeling tachypneic, short of breath.  On 7 L.  9/7: Blood pressure stable.  Weaned to 1 L.  Much less tachypneic and short of breath.  9/8: Continues to clinically improved.  Remains on 1 L oxygen.   Assessment & Plan:   Principal Problem:   Severe sepsis (HCC) Active Problems:   COPD exacerbation (HCC)   Acute metabolic encephalopathy   Essential hypertension   HLD (hyperlipidemia)   CAD  (coronary artery disease)   Depression with anxiety   Ischemic cardiomyopathy   Atherosclerosis of artery of extremity with ulceration (HCC)   Closed left ankle fracture   COVID-19 virus infection  Severe sepsis Septic shock Community acquired pneumonia COVID-19 infection Urinary tract infection Multiple sources of infection.  My suspicion is patient developed a COVID-19 infection and concomitant bacterial pneumonia.  Unclear whether the urinalysis results are the primary driver for shock physiology.  However shock physiology has essentially resolved at this time.  Patient mentating much more clearly.  Lactic acid only 1.  Indicates good perfusion. Plan: No IVF Continue Rocephin and azithromycin Continue Lasix, changed to 40 mg p.o. daily Daily steroids, transition to p.o. prednisone 40 mg No indication for remdesivir Monitor vitals and fever curve  Acute hypoxic respiratory failure Likely multifactorial related to underlying COPD and COVID-19 infection and community-acquired pneumonia Was on 7 L high flow on admission.  Weaned to 1 L Plan: Continue oxygen, wean as tolerated Lasix, steroids, CAP treatment as above Patient with 2 L home requirement  COPD with acute exacerbation Patient wheezing on examination with increase in oxygen demand Suspect mild decompensation of COPD in the setting of acute infection Lung sounds much improved Plan: P.o. prednisone Bronchodilator regimen Wean oxygen as tolerated Lung sounds improving  Acute metabolic encephalopathy Mental status is much improved.   Patient able to answer my questions appropriately  Peripheral vascular disease Scheduled for left lower extremity angiogram.   Vascular surgery plans currently on hold Continue antiplatelet therapy  Left trimalleolar fracture Podiatry has delayed  surgical plans for now  Coronary artery disease Continue antiplatelet therapy  Essential hypertension Hold blood pressure medications  for now Monitor BP and restart as appropriate  Hyperlipidemia Statin  DVT prophylaxis: SQ lovenox Code Status: DNR Family Communication:Family member at bedside 9/6 Disposition Plan: Status is: Inpatient Remains inpatient appropriate because: Multiple acute issues as above   Level of care: Progressive  Consultants:  None  Procedures:  None  Antimicrobials: Rocephin Azithromycin    Subjective: Seen and examined.  No pain complaints.  Energy level improved.  Objective: Vitals:   03/05/23 2254 03/06/23 0340 03/06/23 0342 03/06/23 1214  BP: (!) 138/102  (!) 154/98 (!) 160/96  Pulse: 80  77 74  Resp: 20  18 18   Temp: 97.7 F (36.5 C)  98 F (36.7 C) 97.8 F (36.6 C)  TempSrc: Oral  Oral Oral  SpO2: 95% (!) 85% 92% 95%  Weight:      Height:        Intake/Output Summary (Last 24 hours) at 03/06/2023 1217 Last data filed at 03/06/2023 0800 Gross per 24 hour  Intake --  Output 1900 ml  Net -1900 ml   Filed Weights   03/03/23 0913  Weight: 68 kg    Examination:  General exam: No distress Respiratory system: Mild scattered crackles bilaterally.  No wheeze.  Normal work of breathing.  1 L Cardiovascular system: S1-S2, RRR, no murmurs, no pedal edema Gastrointestinal system: Soft, NT/ND, normal bowel sounds Central nervous system: Alert and oriented. No focal neurological deficits. Extremities: Symmetric 5 x 5 power. Skin: No rashes, lesions or ulcers Psychiatry: Judgement and insight appear normal. Mood & affect appropriate.     Data Reviewed: I have personally reviewed following labs and imaging studies  CBC: Recent Labs  Lab 03/03/23 0325 03/03/23 0932 03/04/23 0521 03/05/23 1005 03/06/23 1025  WBC 15.5* 8.4 13.8* 12.8* 8.6  NEUTROABS  --  7.5  --  11.1* 7.5  HGB 10.4* 12.3 9.6* 11.4* 11.4*  HCT 32.5* 40.1 29.8* 35.8* 36.1  MCV 86.9 89.9 89.0 88.2 87.8  PLT 249 276 215 257 258   Basic Metabolic Panel: Recent Labs  Lab 03/03/23 0325  03/03/23 0932 03/04/23 0521 03/05/23 1005 03/06/23 1025  NA  --  140 138 137 135  K  --  4.2 4.5 4.2 4.3  CL  --  109 107 100 102  CO2  --  22 25 26 26   GLUCOSE  --  85 111* 136* 157*  BUN  --  21 16 25* 24*  CREATININE 0.71 1.11* 0.76 0.84 0.76  CALCIUM  --  8.5* 8.1* 8.7* 8.2*   GFR: Estimated Creatinine Clearance: 57.5 mL/min (by C-G formula based on SCr of 0.76 mg/dL). Liver Function Tests: Recent Labs  Lab 03/03/23 0932 03/04/23 0521  AST 19 18  ALT 15 13  ALKPHOS 107 76  BILITOT 0.6 0.3  PROT 6.7 5.8*  ALBUMIN 3.3* 2.8*   No results for input(s): "LIPASE", "AMYLASE" in the last 168 hours. No results for input(s): "AMMONIA" in the last 168 hours. Coagulation Profile: Recent Labs  Lab 03/04/23 0521  INR 1.2   Cardiac Enzymes: No results for input(s): "CKTOTAL", "CKMB", "CKMBINDEX", "TROPONINI" in the last 168 hours. BNP (last 3 results) No results for input(s): "PROBNP" in the last 8760 hours. HbA1C: No results for input(s): "HGBA1C" in the last 72 hours. CBG: No results for input(s): "GLUCAP" in the last 168 hours. Lipid Profile: No results for input(s): "CHOL", "HDL", "LDLCALC", "TRIG", "CHOLHDL", "  LDLDIRECT" in the last 72 hours. Thyroid Function Tests: No results for input(s): "TSH", "T4TOTAL", "FREET4", "T3FREE", "THYROIDAB" in the last 72 hours. Anemia Panel: No results for input(s): "VITAMINB12", "FOLATE", "FERRITIN", "TIBC", "IRON", "RETICCTPCT" in the last 72 hours. Sepsis Labs: Recent Labs  Lab 03/03/23 0932 03/03/23 1409 03/04/23 0521  PROCALCITON  --   --  31.97  LATICACIDVEN 1.8 1.1  --     Recent Results (from the past 240 hour(s))  Resp panel by RT-PCR (RSV, Flu A&B, Covid) Anterior Nasal Swab     Status: Abnormal   Collection Time: 03/03/23  9:32 AM   Specimen: Anterior Nasal Swab  Result Value Ref Range Status   SARS Coronavirus 2 by RT PCR POSITIVE (A) NEGATIVE Final    Comment: (NOTE) SARS-CoV-2 target nucleic acids are  DETECTED.  The SARS-CoV-2 RNA is generally detectable in upper respiratory specimens during the acute phase of infection. Positive results are indicative of the presence of the identified virus, but do not rule out bacterial infection or co-infection with other pathogens not detected by the test. Clinical correlation with patient history and other diagnostic information is necessary to determine patient infection status. The expected result is Negative.  Fact Sheet for Patients: BloggerCourse.com  Fact Sheet for Healthcare Providers: SeriousBroker.it  This test is not yet approved or cleared by the [place] FDA and  has been authorized for detection and/or diagnosis of SARS-CoV-2 by FDA under an Emergency Use Authorization (EUA).  This EUA will remain in effect (meaning this test can be used) for the duration of  the COVID-19 declaration under Section 564(b)(1) of the A ct, 21 U.S.C. section 360bbb-3(b)(1), unless the authorization is terminated or revoked sooner.     Influenza A by PCR NEGATIVE NEGATIVE Final   Influenza B by PCR NEGATIVE NEGATIVE Final    Comment: (NOTE) The Xpert Xpress SARS-CoV-2/FLU/RSV plus assay is intended as an aid in the diagnosis of influenza from Nasopharyngeal swab specimens and should not be used as a sole basis for treatment. Nasal washings and aspirates are unacceptable for Xpert Xpress SARS-CoV-2/FLU/RSV testing.  Fact Sheet for Patients: BloggerCourse.com  Fact Sheet for Healthcare Providers: SeriousBroker.it  This test is not yet approved or cleared by the [place] FDA and has been authorized for detection and/or diagnosis of SARS-CoV-2 by FDA under an Emergency Use Authorization (EUA). This EUA will remain in effect (meaning this test can be used) for the duration of the COVID-19 declaration under Section 564(b)(1) of the Act, 21  U.S.C. section 360bbb-3(b)(1), unless the authorization is terminated or revoked.     Resp Syncytial Virus by PCR NEGATIVE NEGATIVE Final    Comment: (NOTE) Fact Sheet for Patients: BloggerCourse.com  Fact Sheet for Healthcare Providers: SeriousBroker.it  This test is not yet approved or cleared by the [place] FDA and has been authorized for detection and/or diagnosis of SARS-CoV-2 by FDA under an Emergency Use Authorization (EUA). This EUA will remain in effect (meaning this test can be used) for the duration of the COVID-19 declaration under Section 564(b)(1) of the Act, 21 U.S.C. section 360bbb-3(b)(1), unless the authorization is terminated or revoked.  Performed at [facility], [address] [place], [place] [address]   Blood Culture (routine x 2)     Status: None (Preliminary result)   Collection Time: 03/03/23  9:32 AM   Specimen: BLOOD  Result Value Ref Range Status   Specimen Description BLOOD RIGHT ANTECUBITAL  Final   Special Requests   Final  BOTTLES DRAWN AEROBIC AND ANAEROBIC Blood Culture adequate volume   Culture   Final    NO GROWTH 3 DAYS Performed at [facility], [address] [place], [place] [address]    Report Status PENDING  Incomplete  Blood Culture (routine x 2)     Status: None (Preliminary result)   Collection Time: 03/03/23  9:32 AM   Specimen: BLOOD  Result Value Ref Range Status   Specimen Description BLOOD RIGHT ANTECUBITAL  Final   Special Requests   Final    BOTTLES DRAWN AEROBIC AND ANAEROBIC Blood Culture results may not be optimal due to an excessive volume of blood received in culture bottles   Culture   Final    NO GROWTH 3 DAYS Performed at [facility], [address], [place], [place] [address]    Report Status PENDING  Incomplete         Radiology Studies: No results found.      Scheduled Meds:  amitriptyline  100 mg Oral  QHS   aspirin EC  81 mg Oral Daily   atorvastatin  40 mg Oral Daily   azithromycin  500 mg Oral Daily   buprenorphine  1 patch Transdermal Weekly   clopidogrel  75 mg Oral Daily   cyanocobalamin  1,000 mcg Oral Daily   enoxaparin (LOVENOX) injection  40 mg Subcutaneous Q24H   fluticasone furoate-vilanterol  1 puff Inhalation Daily   And   umeclidinium bromide  1 puff Inhalation Daily   furosemide  40 mg Oral Daily   gabapentin  600 mg Oral BID   influenza vaccine adjuvanted  0.5 mL Intramuscular Tomorrow-1000   Ipratropium-Albuterol  1 puff Inhalation Q6H   magnesium oxide  400 mg Oral Daily   melatonin  5 mg Oral QHS   [START ON 03/07/2023] predniSONE  40 mg Oral Q breakfast   pregabalin  150 mg Oral TID   Vitamin E  400 Units Oral Daily   Continuous Infusions:  cefTRIAXone (ROCEPHIN)  IV 2 g (03/05/23 1331)     LOS: 3 days     Tresa Moore, MD [facility]   If 7PM-7AM, please contact night-coverage  03/06/2023, 12:17 PM

## 2023-03-06 NOTE — Plan of Care (Signed)

## 2023-03-06 NOTE — Evaluation (Signed)
Occupational Therapy Evaluation Patient Details Name: Wendy Arias MRN: [id number] DOB: 11-12-1950 Today's Date: 03/06/2023   History of Present Illness Pt is a 72 y/o F admitted on 03/03/23 after presenting with c/o AMS. Pt found to be positive for COVID-19, CTA showing PNA & urinalysis consistent with UTI. Of note, pt with recent L ankle fx & podiatry holding sx for now; pt also with plans for LLE angiogram but this is on hold for now as well. PMH: ischemic cardiomyopathy, COPD, depression, hepatic cirrhosis, HLD, essential HTN, iron deficiency anemia, depression, anxiety, osteoporosis, 2L home O2 use   Clinical Impression   Pt was seen for OT evaluation this date. Pt received in recliner at end of PT evaluation and agreeable to participate. Prior to hospital admission, pt was primarily using a w/c due to recent ankle fracture. She notes her sister has been helping with tub transfers using TTB. Pt presents to acute OT demonstrating impaired ADL performance and functional mobility 2/2 decreased strength and AROM with L ankle pain/injury, activity tolerance, balance, and cardiopulm status requiring 2L O2 with all exertional activity and desaturation with limited exertion to mid 80's requiring rest and pursed lip breathing to recover to low to mid 90's. (See OT problem list for additional functional deficits). Pt currently requires MOD A for LB ADL tasks, MIN A for UB bathing, and set up for seated grooming tasks. PRN VC for PLB. Pt/sister educated in ECS (activity pacing, work simplification, PLB), AE/DME, and falls prevention. Pt/sister verbalized understanding. Pt would benefit from skilled OT services to address noted impairments and functional limitations (see below for any additional details) in order to maximize safety and independence while minimizing falls risk and caregiver burden.     If plan is discharge home, recommend the following: A little help with walking and/or transfers;A lot of help with  bathing/dressing/bathroom;Assistance with cooking/housework;Assist for transportation;Help with stairs or ramp for entrance    Functional Status Assessment  Patient has had a recent decline in their functional status and demonstrates the ability to make significant improvements in function in a reasonable and predictable amount of time.  Equipment Recommendations  Other (comment) (reacher, LH sponge, LH shoe horn, sock aide)    Recommendations for Other Services       Precautions / Restrictions Precautions Precautions: Fall Required Braces or Orthoses: Other Brace Other Brace: LLE cam boot Restrictions Weight Bearing Restrictions: Yes LLE Weight Bearing: Non weight bearing      Mobility Bed Mobility               General bed mobility comments: NT in recliner at start and end of session    Transfers Overall transfer level: Needs assistance Equipment used: Rolling walker (2 wheels) Transfers: Sit to/from Stand Sit to Stand: Min assist                  Balance Overall balance assessment: Needs assistance Sitting-balance support: Feet supported, No upper extremity supported Sitting balance-Leahy Scale: Fair                                     ADL either performed or assessed with clinical judgement   ADL Overall ADL's : Needs assistance/impaired     Grooming: Sitting;Modified independent   Upper Body Bathing: Sitting;Minimal assistance Upper Body Bathing Details (indicate cue type and reason): assist for washing back Lower Body Bathing: Sitting/lateral leans;Supervison/ safety;Set up;Sit to/from stand;Contact  guard assist Lower Body Bathing Details (indicate cue type and reason): CGA in standing with LLE NWBing and  LUE on RW for stability, able to complete pericare with R hand wihtout overt LOB Upper Body Dressing : Sitting;Modified independent   Lower Body Dressing: Sit to/from stand;Moderate assistance Lower Body Dressing Details  (indicate cue type and reason): MOD A to initiate over feet and complete over hips                     Vision         Perception         Praxis         Pertinent Vitals/Pain Pain Assessment Pain Assessment: 0-10 Pain Score: 7  Pain Location: L ankle Pain Descriptors / Indicators: Discomfort, Guarding Pain Intervention(s): Limited activity within patient's tolerance, Monitored during session, Premedicated before session, Repositioned     Extremity/Trunk Assessment Upper Extremity Assessment Upper Extremity Assessment: Overall WFL for tasks assessed   Lower Extremity Assessment Lower Extremity Assessment: LLE deficits/detail;Defer to PT evaluation LLE Deficits / Details: 2+/5 knee extension in sitting       Communication Communication Communication: No apparent difficulties   Cognition Arousal: Alert Behavior During Therapy: WFL for tasks assessed/performed Overall Cognitive Status: Within Functional Limits for tasks assessed                                       General Comments       Exercises Other Exercises Other Exercises: Pt/sister educated in ECS (activity pacing, work simplification, PLB), AE/DME, and falls prevention   Shoulder Instructions      Home Living Family/patient expects to be discharged to:: Private residence Living Arrangements: Children Available Help at Discharge: Family;Available PRN/intermittently Type of Home: House Home Access: Level entry     Home Layout: One level     Bathroom Shower/Tub: Tub/shower unit         Home Equipment: Rollator (4 wheels);Rolling Walker (2 wheels);Wheelchair - manual;BSC/3in1;Transport chair;Tub bench          Prior Functioning/Environment               Mobility Comments: Pt reports she d/c home with family assisting her, had 1 session of HHPT, was performing lateral scoots & using w/c. ADLs Comments: Since ankle fx sister has been helping wiht bathing, using  TTB        OT Problem List: Decreased strength;Pain;Cardiopulmonary status limiting activity;Decreased range of motion;Decreased activity tolerance;Impaired balance (sitting and/or standing);Decreased knowledge of use of DME or AE      OT Treatment/Interventions: Self-care/ADL training;Therapeutic exercise;Therapeutic activities;Energy conservation;DME and/or AE instruction;Patient/family education;Balance training    OT Goals(Current goals can be found in the care plan section) Acute Rehab OT Goals Patient Stated Goal: get better to have surgery OT Goal Formulation: With patient/family Time For Goal Achievement: 03/20/23 Potential to Achieve Goals: Good ADL Goals Pt Will Perform Lower Body Dressing: with modified independence;with adaptive equipment;sitting/lateral leans;sit to/from stand Pt Will Transfer to Toilet: with supervision;bedside commode;with transfer board;stand pivot transfer Pt Will Perform Toileting - Clothing Manipulation and hygiene: with modified independence;sitting/lateral leans Additional ADL Goal #1: Pt will verbalize plan to implement at least 2 learned ECS to support ADL/mobility and safety.  OT Frequency: Min 1X/week    Co-evaluation              AM-PAC OT "6 Clicks" Daily Activity  Outcome Measure Help from another person eating meals?: None Help from another person taking care of personal grooming?: None Help from another person toileting, which includes using toliet, bedpan, or urinal?: A Little Help from another person bathing (including washing, rinsing, drying)?: A Lot Help from another person to put on and taking off regular upper body clothing?: None Help from another person to put on and taking off regular lower body clothing?: A Lot 6 Click Score: 19   End of Session Equipment Utilized During Treatment: Rolling walker (2 wheels) Nurse Communication: Mobility status  Activity Tolerance: Patient tolerated treatment well Patient left: in  chair;with call bell/phone within reach;with chair alarm set;with family/visitor present  OT Visit Diagnosis: Other abnormalities of gait and mobility (R26.89);Muscle weakness (generalized) (M62.81);Pain Pain - Right/Left: Left Pain - part of body: Ankle and joints of foot                Time: 1128-1200 OT Time Calculation (min): 32 min Charges:  OT General Charges $OT Visit: 1 Visit OT Evaluation $OT Eval Moderate Complexity: 1 Mod OT Treatments $Self Care/Home Management : 23-37 mins  Arman Filter., MPH, MS, OTR/L ascom [phone] 03/06/23, 12:19 PM

## 2023-03-06 NOTE — Evaluation (Signed)
Physical Therapy Evaluation Patient Details Name: Wendy Arias MRN: [id number] DOB: 01-27-1951 Today's Date: 03/06/2023  History of Present Illness  Pt is a 72 y/o F admitted on 03/03/23 after presenting with c/o AMS. Pt found to be positive for COVID-19, CTA showing PNA & urinalysis consistent with UTI. Of note, pt with recent L ankle fx & podiatry holding sx for now; pt also with plans for LLE angiogram but this is on hold for now as well. PMH: ischemic cardiomyopathy, COPD, depression, hepatic cirrhosis, HLD, essential HTN, iron deficiency anemia, depression, anxiety, osteoporosis, 2L home O2 use  Clinical Impression  Pt seen for PT evaluation with pt agreeable, sister present for session. Pt reports prior to this admission family was assisting with lateral scoots & pt using w/c for mobility. On this date, pt is able to complete STS with mod assist with RW & attempt hopping with RLE but pt reports fear with mobility so pt returned to sitting & performed lateral scoot bed>drop arm recliner with supervision. Pt received in room on room air but does require supplemental O2 (see below) & PT educated pt on need to wear O2. Pt reports she wants to go home at d/c so recommend HHPT f/u if possible.  Room air semi folwer in bed SpO2 90-91% Supine>sit EOB on room air SpO2 dropped to 83% & pt c/o SOB, placed on 2L/min supplemental O2 with improvement in SpO2 Transferring to recliner on 2L/min, SPO2 dropped to 83% but pt able to recover with rest & pursed lip breathing      If plan is discharge home, recommend the following: A lot of help with walking and/or transfers;A lot of help with bathing/dressing/bathroom;Assistance with cooking/housework;Assist for transportation;Help with stairs or ramp for entrance   Can travel by private vehicle        Equipment Recommendations None recommended by PT  Recommendations for Other Services       Functional Status Assessment Patient has had a recent decline in  their functional status and demonstrates the ability to make significant improvements in function in a reasonable and predictable amount of time.     Precautions / Restrictions Precautions Precautions: Fall Required Braces or Orthoses: Other Brace Other Brace: LLE cam boot Restrictions Weight Bearing Restrictions: Yes LLE Weight Bearing: Non weight bearing      Mobility  Bed Mobility Overal bed mobility: Needs Assistance Bed Mobility: Supine to Sit     Supine to sit: Supervision, Used rails, HOB elevated          Transfers Overall transfer level: Needs assistance Equipment used: Rolling walker (2 wheels) Transfers: Sit to/from Stand, Bed to chair/wheelchair/BSC Sit to Stand: Mod assist          Lateral/Scoot Transfers: Supervision (bed>drop arm recliner on R) General transfer comment: STS from slightly elevated EOB, cuing for hand placement, NWB LLE, technique. Pt attempts to hop to R with RW & min assist with ongoing cuing for NWB LLE with fair return demo but pt unable to fully pivot to recliner reporting fear with movement. Pt returned to sitting & performed lateral scoot bed>drop arm recliner on R with supervision with good ability to maintain NWB LLE. Pt reports more comfort with lateral scoots.    Ambulation/Gait                  Stairs            Wheelchair Mobility     Tilt Bed    Modified Rankin (Stroke Patients  Only)       Balance Overall balance assessment: Needs assistance Sitting-balance support: Feet supported, Bilateral upper extremity supported Sitting balance-Leahy Scale: Fair     Standing balance support: During functional activity, Bilateral upper extremity supported, Reliant on assistive device for balance Standing balance-Leahy Scale: Fair                               Pertinent Vitals/Pain Pain Assessment Pain Assessment: Faces Pain Score: 7  Pain Location: L ankle Pain Descriptors / Indicators:  Discomfort, Guarding Pain Intervention(s): Monitored during session, Premedicated before session    Home Living Family/patient expects to be discharged to:: Private residence Living Arrangements: Children Available Help at Discharge: Family;Available PRN/intermittently Type of Home: House Home Access: Level entry       Home Layout: One level Home Equipment: Rollator (4 wheels);Rolling Walker (2 wheels);Wheelchair - manual;BSC/3in1;Transport chair      Prior Function               Mobility Comments: Pt reports she d/c home with family assisting her, had 1 session of HHPT, was performing lateral scoots & using w/c. ADLs Comments: Since ankle fdx sister has been helping wiht bathing, using TTB     Extremity/Trunk Assessment   Upper Extremity Assessment Upper Extremity Assessment: Overall WFL for tasks assessed    Lower Extremity Assessment Lower Extremity Assessment: Generalized weakness;Overall WFL for tasks assessed;LLE deficits/detail LLE Deficits / Details: 2+/5 knee extension in sitting       Communication   Communication Communication: No apparent difficulties  Cognition Arousal: Alert Behavior During Therapy: WFL for tasks assessed/performed Overall Cognitive Status: Within Functional Limits for tasks assessed                                          General Comments      Exercises     Assessment/Plan    PT Assessment Patient needs continued PT services  PT Problem List Decreased strength;Cardiopulmonary status limiting activity;Pain;Decreased activity tolerance;Decreased balance;Decreased mobility;Decreased knowledge of precautions;Decreased safety awareness;Decreased knowledge of use of DME       PT Treatment Interventions DME instruction;Balance training;Neuromuscular re-education;Stair training;Gait training;Functional mobility training;Patient/family education;Therapeutic activities;Manual techniques;Therapeutic  exercise;Wheelchair mobility training;Modalities    PT Goals (Current goals can be found in the Care Plan section)  Acute Rehab PT Goals Patient Stated Goal: get better, go home PT Goal Formulation: With patient/family Time For Goal Achievement: 03/20/23 Potential to Achieve Goals: Good    Frequency Min 1X/week     Co-evaluation               AM-PAC PT "6 Clicks" Mobility  Outcome Measure Help needed turning from your back to your side while in a flat bed without using bedrails?: None Help needed moving from lying on your back to sitting on the side of a flat bed without using bedrails?: A Little Help needed moving to and from a bed to a chair (including a wheelchair)?: A Little Help needed standing up from a chair using your arms (e.g., wheelchair or bedside chair)?: A Little Help needed to walk in hospital room?: A Lot Help needed climbing 3-5 steps with a railing? : Total 6 Click Score: 16    End of Session Equipment Utilized During Treatment: Oxygen;Gait belt Activity Tolerance: Patient tolerated treatment well Patient left: in chair;with call  bell/phone within reach;with family/visitor present (in handoff to OT) Nurse Communication: Mobility status (O2) PT Visit Diagnosis: Muscle weakness (generalized) (M62.81);Pain;Other abnormalities of gait and mobility (R26.89);Difficulty in walking, not elsewhere classified (R26.2) Pain - Right/Left: Left Pain - part of body: Ankle and joints of foot    Time: 4098-1191 PT Time Calculation (min) (ACUTE ONLY): 17 min   Charges:   PT Evaluation $PT Eval Low Complexity: 1 Low   PT General Charges $$ ACUTE PT VISIT: 1 Visit         Aleda Grana, PT, DPT 03/06/23, 12:04 PM   Sandi Mariscal 03/06/2023, 11:57 AM

## 2023-03-07 ENCOUNTER — Encounter
Admission: EM | Disposition: A | Discharge status: Home Health | Payer: Self-pay | Source: Home / Self Care | Attending: Internal Medicine

## 2023-03-07 DIAGNOSIS — Z9889 Other specified postprocedural states: Secondary | ICD-10-CM

## 2023-03-07 DIAGNOSIS — I70245 Atherosclerosis of native arteries of left leg with ulceration of other part of foot: Secondary | ICD-10-CM | POA: Diagnosis not present

## 2023-03-07 DIAGNOSIS — L089 Local infection of the skin and subcutaneous tissue, unspecified: Secondary | ICD-10-CM | POA: Diagnosis not present

## 2023-03-07 DIAGNOSIS — A419 Sepsis, unspecified organism: Secondary | ICD-10-CM | POA: Diagnosis not present

## 2023-03-07 DIAGNOSIS — I743 Embolism and thrombosis of arteries of the lower extremities: Secondary | ICD-10-CM | POA: Diagnosis not present

## 2023-03-07 DIAGNOSIS — L97529 Non-pressure chronic ulcer of other part of left foot with unspecified severity: Secondary | ICD-10-CM

## 2023-03-07 DIAGNOSIS — R6520 Severe sepsis without septic shock: Secondary | ICD-10-CM | POA: Diagnosis not present

## 2023-03-07 DIAGNOSIS — Z95828 Presence of other vascular implants and grafts: Secondary | ICD-10-CM

## 2023-03-07 HISTORY — PX: LOWER EXTREMITY ANGIOGRAPHY: CATH118251

## 2023-03-07 LAB — BUN: BUN: 29 mg/dL — ABNORMAL HIGH (ref 8–23)

## 2023-03-07 LAB — CREATININE, SERUM
Creatinine, Ser: 0.83 mg/dL (ref 0.44–1.00)
GFR, Estimated: >60 mL/min (ref >60)

## 2023-03-07 LAB — PROCALCITONIN: Procalcitonin: 6.13 ng/mL

## 2023-03-07 SURGERY — LOWER EXTREMITY ANGIOGRAPHY
Anesthesia: Moderate Sedation | Laterality: Left

## 2023-03-07 MED ORDER — FAMOTIDINE 20 MG PO TABS: 40.0000 mg | ORAL_TABLET | Freq: Once | ORAL | Status: DC | PRN

## 2023-03-07 MED ORDER — LIDOCAINE-EPINEPHRINE (PF) 1 %-1:200000 IJ SOLN
INTRAMUSCULAR | Status: DC | PRN
  Administered 2023-03-07: 10 mL via INTRADERMAL

## 2023-03-07 MED ORDER — FENTANYL CITRATE PF 50 MCG/ML IJ SOSY
PREFILLED_SYRINGE | INTRAMUSCULAR | Status: AC
  Filled 2023-03-07: qty 2

## 2023-03-07 MED ORDER — MIDAZOLAM HCL 5 MG/5ML IJ SOLN
INTRAMUSCULAR | Status: AC
  Filled 2023-03-07: qty 5

## 2023-03-07 MED ORDER — NITROGLYCERIN 1 MG/10 ML FOR IR/CATH LAB
INTRA_ARTERIAL | Status: DC | PRN
  Administered 2023-03-07: 300 ug via INTRA_ARTERIAL

## 2023-03-07 MED ORDER — FAMOTIDINE 20 MG PO TABS
20.0000 mg | ORAL_TABLET | Freq: Once | ORAL | Status: AC
  Administered 2023-03-07: 20 mg via ORAL
  Filled 2023-03-07: qty 1

## 2023-03-07 MED ORDER — HEPARIN (PORCINE) IN NACL 2000-0.9 UNIT/L-% IV SOLN
INTRAVENOUS | Status: DC | PRN
  Administered 2023-03-07: 1000 mL

## 2023-03-07 MED ORDER — MIDAZOLAM HCL 2 MG/2ML IJ SOLN
INTRAMUSCULAR | Status: DC | PRN
  Administered 2023-03-07 (×2): .5 mg via INTRAVENOUS
  Administered 2023-03-07: 1 mg via INTRAVENOUS

## 2023-03-07 MED ORDER — METHYLPREDNISOLONE SODIUM SUCC 125 MG IJ SOLR: 125.0000 mg | Freq: Once | INTRAMUSCULAR | Status: DC | PRN

## 2023-03-07 MED ORDER — DIPHENHYDRAMINE HCL 50 MG/ML IJ SOLN: 50.0000 mg | Freq: Once | INTRAMUSCULAR | Status: DC | PRN

## 2023-03-07 MED ORDER — HEPARIN SODIUM (PORCINE) 1000 UNIT/ML IJ SOLN
INTRAMUSCULAR | Status: AC
  Filled 2023-03-07: qty 10

## 2023-03-07 MED ORDER — HEPARIN SODIUM (PORCINE) 1000 UNIT/ML IJ SOLN
INTRAMUSCULAR | Status: DC | PRN
  Administered 2023-03-07: 5000 [IU] via INTRAVENOUS

## 2023-03-07 MED ORDER — SODIUM CHLORIDE 0.9 % IV SOLN: INTRAVENOUS | Status: DC

## 2023-03-07 MED ORDER — ONDANSETRON HCL 4 MG/2ML IJ SOLN: 4.0000 mg | Freq: Four times a day (QID) | INTRAMUSCULAR | Status: DC | PRN

## 2023-03-07 MED ORDER — MIDAZOLAM HCL 2 MG/ML PO SYRP: 8.0000 mg | ORAL_SOLUTION | Freq: Once | ORAL | Status: DC | PRN

## 2023-03-07 MED ORDER — FENTANYL CITRATE (PF) 100 MCG/2ML IJ SOLN
INTRAMUSCULAR | Status: DC | PRN
  Administered 2023-03-07 (×3): 25 ug via INTRAVENOUS

## 2023-03-07 MED ORDER — HYDROMORPHONE HCL 1 MG/ML IJ SOLN
1.0000 mg | Freq: Once | INTRAMUSCULAR | Status: AC | PRN
  Administered 2023-03-11: 1 mg via INTRAVENOUS
  Filled 2023-03-07: qty 1

## 2023-03-07 MED ORDER — CEFAZOLIN SODIUM-DEXTROSE 2-4 GM/100ML-% IV SOLN
INTRAVENOUS | Status: AC
  Filled 2023-03-07: qty 100

## 2023-03-07 MED ORDER — IODIXANOL 320 MG/ML IV SOLN
INTRAVENOUS | Status: DC | PRN
  Administered 2023-03-07: 55 mL via INTRA_ARTERIAL

## 2023-03-07 MED ORDER — CEFAZOLIN SODIUM-DEXTROSE 2-4 GM/100ML-% IV SOLN
2.0000 g | INTRAVENOUS | Status: AC
  Administered 2023-03-07: 2 g via INTRAVENOUS

## 2023-03-07 MED ORDER — NITROGLYCERIN 1 MG/10 ML FOR IR/CATH LAB
INTRA_ARTERIAL | Status: AC
  Filled 2023-03-07: qty 10

## 2023-03-07 SURGICAL SUPPLY — 26 items
BALLN LUTONIX 018 4X100X130 (BALLOONS) ×1
BALLN ULTRVRSE 018 2.5X100X150 (BALLOONS) ×1
BALLOON LUTONIX 018 4X100X130 (BALLOONS) IMPLANT
BALLOON ULTRVS 018 2.5X100X150 (BALLOONS) IMPLANT
CANISTER PENUMBRA ENGINE (MISCELLANEOUS) IMPLANT
CANNULA 5F STIFF (CANNULA) IMPLANT
CATH ANGIO 5F PIGTAIL 65CM (CATHETERS) IMPLANT
CATH BEACON 5 .035 65 KMP TIP (CATHETERS) IMPLANT
CATH BEACON 5 .038 100 VERT TP (CATHETERS) IMPLANT
CATH INDIGO CAT6 KIT (CATHETERS) IMPLANT
CATH TEMPO 5F RIM 65CM (CATHETERS) IMPLANT
CATH VS15FR (CATHETERS) IMPLANT
COVER PROBE ULTRASOUND 5X96 (MISCELLANEOUS) IMPLANT
DEVICE STARCLOSE SE CLOSURE (Vascular Products) IMPLANT
GLIDEWIRE ADV .035X180CM (WIRE) IMPLANT
GUIDEWIRE AMPLATZ SHORT (WIRE) IMPLANT
KIT ENCORE 26 ADVANTAGE (KITS) IMPLANT
PACK ANGIOGRAPHY (CUSTOM PROCEDURE TRAY) ×1 IMPLANT
SHEATH ANL2 6FRX45 HC (SHEATH) IMPLANT
SHEATH BRITE TIP 5FRX11 (SHEATH) IMPLANT
STENT VIABAHN 5X100X120 (Permanent Stent) IMPLANT
SYR MEDRAD MARK 7 150ML (SYRINGE) IMPLANT
TUBING CONTRAST HIGH PRESS 72 (TUBING) IMPLANT
WIRE G V18X300CM (WIRE) IMPLANT
WIRE GUIDERIGHT .035X150 (WIRE) IMPLANT
WIRE SUPRACORE 300CM (WIRE) IMPLANT

## 2023-03-07 NOTE — Progress Notes (Addendum)
Physical Therapy Treatment Patient Details Name: Wendy Arias MRN: [id number] DOB: 1951/04/02 Today's Date: 03/07/2023   History of Present Illness Pt is a 72 y/o F admitted on 03/03/23 after presenting with c/o AMS. Pt found to be positive for COVID-19, CTA showing PNA & urinalysis consistent with UTI. Of note, pt with recent L ankle fx & podiatry holding sx for now; pt also with plans for LLE angiogram but this is on hold for now as well. PMH: ischemic cardiomyopathy, COPD, depression, hepatic cirrhosis, HLD, essential HTN, iron deficiency anemia, depression, anxiety, osteoporosis, 2L home O2 use    PT Comments  Pt presents laying in bed, 10/10 pain in L ankle, SPO2 90% on RA. Pt politely declines standing mobility due to pain but willing to participate in EOB scooting and therEx. She performed supine<>sit with supervision for safety due to pain levels. PT monitored O2 throughout, noting drop to high 80s (lowest 87%) during supine>sit and lateral scooting, requiring frequent rest breaks for O2 recovery. PT donned 2L O2 while seated EOB and O2 remained >92% for remainder of session. She would benefit from continued skilled care to maximize functional abilities.    If plan is discharge home, recommend the following: A lot of help with walking and/or transfers;A lot of help with bathing/dressing/bathroom;Assistance with cooking/housework;Assist for transportation;Help with stairs or ramp for entrance;Direct supervision/assist for medications management   Can travel by private vehicle        Equipment Recommendations  None recommended by PT    Recommendations for Other Services       Precautions / Restrictions Precautions Precautions: Fall Required Braces or Orthoses: Other Brace Other Brace: LLE cam boot Restrictions Weight Bearing Restrictions: Yes LLE Weight Bearing: Non weight bearing     Mobility  Bed Mobility Overal bed mobility: Needs Assistance Bed Mobility: Supine to Sit, Sit to  Supine     Supine to sit: Supervision, Used rails, HOB elevated Sit to supine: Supervision, Used rails   General bed mobility comments: supervision for safety and due to pain levels    Transfers                  Lateral/Scoot Transfers: Supervision General transfer comment: Scooting EOB 5x B/L, performed 2x with supervision and cueing for RLE to assist with movement    Ambulation/Gait                   Stairs             Wheelchair Mobility     Tilt Bed    Modified Rankin (Stroke Patients Only)       Balance   Sitting-balance support: Feet supported, Bilateral upper extremity supported, Feet unsupported Sitting balance-Leahy Scale: Fair Sitting balance - Comments: intermittent posterior lean that improves with R foot support and cueing Postural control: Posterior lean                                  Cognition Arousal: Alert Behavior During Therapy: WFL for tasks assessed/performed Overall Cognitive Status: Within Functional Limits for tasks assessed                                          Exercises General Exercises - Lower Extremity Quad Sets: Strengthening, Both, 10 reps, Supine Gluteal Sets: Strengthening, Both, 10 reps,  Supine Long Arc Quad: Strengthening, Both, 10 reps, Seated Straight Leg Raises: Strengthening, Both, 10 reps, Supine    General Comments General comments (skin integrity, edema, etc.): On RA upon entry, SPO2 initially 90%. Pt attempted supine> sit and seated therex on RA but would drop to high 80s (lowest 87%) and require frequent rest breaks. PT donned 2L O2 and remained >92% for remainder of session      Pertinent Vitals/Pain Pain Assessment Pain Assessment: 0-10 Pain Score: 10-Worst pain ever Pain Location: L ankle Pain Descriptors / Indicators: Discomfort, Guarding, Throbbing, Constant Pain Intervention(s): Limited activity within patient's tolerance, Monitored during session,  Repositioned    Home Living                          Prior Function            PT Goals (current goals can now be found in the care plan section) Acute Rehab PT Goals Patient Stated Goal: get better, go home Progress towards PT goals: Progressing toward goals    Frequency    Min 1X/week      PT Plan      Co-evaluation              AM-PAC PT "6 Clicks" Mobility   Outcome Measure  Help needed turning from your back to your side while in a flat bed without using bedrails?: None Help needed moving from lying on your back to sitting on the side of a flat bed without using bedrails?: A Little Help needed moving to and from a bed to a chair (including a wheelchair)?: A Lot Help needed standing up from a chair using your arms (e.g., wheelchair or bedside chair)?: A Lot Help needed to walk in hospital room?: A Lot Help needed climbing 3-5 steps with a railing? : Total 6 Click Score: 14    End of Session Equipment Utilized During Treatment: Oxygen;Gait belt Activity Tolerance: Patient tolerated treatment well Patient left: in bed;with call bell/phone within reach;with bed alarm set Nurse Communication: Mobility status (oxygen response to activity and pain levels) PT Visit Diagnosis: Muscle weakness (generalized) (M62.81);Pain;Other abnormalities of gait and mobility (R26.89);Difficulty in walking, not elsewhere classified (R26.2) Pain - Right/Left: Left Pain - part of body: Ankle and joints of foot     Time: 0865-7846 PT Time Calculation (min) (ACUTE ONLY): 29 min  Charges:    $Therapeutic Exercise: 8-22 mins $Therapeutic Activity: 8-22 mins PT General Charges $$ ACUTE PT VISIT: 1 Visit                     Jayshawn Colston, PT, SPT 12:10 PM,03/07/23

## 2023-03-07 NOTE — Progress Notes (Signed)
PROGRESS NOTE    Wendy Arias  WJX:[id number] DOB: Dec 12, 1950 DOA: 03/03/2023 PCP: Wilford Corner, PA-C    Brief Narrative:    72 y.o. female with medical history significant of ischemic cardiomyopathy with preserved ejection fraction, COPD, depression, hepatic cirrhosis, hyperlipidemia, essential hypertension, iron deficiency anemia, depression with anxiety, osteoporosis, left ankle fracture recently who was brought to the ER with altered mental status.  Patient is on home O2 at 2 L.  In the ER however she was found to be confused.  She was scheduled to have an angiogram with vascular surgery this morning.  The daughter found her to be pale and hypoxic.  Patient also has recent left ankle fracture trimalleolar nature and was supposed to have surgery tomorrow.  While in the ER she was found to be hypoxic.  Patient also meets sepsis criteria with heart rate leukocytosis and fever.  She she turned out to have COVID-19 positive test results, also CTA showing pneumonia and urinalysis consistent with UTI.  Patient being admitted with sepsis due to pneumonia and UTI and most likely secondary to COVID-19 infection.  Patient was hypotensive initially requiring Levophed.  She has been titrated off Levophed at the moment and maintaining systolic blood pressure over 100 and MAP of more than 65.  ICU was initially consulted but now declined admission and return back to hospitalist admit.   9/6: Patient weaned off vasopressors.  Mentating more clearly.  Able to answer my questions appropriately.  Still feeling tachypneic, short of breath.  On 7 L.  9/7: Blood pressure stable.  Weaned to 1 L.  Much less tachypneic and short of breath.  9/8: Continues to clinically improved.  Remains on 1 L oxygen. 9/9: Continues to clinically improve.  Oxygen status improved.  No complaints of pain in left ankle.  Communicated with podiatry and vascular surgery.  Tentative plan for lower extremity angiography  9/9   Assessment & Plan:   Principal Problem:   Severe sepsis (HCC) Active Problems:   COPD exacerbation (HCC)   Acute metabolic encephalopathy   Essential hypertension   HLD (hyperlipidemia)   CAD (coronary artery disease)   Depression with anxiety   Ischemic cardiomyopathy   Atherosclerosis of artery of extremity with ulceration (HCC)   Closed left ankle fracture   COVID-19 virus infection  Severe sepsis Septic shock Community acquired pneumonia COVID-19 infection Urinary tract infection Multiple sources of infection.  My suspicion is patient developed a COVID-19 infection and concomitant bacterial pneumonia.  Unclear whether the urinalysis results are the primary driver for shock physiology.  However shock physiology has essentially resolved at this time.  Patient mentating much more clearly.  Lactic acid only 1.  Indicates good perfusion. Plan: No IVF Continue Rocephin and azithromycin Continue Lasix 40 mg p.o. daily Continue prednisone 40 mg p.o. daily No indication for remdesivir Monitor vitals and fever curve  Acute hypoxic respiratory failure Likely multifactorial related to underlying COPD and COVID-19 infection and community-acquired pneumonia Was on 7 L high flow on admission.  Weaned to 1 L Plan: Continue oxygen, wean as tolerated Lasix, steroids, Treatment as above Patient with 2 L home requirement  COPD with acute exacerbation Patient wheezing on examination with increase in oxygen demand Suspect mild decompensation of COPD in the setting of acute infection Lung sounds much improved Plan: P.o. prednisone, 5-day course Scheduled and as needed bronchodilator regimen Wean oxygen as tolerated Lung sounds improving  Acute metabolic encephalopathy Mental status is much improved.   Patient able  to answer my questions appropriately Appears at baseline level of mentation  Peripheral vascular disease Scheduled for left lower extremity angiogram.    Discussed with [facility] surgery.  Will attempt to get patient on schedule 9/9 PM N.p.o. for now  Left trimalleolar fracture Discussed with Dr. Ether Griffins.  If patient remains admitted possible ORIF left ankle at the end of this week.  If not she may be able to discharge and follow-up outpatient  Coronary artery disease Continue antiplatelet therapy  Essential hypertension On Lasix as above  Hyperlipidemia Statin  DVT prophylaxis: SQ lovenox Code Status: DNR Family Communication:Family member at bedside 9/6 Disposition Plan: Status is: Inpatient Remains inpatient appropriate because: Multiple acute issues as above   Level of care: Progressive  Consultants:  None  Procedures:  None  Antimicrobials: Rocephin Azithromycin    Subjective: Seen and examined.  Complains of pain in left ankle  Objective: Vitals:   03/06/23 2349 03/07/23 0355 03/07/23 0857 03/07/23 1236  BP: (!) 144/98 (!) 150/95 (!) 160/100 124/88  Pulse: 71 67 76 71  Resp: 18 18 20 18   Temp: 97.7 F (36.5 C) 97.7 F (36.5 C) 98 F (36.7 C) 98.1 F (36.7 C)  TempSrc: Oral Oral  Oral  SpO2: 92% 93% 92% (!) 87%  Weight:      Height:        Intake/Output Summary (Last 24 hours) at 03/07/2023 1257 Last data filed at 03/07/2023 0355 Gross per 24 hour  Intake 240 ml  Output 1050 ml  Net -810 ml   Filed Weights   03/03/23 0913  Weight: 68 kg    Examination:  General exam: No acute distress Respiratory system: Mild scattered crackles bilaterally.  No wheeze.  Normal work of breathing.  Room air Cardiovascular system: S1-S2, RRR, no murmurs, no pedal edema Gastrointestinal system: Soft, NT/ND, normal bowel sounds Central nervous system: Alert and oriented. No focal neurological deficits. Extremities: Symmetric 5 x 5 power. Skin: No rashes, lesions or ulcers Psychiatry: Judgement and insight appear normal. Mood & affect appropriate.     Data Reviewed: I have personally reviewed following labs and  imaging studies  CBC: Recent Labs  Lab 03/03/23 0325 03/03/23 0932 03/04/23 0521 03/05/23 1005 03/06/23 1025  WBC 15.5* 8.4 13.8* 12.8* 8.6  NEUTROABS  --  7.5  --  11.1* 7.5  HGB 10.4* 12.3 9.6* 11.4* 11.4*  HCT 32.5* 40.1 29.8* 35.8* 36.1  MCV 86.9 89.9 89.0 88.2 87.8  PLT 249 276 215 257 258   Basic Metabolic Panel: Recent Labs  Lab 03/03/23 0325 03/03/23 0932 03/04/23 0521 03/05/23 1005 03/06/23 1025  NA  --  140 138 137 135  K  --  4.2 4.5 4.2 4.3  CL  --  109 107 100 102  CO2  --  22 25 26 26   GLUCOSE  --  85 111* 136* 157*  BUN  --  21 16 25* 24*  CREATININE 0.71 1.11* 0.76 0.84 0.76  CALCIUM  --  8.5* 8.1* 8.7* 8.2*   GFR: Estimated Creatinine Clearance: 57.5 mL/min (by C-G formula based on SCr of 0.76 mg/dL). Liver Function Tests: Recent Labs  Lab 03/03/23 0932 03/04/23 0521  AST 19 18  ALT 15 13  ALKPHOS 107 76  BILITOT 0.6 0.3  PROT 6.7 5.8*  ALBUMIN 3.3* 2.8*   No results for input(s): "LIPASE", "AMYLASE" in the last 168 hours. No results for input(s): "AMMONIA" in the last 168 hours. Coagulation Profile: Recent Labs  Lab 03/04/23 [phone]  INR 1.2   Cardiac Enzymes: No results for input(s): "CKTOTAL", "CKMB", "CKMBINDEX", "TROPONINI" in the last 168 hours. BNP (last 3 results) No results for input(s): "PROBNP" in the last 8760 hours. HbA1C: No results for input(s): "HGBA1C" in the last 72 hours. CBG: No results for input(s): "GLUCAP" in the last 168 hours. Lipid Profile: No results for input(s): "CHOL", "HDL", "LDLCALC", "TRIG", "CHOLHDL", "LDLDIRECT" in the last 72 hours. Thyroid Function Tests: No results for input(s): "TSH", "T4TOTAL", "FREET4", "T3FREE", "THYROIDAB" in the last 72 hours. Anemia Panel: No results for input(s): "VITAMINB12", "FOLATE", "FERRITIN", "TIBC", "IRON", "RETICCTPCT" in the last 72 hours. Sepsis Labs: Recent Labs  Lab 03/03/23 0932 03/03/23 1409 03/04/23 0521 03/07/23 0341  PROCALCITON  --   --  31.97  6.13  LATICACIDVEN 1.8 1.1  --   --     Recent Results (from the past 240 hour(s))  Resp panel by RT-PCR (RSV, Flu A&B, Covid) Anterior Nasal Swab     Status: Abnormal   Collection Time: 03/03/23  9:32 AM   Specimen: Anterior Nasal Swab  Result Value Ref Range Status   SARS Coronavirus 2 by RT PCR POSITIVE (A) NEGATIVE Final    Comment: (NOTE) SARS-CoV-2 target nucleic acids are DETECTED.  The SARS-CoV-2 RNA is generally detectable in upper respiratory specimens during the acute phase of infection. Positive results are indicative of the presence of the identified virus, but do not rule out bacterial infection or co-infection with other pathogens not detected by the test. Clinical correlation with patient history and other diagnostic information is necessary to determine patient infection status. The expected result is Negative.  Fact Sheet for Patients: BloggerCourse.com  Fact Sheet for Healthcare Providers: SeriousBroker.it  This test is not yet approved or cleared by the [place] FDA and  has been authorized for detection and/or diagnosis of SARS-CoV-2 by FDA under an Emergency Use Authorization (EUA).  This EUA will remain in effect (meaning this test can be used) for the duration of  the COVID-19 declaration under Section 564(b)(1) of the A ct, 21 U.S.C. section 360bbb-3(b)(1), unless the authorization is terminated or revoked sooner.     Influenza A by PCR NEGATIVE NEGATIVE Final   Influenza B by PCR NEGATIVE NEGATIVE Final    Comment: (NOTE) The Xpert Xpress SARS-CoV-2/FLU/RSV plus assay is intended as an aid in the diagnosis of influenza from Nasopharyngeal swab specimens and should not be used as a sole basis for treatment. Nasal washings and aspirates are unacceptable for Xpert Xpress SARS-CoV-2/FLU/RSV testing.  Fact Sheet for Patients: BloggerCourse.com  Fact Sheet for Healthcare  Providers: SeriousBroker.it  This test is not yet approved or cleared by the [place] FDA and has been authorized for detection and/or diagnosis of SARS-CoV-2 by FDA under an Emergency Use Authorization (EUA). This EUA will remain in effect (meaning this test can be used) for the duration of the COVID-19 declaration under Section 564(b)(1) of the Act, 21 U.S.C. section 360bbb-3(b)(1), unless the authorization is terminated or revoked.     Resp Syncytial Virus by PCR NEGATIVE NEGATIVE Final    Comment: (NOTE) Fact Sheet for Patients: BloggerCourse.com  Fact Sheet for Healthcare Providers: SeriousBroker.it  This test is not yet approved or cleared by the [place] FDA and has been authorized for detection and/or diagnosis of SARS-CoV-2 by FDA under an Emergency Use Authorization (EUA). This EUA will remain in effect (meaning this test can be used) for the duration of the COVID-19 declaration under Section 564(b)(1) of the Act, 21  U.S.C. section 360bbb-3(b)(1), unless the authorization is terminated or revoked.  Performed at [facility], [address] [place], [place] [address]   Blood Culture (routine x 2)     Status: None (Preliminary result)   Collection Time: 03/03/23  9:32 AM   Specimen: BLOOD  Result Value Ref Range Status   Specimen Description BLOOD RIGHT ANTECUBITAL  Final   Special Requests   Final    BOTTLES DRAWN AEROBIC AND ANAEROBIC Blood Culture adequate volume   Culture   Final    NO GROWTH 4 DAYS Performed at [facility], [address], [place], [place] [address]    Report Status PENDING  Incomplete  Blood Culture (routine x 2)     Status: None (Preliminary result)   Collection Time: 03/03/23  9:32 AM   Specimen: BLOOD  Result Value Ref Range Status   Specimen Description BLOOD RIGHT ANTECUBITAL  Final   Special Requests   Final    BOTTLES DRAWN  AEROBIC AND ANAEROBIC Blood Culture results may not be optimal due to an excessive volume of blood received in culture bottles   Culture   Final    NO GROWTH 4 DAYS Performed at [facility], [address] [place], [place] [address]    Report Status PENDING  Incomplete         Radiology Studies: No results found.      Scheduled Meds:  amitriptyline  100 mg Oral QHS   aspirin EC  81 mg Oral Daily   atorvastatin  40 mg Oral Daily   buprenorphine  1 patch Transdermal Weekly   clopidogrel  75 mg Oral Daily   cyanocobalamin  1,000 mcg Oral Daily   enoxaparin (LOVENOX) injection  40 mg Subcutaneous Q24H   fluticasone furoate-vilanterol  1 puff Inhalation Daily   And   umeclidinium bromide  1 puff Inhalation Daily   furosemide  40 mg Oral Daily   gabapentin  600 mg Oral BID   influenza vaccine adjuvanted  0.5 mL Intramuscular Tomorrow-1000   Ipratropium-Albuterol  1 puff Inhalation Q6H   magnesium oxide  400 mg Oral Daily   melatonin  5 mg Oral QHS   predniSONE  40 mg Oral Q breakfast   pregabalin  150 mg Oral TID   Vitamin E  400 Units Oral Daily   Continuous Infusions:  sodium chloride      ceFAZolin (ANCEF) IV     cefTRIAXone (ROCEPHIN)  IV 2 g (03/06/23 1505)     LOS: 4 days     Tresa Moore, MD [facility]   If 7PM-7AM, please contact night-coverage  03/07/2023, 12:57 PM

## 2023-03-07 NOTE — Interval H&P Note (Signed)
History and Physical Interval Note:  03/07/2023 2:24 PM  Wendy Arias  has presented today for surgery, with the diagnosis of PAD.  The various methods of treatment have been discussed with the patient and family. After consideration of risks, benefits and other options for treatment, the patient has consented to  Procedure(s): Lower Extremity Angiography (Left) as a surgical intervention.  The patient's history has been reviewed, patient examined, no change in status, stable for surgery.  I have reviewed the patient's chart and labs.  Questions were answered to the patient's satisfaction.     Festus Barren

## 2023-03-07 NOTE — Op Note (Signed)
[facility]  Percutaneous Study/Intervention Procedural Note   Date of Surgery: 03/07/2023  Surgeon(s):Koleson Reifsteck    Assistants:none  Pre-operative Diagnosis: PAD with ulceration LLE  Post-operative diagnosis:  Same  Procedure(s) Performed:             1.  Ultrasound guidance for vascular access right femoral artery             2.  Catheter placement into left common femoral artery from right femoral approach             3.  Aortogram and selective left lower extremity angiogram             4.  Mechanical thrombectomy using the penumbra CAT 6 device to the left SFA, popliteal artery, tibioperoneal trunk, and posterior tibial arteries             5.  Percutaneous transluminal angioplasty of the left posterior tibial artery with 2.5 mm diameter by 8 cm length angioplasty balloon  6.  Stent placement to the left SFA with 5 mm diameter by 10 cm length Viabahn stent             7.  StarClose closure device right femoral artery  EBL: 100 cc  Contrast: 55 cc  Fluoro Time: 11.1 minutes  Moderate Conscious Sedation Time: approximately 47 minutes using 2 mg of Versed and 75 mcg of Fentanyl              Indications:  Patient is a 72 y.o.female with a long history of peripheral arterial disease and a nonhealing ulceration with infection of the left foot. The patient is brought in for angiography for further evaluation and potential treatment.  Due to the limb threatening nature of the situation, angiogram was performed for attempted limb salvage. The patient is aware that if the procedure fails, amputation would be expected.  The patient also understands that even with successful revascularization, amputation may still be required due to the severity of the situation.  Risks and benefits are discussed and informed consent is obtained.   Procedure:  The patient was identified and appropriate procedural time out was performed.  The patient was then placed supine on the table  and prepped and draped in the usual sterile fashion. Moderate conscious sedation was administered during a face to face encounter with the patient throughout the procedure with my supervision of the RN administering medicines and monitoring the patient's vital signs, pulse oximetry, telemetry and mental status throughout from the start of the procedure until the patient was taken to the recovery room. Ultrasound was used to evaluate the right common femoral artery.  It was patent .  A digital ultrasound image was acquired.  A Seldinger needle was used to access the right common femoral artery under direct ultrasound guidance and a permanent image was performed.  A 0.035 J wire was advanced without resistance and a 5Fr sheath was placed.  Pigtail catheter was placed into the aorta and an AP aortogram was performed. This demonstrated normal renal arteries patent aorta and iliac segments after previous extensive iliac stent placement to the distal aorta.  The stents were widely patent. I then crossed the aortic bifurcation which was difficult due to the stents in the distal aorta.  Ultimately I was able to get a V S1 catheter and an advantage wire over the bifurcation and then exchanged for a Kumpe catheter and advanced to the left femoral head. Selective left lower extremity angiogram was then  performed. This demonstrated occlusion of the mid left SFA which was clearly thrombotic in nature with extremely sluggish runoff distally. It was felt that it was in the patient's best interest to proceed with intervention after these images to avoid a second procedure and a larger amount of contrast and fluoroscopy based off of the findings from the initial angiogram. The patient was systemically heparinized and a 6 [place] Ansell sheath was then placed over the [employer] wire. I then used a Kumpe catheter and the advantage wire to easily cross the occlusion and get a wire and catheter down into the tibial vessels.  There  was thrombus downstream in the tibioperoneal trunk and distal popliteal artery but there was reconstitution of the posterior tibial and peroneal arteries distally.  I exchanged for a V18 wire and perform mechanical thrombectomy with the penumbra CAT 6 device with several passes with the thrombectomy catheter in the left SFA, popliteal artery, tibioperoneal trunk, and posterior tibial arteries.  After the initial passes, there uncovered a moderate stenosis in the left SFA which was likely the area that thrombosed initially.  This had about a 60% residual stenosis and would need a covered stent.  There remained thrombus in the tibioperoneal trunk and proximal posterior tibial artery.  Another pass with the penumbra CAT 6 device down in this location resulted in resolution of the thrombus.  There was stenosis in the posterior tibial artery of about 80% that was then treated with a 2.5 mm diameter by 8 cm length angioplasty balloon inflated to 10 atm for 1 minute and left posterior tibial artery.  Less than 20% residual stenosis was present and now both the posterior tibial and peroneal arteries are patent distally.  The anterior tibial artery is patent proximally but occluded in the midsegment without distal reconstitution.  I elected to place a covered stent over the lesion in the mid SFA which was likely the culprit that thrombosed.  A 5 mm diameter by 10 cm length Viabahn stent was deployed and postdilated with a 4 mm diameter Lutonix drug-coated balloon with excellent angiographic completion result and less than 10% residual stenosis. I elected to terminate the procedure. The sheath was removed and StarClose closure device was deployed in the right femoral artery with excellent hemostatic result. The patient was taken to the recovery room in stable condition having tolerated the procedure well.  Findings:               Aortogram:  This demonstrated normal renal arteries patent aorta and iliac segments after  previous extensive iliac stent placement to the distal aorta.  The stents were widely patent.             Left lower Extremity: Thrombosis of a moderate stenosis in the left SFA with extension of thrombus down to the popliteal artery, tibioperoneal trunk, and proximal posterior tibial artery.  Revascularization successful and patient now has two-vessel runoff distally   Disposition: Patient was taken to the recovery room in stable condition having tolerated the procedure well.  Complications: None  Festus Barren 03/07/2023 3:57 PM   This note was created with Dragon Medical transcription system. Any errors in dictation are purely unintentional.

## 2023-03-07 NOTE — Plan of Care (Signed)
  Problem: Fluid Volume: Goal: Hemodynamic stability will improve 03/07/2023 1757 by Sherre Scarlet, RN Outcome: Progressing 03/07/2023 1724 by Sherre Scarlet, RN Outcome: Progressing   Problem: Clinical Measurements: Goal: Diagnostic test results will improve 03/07/2023 1757 by Sherre Scarlet, RN Outcome: Progressing 03/07/2023 1724 by Sherre Scarlet, RN Outcome: Progressing Goal: Signs and symptoms of infection will decrease 03/07/2023 1757 by Sherre Scarlet, RN Outcome: Progressing 03/07/2023 1724 by Sherre Scarlet, RN Outcome: Progressing   Problem: Respiratory: Goal: Ability to maintain adequate ventilation will improve 03/07/2023 1757 by Sherre Scarlet, RN Outcome: Progressing 03/07/2023 1724 by Sherre Scarlet, RN Outcome: Progressing   Problem: Education: Goal: Knowledge of General Education information will improve Description: Including pain rating scale, medication(s)/side effects and non-pharmacologic comfort measures 03/07/2023 1757 by Sherre Scarlet, RN Outcome: Progressing 03/07/2023 1724 by Sherre Scarlet, RN Outcome: Progressing   Problem: Health Behavior/Discharge Planning: Goal: Ability to manage health-related needs will improve 03/07/2023 1757 by Sherre Scarlet, RN Outcome: Progressing 03/07/2023 1724 by Sherre Scarlet, RN Outcome: Progressing   Problem: Clinical Measurements: Goal: Ability to maintain clinical measurements within normal limits will improve 03/07/2023 1757 by Sherre Scarlet, RN Outcome: Progressing 03/07/2023 1724 by Sherre Scarlet, RN Outcome: Progressing Goal: Will remain free from infection 03/07/2023 1757 by Sherre Scarlet, RN Outcome: Progressing 03/07/2023 1724 by Sherre Scarlet, RN Outcome: Progressing Goal: Diagnostic test results will improve 03/07/2023 1757 by Sherre Scarlet, RN Outcome: Progressing 03/07/2023 1724 by Sherre Scarlet, RN Outcome: Progressing Goal: Respiratory complications will improve 03/07/2023 1757 by Sherre Scarlet, RN Outcome: Progressing 03/07/2023 1724 by Sherre Scarlet, RN Outcome: Progressing Goal: Cardiovascular complication will be avoided 03/07/2023 1757 by Sherre Scarlet, RN Outcome: Progressing 03/07/2023 1724 by Sherre Scarlet, RN Outcome: Progressing   Problem: Activity: Goal: Risk for activity intolerance will decrease 03/07/2023 1757 by Sherre Scarlet, RN Outcome: Progressing 03/07/2023 1724 by Sherre Scarlet, RN Outcome: Progressing   Problem: Nutrition: Goal: Adequate nutrition will be maintained 03/07/2023 1757 by Sherre Scarlet, RN Outcome: Progressing 03/07/2023 1724 by Sherre Scarlet, RN Outcome: Progressing   Problem: Coping: Goal: Level of anxiety will decrease 03/07/2023 1757 by Sherre Scarlet, RN Outcome: Progressing 03/07/2023 1724 by Sherre Scarlet, RN Outcome: Progressing   Problem: Elimination: Goal: Will not experience complications related to bowel motility 03/07/2023 1757 by Sherre Scarlet, RN Outcome: Progressing 03/07/2023 1724 by Sherre Scarlet, RN Outcome: Progressing Goal: Will not experience complications related to urinary retention 03/07/2023 1757 by Sherre Scarlet, RN Outcome: Progressing 03/07/2023 1724 by Sherre Scarlet, RN Outcome: Progressing   Problem: Pain Managment: Goal: General experience of comfort will improve 03/07/2023 1757 by Sherre Scarlet, RN Outcome: Progressing 03/07/2023 1724 by Sherre Scarlet, RN Outcome: Progressing   Problem: Safety: Goal: Ability to remain free from injury will improve 03/07/2023 1757 by Sherre Scarlet, RN Outcome: Progressing 03/07/2023 1724 by Sherre Scarlet, RN Outcome: Progressing   Problem: Skin Integrity: Goal: Risk for impaired skin integrity will decrease 03/07/2023 1757 by Sherre Scarlet, RN Outcome: Progressing 03/07/2023 1724 by Sherre Scarlet, RN Outcome: Progressing

## 2023-03-07 NOTE — Plan of Care (Signed)

## 2023-03-08 ENCOUNTER — Encounter: Payer: Self-pay | Admitting: Vascular Surgery

## 2023-03-08 DIAGNOSIS — A419 Sepsis, unspecified organism: Secondary | ICD-10-CM | POA: Diagnosis not present

## 2023-03-08 DIAGNOSIS — R6520 Severe sepsis without septic shock: Secondary | ICD-10-CM | POA: Diagnosis not present

## 2023-03-08 LAB — CULTURE, BLOOD (ROUTINE X 2)
Culture: NO GROWTH
Culture: NO GROWTH
Special Requests: ADEQUATE

## 2023-03-08 LAB — PROCALCITONIN: Procalcitonin: 2.51 ng/mL

## 2023-03-08 MED ORDER — SENNOSIDES-DOCUSATE SODIUM 8.6-50 MG PO TABS: 2.0000 | ORAL_TABLET | Freq: Every evening | ORAL | Status: DC | PRN

## 2023-03-08 MED ORDER — IPRATROPIUM-ALBUTEROL 20-100 MCG/ACT IN AERS: 1.0000 | INHALATION_SPRAY | Freq: Four times a day (QID) | RESPIRATORY_TRACT | Status: DC | PRN

## 2023-03-08 MED ORDER — POLYETHYLENE GLYCOL 3350 17 G PO PACK
17.0000 g | PACK | Freq: Every day | ORAL | Status: DC
  Filled 2023-03-08 (×3): qty 1

## 2023-03-08 MED ORDER — CLOPIDOGREL BISULFATE 75 MG PO TABS: 75.0000 mg | ORAL_TABLET | Freq: Every day | ORAL | Status: DC

## 2023-03-08 NOTE — Progress Notes (Signed)
OT Cancellation Note  Patient Details Name: Wendy Arias MRN: [id number] DOB: 11/26/1950   Cancelled Treatment:    Reason Eval/Treat Not Completed: Fatigue/lethargy limiting ability to participate (Pt recieved in bed, stating 7/10 pain in LLE, feels as though she cannot participate in therapy today. OT attempts to encourge OOB mobility and ADL participation, with pt polietely declining. Would like to participate tomorrow if possible.)  Victorino Dike L. Jessika Rothery, OTR/L  03/08/23, 2:02 PM

## 2023-03-08 NOTE — Progress Notes (Signed)
PROGRESS NOTE    Wendy Arias  ZOX:[id number] DOB: 09-02-1950 DOA: 03/03/2023 PCP: Wilford Corner, PA-C    Brief Narrative:    72 y.o. female with medical history significant of ischemic cardiomyopathy with preserved ejection fraction, COPD, depression, hepatic cirrhosis, hyperlipidemia, essential hypertension, iron deficiency anemia, depression with anxiety, osteoporosis, left ankle fracture recently who was brought to the ER with altered mental status.  Patient is on home O2 at 2 L.  In the ER however she was found to be confused.  She was scheduled to have an angiogram with vascular surgery this morning.  The daughter found her to be pale and hypoxic.  Patient also has recent left ankle fracture trimalleolar nature and was supposed to have surgery tomorrow.  While in the ER she was found to be hypoxic.  Patient also meets sepsis criteria with heart rate leukocytosis and fever.  She she turned out to have COVID-19 positive test results, also CTA showing pneumonia and urinalysis consistent with UTI.  Patient being admitted with sepsis due to pneumonia and UTI and most likely secondary to COVID-19 infection.  Patient was hypotensive initially requiring Levophed.  She has been titrated off Levophed at the moment and maintaining systolic blood pressure over 100 and MAP of more than 65.  ICU was initially consulted but now declined admission and return back to hospitalist admit.   9/6: Patient weaned off vasopressors.  Mentating more clearly.  Able to answer my questions appropriately.  Still feeling tachypneic, short of breath.  On 7 L.  9/7: Blood pressure stable.  Weaned to 1 L.  Much less tachypneic and short of breath.  9/8: Continues to clinically improved.  Remains on 1 L oxygen. 9/9: Continues to clinically improve.  Oxygen status improved.  No complaints of pain in left ankle.  Communicated with podiatry and vascular surgery.  Tentative plan for lower extremity angiography 9/9 9/10:  Status post lower extremity angiography 9/9.  Status post PTA of posterior left tibial artery and stent placement to left SFA   Assessment & Plan:   Principal Problem:   Severe sepsis (HCC) Active Problems:   COPD exacerbation (HCC)   Acute metabolic encephalopathy   Essential hypertension   HLD (hyperlipidemia)   CAD (coronary artery disease)   Depression with anxiety   Ischemic cardiomyopathy   Atherosclerosis of artery of extremity with ulceration (HCC)   Closed left ankle fracture   COVID-19 virus infection  Severe sepsis Septic shock Community acquired pneumonia COVID-19 infection Urinary tract infection Multiple sources of infection.  My suspicion is patient developed a COVID-19 infection and concomitant bacterial pneumonia.  Unclear whether the urinalysis results are the primary driver for shock physiology.  However shock physiology has essentially resolved at this time.  Patient mentating much more clearly.  Lactic acid only 1.  Indicates good perfusion. Plan: No IVF Completed course of azithromycin and Rocephin Stop Lasix Continue prednisone 40 mg p.o. daily x 5 total days No indication for remdesivir Monitor vitals and fever curve  Acute on chronic hypoxic respiratory failure Likely multifactorial related to underlying COPD and COVID-19 infection and community-acquired pneumonia Was on 7 L high flow on admission.   Weaned to room air Patient apparently has a 2 L requirement but is saturating adequately on room air Plan: Oxygen if necessary Steroids as above  COPD with acute exacerbation Patient wheezing on examination with increase in oxygen demand Suspect mild decompensation of COPD in the setting of acute infection Lung sounds much improved  Plan: Prednisone 40 mg daily x 5 days Scheduled and as needed bronchodilator regimen Wean oxygen as tolerated Lung sounds improving  Left trimalleolar fracture Discussed case with Dr. Ether Griffins.  For now can justify  continued inpatient management due to patient's pain control needs.  If patient were to remain hospitalized Dr. Ether Griffins would be able to perform ORIF in OR on Friday 9/13.  Will need to hold Plavix on Thursday and then again on Friday with plans to restart on Saturday  Acute metabolic encephalopathy Mental status is much improved.   Patient able to answer my questions appropriately Appears at baseline level of mentation  Peripheral vascular disease Status post left lower extremity angiogram with angioplasty and stent placement on 9/9.  On Plavix.  In order to facilitate surgical planning will need to hold Plavix on Thursday and then again on Friday  Coronary artery disease Continue antiplatelet therapy  Essential hypertension BP controlled  Hyperlipidemia Statin  DVT prophylaxis: SQ lovenox Code Status: DNR Family Communication:Family member at bedside 9/6.  Offered to call family, patient has declined offer Disposition Plan: Status is: Inpatient Remains inpatient appropriate because: Intractable pain from left lower extremity   Level of care: Progressive  Consultants:  None  Procedures:  None  Antimicrobials:   Subjective: Seen and examined.  Pain control has improved  Objective: Vitals:   03/07/23 2304 03/08/23 0441 03/08/23 0806 03/08/23 1208  BP: (!) 141/92 132/84 107/75 (!) 126/101  Pulse: 73 70 73 80  Resp: 18 20 18 18   Temp: (!) 97.2 F (36.2 C) (!) 97.5 F (36.4 C) 97.8 F (36.6 C) 97.8 F (36.6 C)  TempSrc:   Oral   SpO2: 91% (!) 89% 93% 90%  Weight:      Height:        Intake/Output Summary (Last 24 hours) at 03/08/2023 1248 Last data filed at 03/07/2023 1759 Gross per 24 hour  Intake 164.6 ml  Output --  Net 164.6 ml   Filed Weights   03/03/23 0913  Weight: 68 kg    Examination:  General exam: NAD Respiratory system: Lungs clear.  Normal work of breathing.  Room air Cardiovascular system: S1-S2, RRR, no murmurs, no pedal  edema Gastrointestinal system: Soft, NT/ND, normal bowel sounds Central nervous system: Alert and oriented. No focal neurological deficits. Extremities: Left foot pain, decreased range of motion Skin: No rashes, lesions or ulcers Psychiatry: Judgement and insight appear normal. Mood & affect appropriate.     Data Reviewed: I have personally reviewed following labs and imaging studies  CBC: Recent Labs  Lab 03/03/23 0325 03/03/23 0932 03/04/23 0521 03/05/23 1005 03/06/23 1025  WBC 15.5* 8.4 13.8* 12.8* 8.6  NEUTROABS  --  7.5  --  11.1* 7.5  HGB 10.4* 12.3 9.6* 11.4* 11.4*  HCT 32.5* 40.1 29.8* 35.8* 36.1  MCV 86.9 89.9 89.0 88.2 87.8  PLT 249 276 215 257 258   Basic Metabolic Panel: Recent Labs  Lab 03/03/23 0932 03/04/23 0521 03/05/23 1005 03/06/23 1025 03/07/23 1302  NA 140 138 137 135  --   K 4.2 4.5 4.2 4.3  --   CL 109 107 100 102  --   CO2 22 25 26 26   --   GLUCOSE 85 111* 136* 157*  --   BUN 21 16 25* 24* 29*  CREATININE 1.11* 0.76 0.84 0.76 0.83  CALCIUM 8.5* 8.1* 8.7* 8.2*  --    GFR: Estimated Creatinine Clearance: 55.4 mL/min (by C-G formula based on SCr of 0.83  mg/dL). Liver Function Tests: Recent Labs  Lab 03/03/23 0932 03/04/23 0521  AST 19 18  ALT 15 13  ALKPHOS 107 76  BILITOT 0.6 0.3  PROT 6.7 5.8*  ALBUMIN 3.3* 2.8*   No results for input(s): "LIPASE", "AMYLASE" in the last 168 hours. No results for input(s): "AMMONIA" in the last 168 hours. Coagulation Profile: Recent Labs  Lab 03/04/23 0521  INR 1.2   Cardiac Enzymes: No results for input(s): "CKTOTAL", "CKMB", "CKMBINDEX", "TROPONINI" in the last 168 hours. BNP (last 3 results) No results for input(s): "PROBNP" in the last 8760 hours. HbA1C: No results for input(s): "HGBA1C" in the last 72 hours. CBG: No results for input(s): "GLUCAP" in the last 168 hours. Lipid Profile: No results for input(s): "CHOL", "HDL", "LDLCALC", "TRIG", "CHOLHDL", "LDLDIRECT" in the last 72  hours. Thyroid Function Tests: No results for input(s): "TSH", "T4TOTAL", "FREET4", "T3FREE", "THYROIDAB" in the last 72 hours. Anemia Panel: No results for input(s): "VITAMINB12", "FOLATE", "FERRITIN", "TIBC", "IRON", "RETICCTPCT" in the last 72 hours. Sepsis Labs: Recent Labs  Lab 03/03/23 0932 03/03/23 1409 03/04/23 0521 03/07/23 0341 03/08/23 0434  PROCALCITON  --   --  31.97 6.13 2.51  LATICACIDVEN 1.8 1.1  --   --   --     Recent Results (from the past 240 hour(s))  Resp panel by RT-PCR (RSV, Flu A&B, Covid) Anterior Nasal Swab     Status: Abnormal   Collection Time: 03/03/23  9:32 AM   Specimen: Anterior Nasal Swab  Result Value Ref Range Status   SARS Coronavirus 2 by RT PCR POSITIVE (A) NEGATIVE Final    Comment: (NOTE) SARS-CoV-2 target nucleic acids are DETECTED.  The SARS-CoV-2 RNA is generally detectable in upper respiratory specimens during the acute phase of infection. Positive results are indicative of the presence of the identified virus, but do not rule out bacterial infection or co-infection with other pathogens not detected by the test. Clinical correlation with patient history and other diagnostic information is necessary to determine patient infection status. The expected result is Negative.  Fact Sheet for Patients: BloggerCourse.com  Fact Sheet for Healthcare Providers: SeriousBroker.it  This test is not yet approved or cleared by the [place] FDA and  has been authorized for detection and/or diagnosis of SARS-CoV-2 by FDA under an Emergency Use Authorization (EUA).  This EUA will remain in effect (meaning this test can be used) for the duration of  the COVID-19 declaration under Section 564(b)(1) of the A ct, 21 U.S.C. section 360bbb-3(b)(1), unless the authorization is terminated or revoked sooner.     Influenza A by PCR NEGATIVE NEGATIVE Final   Influenza B by PCR NEGATIVE NEGATIVE Final     Comment: (NOTE) The Xpert Xpress SARS-CoV-2/FLU/RSV plus assay is intended as an aid in the diagnosis of influenza from Nasopharyngeal swab specimens and should not be used as a sole basis for treatment. Nasal washings and aspirates are unacceptable for Xpert Xpress SARS-CoV-2/FLU/RSV testing.  Fact Sheet for Patients: BloggerCourse.com  Fact Sheet for Healthcare Providers: SeriousBroker.it  This test is not yet approved or cleared by the [place] FDA and has been authorized for detection and/or diagnosis of SARS-CoV-2 by FDA under an Emergency Use Authorization (EUA). This EUA will remain in effect (meaning this test can be used) for the duration of the COVID-19 declaration under Section 564(b)(1) of the Act, 21 U.S.C. section 360bbb-3(b)(1), unless the authorization is terminated or revoked.     Resp Syncytial Virus by PCR NEGATIVE NEGATIVE Final  Comment: (NOTE) Fact Sheet for Patients: BloggerCourse.com  Fact Sheet for Healthcare Providers: SeriousBroker.it  This test is not yet approved or cleared by the [place] FDA and has been authorized for detection and/or diagnosis of SARS-CoV-2 by FDA under an Emergency Use Authorization (EUA). This EUA will remain in effect (meaning this test can be used) for the duration of the COVID-19 declaration under Section 564(b)(1) of the Act, 21 U.S.C. section 360bbb-3(b)(1), unless the authorization is terminated or revoked.  Performed at [facility], [address] [place], [place] [address]   Blood Culture (routine x 2)     Status: None   Collection Time: 03/03/23  9:32 AM   Specimen: BLOOD  Result Value Ref Range Status   Specimen Description BLOOD RIGHT ANTECUBITAL  Final   Special Requests   Final    BOTTLES DRAWN AEROBIC AND ANAEROBIC Blood Culture adequate volume   Culture   Final    NO GROWTH 5  DAYS Performed at [facility], [address] [place], [place] [address]    Report Status 03/08/2023 FINAL  Final  Blood Culture (routine x 2)     Status: None   Collection Time: 03/03/23  9:32 AM   Specimen: BLOOD  Result Value Ref Range Status   Specimen Description BLOOD RIGHT ANTECUBITAL  Final   Special Requests   Final    BOTTLES DRAWN AEROBIC AND ANAEROBIC Blood Culture results may not be optimal due to an excessive volume of blood received in culture bottles   Culture   Final    NO GROWTH 5 DAYS Performed at [facility], [address] [place], [place] [address]    Report Status 03/08/2023 FINAL  Final         Radiology Studies: PERIPHERAL VASCULAR CATHETERIZATION  Result Date: 03/07/2023 See surgical note for result.       Scheduled Meds:  amitriptyline  100 mg Oral QHS   aspirin EC  81 mg Oral Daily   atorvastatin  40 mg Oral Daily   buprenorphine  1 patch Transdermal Weekly   clopidogrel  75 mg Oral Daily   cyanocobalamin  1,000 mcg Oral Daily   enoxaparin (LOVENOX) injection  40 mg Subcutaneous Q24H   fluticasone furoate-vilanterol  1 puff Inhalation Daily   And   umeclidinium bromide  1 puff Inhalation Daily   furosemide  40 mg Oral Daily   gabapentin  600 mg Oral BID   influenza vaccine adjuvanted  0.5 mL Intramuscular Tomorrow-1000   Ipratropium-Albuterol  1 puff Inhalation Q6H   magnesium oxide  400 mg Oral Daily   melatonin  5 mg Oral QHS   predniSONE  40 mg Oral Q breakfast   pregabalin  150 mg Oral TID   Vitamin E  400 Units Oral Daily   Continuous Infusions:     LOS: 5 days     Tresa Moore, MD [facility]   If 7PM-7AM, please contact night-coverage  03/08/2023, 12:48 PM

## 2023-03-08 NOTE — TOC Progression Note (Addendum)
Transition of Care [facility]) - Progression Note    Patient Details  Name: Wendy Arias MRN: [id number] Date of Birth: November 29, 1950  Transition of Care [facility] CM/SW Contact  Truddie Hidden, RN Phone Number: 03/08/2023, 3:28 PM  Clinical Narrative:    Spoke with patient regarding therapy recommendations for [facility]. She initially declined HH but feels she could benefit from [facility] at discharge. Patient is agreeable to [facility] and does not have a preference. She had questions about co-payments. RNCM advised information was unable regarding co-payments. She was advised if accepted [facility] would contact her at discharge to schedule an appointment.   Referral sent to [facility] from [place].    Expected Discharge Plan: Home w Home Health Services Barriers to Discharge: No Barriers Identified  Expected Discharge Plan and Services       Living arrangements for the past 2 months: Single Family Home                   DME Agency: [employer]                   Social Determinants of Health (SDOH) Interventions SDOH Screenings   Food Insecurity: No Food Insecurity (03/04/2023)  Housing: Low Risk  (03/04/2023)  Transportation Needs: No Transportation Needs (03/04/2023)  Utilities: Not At Risk (03/04/2023)  Depression (PHQ2-9): Low Risk  (01/27/2023)  Financial Resource Strain: High Risk (02/16/2018)  Physical Activity: Inactive (02/16/2018)  Social Connections: Unknown (02/16/2018)  Stress: Stress Concern Present (02/16/2018)  Tobacco Use: Medium Risk (03/04/2023)    Readmission Risk Interventions    07/14/2022   11:37 AM  Readmission Risk Prevention Plan  Transportation Screening Complete  PCP or Specialist Appt within 5-7 Days Complete  Home Care Screening Complete  Medication Review (RN CM) Complete

## 2023-03-08 NOTE — Plan of Care (Signed)
  Problem: Education: Goal: Knowledge of General Education information will improve Description Including pain rating scale, medication(s)/side effects and non-pharmacologic comfort measures Outcome: Progressing   

## 2023-03-08 NOTE — Plan of Care (Signed)

## 2023-03-08 NOTE — Progress Notes (Signed)
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 The patient is now POD#1 from an Aortogram with selective left lower extremity angiogram with mechanical thrombectomy of the left SFA, popliteal artery, tibioperoneal trunk and posterior tibial arteries. Also had angioplasty with stent placement to the left SFA.   On exam today patient is resting comfortably in bed. She does endorse her left foot feels warmer. She also endorses pain at rest at her ankle. She understands this pain won't be fixed until she has surgery on her left ankle. She states she is planning on going to rehab after ankle surgery.     Vitals:   03/08/23 0806 03/08/23 1208  BP: 107/75 (!) 126/101  Pulse: 73 80  Resp: 18 18  Temp: 97.8 F (36.6 C) 97.8 F (36.6 C)  SpO2: 93% 90%   Physical Exam: Cardiac: RRR, tachycardia at 100, without  Murmurs, rubs or gallops; without carotid bruits  Lungs:  normal non-labored breathing, without Rales, rhonchi, wheezing  Incisions:  Right Groin with bandage clean dry and intact.  Extremities:  Left lower extremity warm to touch. Positive doppler PT/DP pulses. Slight ankle erythema.  Abdomen:  Positive bowel sounds throughout, soft, non tender and non distended Neurologic: AAOX3,   CBC    Component Value Date/Time   WBC 8.6 03/06/2023 1025   RBC 4.11 03/06/2023 1025   HGB 11.4 (L) 03/06/2023 1025   HGB 13.6 10/31/2013 1541   HCT 36.1 03/06/2023 1025   HCT 41.4 10/31/2013 1541   PLT 258 03/06/2023 1025   PLT 272 10/31/2013 1541   MCV 87.8 03/06/2023 1025   MCV 91 10/31/2013 1541   MCH 27.7 03/06/2023 1025   MCHC 31.6 03/06/2023 1025   RDW 15.9 (H) 03/06/2023 1025   RDW 13.5 10/31/2013 1541   LYMPHSABS 0.7 03/06/2023 1025   LYMPHSABS 2.0 10/31/2013 1541   MONOABS 0.3 03/06/2023 1025   MONOABS 0.3 10/31/2013 1541    EOSABS 0.0 03/06/2023 1025   EOSABS 0.2 10/31/2013 1541   BASOSABS 0.0 03/06/2023 1025   BASOSABS 0.1 10/31/2013 1541    BMET    Component Value Date/Time   NA 135 03/06/2023 1025   NA 138 10/31/2013 1541   K 4.3 03/06/2023 1025   K 4.5 10/31/2013 1541   CL 102 03/06/2023 1025   CL 101 10/31/2013 1541   CO2 26 03/06/2023 1025   CO2 29 10/31/2013 1541   GLUCOSE 157 (H) 03/06/2023 1025   GLUCOSE 96 10/31/2013 1541   BUN 29 (H) 03/07/2023 1302   BUN 10 10/31/2013 1541   CREATININE 0.83 03/07/2023 1302   CREATININE 0.91 10/31/2013 1541   CALCIUM 8.2 (L) 03/06/2023 1025   CALCIUM 9.1 10/31/2013 1541   GFRNONAA >60 03/07/2023 1302   GFRNONAA >60 10/31/2013 1541   GFRAA >60 04/13/2018 1030   GFRAA >60 10/31/2013 1541    INR    Component Value Date/Time   INR 1.2 03/04/2023 0521     Intake/Output Summary (Last 24 hours) at 03/08/2023 1243 Last data filed at 03/07/2023 1759 Gross per 24 hour  Intake 164.6 ml  Output --  Net 164.6 ml     Assessment/Plan:  72 y.o. female is s/p Aortogram with selective left lower extremity angiogram with angioplasty and stent placement.  1 Day Post-Op   PLAN: PT/OT Pain Medications PRN Advance  diet as tolerated. Surgery with podiatry on Friday.  DVT prophylaxis:  Lovenox 40 mg Sq Q24hrs   Marcie Bal [facility] 03/08/2023 12:43 PM

## 2023-03-09 ENCOUNTER — Other Ambulatory Visit: Payer: Self-pay | Admitting: Podiatry

## 2023-03-09 ENCOUNTER — Other Ambulatory Visit: Payer: Self-pay | Admitting: *Deleted

## 2023-03-09 DIAGNOSIS — A419 Sepsis, unspecified organism: Secondary | ICD-10-CM

## 2023-03-09 LAB — PROCALCITONIN: Procalcitonin: 1.38 ng/mL

## 2023-03-09 NOTE — Progress Notes (Signed)
PROGRESS NOTE    Wendy Arias  ZOX:[id number] DOB: 1951/01/06 DOA: 03/03/2023 PCP: Wilford Corner, PA-C    Brief Narrative:    72 y.o. female with medical history significant of ischemic cardiomyopathy with preserved ejection fraction, COPD, depression, hepatic cirrhosis, hyperlipidemia, essential hypertension, iron deficiency anemia, depression with anxiety, osteoporosis, left ankle fracture recently who was brought to the ER with altered mental status.  Patient is on home O2 at 2 L.  In the ER however she was found to be confused.  She was scheduled to have an angiogram with vascular surgery this morning.  The daughter found her to be pale and hypoxic.  Patient also has recent left ankle fracture trimalleolar nature and was supposed to have surgery tomorrow.  While in the ER she was found to be hypoxic.  Patient also meets sepsis criteria with heart rate leukocytosis and fever.  She she turned out to have COVID-19 positive test results, also CTA showing pneumonia and urinalysis consistent with UTI.  Patient being admitted with sepsis due to pneumonia and UTI and most likely secondary to COVID-19 infection.  Patient was hypotensive initially requiring Levophed.  She has been titrated off Levophed at the moment and maintaining systolic blood pressure over 100 and MAP of more than 65.  ICU was initially consulted but now declined admission and return back to hospitalist admit.   9/6: Patient weaned off vasopressors.  Mentating more clearly.  Able to answer my questions appropriately.  Still feeling tachypneic, short of breath.  On 7 L.  9/7: Blood pressure stable.  Weaned to 1 L.  Much less tachypneic and short of breath.  9/8: Continues to clinically improved.  Remains on 1 L oxygen. 9/9: Continues to clinically improve.  Oxygen status improved.  No complaints of pain in left ankle.  Communicated with podiatry and vascular surgery.  Tentative plan for lower extremity angiography 9/9 9/10:  Status post lower extremity angiography 9/9.  Status post PTA of posterior left tibial artery and stent placement to left SFA   Assessment & Plan:   Principal Problem:   Severe sepsis (HCC) Active Problems:   COPD exacerbation (HCC)   Acute metabolic encephalopathy   Essential hypertension   HLD (hyperlipidemia)   CAD (coronary artery disease)   Depression with anxiety   Ischemic cardiomyopathy   Atherosclerosis of artery of extremity with ulceration (HCC)   Closed left ankle fracture   COVID-19 virus infection  Severe sepsis Septic shock Community acquired pneumonia COVID-19 infection Urinary tract infection Multiple sources of infection.  My suspicion is patient developed a COVID-19 infection and concomitant bacterial pneumonia.  Unclear whether the urinalysis results are the primary driver for shock physiology.  However shock physiology has essentially resolved at this time.  Patient mentating much more clearly.  Lactic acid only 1.  Indicates good perfusion. Plan: No IVF Completed course of azithromycin and Rocephin Stop Lasix Continue prednisone 40 mg p.o. daily x 5 total days No indication for remdesivir Monitor vitals and fever curve  Acute on chronic hypoxic respiratory failure Likely multifactorial related to underlying COPD and COVID-19 infection and community-acquired pneumonia Was on 7 L high flow on admission.   Weaned to room air Patient apparently has a 2 L requirement but is saturating adequately on room air Plan: Oxygen if necessary Steroids as above  COPD with acute exacerbation Patient wheezing on examination with increase in oxygen demand Suspect mild decompensation of COPD in the setting of acute infection Lung sounds much improved  Plan: Prednisone 40 mg daily x 5 days Scheduled and as needed bronchodilator regimen Wean oxygen as tolerated Lung sounds improving  Left trimalleolar fracture Discussed case with Dr. Ether Griffins.  For now can justify  continued inpatient management due to patient's pain control needs.  If patient were to remain hospitalized Dr. Ether Griffins would be able to perform ORIF in OR on Friday 9/13.  Will need to hold Plavix on Thursday and then again on Friday with plans to restart on Saturday. Plavix on hold. PT currently advising HH, will need re-eval after surgery  Acute metabolic encephalopathy resolved  Peripheral vascular disease Status post left lower extremity angiogram with angioplasty and stent placement on 9/9.     In order to facilitate surgical planning will need to hold Plavix on Thursday and then again on Friday. held  Coronary artery disease Continue antiplatelet therapy as above  Essential hypertension BP controlled  Hyperlipidemia Statin  DVT prophylaxis: SQ lovenox Code Status: DNR Family Communication: sister updated @ bedside 9/11 Disposition Plan: Status is: Inpatient Remains inpatient appropriate because: Intractable pain from left lower extremity requiring IV pain medication   Level of care: Progressive  Consultants:  None  Procedures:  None  Antimicrobials:   Subjective: Seen and examined.  Reports ongoing severe right ankle pain  Objective: Vitals:   03/08/23 2030 03/09/23 0029 03/09/23 0430 03/09/23 0822  BP: (!) 118/90 110/69 122/80 139/81  Pulse: 79 79 69 80  Resp: 18 19 20    Temp: 98.4 F (36.9 C) 98.1 F (36.7 C) 98 F (36.7 C) 97.7 F (36.5 C)  TempSrc:    Oral  SpO2: 95% 94% 92% 98%  Weight:      Height:        Intake/Output Summary (Last 24 hours) at 03/09/2023 1221 Last data filed at 03/08/2023 2036 Gross per 24 hour  Intake 420 ml  Output 1950 ml  Net -1530 ml   Filed Weights   03/03/23 0913  Weight: 68 kg    Examination:  General exam: NAD Respiratory system: Lungs clear.  Normal work of breathing.  Room air Cardiovascular system: S1-S2, RRR, no murmurs, no pedal edema Gastrointestinal system: Soft, NT/ND, normal bowel sounds Central  nervous system: Alert and oriented. No focal neurological deficits. Extremities: Left ankle mild swelling and some bruising Skin: warm Psychiatry: Judgement and insight appear normal. Mood & affect appropriate.     Data Reviewed: I have personally reviewed following labs and imaging studies  CBC: Recent Labs  Lab 03/03/23 0325 03/03/23 0932 03/04/23 0521 03/05/23 1005 03/06/23 1025  WBC 15.5* 8.4 13.8* 12.8* 8.6  NEUTROABS  --  7.5  --  11.1* 7.5  HGB 10.4* 12.3 9.6* 11.4* 11.4*  HCT 32.5* 40.1 29.8* 35.8* 36.1  MCV 86.9 89.9 89.0 88.2 87.8  PLT 249 276 215 257 258   Basic Metabolic Panel: Recent Labs  Lab 03/03/23 0932 03/04/23 0521 03/05/23 1005 03/06/23 1025 03/07/23 1302  NA 140 138 137 135  --   K 4.2 4.5 4.2 4.3  --   CL 109 107 100 102  --   CO2 22 25 26 26   --   GLUCOSE 85 111* 136* 157*  --   BUN 21 16 25* 24* 29*  CREATININE 1.11* 0.76 0.84 0.76 0.83  CALCIUM 8.5* 8.1* 8.7* 8.2*  --    GFR: Estimated Creatinine Clearance: 54.6 mL/min (by C-G formula based on SCr of 0.83 mg/dL). Liver Function Tests: Recent Labs  Lab 03/03/23 0932 03/04/23 9604  AST 19 18  ALT 15 13  ALKPHOS 107 76  BILITOT 0.6 0.3  PROT 6.7 5.8*  ALBUMIN 3.3* 2.8*   No results for input(s): "LIPASE", "AMYLASE" in the last 168 hours. No results for input(s): "AMMONIA" in the last 168 hours. Coagulation Profile: Recent Labs  Lab 03/04/23 0521  INR 1.2   Cardiac Enzymes: No results for input(s): "CKTOTAL", "CKMB", "CKMBINDEX", "TROPONINI" in the last 168 hours. BNP (last 3 results) No results for input(s): "PROBNP" in the last 8760 hours. HbA1C: No results for input(s): "HGBA1C" in the last 72 hours. CBG: No results for input(s): "GLUCAP" in the last 168 hours. Lipid Profile: No results for input(s): "CHOL", "HDL", "LDLCALC", "TRIG", "CHOLHDL", "LDLDIRECT" in the last 72 hours. Thyroid Function Tests: No results for input(s): "TSH", "T4TOTAL", "FREET4", "T3FREE",  "THYROIDAB" in the last 72 hours. Anemia Panel: No results for input(s): "VITAMINB12", "FOLATE", "FERRITIN", "TIBC", "IRON", "RETICCTPCT" in the last 72 hours. Sepsis Labs: Recent Labs  Lab 03/03/23 0932 03/03/23 1409 03/04/23 0521 03/07/23 0341 03/08/23 0434 03/09/23 0441  PROCALCITON  --   --  31.97 6.13 2.51 1.38  LATICACIDVEN 1.8 1.1  --   --   --   --     Recent Results (from the past 240 hour(s))  Resp panel by RT-PCR (RSV, Flu A&B, Covid) Anterior Nasal Swab     Status: Abnormal   Collection Time: 03/03/23  9:32 AM   Specimen: Anterior Nasal Swab  Result Value Ref Range Status   SARS Coronavirus 2 by RT PCR POSITIVE (A) NEGATIVE Final    Comment: (NOTE) SARS-CoV-2 target nucleic acids are DETECTED.  The SARS-CoV-2 RNA is generally detectable in upper respiratory specimens during the acute phase of infection. Positive results are indicative of the presence of the identified virus, but do not rule out bacterial infection or co-infection with other pathogens not detected by the test. Clinical correlation with patient history and other diagnostic information is necessary to determine patient infection status. The expected result is Negative.  Fact Sheet for Patients: BloggerCourse.com  Fact Sheet for Healthcare Providers: SeriousBroker.it  This test is not yet approved or cleared by the [place] FDA and  has been authorized for detection and/or diagnosis of SARS-CoV-2 by FDA under an Emergency Use Authorization (EUA).  This EUA will remain in effect (meaning this test can be used) for the duration of  the COVID-19 declaration under Section 564(b)(1) of the A ct, 21 U.S.C. section 360bbb-3(b)(1), unless the authorization is terminated or revoked sooner.     Influenza A by PCR NEGATIVE NEGATIVE Final   Influenza B by PCR NEGATIVE NEGATIVE Final    Comment: (NOTE) The Xpert Xpress SARS-CoV-2/FLU/RSV plus assay is  intended as an aid in the diagnosis of influenza from Nasopharyngeal swab specimens and should not be used as a sole basis for treatment. Nasal washings and aspirates are unacceptable for Xpert Xpress SARS-CoV-2/FLU/RSV testing.  Fact Sheet for Patients: BloggerCourse.com  Fact Sheet for Healthcare Providers: SeriousBroker.it  This test is not yet approved or cleared by the [place] FDA and has been authorized for detection and/or diagnosis of SARS-CoV-2 by FDA under an Emergency Use Authorization (EUA). This EUA will remain in effect (meaning this test can be used) for the duration of the COVID-19 declaration under Section 564(b)(1) of the Act, 21 U.S.C. section 360bbb-3(b)(1), unless the authorization is terminated or revoked.     Resp Syncytial Virus by PCR NEGATIVE NEGATIVE Final    Comment: (NOTE) Fact Sheet for  Patients: BloggerCourse.com  Fact Sheet for Healthcare Providers: SeriousBroker.it  This test is not yet approved or cleared by the [place] FDA and has been authorized for detection and/or diagnosis of SARS-CoV-2 by FDA under an Emergency Use Authorization (EUA). This EUA will remain in effect (meaning this test can be used) for the duration of the COVID-19 declaration under Section 564(b)(1) of the Act, 21 U.S.C. section 360bbb-3(b)(1), unless the authorization is terminated or revoked.  Performed at [facility], [address] [place], [place] [address]   Blood Culture (routine x 2)     Status: None   Collection Time: 03/03/23  9:32 AM   Specimen: BLOOD  Result Value Ref Range Status   Specimen Description BLOOD RIGHT ANTECUBITAL  Final   Special Requests   Final    BOTTLES DRAWN AEROBIC AND ANAEROBIC Blood Culture adequate volume   Culture   Final    NO GROWTH 5 DAYS Performed at [facility], [address]  [place], [place] [address]    Report Status 03/08/2023 FINAL  Final  Blood Culture (routine x 2)     Status: None   Collection Time: 03/03/23  9:32 AM   Specimen: BLOOD  Result Value Ref Range Status   Specimen Description BLOOD RIGHT ANTECUBITAL  Final   Special Requests   Final    BOTTLES DRAWN AEROBIC AND ANAEROBIC Blood Culture results may not be optimal due to an excessive volume of blood received in culture bottles   Culture   Final    NO GROWTH 5 DAYS Performed at [facility], [address] [place], [place] [address]    Report Status 03/08/2023 FINAL  Final         Radiology Studies: PERIPHERAL VASCULAR CATHETERIZATION  Result Date: 03/07/2023 See surgical note for result.       Scheduled Meds:  amitriptyline  100 mg Oral QHS   aspirin EC  81 mg Oral Daily   atorvastatin  40 mg Oral Daily   buprenorphine  1 patch Transdermal Weekly   [START ON 03/12/2023] clopidogrel  75 mg Oral Daily   cyanocobalamin  1,000 mcg Oral Daily   enoxaparin (LOVENOX) injection  40 mg Subcutaneous Q24H   fluticasone furoate-vilanterol  1 puff Inhalation Daily   And   umeclidinium bromide  1 puff Inhalation Daily   gabapentin  600 mg Oral BID   influenza vaccine adjuvanted  0.5 mL Intramuscular Tomorrow-1000   magnesium oxide  400 mg Oral Daily   melatonin  5 mg Oral QHS   polyethylene glycol  17 g Oral Daily   predniSONE  40 mg Oral Q breakfast   pregabalin  150 mg Oral TID   Vitamin E  400 Units Oral Daily   Continuous Infusions:     LOS: 6 days     Silvano Bilis, MD [facility]   If 7PM-7AM, please contact night-coverage  03/09/2023, 12:21 PM

## 2023-03-09 NOTE — Progress Notes (Signed)
Occupational Therapy Treatment Patient Details Name: Wendy Arias MRN: [id number] DOB: 1951-04-07 Today's Date: 03/09/2023   History of present illness Pt is a 72 y/o F admitted on 03/03/23 after presenting with c/o AMS. Pt found to be positive for COVID-19, CTA showing PNA & urinalysis consistent with UTI. Of note, pt with recent L ankle fx & podiatry holding sx for now; pt also with plans for LLE angiogram but this is on hold for now as well. PMH: ischemic cardiomyopathy, COPD, depression, hepatic cirrhosis, HLD, essential HTN, iron deficiency anemia, depression, anxiety, osteoporosis, 2L home O2 use   OT comments  Pt received upright in recliner, agreeable to OT session, stating she has been sitting up for a bit and would like to return to bed. OT assists with lateral scoot transfer from recliner - bed with CGA progressing to minA with fatigue. Pt verbalizes precautions, and is able to return demonstrate transfer with good adherence. Pt performs grooming tasks upright in bed. NT enters room. Pt left in bed, alarm activated and needs within reach. Pt progressing towards OT goals and is benefiting from skilled therapeutic intervention to address mobility and strengthening for ADL performance.       If plan is discharge home, recommend the following:  A little help with walking and/or transfers;A lot of help with bathing/dressing/bathroom;Assistance with cooking/housework;Assist for transportation;Help with stairs or ramp for entrance   Equipment Recommendations  Other (comment) (LB dressing aides (sponge, shoe horn, sock aide))       Precautions / Restrictions Precautions Precautions: Fall Required Braces or Orthoses: Other Brace Other Brace: LLE cam boot Restrictions Weight Bearing Restrictions: Yes LLE Weight Bearing: Non weight bearing       Mobility Bed Mobility Overal bed mobility: Needs Assistance Bed Mobility: Rolling, Sidelying to Sit Rolling: Contact guard assist Sidelying to  sit: Contact guard assist   Sit to supine: Contact guard assist   General bed mobility comments: supervision for safety and cues for hand placement    Transfers Overall transfer level: Needs assistance Equipment used: None Transfers: Bed to chair/wheelchair/BSC Sit to Stand: Contact guard assist, Min assist          Lateral/Scoot Transfers: Contact guard assist General transfer comment: CGA-minA for trunk control transitioning to bed, vcs for LE precautions     Balance Overall balance assessment: Needs assistance Sitting-balance support: Feet supported, Bilateral upper extremity supported, Feet unsupported Sitting balance-Leahy Scale: Good                                     ADL either performed or assessed with clinical judgement   ADL Overall ADL's : Needs assistance/impaired     Grooming: Sitting;Modified independent Grooming Details (indicate cue type and reason): brushes hair                             Functional mobility during ADLs: Cueing for sequencing;Cueing for safety;Contact guard assist;Supervision/safety (lateral scoot transfer from recliner to bed)        Cognition Arousal: Alert Behavior During Therapy: [facility] for tasks assessed/performed Overall Cognitive Status: Within Functional Limits for tasks assessed  General Comments O2 remained >90% throughout session on RA    Pertinent Vitals/ Pain       Pain Assessment Pain Assessment: 0-10 Pain Score: 7  Pain Location: L ankle Pain Descriptors / Indicators: Discomfort, Guarding, Throbbing, Constant Pain Intervention(s): Limited activity within patient's tolerance, Other (comment) (pt does state pain has improved overall, 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)   Frequency  Min 1X/week        Progress Toward Goals  OT Goals(current goals can now be found in the care plan section)  Progress towards OT  goals: Progressing toward goals  Acute Rehab OT Goals OT Goal Formulation: With patient/family Time For Goal Achievement: 03/20/23 Potential to Achieve Goals: Good  Plan      Co-evaluation                 AM-PAC OT "6 Clicks" Daily Activity     Outcome Measure   Help from another person eating meals?: None Help from another person taking care of personal grooming?: None Help from another person toileting, which includes using toliet, bedpan, or urinal?: A Little Help from another person bathing (including washing, rinsing, drying)?: A Lot Help from another person to put on and taking off regular upper body clothing?: None Help from another person to put on and taking off regular lower body clothing?: A Lot 6 Click Score: 19    End of Session    OT Visit Diagnosis: Other abnormalities of gait and mobility (R26.89);Muscle weakness (generalized) (M62.81);Pain Pain - Right/Left: Left Pain - part of body: Ankle and joints of foot   Activity Tolerance Patient tolerated treatment well   Patient Left in bed;with call bell/phone within reach;with bed alarm set;with nursing/sitter in room   Nurse Communication Mobility status        Time: 1346-1400 OT Time Calculation (min): 14 min  Charges: OT General Charges $OT Visit: 1 Visit OT Treatments $Self Care/Home Management : 8-22 mins  Marque Bango L. Talisa Petrak, OTR/L  03/09/23, 2:09 PM

## 2023-03-09 NOTE — Progress Notes (Addendum)
Physical Therapy Treatment Patient Details Name: Wendy Arias MRN: [id number] DOB: [birth year] Today's Date: 03/09/2023   History of Present Illness Pt is a 72 y/o F admitted on 03/03/23 after presenting with c/o AMS. Pt found to be positive for COVID-19, CTA showing PNA & urinalysis consistent with UTI. Of note, pt with recent L ankle fx & podiatry holding sx for now; pt also with plans for LLE angiogram but this is on hold for now as well. PMH: ischemic cardiomyopathy, COPD, depression, hepatic cirrhosis, HLD, essential HTN, iron deficiency anemia, depression, anxiety, osteoporosis, 2L home O2 use    PT Comments  Pt presents laying in bed with sister in room, 7/10 pain in L ankle. She performed supine<>sit with supervision, PT noting improved efficiency with movement compared to previous session. She also tolerated lateral scoot transfer to recliner with CGA for trunk control/ maintaining LE precautions/ VC for hand/foot placement. Pt exhibited improved pain control and tolerance to activity today. She would benefit from continued skilled care to maximize functional abilities.    If plan is discharge home, recommend the following: A lot of help with walking and/or transfers;A lot of help with bathing/dressing/bathroom;Assistance with cooking/housework;Assist for transportation;Help with stairs or ramp for entrance;Direct supervision/assist for medications management   Can travel by private vehicle        Equipment Recommendations  None recommended by PT    Recommendations for Other Services       Precautions / Restrictions Precautions Precautions: Fall Required Braces or Orthoses: Other Brace Other Brace: LLE cam boot Restrictions Weight Bearing Restrictions: Yes LLE Weight Bearing: Non weight bearing     Mobility  Bed Mobility Overal bed mobility: Needs Assistance Bed Mobility: Supine to Sit     Supine to sit: Supervision, Used rails, HOB elevated     General bed mobility  comments: supervision for safety and due to pain levels, improved efficiency compared to previous session    Transfers Overall transfer level: Needs assistance   Transfers: Bed to chair/wheelchair/BSC            Lateral/Scoot Transfers: Contact guard assist General transfer comment: CGA for trunk control/ maintaining LE prec/ VC for hand/foot placement, no physical assist needed    Ambulation/Gait               General Gait Details: unable at this time   Stairs             Wheelchair Mobility     Tilt Bed    Modified Rankin (Stroke Patients Only)       Balance Overall balance assessment: Needs assistance Sitting-balance support: Feet supported, Bilateral upper extremity supported, Feet unsupported Sitting balance-Leahy Scale: Good Sitting balance - Comments: improved upright positioning/ control with decreased support from extremities                                    Cognition Arousal: Alert Behavior During Therapy: WFL for tasks assessed/performed Overall Cognitive Status: Within Functional Limits for tasks assessed                                          Exercises General Exercises - Lower Extremity Gluteal Sets: Strengthening, Both, 10 reps, Seated Long Arc Quad: Strengthening, Both, Seated, 15 reps Hip Flexion/Marching: Strengthening, Both, 10 reps, Seated  General Comments General comments (skin integrity, edema, etc.): O2 remained >90% throughout session on RA      Pertinent Vitals/Pain Pain Assessment Pain Assessment: 0-10 Pain Score: 7  Pain Location: L ankle Pain Descriptors / Indicators: Discomfort, Guarding, Throbbing, Constant Pain Intervention(s): Limited activity within patient's tolerance, Monitored during session    Home Living                          Prior Function            PT Goals (current goals can now be found in the care plan section) Acute Rehab PT  Goals Patient Stated Goal: get better, go home Progress towards PT goals: Progressing toward goals    Frequency    Min 1X/week      PT Plan      Co-evaluation              AM-PAC PT "6 Clicks" Mobility   Outcome Measure  Help needed turning from your back to your side while in a flat bed without using bedrails?: None Help needed moving from lying on your back to sitting on the side of a flat bed without using bedrails?: None Help needed moving to and from a bed to a chair (including a wheelchair)?: A Little Help needed standing up from a chair using your arms (e.g., wheelchair or bedside chair)?: A Lot Help needed to walk in hospital room?: A Lot Help needed climbing 3-5 steps with a railing? : Total 6 Click Score: 16    End of Session Equipment Utilized During Treatment: Gait belt Activity Tolerance: Patient tolerated treatment well Patient left: with call bell/phone within reach;in chair;with chair alarm set;with family/visitor present   PT Visit Diagnosis: Muscle weakness (generalized) (M62.81);Pain;Other abnormalities of gait and mobility (R26.89);Difficulty in walking, not elsewhere classified (R26.2) Pain - Right/Left: Left Pain - part of body: Ankle and joints of foot     Time: 5284-1324 PT Time Calculation (min) (ACUTE ONLY): 25 min  Charges:    $Therapeutic Exercise: 8-22 mins $Therapeutic Activity: 8-22 mins PT General Charges $$ ACUTE PT VISIT: 1 Visit                    Claire Dolores, PT, SPT 12:05 PM,03/09/23

## 2023-03-09 NOTE — Consult Note (Signed)
[facility] Customer service manager [facility]) Accountable Care Organization (ACO) [facility] Liaison Note  03/09/2023  Wendy Arias 17-Sep-1950 [id number]  Location: [facility] RN Hospital Liaison screened the patient remotely at [facility].  Insurance: [employer]   [name] is a 72 y.o. female who is a Primary Care Patient of Harlon Flor, Jonnie Finner, PA-C [facility]. The patient was screened for  30 day readmission hospitalization with noted high risk score for unplanned readmission risk with 2 IP in 6 months.  The patient was assessed for potential [facility]) Care Management service needs for post hospital transition for care coordination. Review of patient's electronic medical record reveals patient was admitted with Severe Sepsis. Liaison spoke with pt concerning community care management services. Pt receptive to a post hospital prevention readmission follow up call.    Plan: [facility] Liaison will continue to follow progress and disposition to asess for post hospital community care coordination/management needs.  Referral request for community care coordination: Will make a referral to a nurse care coordinator.   [facility] Care Management/Population Health does not replace or interfere with any arrangements made by the Inpatient Transition of Care team.   For questions contact:   Elliot Cousin, RN, [facility] Liaison [place]   Population Health Office Hours MTWF  8:00 am-6:00 pm [phone] mobile [phone] [Office toll free line] Office Hours are M-F 8:30 - 5 pm [email or web address]

## 2023-03-09 NOTE — TOC Progression Note (Signed)
Transition of Care [facility]) - Progression Note    Patient Details  Name: Wendy Arias MRN: [id number] Date of Birth: [birth year]  Transition of Care [facility]) CM/SW Contact  Truddie Hidden, RN Phone Number: 03/09/2023, 4:26 PM  Clinical Narrative:    Patient accepted for [facility] via [facility] per Coralee North.    Expected Discharge Plan: Home w Home Health Services Barriers to Discharge: No Barriers Identified  Expected Discharge Plan and Services       Living arrangements for the past 2 months: Single Family Home                   DME Agency: [employer]                   Social Determinants of Health (SDOH) Interventions SDOH Screenings   Food Insecurity: No Food Insecurity (03/04/2023)  Housing: Low Risk  (03/04/2023)  Transportation Needs: No Transportation Needs (03/04/2023)  Utilities: Not At Risk (03/04/2023)  Depression (PHQ2-9): Low Risk  (01/27/2023)  Financial Resource Strain: High Risk (02/16/2018)  Physical Activity: Inactive (02/16/2018)  Social Connections: Unknown (02/16/2018)  Stress: Stress Concern Present (02/16/2018)  Tobacco Use: Medium Risk (03/04/2023)    Readmission Risk Interventions    07/14/2022   11:37 AM  Readmission Risk Prevention Plan  Transportation Screening Complete  PCP or Specialist Appt within 5-7 Days Complete  Home Care Screening Complete  Medication Review (RN CM) Complete

## 2023-03-10 LAB — SURGICAL PCR SCREEN
MRSA, PCR: NEGATIVE
Staphylococcus aureus: NEGATIVE

## 2023-03-10 LAB — CREATININE, SERUM
Creatinine, Ser: 0.69 mg/dL (ref 0.44–1.00)
GFR, Estimated: >60 mL/min (ref >60)

## 2023-03-10 MED ORDER — MUPIROCIN 2 % EX OINT
1.0000 | TOPICAL_OINTMENT | Freq: Two times a day (BID) | CUTANEOUS | Status: DC
  Filled 2023-03-10: qty 22

## 2023-03-10 MED ORDER — CEFAZOLIN SODIUM-DEXTROSE 2-4 GM/100ML-% IV SOLN
2.0000 g | INTRAVENOUS | Status: AC
  Administered 2023-03-11: 2 g via INTRAVENOUS
  Filled 2023-03-10: qty 100

## 2023-03-10 MED ORDER — CEFAZOLIN SODIUM-DEXTROSE 2-4 GM/100ML-% IV SOLN: 2.0000 g | INTRAVENOUS | Status: DC

## 2023-03-10 MED ORDER — MORPHINE SULFATE (PF) 4 MG/ML IV SOLN
3.0000 mg | INTRAVENOUS | Status: DC | PRN
  Administered 2023-03-10 – 2023-03-12 (×6): 3 mg via INTRAVENOUS
  Filled 2023-03-10 (×7): qty 1

## 2023-03-10 MED ORDER — AMITRIPTYLINE HCL 50 MG PO TABS
100.0000 mg | ORAL_TABLET | Freq: Every day | ORAL | Status: DC
  Administered 2023-03-10 – 2023-03-11 (×2): 100 mg via ORAL
  Filled 2023-03-10: qty 2
  Filled 2023-03-10 (×3): qty 1

## 2023-03-10 MED ORDER — HYDROXYZINE HCL 25 MG PO TABS
50.0000 mg | ORAL_TABLET | Freq: Three times a day (TID) | ORAL | Status: DC | PRN
  Administered 2023-03-10 – 2023-03-11 (×2): 50 mg via ORAL
  Filled 2023-03-10 (×2): qty 2

## 2023-03-10 MED ORDER — CHLORHEXIDINE GLUCONATE 4 % EX SOLN
60.0000 mL | Freq: Once | CUTANEOUS | Status: AC
  Administered 2023-03-11: 4 via TOPICAL

## 2023-03-10 MED ORDER — POVIDONE-IODINE 10 % EX SWAB: 2.0000 | Freq: Once | CUTANEOUS | Status: DC

## 2023-03-10 MED ORDER — AMITRIPTYLINE HCL 50 MG PO TABS: 50.0000 mg | ORAL_TABLET | Freq: Every day | ORAL | Status: DC

## 2023-03-10 NOTE — Plan of Care (Signed)
  Problem: Education: Goal: Knowledge of General Education information will improve Description Including pain rating scale, medication(s)/side effects and non-pharmacologic comfort measures Outcome: Progressing   

## 2023-03-10 NOTE — Plan of Care (Signed)
  Problem: Education: Goal: Knowledge of General Education information will improve Description: Including pain rating scale, medication(s)/side effects and non-pharmacologic comfort measures 03/10/2023 0705 by Loraine Maple, RN Outcome: Progressing 03/10/2023 0705 by Loraine Maple, RN Outcome: Progressing

## 2023-03-10 NOTE — Progress Notes (Signed)
Occupational Therapy Treatment Patient Details Name: Wendy Arias MRN: [id number] DOB: [birth year] Today's Date: 03/10/2023   History of present illness Pt is a 72 y/o F admitted on 03/03/23 after presenting with c/o AMS. Pt found to be positive for COVID-19, CTA showing PNA & urinalysis consistent with UTI. Of note, pt with recent L ankle fx & podiatry holding sx for now; pt also with plans for LLE angiogram but this is on hold for now as well. PMH: ischemic cardiomyopathy, COPD, depression, hepatic cirrhosis, HLD, essential HTN, iron deficiency anemia, depression, anxiety, osteoporosis, 2L home O2 use   OT comments  Pt seen for OT treatment on this date. Pt recieved sitting EOB, incontinent of urine (possibly purewick failure). OT provides minA for lateral scoot transfer EOB > recliner maintaining LLE NWB. Pt given items and was able to perform UB/LB bathing from a seated postion using lateral leans. Pain overall has improved, pt is nervous for procedure tm. Is able to transfer with good overall adherence to NWB, does prefer lateral scoots to get from EOB <> recliner. Pt making good progress toward goals, will continue to follow POC. Discharge recommendation remains appropriate.        If plan is discharge home, recommend the following:  A little help with walking and/or transfers;A lot of help with bathing/dressing/bathroom;Assistance with cooking/housework;Assist for transportation;Help with stairs or ramp for entrance   Equipment Recommendations  Other (comment)       Precautions / Restrictions Precautions Precautions: Fall Restrictions Weight Bearing Restrictions: Yes LLE Weight Bearing: Non weight bearing       Mobility Bed Mobility Overal bed mobility: Needs Assistance             General bed mobility comments: NT this date    Transfers Overall transfer level: Needs assistance Equipment used: None Transfers: Bed to chair/wheelchair/BSC            Lateral/Scoot  Transfers: Min assist General transfer comment: Pt prefers lateral scoot transfers     Balance Overall balance assessment: Needs assistance Sitting-balance support: Feet supported, No upper extremity supported                                       ADL either performed or assessed with clinical judgement   ADL Overall ADL's : Needs assistance/impaired         Upper Body Bathing: Set up;Sitting Upper Body Bathing Details (indicate cue type and reason): OT provides washcloth and pt bathes herself seated in recliner Lower Body Bathing: Sitting/lateral leans;Set up   Upper Body Dressing : Set up;Sitting Upper Body Dressing Details (indicate cue type and reason): dons gown, with assist from OT to tie back                   General ADL Comments: Pt recieved sitting EOB, incontinent of urine (possibly purewick failure). OT provides minA for lateral scoot transfer EOB > recliner maintaining LLE NWB. Pt given items and was able to perform UB/LB bathing from a seated postion using lateral leans.      Cognition Arousal: Alert Behavior During Therapy: WFL for tasks assessed/performed Overall Cognitive Status: Within Functional Limits for tasks assessed  Pertinent Vitals/ Pain       Pain Assessment Pain Assessment: Faces Pain Score: 4  Faces Pain Scale: Hurts little more Pain Location: L ankle Pain Descriptors / Indicators: Discomfort, Guarding, Throbbing, Constant Pain Intervention(s): Limited activity within patient's tolerance   Frequency  Min 1X/week        Progress Toward Goals  OT Goals(current goals can now be found in the care plan section)  Progress towards OT goals: Progressing toward goals  Acute Rehab OT Goals OT Goal Formulation: With patient/family Time For Goal Achievement: 03/20/23 Potential to Achieve Goals: Good  Plan         AM-PAC OT "6 Clicks" Daily  Activity     Outcome Measure   Help from another person eating meals?: None Help from another person taking care of personal grooming?: None Help from another person toileting, which includes using toliet, bedpan, or urinal?: A Lot Help from another person bathing (including washing, rinsing, drying)?: A Lot Help from another person to put on and taking off regular upper body clothing?: A Lot Help from another person to put on and taking off regular lower body clothing?: A Lot 6 Click Score: 16    End of Session    OT Visit Diagnosis: Other abnormalities of gait and mobility (R26.89);Muscle weakness (generalized) (M62.81);Pain Pain - Right/Left: Left Pain - part of body: Ankle and joints of foot   Activity Tolerance Patient tolerated treatment well   Patient Left in chair;with call bell/phone within reach;with chair alarm set   Nurse Communication Other (comment) (Alerted NT of pt's request of new purewick)        Time: 6433-2951 OT Time Calculation (min): 23 min  Charges: OT General Charges $OT Visit: 1 Visit OT Treatments $Self Care/Home Management : 23-37 mins  Kalimah Capurro L. Oval Moralez, OTR/L  03/10/23, 4:03 PM

## 2023-03-10 NOTE — Progress Notes (Signed)
PROGRESS NOTE    Wendy Arias  ZHY:[id number] DOB: [birth year] DOA: 03/03/2023 PCP: Wilford Corner, PA-C    Brief Narrative:    72 y.o. female with medical history significant of ischemic cardiomyopathy with preserved ejection fraction, COPD, depression, hepatic cirrhosis, hyperlipidemia, essential hypertension, iron deficiency anemia, depression with anxiety, osteoporosis, left ankle fracture recently who was brought to the ER with altered mental status.  Patient is on home O2 at 2 L.  In the ER however she was found to be confused.  She was scheduled to have an angiogram with vascular surgery this morning.  The daughter found her to be pale and hypoxic.  Patient also has recent left ankle fracture trimalleolar nature and was supposed to have surgery tomorrow.  While in the ER she was found to be hypoxic.  Patient also meets sepsis criteria with heart rate leukocytosis and fever.  She she turned out to have COVID-19 positive test results, also CTA showing pneumonia and urinalysis consistent with UTI.  Patient being admitted with sepsis due to pneumonia and UTI and most likely secondary to COVID-19 infection.  Patient was hypotensive initially requiring Levophed.  She has been titrated off Levophed at the moment and maintaining systolic blood pressure over 100 and MAP of more than 65.  ICU was initially consulted but now declined admission and return back to hospitalist admit.   9/6: Patient weaned off vasopressors.  Mentating more clearly.  Able to answer my questions appropriately.  Still feeling tachypneic, short of breath.  On 7 L.  9/7: Blood pressure stable.  Weaned to 1 L.  Much less tachypneic and short of breath.  9/8: Continues to clinically improved.  Remains on 1 L oxygen. 9/9: Continues to clinically improve.  Oxygen status improved.  No complaints of pain in left ankle.  Communicated with podiatry and vascular surgery.  Tentative plan for lower extremity angiography 9/9 9/10:  Status post lower extremity angiography 9/9.  Status post PTA of posterior left tibial artery and stent placement to left SFA   Assessment & Plan:   Principal Problem:   Severe sepsis (HCC) Active Problems:   COPD exacerbation (HCC)   Acute metabolic encephalopathy   Essential hypertension   HLD (hyperlipidemia)   CAD (coronary artery disease)   Depression with anxiety   Ischemic cardiomyopathy   Atherosclerosis of artery of extremity with ulceration (HCC)   Closed left ankle fracture   COVID-19 virus infection  Severe sepsis Septic shock Community acquired pneumonia COVID-19 infection Urinary tract infection Multiple sources of infection.  Our  suspicion is patient developed a COVID-19 infection and concomitant bacterial pneumonia.  Unclear whether the urinalysis results are the primary driver for shock physiology.  However shock physiology has essentially resolved at this time.  Patient mentating much more clearly.  Lactic acid only 1.  Indicates good perfusion. Plan: No IVF Completed course of azithromycin and Rocephin Continue prednisone 40 mg p.o. daily x 5 total days Monitor vitals and fever curve  Acute on chronic hypoxic respiratory failure Likely multifactorial related to underlying COPD and COVID-19 infection and community-acquired pneumonia Was on 7 L high flow on admission.   Weaned to room air Patient apparently has a 2 L requirement but is saturating adequately on room air Plan: Oxygen if necessary Steroids as above  COPD with acute exacerbation Patient wheezing on examination with increase in oxygen demand Suspect mild decompensation of COPD in the setting of acute infection Lung sounds much improved Plan: Prednisone 40 mg daily  x 5 days Scheduled and as needed bronchodilator regimen Wean oxygen as tolerated Lung sounds improving  Left trimalleolar fracture Discussed case with Dr. Ether Griffins.  For now can justify continued inpatient management due to  patient's pain control needs.  If patient were to remain hospitalized Dr. Ether Griffins would be able to perform ORIF in OR on Friday 9/13.  Will need to hold Plavix on Thursday and then again on Friday with plans to restart on Saturday. Plavix on hold. PT currently advising HH, will need re-eval after surgery  Acute metabolic encephalopathy resolved  Peripheral vascular disease Status post left lower extremity angiogram with angioplasty and stent placement on 9/9.     In order to facilitate surgical planning will need to hold Plavix on Thursday and then again on Friday. Held today  Coronary artery disease Continue antiplatelet therapy as above  Essential hypertension BP controlled  Hyperlipidemia Statin  DVT prophylaxis: SQ lovenox Code Status: DNR Family Communication: sister updated @ bedside 9/12 Disposition Plan: Status is: Inpatient Remains inpatient appropriate because: pain from left lower extremity requiring IV pain medication   Level of care: Progressive  Consultants:  None  Procedures:  None  Antimicrobials:   Subjective: Seen and examined.  Reports ongoing left ankle pain. Having regular BMs  Objective: Vitals:   03/09/23 2044 03/10/23 0006 03/10/23 0357 03/10/23 0826  BP: 103/67 119/78 130/70 90/81  Pulse: 75 70 66 71  Resp: 17 18 17 16   Temp: 97.9 F (36.6 C) 98.1 F (36.7 C) 97.8 F (36.6 C) 98.3 F (36.8 C)  TempSrc:      SpO2: 94% 94% 92% 93%  Weight:      Height:        Intake/Output Summary (Last 24 hours) at 03/10/2023 1127 Last data filed at 03/09/2023 2116 Gross per 24 hour  Intake --  Output 750 ml  Net -750 ml   Filed Weights   03/03/23 0913  Weight: 68 kg    Examination:  General exam: NAD Respiratory system: Lungs clear.  Normal work of breathing.  Room air Cardiovascular system: S1-S2, RRR, no murmurs, no pedal edema Gastrointestinal system: Soft, NT/ND, normal bowel sounds Central nervous system: Alert and oriented. No focal  neurological deficits. Extremities: Left ankle mild swelling and some bruising Skin: warm Psychiatry: Judgement and insight appear normal. Mood & affect appropriate.     Data Reviewed: I have personally reviewed following labs and imaging studies  CBC: Recent Labs  Lab 03/04/23 0521 03/05/23 1005 03/06/23 1025  WBC 13.8* 12.8* 8.6  NEUTROABS  --  11.1* 7.5  HGB 9.6* 11.4* 11.4*  HCT 29.8* 35.8* 36.1  MCV 89.0 88.2 87.8  PLT 215 257 258   Basic Metabolic Panel: Recent Labs  Lab 03/04/23 0521 03/05/23 1005 03/06/23 1025 03/07/23 1302 03/10/23 0458  NA 138 137 135  --   --   K 4.5 4.2 4.3  --   --   CL 107 100 102  --   --   CO2 25 26 26   --   --   GLUCOSE 111* 136* 157*  --   --   BUN 16 25* 24* 29*  --   CREATININE 0.76 0.84 0.76 0.83 0.69  CALCIUM 8.1* 8.7* 8.2*  --   --    GFR: Estimated Creatinine Clearance: 56.7 mL/min (by C-G formula based on SCr of 0.69 mg/dL). Liver Function Tests: Recent Labs  Lab 03/04/23 0521  AST 18  ALT 13  ALKPHOS 76  BILITOT 0.3  PROT 5.8*  ALBUMIN 2.8*   No results for input(s): "LIPASE", "AMYLASE" in the last 168 hours. No results for input(s): "AMMONIA" in the last 168 hours. Coagulation Profile: Recent Labs  Lab 03/04/23 0521  INR 1.2   Cardiac Enzymes: No results for input(s): "CKTOTAL", "CKMB", "CKMBINDEX", "TROPONINI" in the last 168 hours. BNP (last 3 results) No results for input(s): "PROBNP" in the last 8760 hours. HbA1C: No results for input(s): "HGBA1C" in the last 72 hours. CBG: No results for input(s): "GLUCAP" in the last 168 hours. Lipid Profile: No results for input(s): "CHOL", "HDL", "LDLCALC", "TRIG", "CHOLHDL", "LDLDIRECT" in the last 72 hours. Thyroid Function Tests: No results for input(s): "TSH", "T4TOTAL", "FREET4", "T3FREE", "THYROIDAB" in the last 72 hours. Anemia Panel: No results for input(s): "VITAMINB12", "FOLATE", "FERRITIN", "TIBC", "IRON", "RETICCTPCT" in the last 72 hours. Sepsis  Labs: Recent Labs  Lab 03/03/23 1409 03/04/23 0521 03/07/23 0341 03/08/23 0434 03/09/23 0441  PROCALCITON  --  31.97 6.13 2.51 1.38  LATICACIDVEN 1.1  --   --   --   --     Recent Results (from the past 240 hour(s))  Resp panel by RT-PCR (RSV, Flu A&B, Covid) Anterior Nasal Swab     Status: Abnormal   Collection Time: 03/03/23  9:32 AM   Specimen: Anterior Nasal Swab  Result Value Ref Range Status   SARS Coronavirus 2 by RT PCR POSITIVE (A) NEGATIVE Final    Comment: (NOTE) SARS-CoV-2 target nucleic acids are DETECTED.  The SARS-CoV-2 RNA is generally detectable in upper respiratory specimens during the acute phase of infection. Positive results are indicative of the presence of the identified virus, but do not rule out bacterial infection or co-infection with other pathogens not detected by the test. Clinical correlation with patient history and other diagnostic information is necessary to determine patient infection status. The expected result is Negative.  Fact Sheet for Patients: BloggerCourse.com  Fact Sheet for Healthcare Providers: SeriousBroker.it  This test is not yet approved or cleared by the [place] FDA and  has been authorized for detection and/or diagnosis of SARS-CoV-2 by FDA under an Emergency Use Authorization (EUA).  This EUA will remain in effect (meaning this test can be used) for the duration of  the COVID-19 declaration under Section 564(b)(1) of the A ct, 21 U.S.C. section 360bbb-3(b)(1), unless the authorization is terminated or revoked sooner.     Influenza A by PCR NEGATIVE NEGATIVE Final   Influenza B by PCR NEGATIVE NEGATIVE Final    Comment: (NOTE) The Xpert Xpress SARS-CoV-2/FLU/RSV plus assay is intended as an aid in the diagnosis of influenza from Nasopharyngeal swab specimens and should not be used as a sole basis for treatment. Nasal washings and aspirates are unacceptable for  Xpert Xpress SARS-CoV-2/FLU/RSV testing.  Fact Sheet for Patients: BloggerCourse.com  Fact Sheet for Healthcare Providers: SeriousBroker.it  This test is not yet approved or cleared by the [place] FDA and has been authorized for detection and/or diagnosis of SARS-CoV-2 by FDA under an Emergency Use Authorization (EUA). This EUA will remain in effect (meaning this test can be used) for the duration of the COVID-19 declaration under Section 564(b)(1) of the Act, 21 U.S.C. section 360bbb-3(b)(1), unless the authorization is terminated or revoked.     Resp Syncytial Virus by PCR NEGATIVE NEGATIVE Final    Comment: (NOTE) Fact Sheet for Patients: BloggerCourse.com  Fact Sheet for Healthcare Providers: SeriousBroker.it  This test is not yet approved or cleared by the [place] and  has been authorized for detection and/or diagnosis of SARS-CoV-2 by FDA under an Emergency Use Authorization (EUA). This EUA will remain in effect (meaning this test can be used) for the duration of the COVID-19 declaration under Section 564(b)(1) of the Act, 21 U.S.C. section 360bbb-3(b)(1), unless the authorization is terminated or revoked.  Performed at [facility], [address] [place], [place] [address]   Blood Culture (routine x 2)     Status: None   Collection Time: 03/03/23  9:32 AM   Specimen: BLOOD  Result Value Ref Range Status   Specimen Description BLOOD RIGHT ANTECUBITAL  Final   Special Requests   Final    BOTTLES DRAWN AEROBIC AND ANAEROBIC Blood Culture adequate volume   Culture   Final    NO GROWTH 5 DAYS Performed at [facility], [address] [place], [place] [address]    Report Status 03/08/2023 FINAL  Final  Blood Culture (routine x 2)     Status: None   Collection Time: 03/03/23  9:32 AM   Specimen: BLOOD  Result Value Ref Range Status    Specimen Description BLOOD RIGHT ANTECUBITAL  Final   Special Requests   Final    BOTTLES DRAWN AEROBIC AND ANAEROBIC Blood Culture results may not be optimal due to an excessive volume of blood received in culture bottles   Culture   Final    NO GROWTH 5 DAYS Performed at [facility], [address], [place], [place] [address]    Report Status 03/08/2023 FINAL  Final         Radiology Studies: No results found.      Scheduled Meds:  amitriptyline  50 mg Oral QHS   aspirin EC  81 mg Oral Daily   atorvastatin  40 mg Oral Daily   buprenorphine  1 patch Transdermal Weekly   cyanocobalamin  1,000 mcg Oral Daily   fluticasone furoate-vilanterol  1 puff Inhalation Daily   And   umeclidinium bromide  1 puff Inhalation Daily   gabapentin  600 mg Oral BID   influenza vaccine adjuvanted  0.5 mL Intramuscular Tomorrow-1000   magnesium oxide  400 mg Oral Daily   melatonin  5 mg Oral QHS   polyethylene glycol  17 g Oral Daily   predniSONE  40 mg Oral Q breakfast   pregabalin  150 mg Oral TID   Vitamin E  400 Units Oral Daily   Continuous Infusions:     LOS: 7 days     Silvano Bilis, MD [facility]   If 7PM-7AM, please contact night-coverage  03/10/2023, 11:27 AM

## 2023-03-10 NOTE — Progress Notes (Signed)
Daily Progress Note   Subjective  - 3 Days Post-Op  Follow-up left ankle fracture.  Patient is stable.  Doing well.  Pain is minimal.    Objective Vitals:   03/10/23 0006 03/10/23 0357 03/10/23 0826 03/10/23 1215  BP: 119/78 130/70 90/81 95/61   Pulse: 70 66 71 77  Resp: 18 17 16 16   Temp: 98.1 F (36.7 C) 97.8 F (36.6 C) 98.3 F (36.8 C) 98.4 F (36.9 C)  TempSrc:      SpO2: 94% 92% 93% 92%  Weight:      Height:        Physical Exam: Foot is warm and well-hydrated.  No blisters.  Edema is minimal.  Laboratory CBC    Component Value Date/Time   WBC 8.6 03/06/2023 1025   HGB 11.4 (L) 03/06/2023 1025   HGB 13.6 10/31/2013 1541   HCT 36.1 03/06/2023 1025   HCT 41.4 10/31/2013 1541   PLT 258 03/06/2023 1025   PLT 272 10/31/2013 1541    BMET    Component Value Date/Time   NA 135 03/06/2023 1025   NA 138 10/31/2013 1541   K 4.3 03/06/2023 1025   K 4.5 10/31/2013 1541   CL 102 03/06/2023 1025   CL 101 10/31/2013 1541   CO2 26 03/06/2023 1025   CO2 29 10/31/2013 1541   GLUCOSE 157 (H) 03/06/2023 1025   GLUCOSE 96 10/31/2013 1541   BUN 29 (H) 03/07/2023 1302   BUN 10 10/31/2013 1541   CREATININE 0.69 03/10/2023 0458   CREATININE 0.91 10/31/2013 1541   CALCIUM 8.2 (L) 03/06/2023 1025   CALCIUM 9.1 10/31/2013 1541   GFRNONAA >60 03/10/2023 0458   GFRNONAA >60 10/31/2013 1541   GFRAA >60 04/13/2018 1030   GFRAA >60 10/31/2013 1541    Assessment/Planning: Trimalleolar ankle fracture left ankle  Patient undergo ORIF tomorrow.  We discussed the surgery in detail.  Will plan on nonweightbearing in a splint postoperatively. Discussed use of a popliteal block to try to minimize postoperative pain.  All risk benefits alternatives and complications associated with surgery have been discussed with the patient and consent has been given.  Gwyneth Revels A  03/10/2023, 12:55 PM

## 2023-03-11 ENCOUNTER — Inpatient Hospital Stay: Payer: HMO | Admitting: Anesthesiology

## 2023-03-11 ENCOUNTER — Encounter: Payer: Self-pay | Admitting: Internal Medicine

## 2023-03-11 ENCOUNTER — Inpatient Hospital Stay: Payer: HMO

## 2023-03-11 ENCOUNTER — Encounter
Admission: EM | Disposition: A | Discharge status: Home Health | Payer: Self-pay | Source: Home / Self Care | Attending: Internal Medicine

## 2023-03-11 ENCOUNTER — Other Ambulatory Visit: Payer: Self-pay

## 2023-03-11 HISTORY — PX: ORIF ANKLE FRACTURE: SHX5408

## 2023-03-11 LAB — CBC
HCT: 35.4 % — ABNORMAL LOW (ref 36.0–46.0)
Hemoglobin: 11.4 g/dL — ABNORMAL LOW (ref 12.0–15.0)
MCH: 27.9 pg (ref 26.0–34.0)
MCHC: 32.2 g/dL (ref 30.0–36.0)
MCV: 86.6 fL (ref 80.0–100.0)
Platelets: 255 10*3/uL (ref 150–400)
RBC: 4.09 MIL/uL (ref 3.87–5.11)
RDW: 16.6 % — ABNORMAL HIGH (ref 11.5–15.5)
WBC: 8.1 10*3/uL (ref 4.0–10.5)
nRBC: 0 % (ref 0.0–0.2)

## 2023-03-11 SURGERY — OPEN REDUCTION INTERNAL FIXATION (ORIF) ANKLE FRACTURE
Anesthesia: General | Site: Ankle | Laterality: Left

## 2023-03-11 MED ORDER — BUPIVACAINE LIPOSOME 1.3 % IJ SUSP
INTRAMUSCULAR | Status: AC
  Filled 2023-03-11: qty 20

## 2023-03-11 MED ORDER — LIDOCAINE HCL (PF) 1 % IJ SOLN
INTRAMUSCULAR | Status: DC | PRN
Start: 2023-03-11 — End: 2023-03-11
  Administered 2023-03-11: 3 mL

## 2023-03-11 MED ORDER — ROCURONIUM BROMIDE 10 MG/ML (PF) SYRINGE
PREFILLED_SYRINGE | INTRAVENOUS | Status: AC
  Filled 2023-03-11: qty 10

## 2023-03-11 MED ORDER — SUCCINYLCHOLINE CHLORIDE 200 MG/10ML IV SOSY
PREFILLED_SYRINGE | INTRAVENOUS | Status: AC
  Filled 2023-03-11: qty 10

## 2023-03-11 MED ORDER — ALBUMIN HUMAN 5 % IV SOLN
INTRAVENOUS | Status: AC
  Filled 2023-03-11: qty 250

## 2023-03-11 MED ORDER — ONDANSETRON HCL 4 MG/2ML IJ SOLN
INTRAMUSCULAR | Status: DC | PRN
  Administered 2023-03-11: 4 mg via INTRAVENOUS

## 2023-03-11 MED ORDER — EPINEPHRINE PF 1 MG/ML IJ SOLN
INTRAMUSCULAR | Status: AC
  Filled 2023-03-11: qty 1

## 2023-03-11 MED ORDER — ENOXAPARIN SODIUM 40 MG/0.4ML IJ SOSY
40.0000 mg | PREFILLED_SYRINGE | INTRAMUSCULAR | Status: DC
  Administered 2023-03-12: 40 mg via SUBCUTANEOUS
  Filled 2023-03-11: qty 0.4

## 2023-03-11 MED ORDER — FENTANYL CITRATE PF 50 MCG/ML IJ SOSY
PREFILLED_SYRINGE | INTRAMUSCULAR | Status: AC
  Filled 2023-03-11: qty 1

## 2023-03-11 MED ORDER — FENTANYL CITRATE PF 50 MCG/ML IJ SOSY
50.0000 ug | PREFILLED_SYRINGE | Freq: Once | INTRAMUSCULAR | Status: AC
  Administered 2023-03-11: 50 ug via INTRAVENOUS

## 2023-03-11 MED ORDER — OXYCODONE HCL 5 MG/5ML PO SOLN
ORAL | Status: AC
  Filled 2023-03-11: qty 5

## 2023-03-11 MED ORDER — PHENYLEPHRINE HCL-NACL 20-0.9 MG/250ML-% IV SOLN
INTRAVENOUS | Status: AC
  Filled 2023-03-11: qty 250

## 2023-03-11 MED ORDER — OXYCODONE HCL 5 MG PO TABS
5.0000 mg | ORAL_TABLET | ORAL | 0 refills | Status: DC | PRN
Start: 2023-03-11 — End: 2023-09-27

## 2023-03-11 MED ORDER — SODIUM CHLORIDE 0.9 % IV SOLN: INTRAVENOUS | Status: DC | PRN

## 2023-03-11 MED ORDER — ACETAMINOPHEN 10 MG/ML IV SOLN
INTRAVENOUS | Status: AC
  Filled 2023-03-11: qty 100

## 2023-03-11 MED ORDER — SUGAMMADEX SODIUM 200 MG/2ML IV SOLN
INTRAVENOUS | Status: DC | PRN
  Administered 2023-03-11: 140 mg via INTRAVENOUS

## 2023-03-11 MED ORDER — DEXAMETHASONE SODIUM PHOSPHATE 10 MG/ML IJ SOLN
INTRAMUSCULAR | Status: DC | PRN
  Administered 2023-03-11: 10 mg via INTRAVENOUS

## 2023-03-11 MED ORDER — ONDANSETRON HCL 4 MG/2ML IJ SOLN
INTRAMUSCULAR | Status: AC
  Filled 2023-03-11: qty 2

## 2023-03-11 MED ORDER — BUPIVACAINE-EPINEPHRINE (PF) 0.25% -1:200000 IJ SOLN
INTRAMUSCULAR | Status: AC
  Filled 2023-03-11: qty 30

## 2023-03-11 MED ORDER — FENTANYL CITRATE (PF) 100 MCG/2ML IJ SOLN
INTRAMUSCULAR | Status: AC
  Filled 2023-03-11: qty 2

## 2023-03-11 MED ORDER — PHENYLEPHRINE 80 MCG/ML (10ML) SYRINGE FOR IV PUSH (FOR BLOOD PRESSURE SUPPORT)
PREFILLED_SYRINGE | INTRAVENOUS | Status: AC
  Filled 2023-03-11: qty 10

## 2023-03-11 MED ORDER — LIDOCAINE HCL (CARDIAC) PF 100 MG/5ML IV SOSY
PREFILLED_SYRINGE | INTRAVENOUS | Status: DC | PRN
  Administered 2023-03-11: 100 mg via INTRAVENOUS

## 2023-03-11 MED ORDER — 0.9 % SODIUM CHLORIDE (POUR BTL) OPTIME
TOPICAL | Status: DC | PRN
  Administered 2023-03-11: 1500 mL

## 2023-03-11 MED ORDER — BUPIVACAINE HCL (PF) 0.5 % IJ SOLN
INTRAMUSCULAR | Status: AC
  Filled 2023-03-11: qty 30

## 2023-03-11 MED ORDER — LIDOCAINE HCL (PF) 1 % IJ SOLN
INTRAMUSCULAR | Status: AC
  Filled 2023-03-11: qty 5

## 2023-03-11 MED ORDER — OXYCODONE HCL 5 MG/5ML PO SOLN
5.0000 mg | Freq: Once | ORAL | Status: AC | PRN
  Administered 2023-03-11: 5 mg via ORAL

## 2023-03-11 MED ORDER — ALBUMIN HUMAN 5 % IV SOLN
INTRAVENOUS | Status: DC | PRN
Start: 2023-03-11 — End: 2023-03-11

## 2023-03-11 MED ORDER — ACETAMINOPHEN 10 MG/ML IV SOLN
INTRAVENOUS | Status: DC | PRN
  Administered 2023-03-11: 1000 mg via INTRAVENOUS

## 2023-03-11 MED ORDER — OXYCODONE HCL 5 MG PO TABS: 5.0000 mg | ORAL_TABLET | Freq: Once | ORAL | Status: AC | PRN

## 2023-03-11 MED ORDER — SUCCINYLCHOLINE CHLORIDE 200 MG/10ML IV SOSY
PREFILLED_SYRINGE | INTRAVENOUS | Status: DC | PRN
  Administered 2023-03-11: 120 mg via INTRAVENOUS

## 2023-03-11 MED ORDER — LIDOCAINE-EPINEPHRINE (PF) 1 %-1:200000 IJ SOLN
INTRAMUSCULAR | Status: AC
  Filled 2023-03-11: qty 30

## 2023-03-11 MED ORDER — ROCURONIUM BROMIDE 100 MG/10ML IV SOLN
INTRAVENOUS | Status: DC | PRN
  Administered 2023-03-11: 30 mg via INTRAVENOUS
  Administered 2023-03-11: 20 mg via INTRAVENOUS
  Administered 2023-03-11: 10 mg via INTRAVENOUS

## 2023-03-11 MED ORDER — ALBUMIN HUMAN 5 % IV SOLN: INTRAVENOUS | Status: DC | PRN

## 2023-03-11 MED ORDER — PROPOFOL 10 MG/ML IV BOLUS
INTRAVENOUS | Status: AC
  Filled 2023-03-11: qty 20

## 2023-03-11 MED ORDER — PHENYLEPHRINE HCL (PRESSORS) 10 MG/ML IV SOLN
INTRAVENOUS | Status: DC | PRN
  Administered 2023-03-11 (×6): 80 ug via INTRAVENOUS

## 2023-03-11 MED ORDER — CLOPIDOGREL BISULFATE 75 MG PO TABS
75.0000 mg | ORAL_TABLET | Freq: Every day | ORAL | Status: DC
  Administered 2023-03-12: 75 mg via ORAL
  Filled 2023-03-11: qty 1

## 2023-03-11 MED ORDER — DEXAMETHASONE SODIUM PHOSPHATE 10 MG/ML IJ SOLN
INTRAMUSCULAR | Status: AC
  Filled 2023-03-11: qty 1

## 2023-03-11 MED ORDER — PHENYLEPHRINE HCL-NACL 20-0.9 MG/250ML-% IV SOLN
INTRAVENOUS | Status: DC | PRN
  Administered 2023-03-11: 40 ug/min via INTRAVENOUS

## 2023-03-11 MED ORDER — LIDOCAINE HCL (PF) 1 % IJ SOLN
INTRAMUSCULAR | Status: AC
  Filled 2023-03-11: qty 30

## 2023-03-11 MED ORDER — BUPIVACAINE-EPINEPHRINE (PF) 0.25% -1:200000 IJ SOLN
INTRAMUSCULAR | Status: DC | PRN
Start: 2023-03-11 — End: 2023-03-11
  Administered 2023-03-11: 10 mL

## 2023-03-11 MED ORDER — FENTANYL CITRATE (PF) 100 MCG/2ML IJ SOLN
25.0000 ug | INTRAMUSCULAR | Status: DC | PRN
  Administered 2023-03-11 (×2): 25 ug via INTRAVENOUS
  Administered 2023-03-11: 50 ug via INTRAVENOUS

## 2023-03-11 MED ORDER — BUPIVACAINE LIPOSOME 1.3 % IJ SUSP
INTRAMUSCULAR | Status: DC | PRN
Start: 2023-03-11 — End: 2023-03-11
  Administered 2023-03-11: 20 mL

## 2023-03-11 MED ORDER — PROPOFOL 10 MG/ML IV BOLUS
INTRAVENOUS | Status: DC | PRN
  Administered 2023-03-11: 90 mg via INTRAVENOUS

## 2023-03-11 MED ORDER — MIDAZOLAM HCL 2 MG/2ML IJ SOLN
INTRAMUSCULAR | Status: AC
  Filled 2023-03-11: qty 2

## 2023-03-11 SURGICAL SUPPLY — 59 items
BIT DRILL 2.0X130 SLD AO (BIT) IMPLANT
BIT DRILL CANN LONG 4.6X220 (DRILL) IMPLANT
BLADE SURG 15 STRL LF DISP TIS (BLADE) IMPLANT
BLADE SURG 15 STRL SS (BLADE) ×3
BNDG CMPR 5X4 CHSV STRCH STRL (GAUZE/BANDAGES/DRESSINGS) ×2
BNDG CMPR 75X21 PLY HI ABS (MISCELLANEOUS) ×1
BNDG CMPR STD VLCR NS LF 5.8X4 (GAUZE/BANDAGES/DRESSINGS) ×1
BNDG COHESIVE 4X5 TAN STRL LF (GAUZE/BANDAGES/DRESSINGS) ×1 IMPLANT
BNDG ELASTIC 4X5.8 VLCR NS LF (GAUZE/BANDAGES/DRESSINGS) ×1 IMPLANT
BNDG ESMARCH 4 X 12 STRL LF (GAUZE/BANDAGES/DRESSINGS)
BNDG ESMARCH 4X12 STRL LF (GAUZE/BANDAGES/DRESSINGS) ×1 IMPLANT
BNDG GAUZE DERMACEA FLUFF 4 (GAUZE/BANDAGES/DRESSINGS) ×1 IMPLANT
BNDG GZE 12X3 1 PLY HI ABS (GAUZE/BANDAGES/DRESSINGS) ×1
BNDG GZE DERMACEA 4 6PLY (GAUZE/BANDAGES/DRESSINGS) ×1
BNDG STRETCH GAUZE 3IN X12FT (GAUZE/BANDAGES/DRESSINGS) ×1 IMPLANT
CUFF TOURN SGL QUICK 18X4 (TOURNIQUET CUFF) IMPLANT
DRAPE C-ARM XRAY 36X54 (DRAPES) ×1 IMPLANT
DRAPE C-ARMOR (DRAPES) ×1 IMPLANT
DRAPE FLUOR MINI C-ARM 54X84 (DRAPES) IMPLANT
DRILL CANN LONG 4.6X220 (DRILL) ×1
DURAPREP 26ML APPLICATOR (WOUND CARE) ×1 IMPLANT
ELECT REM PT RETURN 9FT ADLT (ELECTROSURGICAL) ×1
ELECTRODE REM PT RTRN 9FT ADLT (ELECTROSURGICAL) ×1 IMPLANT
GAUZE SPONGE 4X4 12PLY STRL (GAUZE/BANDAGES/DRESSINGS) ×1 IMPLANT
GAUZE STRETCH 2X75IN STRL (MISCELLANEOUS) ×1 IMPLANT
GAUZE XEROFORM 1X8 LF (GAUZE/BANDAGES/DRESSINGS) ×1 IMPLANT
GLOVE BIO SURGEON STRL SZ7.5 (GLOVE) ×1 IMPLANT
GLOVE INDICATOR 8.0 STRL GRN (GLOVE) ×1 IMPLANT
GOWN STRL REUS W/ TWL XL LVL3 (GOWN DISPOSABLE) ×1 IMPLANT
GOWN STRL REUS W/TWL XL LVL3 (GOWN DISPOSABLE) ×1
GOWN STRL REUS W/TWL XL LVL4 (GOWN DISPOSABLE) ×1 IMPLANT
K-WIRE SMOOTH TROCAR 2.0X150 (WIRE) ×1
KIT TURNOVER KIT A (KITS) ×1 IMPLANT
KWIRE SMOOTH TROCAR 2.0X150 (WIRE) IMPLANT
LABEL OR SOLS (LABEL) ×1 IMPLANT
MANIFOLD NEPTUNE II (INSTRUMENTS) ×1 IMPLANT
NS IRRIG 1000ML POUR BTL (IV SOLUTION) IMPLANT
NS IRRIG 500ML POUR BTL (IV SOLUTION) ×1 IMPLANT
PACK EXTREMITY ARMC (MISCELLANEOUS) ×1 IMPLANT
PAD ABD DERMACEA PRESS 5X9 (GAUZE/BANDAGES/DRESSINGS) IMPLANT
PAD PREP OB/GYN DISP 24X41 (PERSONAL CARE ITEMS) ×1 IMPLANT
PLATE FIBULAR CL 11H LT (Plate) IMPLANT
PLATE MEDIAL MALLEOLUS 2H RT (Plate) IMPLANT
SCREW LOCK PLATE R3 3.5X10 (Screw) IMPLANT
SCREW LOCK PLATE R3 3.5X12 (Screw) IMPLANT
SCREW LOCK PLATE R3 3.5X14 (Screw) IMPLANT
SCREW LOCK PLATE R3 3.5X34 (Screw) IMPLANT
SCREW NONLOCKING 3.5X24 (Screw) IMPLANT
SPLINT CAST 1 STEP 4X30 (MISCELLANEOUS) ×1 IMPLANT
SPLINT PLASTER CAST FAST 5X30 (CAST SUPPLIES) ×1 IMPLANT
STAPLER SKIN PROX 35W (STAPLE) ×1 IMPLANT
STOCKINETTE M/LG 89821 (MISCELLANEOUS) ×1 IMPLANT
SUT VIC AB 2-0 CT1 27 (SUTURE) ×1
SUT VIC AB 2-0 CT1 TAPERPNT 27 (SUTURE) ×1 IMPLANT
SUT VIC AB 3-0 SH 27 (SUTURE) ×1
SUT VIC AB 3-0 SH 27X BRD (SUTURE) ×1 IMPLANT
SYR 10ML LL (SYRINGE) ×1 IMPLANT
SYR 50ML LL SCALE MARK (SYRINGE) ×1 IMPLANT
WIRE OLIVE SMOOTH 1.4MMX60MM (WIRE) IMPLANT

## 2023-03-11 NOTE — Anesthesia Procedure Notes (Signed)
Procedure Name: Intubation Date/Time: 03/11/2023 8:13 AM  Performed by: Morene Crocker, CRNAPre-anesthesia Checklist: Patient identified, Patient being monitored, Timeout performed, Emergency Drugs available and Suction available Patient Re-evaluated:Patient Re-evaluated prior to induction Oxygen Delivery Method: Circle system utilized Preoxygenation: Pre-oxygenation with 100% oxygen Induction Type: IV induction and Rapid sequence Laryngoscope Size: 3 and McGraph Grade View: Grade I Tube type: Oral Tube size: 7.0 mm Number of attempts: 1 Airway Equipment and Method: Stylet Placement Confirmation: ETT inserted through vocal cords under direct vision, positive ETCO2 and breath sounds checked- equal and bilateral Secured at: 21 cm Tube secured with: Tape Dental Injury: Teeth and Oropharynx as per pre-operative assessment  Comments: Smooth atraumatic intubation, no complications noted. Due to pt. Positive COVID-19 test, it was chosen to RSI without masking prior to intubation to decrease spread of the virus.

## 2023-03-11 NOTE — Progress Notes (Addendum)
Patient is not able to walk the distance required to go the bathroom, or he/she is unable to safely negotiate stairs required to access the bathroom.  A BSC will alleviate this problem.

## 2023-03-11 NOTE — Evaluation (Addendum)
Physical Therapy Re-Evaluation Patient Details Name: Wendy Arias MRN: [id number] DOB: 02-13-1951 Today's Date: 03/11/2023  History of Present Illness  Pt is a 72 y/o F admitted on 03/03/23 after presenting with c/o AMS. Pt found to be positive for COVID-19, CTA showing PNA & urinalysis consistent with UTI. Of note, pt with recent L ankle fx & podiatry holding sx for now; pt also with plans for LLE angiogram but this is on hold for now as well. PMH: ischemic cardiomyopathy, COPD, depression, hepatic cirrhosis, HLD, essential HTN, iron deficiency anemia, depression, anxiety, osteoporosis, 2L home O2 use, s/p L ankle ORIF 03/11/23.   Clinical Impression  Patient alert, agreeable to PT, reported 7/10 pain in LLE, also reported R eye pain, RN notified of pt request for pain medication. Pt aware of NWB status and able to maintain throughout mobility. Pt seen as a re-evaluation s/p surgery, goals updated as needed. Overall the demonstrated good tolerance to activity. Supine to sit modI, good sitting balance. She was able to laterally scoot R and L, including into the recliner with supervision. Preference is to continue to scoot and sister mentioned a knee scooter (aware of the need to speak to the MD about precautions).  Overall the patient demonstrated deficits (see "PT Problem List") that impede the patient's functional abilities, safety, and mobility and would benefit from skilled PT intervention.     Pt doffed [place] throughout, spO2 in 90s when checked intermittently.       If plan is discharge home, recommend the following: A little help with walking and/or transfers;A little help with bathing/dressing/bathroom;Assistance with cooking/housework;Assist for transportation;Help with stairs or ramp for entrance   Can travel by private vehicle        Equipment Recommendations  (drop arm bedside commode)  Recommendations for Other Services       Functional Status Assessment Patient has had a recent  decline in their functional status and demonstrates the ability to make significant improvements in function in a reasonable and predictable amount of time.     Precautions / Restrictions Precautions Precautions: Fall Required Braces or Orthoses: Other Brace Splint/Cast - Date Prophylactic Dressing Applied (if applicable): 03/11/23 Other Brace: splint/cast Restrictions Weight Bearing Restrictions: Yes LLE Weight Bearing: Non weight bearing      Mobility  Bed Mobility Overal bed mobility: Modified Independent Bed Mobility: Supine to Sit                Transfers Overall transfer level: Needs assistance Equipment used: None Transfers: Bed to chair/wheelchair/BSC            Lateral/Scoot Transfers: Supervision General transfer comment: Pt prefers lateral scoot transfers no assistance needed besides setup    Ambulation/Gait               General Gait Details: unable at this time  Stairs            Wheelchair Mobility     Tilt Bed    Modified Rankin (Stroke Patients Only)       Balance Overall balance assessment: Needs assistance Sitting-balance support: Feet supported, No upper extremity supported Sitting balance-Leahy Scale: Good                                       Pertinent Vitals/Pain Pain Assessment Pain Assessment: 0-10 Pain Score: 7  Pain Location: L ankle Pain Descriptors / Indicators: Discomfort, Guarding, Throbbing, Constant, Sore  Pain Intervention(s): Limited activity within patient's tolerance, Monitored during session, Repositioned, Patient requesting pain meds-RN notified    Home Living Family/patient expects to be discharged to:: Private residence Living Arrangements: Children Available Help at Discharge: Family;Available PRN/intermittently Type of Home: House Home Access: Level entry       Home Layout: One level Home Equipment: Rollator (4 wheels);Rolling Walker (2 wheels);Wheelchair -  manual;BSC/3in1;Transport chair;Tub bench      Prior Function Prior Level of Function : Independent/Modified Independent             Mobility Comments: Pt reports she d/c home with family assisting her, had 1 session of HHPT, was performing lateral scoots & using w/c. ADLs Comments: Since ankle fx sister has been helping wiht bathing, using TTB     Extremity/Trunk Assessment   Upper Extremity Assessment Upper Extremity Assessment: Overall WFL for tasks assessed    Lower Extremity Assessment Lower Extremity Assessment: Generalized weakness (able to lift BLE against gravity) LLE Deficits / Details: splint  in place       Communication      Cognition Arousal: Alert Behavior During Therapy: WFL for tasks assessed/performed Overall Cognitive Status: Within Functional Limits for tasks assessed                                          General Comments      Exercises     Assessment/Plan    PT Assessment Patient needs continued PT services  PT Problem List Decreased strength;Cardiopulmonary status limiting activity;Pain;Decreased activity tolerance;Decreased balance;Decreased mobility;Decreased knowledge of precautions;Decreased safety awareness;Decreased knowledge of use of DME       PT Treatment Interventions DME instruction;Balance training;Neuromuscular re-education;Stair training;Gait training;Functional mobility training;Patient/family education;Therapeutic activities;Manual techniques;Therapeutic exercise;Wheelchair mobility training;Modalities    PT Goals (Current goals can be found in the Care Plan section)  Acute Rehab PT Goals Patient Stated Goal: to have less pain PT Goal Formulation: With patient/family Time For Goal Achievement: 03/25/23 Potential to Achieve Goals: Good    Frequency Min 1X/week     Co-evaluation               AM-PAC PT "6 Clicks" Mobility  Outcome Measure Help needed turning from your back to your side while  in a flat bed without using bedrails?: None Help needed moving from lying on your back to sitting on the side of a flat bed without using bedrails?: None Help needed moving to and from a bed to a chair (including a wheelchair)?: None Help needed standing up from a chair using your arms (e.g., wheelchair or bedside chair)?: A Lot Help needed to walk in hospital room?: A Lot Help needed climbing 3-5 steps with a railing? : Total 6 Click Score: 17    End of Session   Activity Tolerance: Patient tolerated treatment well Patient left: in chair;with call bell/phone within reach;with family/visitor present Nurse Communication: Mobility status PT Visit Diagnosis: Muscle weakness (generalized) (M62.81);Pain;Other abnormalities of gait and mobility (R26.89);Difficulty in walking, not elsewhere classified (R26.2) Pain - Right/Left: Left Pain - part of body: Ankle and joints of foot    Time: 7829-5621 PT Time Calculation (min) (ACUTE ONLY): 27 min   Charges:   PT Evaluation $PT Re-evaluation: 1 Re-eval PT Treatments $Therapeutic Activity: 23-37 mins PT General Charges $$ ACUTE PT VISIT: 1 Visit         Olga Coaster PT, DPT 2:43 PM,03/11/23

## 2023-03-11 NOTE — Progress Notes (Signed)
PROGRESS NOTE    Wendy Arias  JOA:[id number] DOB: October 25, 1950 DOA: 03/03/2023 PCP: Wilford Corner, PA-C    Brief Narrative:    72 y.o. female with medical history significant of ischemic cardiomyopathy with preserved ejection fraction, COPD, depression, hepatic cirrhosis, hyperlipidemia, essential hypertension, iron deficiency anemia, depression with anxiety, osteoporosis, left ankle fracture recently who was brought to the ER with altered mental status.  Patient is on home O2 at 2 L.  In the ER however she was found to be confused.  She was scheduled to have an angiogram with vascular surgery this morning.  The daughter found her to be pale and hypoxic.  Patient also has recent left ankle fracture trimalleolar nature and was supposed to have surgery tomorrow.  While in the ER she was found to be hypoxic.  Patient also meets sepsis criteria with heart rate leukocytosis and fever.  She she turned out to have COVID-19 positive test results, also CTA showing pneumonia and urinalysis consistent with UTI.  Patient being admitted with sepsis due to pneumonia and UTI and most likely secondary to COVID-19 infection.  Patient was hypotensive initially requiring Levophed.  She has been titrated off Levophed at the moment and maintaining systolic blood pressure over 100 and MAP of more than 65.  ICU was initially consulted but now declined admission and return back to hospitalist admit.   9/6: Patient weaned off vasopressors.  Mentating more clearly.  Able to answer my questions appropriately.  Still feeling tachypneic, short of breath.  On 7 L.  9/7: Blood pressure stable.  Weaned to 1 L.  Much less tachypneic and short of breath.  9/8: Continues to clinically improved.  Remains on 1 L oxygen. 9/9: Continues to clinically improve.  Oxygen status improved.  No complaints of pain in left ankle.  Communicated with podiatry and vascular surgery.  Tentative plan for lower extremity angiography 9/9 9/10:  Status post lower extremity angiography 9/9.  Status post PTA of posterior left tibial artery and stent placement to left SFA   Assessment & Plan:   Principal Problem:   Severe sepsis (HCC) Active Problems:   COPD exacerbation (HCC)   Acute metabolic encephalopathy   Essential hypertension   HLD (hyperlipidemia)   CAD (coronary artery disease)   Depression with anxiety   Ischemic cardiomyopathy   Atherosclerosis of artery of extremity with ulceration (HCC)   Closed left ankle fracture   COVID-19 virus infection  Severe sepsis Septic shock Community acquired pneumonia COVID-19 infection Urinary tract infection Multiple sources of infection.  Our  suspicion is patient developed a COVID-19 infection and concomitant bacterial pneumonia.  Unclear whether the urinalysis results are the primary driver for shock physiology.  However shock physiology has essentially resolved at this time.  Patient mentating much more clearly.  Lactic acid only 1.  Indicates good perfusion. Plan: No IVF Completed course of azithromycin and Rocephin Continue prednisone 40 mg p.o. daily x 5 total days Monitor vitals and fever curve  Acute on chronic hypoxic respiratory failure Likely multifactorial related to underlying COPD and COVID-19 infection and community-acquired pneumonia Was on 7 L high flow on admission.   Weaned to room air Patient apparently has a 2 L requirement but is saturating adequately on room air Plan: Oxygen if necessary Steroids as above  COPD with acute exacerbation Patient wheezing on examination with increase in oxygen demand Suspect mild decompensation of COPD in the setting of acute infection Resolved, s/p 5 days prednisone  Left trimalleolar fracture  S/p operative repair today, non weight-bearing LLE, f/u 1 week, will ask PT to re-eval today, d/c home tomorrow if snf not advised  Acute metabolic encephalopathy resolved  Peripheral vascular disease Status post left  lower extremity angiogram with angioplasty and stent placement on 9/9.  Per podiatry ok to resume plavix tomorrow  Coronary artery disease Continue antiplatelet therapy as above  Essential hypertension BP controlled  Hyperlipidemia Statin  DVT prophylaxis: SQ lovenox Code Status: DNR Family Communication: sister updated @ bedside 9/13 Disposition Plan: Status is: Inpatient Remains inpatient appropriate because: needs PT re-eval today   Level of care: Progressive  Consultants:  None  Procedures:  None  Antimicrobials:   Subjective: Seen and examined.  Reports ongoing left ankle pain. Having regular BMs  Objective: Vitals:   03/11/23 1103 03/11/23 1115 03/11/23 1130 03/11/23 1145  BP: 120/80 127/84 113/66 117/77  Pulse: 77 77 75   Resp: 18 20 18    Temp: 97.6 F (36.4 C) 97.8 F (36.6 C)    TempSrc:      SpO2: 100% 96% 94% 95%  Weight:      Height:        Intake/Output Summary (Last 24 hours) at 03/11/2023 1305 Last data filed at 03/11/2023 1137 Gross per 24 hour  Intake 1150 ml  Output 820 ml  Net 330 ml   Filed Weights   03/03/23 0913  Weight: 68 kg    Examination:  General exam: NAD Respiratory system: Lungs clear.  Normal work of breathing.  Room air Cardiovascular system: S1-S2, RRR, no murmurs, no pedal edema Gastrointestinal system: Soft, NT/ND, normal bowel sounds Central nervous system: Alert and oriented. No focal neurological deficits. Extremities: Left ankle dressed Skin: warm Psychiatry: Judgement and insight appear normal. Mood & affect appropriate.     Data Reviewed: I have personally reviewed following labs and imaging studies  CBC: Recent Labs  Lab 03/05/23 1005 03/06/23 1025 03/11/23 0633  WBC 12.8* 8.6 8.1  NEUTROABS 11.1* 7.5  --   HGB 11.4* 11.4* 11.4*  HCT 35.8* 36.1 35.4*  MCV 88.2 87.8 86.6  PLT 257 258 255   Basic Metabolic Panel: Recent Labs  Lab 03/05/23 1005 03/06/23 1025 03/07/23 1302 03/10/23 0458   NA 137 135  --   --   K 4.2 4.3  --   --   CL 100 102  --   --   CO2 26 26  --   --   GLUCOSE 136* 157*  --   --   BUN 25* 24* 29*  --   CREATININE 0.84 0.76 0.83 0.69  CALCIUM 8.7* 8.2*  --   --    GFR: Estimated Creatinine Clearance: 56.7 mL/min (by C-G formula based on SCr of 0.69 mg/dL). Liver Function Tests: No results for input(s): "AST", "ALT", "ALKPHOS", "BILITOT", "PROT", "ALBUMIN" in the last 168 hours.  No results for input(s): "LIPASE", "AMYLASE" in the last 168 hours. No results for input(s): "AMMONIA" in the last 168 hours. Coagulation Profile: No results for input(s): "INR", "PROTIME" in the last 168 hours.  Cardiac Enzymes: No results for input(s): "CKTOTAL", "CKMB", "CKMBINDEX", "TROPONINI" in the last 168 hours. BNP (last 3 results) No results for input(s): "PROBNP" in the last 8760 hours. HbA1C: No results for input(s): "HGBA1C" in the last 72 hours. CBG: No results for input(s): "GLUCAP" in the last 168 hours. Lipid Profile: No results for input(s): "CHOL", "HDL", "LDLCALC", "TRIG", "CHOLHDL", "LDLDIRECT" in the last 72 hours. Thyroid Function Tests: No results for  input(s): "TSH", "T4TOTAL", "FREET4", "T3FREE", "THYROIDAB" in the last 72 hours. Anemia Panel: No results for input(s): "VITAMINB12", "FOLATE", "FERRITIN", "TIBC", "IRON", "RETICCTPCT" in the last 72 hours. Sepsis Labs: Recent Labs  Lab 03/07/23 0341 03/08/23 0434 03/09/23 0441  PROCALCITON 6.13 2.51 1.38    Recent Results (from the past 240 hour(s))  Resp panel by RT-PCR (RSV, Flu A&B, Covid) Anterior Nasal Swab     Status: Abnormal   Collection Time: 03/03/23  9:32 AM   Specimen: Anterior Nasal Swab  Result Value Ref Range Status   SARS Coronavirus 2 by RT PCR POSITIVE (A) NEGATIVE Final    Comment: (NOTE) SARS-CoV-2 target nucleic acids are DETECTED.  The SARS-CoV-2 RNA is generally detectable in upper respiratory specimens during the acute phase of infection. Positive results  are indicative of the presence of the identified virus, but do not rule out bacterial infection or co-infection with other pathogens not detected by the test. Clinical correlation with patient history and other diagnostic information is necessary to determine patient infection status. The expected result is Negative.  Fact Sheet for Patients: BloggerCourse.com  Fact Sheet for Healthcare Providers: SeriousBroker.it  This test is not yet approved or cleared by the [place] FDA and  has been authorized for detection and/or diagnosis of SARS-CoV-2 by FDA under an Emergency Use Authorization (EUA).  This EUA will remain in effect (meaning this test can be used) for the duration of  the COVID-19 declaration under Section 564(b)(1) of the A ct, 21 U.S.C. section 360bbb-3(b)(1), unless the authorization is terminated or revoked sooner.     Influenza A by PCR NEGATIVE NEGATIVE Final   Influenza B by PCR NEGATIVE NEGATIVE Final    Comment: (NOTE) The Xpert Xpress SARS-CoV-2/FLU/RSV plus assay is intended as an aid in the diagnosis of influenza from Nasopharyngeal swab specimens and should not be used as a sole basis for treatment. Nasal washings and aspirates are unacceptable for Xpert Xpress SARS-CoV-2/FLU/RSV testing.  Fact Sheet for Patients: BloggerCourse.com  Fact Sheet for Healthcare Providers: SeriousBroker.it  This test is not yet approved or cleared by the [place] FDA and has been authorized for detection and/or diagnosis of SARS-CoV-2 by FDA under an Emergency Use Authorization (EUA). This EUA will remain in effect (meaning this test can be used) for the duration of the COVID-19 declaration under Section 564(b)(1) of the Act, 21 U.S.C. section 360bbb-3(b)(1), unless the authorization is terminated or revoked.     Resp Syncytial Virus by PCR NEGATIVE NEGATIVE Final     Comment: (NOTE) Fact Sheet for Patients: BloggerCourse.com  Fact Sheet for Healthcare Providers: SeriousBroker.it  This test is not yet approved or cleared by the [place] FDA and has been authorized for detection and/or diagnosis of SARS-CoV-2 by FDA under an Emergency Use Authorization (EUA). This EUA will remain in effect (meaning this test can be used) for the duration of the COVID-19 declaration under Section 564(b)(1) of the Act, 21 U.S.C. section 360bbb-3(b)(1), unless the authorization is terminated or revoked.  Performed at [facility], [address] [place], [place] [address]   Blood Culture (routine x 2)     Status: None   Collection Time: 03/03/23  9:32 AM   Specimen: BLOOD  Result Value Ref Range Status   Specimen Description BLOOD RIGHT ANTECUBITAL  Final   Special Requests   Final    BOTTLES DRAWN AEROBIC AND ANAEROBIC Blood Culture adequate volume   Culture   Final    NO GROWTH 5 DAYS  Performed at [facility], [address] [place], [place] [address]    Report Status 03/08/2023 FINAL  Final  Blood Culture (routine x 2)     Status: None   Collection Time: 03/03/23  9:32 AM   Specimen: BLOOD  Result Value Ref Range Status   Specimen Description BLOOD RIGHT ANTECUBITAL  Final   Special Requests   Final    BOTTLES DRAWN AEROBIC AND ANAEROBIC Blood Culture results may not be optimal due to an excessive volume of blood received in culture bottles   Culture   Final    NO GROWTH 5 DAYS Performed at [facility], [address], [place], [place] [address]    Report Status 03/08/2023 FINAL  Final  Surgical PCR screen     Status: None   Collection Time: 03/10/23 10:00 PM   Specimen: Nasal Mucosa; Nasal Swab  Result Value Ref Range Status   MRSA, PCR NEGATIVE NEGATIVE Final   Staphylococcus aureus NEGATIVE NEGATIVE Final    Comment: (NOTE) The Xpert SA Assay (FDA approved  for NASAL specimens in patients 41 years of age and older), is one component of a comprehensive surveillance program. It is not intended to diagnose infection nor to guide or monitor treatment. Performed at [facility], [address] [place], [place] [address]          Radiology Studies: DG MINI C-ARM IMAGE ONLY  Result Date: 03/11/2023 There is no interpretation for this exam.  This order is for images obtained during a surgical procedure.  Please See "Surgeries" Tab for more information regarding the procedure.   [place] OR NERVE BLOCK-IMAGE ONLY [facility])  Result Date: 03/11/2023 There is no interpretation for this exam.  This order is for images obtained during a surgical procedure.  Please See "Surgeries" Tab for more information regarding the procedure.        Scheduled Meds:  amitriptyline  100 mg Oral QHS   aspirin EC  81 mg Oral Daily   atorvastatin  40 mg Oral Daily   buprenorphine  1 patch Transdermal Weekly   cyanocobalamin  1,000 mcg Oral Daily   fluticasone furoate-vilanterol  1 puff Inhalation Daily   And   umeclidinium bromide  1 puff Inhalation Daily   gabapentin  600 mg Oral BID   influenza vaccine adjuvanted  0.5 mL Intramuscular Tomorrow-1000   magnesium oxide  400 mg Oral Daily   melatonin  5 mg Oral QHS   polyethylene glycol  17 g Oral Daily   predniSONE  40 mg Oral Q breakfast   pregabalin  150 mg Oral TID   Vitamin E  400 Units Oral Daily   Continuous Infusions:     LOS: 8 days     Silvano Bilis, MD [facility]   If 7PM-7AM, please contact night-coverage  03/11/2023, 1:05 PM

## 2023-03-11 NOTE — Op Note (Signed)
Operative note   Surgeon:Adela Esteban Armed forces logistics/support/administrative officer: None    Preop diagnosis: Mall-reduced bimalleolar ankle fracture left ankle    Postop diagnosis: Same    Procedure: 1.  Open reduction with internal fixation bimalleolar ankle fracture left ankle 2.  Intraoperative fluoroscopy use without radiologist assistance    EBL: 30 mL    Anesthesia:regional and general a popliteal and saphenous block were placed in the preoperative holding area    Hemostasis: 0.25% bupivacaine with epinephrine infiltrated along the incision sites.  A total of 10 cc was used intraoperatively    Specimen: None    Complications: None    Operative indications:Wendy Arias is an 72 y.o. that presents today for surgical intervention.  The risks/benefits/alternatives/complications have been discussed and consent has been given.    Procedure:  Patient was brought into the OR and placed on the operating table in thesupine position. After anesthesia was obtained theleft lower extremity was prepped and draped in usual sterile fashion.  Attention was directed to the lateral aspect of the distal fibula where a longitudinal incision was performed.  Sharp and blunt dissection carried down to the periosteum.  Subperiosteal dissection was performed.  At this time the mal-reduced dislocated fibular fracture was noted.  A large amount of periosteal fibrotic tissue was noted.  Obvious nonunion was found at this time.  All of the fracture site was debrided away of the nonviable and fibrotic tissue down to healthy bleeding bone.  Distal distraction was placed and could not completely reduce the ankle into the mortise.  Attention was directed to the medial aspect of the ankle where the medial malleolar incision was performed.  Sharp and blunt dissection carried down to the periosteum.  Subperiosteal dissection was performed.  The fracture site was noted with a large amount of fibrotic tissue and excised.  This was taken down to healthy  bleeding bone.  At this time much better reduction of the tibiotalar joint was noted.  Attention was redirected laterally.  The bone was quite soft.  At this time and a 11 hole anatomic fibular plate from the [employer] fracture set was placed.  It was initially stabilized on the distal fibula.  The fracture was reduced manually and proximal 3.5 mm locking screws were placed.  Final reduction was performed and remaining screws were filled with 3.5 mm locking screws.  The most proximal screw hole was left open.  Good alignment was noted in all planes.  Attention was redirected medially.  The medial malleolar fracture was then reduced into a better aligned position.  This was stabilized with a K wire.  A small medial malleolus hook plate from the [employer] screw set was then placed and a 3 5 nonlocking screw was initially placed with compression with a 3.5 locking screw finally placed.  All wounds were flushed with copious amounts of irrigation.  Fluoroscopy was used throughout the procedure to assist with reduction.  Close reduction fluoroscopy was finally obtained with good alignment.  Good stability was noted.  All wounds were flushed 1 final time and closure was performed with a 2-0 Vicryl the deeper tissue, 3-0 Vicryl for the subcutaneous tissue and skin staples for the skin.  A bulky sterile dressing with a posterior splint with the foot in neutral position was applied.    Patient tolerated the procedure and anesthesia well.  Was transported from the OR to the PACU with all vital signs stable and vascular status intact. To be discharged per routine protocol.  Will follow up in approximately 1 week in the outpatient clinic.

## 2023-03-11 NOTE — TOC Progression Note (Signed)
Transition of Care [facility]) - Progression Note    Patient Details  Name: Wendy Arias MRN: [id number] Date of Birth: June 11, 1951  Transition of Care [facility]) CM/SW Contact  Truddie Hidden, RN Phone Number: 03/11/2023, 2:59 PM  Clinical Narrative:    Spoke with patient. She was advised of therapy's recommendation for [facility]. She is agreeable to was advised the DME would be delivered to her room via [employer]\.   Request for DME sent to Jon from [employer].  Expected Discharge Plan: Home w Home Health Services Barriers to Discharge: No Barriers Identified  Expected Discharge Plan and Services       Living arrangements for the past 2 months: Single Family Home                   DME Agency: [employer]                   Social Determinants of Health (SDOH) Interventions SDOH Screenings   Food Insecurity: No Food Insecurity (03/04/2023)  Housing: Low Risk  (03/04/2023)  Transportation Needs: No Transportation Needs (03/04/2023)  Utilities: Not At Risk (03/04/2023)  Depression (PHQ2-9): Low Risk  (01/27/2023)  Financial Resource Strain: High Risk (02/16/2018)  Physical Activity: Inactive (02/16/2018)  Social Connections: Unknown (02/16/2018)  Stress: Stress Concern Present (02/16/2018)  Tobacco Use: Medium Risk (03/11/2023)    Readmission Risk Interventions    07/14/2022   11:37 AM  Readmission Risk Prevention Plan  Transportation Screening Complete  PCP or Specialist Appt within 5-7 Days Complete  Home Care Screening Complete  Medication Review (RN CM) Complete

## 2023-03-11 NOTE — Anesthesia Procedure Notes (Signed)
Anesthesia Regional Block: Popliteal block   Pre-Anesthetic Checklist: , timeout performed,  Correct Patient, Correct Site, Correct Laterality,  Correct Procedure, Correct Position, site marked,  Risks and benefits discussed,  Surgical consent,  Pre-op evaluation,  At surgeon's request and post-op pain management  Laterality: Lower  Prep: chloraprep       Needles:  Injection technique: Single-shot  Needle Type: Echogenic Needle     Needle Length: 9cm  Needle Gauge: 21     Additional Needles:   Procedures:,,,, ultrasound used (permanent image in chart),,    Narrative:  Start time: 03/11/2023 7:32 AM End time: 03/11/2023 7:33 AM Injection made incrementally with aspirations every 5 mL.  Performed by: Personally  Anesthesiologist: Louie Boston, MD  Additional Notes: Patient's chart reviewed and they were deemed appropriate candidate for procedure, at surgeon's request. Patient educated about risks, benefits, and alternatives of the block including but not limited to: temporary or permanent nerve damage, bleeding, infection, damage to surround tissues, block failure, local anesthetic toxicity. Patient expressed understanding. A formal time-out was conducted consistent with institution rules.  Monitors were applied, and minimal sedation used. The site was prepped with skin prep and allowed to dry, and sterile gloves were used. A high frequency linear ultrasound probe with probe cover was utilized throughout. Popliteal artery pulsatile and visualized in popliteal fossa along with adjacent sciatic nerve and its branch point, which appeared anatomically normal, local anesthetic injected around them just proximal to the branch point, and echogenic block needle trajectory was monitored throughout. Aspiration performed every 5ml. Blood vessels were avoided. All injections were performed without resistance and free of blood and paresthesias. The patient tolerated the procedure  well.  Injectate: 20cc 0.5% bupivicaine

## 2023-03-11 NOTE — H&P (Addendum)
HISTORY AND PHYSICAL INTERVAL NOTE:  03/11/2023  7:20 AM  Wendy Arias  has presented today for surgery, with the diagnosis of S.82.892G - Closed fracture of left ankle with delayed healing I73.9 - Peripheral vascular disease I25.10 - Coronary artery disease involving native coronary artery of native heart eithout angina pectoris.  The various methods of treatment have been discussed with the patient.  No guarantees were given.  After consideration of risks, benefits and other options for treatment, the patient has consented to surgery.  I have reviewed the patients' chart and labs.     There have been no changes to this history and physical examination.  Pt was found to be covid +.  Underwent revascularization this week and has dislocated ankle and needs urgent ORIF.  Gwyneth Revels A

## 2023-03-11 NOTE — Transfer of Care (Addendum)
Immediate Anesthesia Transfer of Care Note  Patient: Wendy Arias  Procedure(s) Performed: OPEN REDUCTION INTERNAL FIXATION (ORIF) ANKLE FRACTURE ANKLE TRIMALLEOLAR (Left: Ankle)  Patient Location: OR # 3  Anesthesia Type:General  Level of Consciousness: awake, alert , and oriented  Airway & Oxygen Therapy: Patient Spontanous Breathing and Patient connected to nasal cannula oxygen  Post-op Assessment: Report given to RN and Post -op Vital signs reviewed and stable  Post vital signs: Reviewed and stable  Last Vitals:  Vitals Value Taken Time  BP 118/74 1109  Temp 35.9 1109  Pulse 79 1109  Resp 18 1109  SpO2 96 1109   Pt. Being recovered by PACU RN Larita Fife in the room procedure was completed in due to pt. +COVID status. Pt. Fully alert and responsive.  Last Pain:  Vitals:   03/11/23 0658  TempSrc:   PainSc: 9       Patients Stated Pain Goal: 0 (03/10/23 1714)  Complications: No notable events documented.

## 2023-03-11 NOTE — Discharge Instructions (Signed)
[facility]  POST OPERATIVE INSTRUCTIONS FOR DR. Ether Griffins AND DR. BAKER [facility] P[facility]   Take your medication as prescribed.  Pain medication should be taken only as needed.  Keep the dressing clean, dry and intact.  Keep your foot elevated above the heart level for the first 48 hours.  We have instructed you to be non-weight bearing.  Always wear your post-op shoe when walking.  Always use your crutches if you are to be non-weight bearing.  Do not take a shower. Baths are permissible as long as the foot is kept out of the water.   Every hour you are awake:  Bend your knee 15 times.   Call [facility] [phone]) if any of the following problems occur: You develop a temperature or fever. The bandage becomes saturated with blood. Medication does not stop your pain. Injury of the foot occurs. Any symptoms of infection including redness, odor, or red streaks running from wound.

## 2023-03-11 NOTE — Anesthesia Preprocedure Evaluation (Addendum)
Anesthesia Evaluation  Patient identified by MRN, date of birth, ID band Patient awake    Reviewed: Allergy & Precautions, NPO status , Patient's Chart, lab work & pertinent test results  History of Anesthesia Complications (+) PONV and history of anesthetic complications  Airway Mallampati: IV  TM Distance: >3 FB Neck ROM: full    Dental  (+) Poor Dentition, Edentulous Upper, Edentulous Lower   Pulmonary asthma , pneumonia, unresolved, COPD (on 2L O2 at home),  oxygen dependent, Patient abstained from smoking., former smoker COVID +    Pulmonary exam normal        Cardiovascular hypertension, On Medications + CAD, + Past MI, + Peripheral Vascular Disease, +CHF and + DOE  Normal cardiovascular exam  Echo 8/7 IMPRESSIONS     1. Left ventricular ejection fraction, by estimation, is 60 to 65%. The  left ventricle has normal function. The left ventricle has no regional  wall motion abnormalities. Left ventricular diastolic parameters are  consistent with Grade I diastolic  dysfunction (impaired relaxation).   2. Right ventricular systolic function is normal. The right ventricular  size is normal. Mildly increased right ventricular wall thickness.   3. The mitral valve is normal in structure. Trivial mitral valve  regurgitation.   4. The aortic valve is normal in structure. Aortic valve regurgitation is  not visualized.      Neuro/Psych  PSYCHIATRIC DISORDERS Anxiety Depression     Neuromuscular disease (CRPS)    GI/Hepatic Neg liver ROS,GERD  Medicated,,  Endo/Other  negative endocrine ROS    Renal/GU      Musculoskeletal   Abdominal   Peds  Hematology  (+) Blood dyscrasia, anemia   Anesthesia Other Findings Past Medical History: 06/07/2004: (HFpEF) heart failure with preserved ejection fraction  (HCC)     Comment:  a.) TTE 06/07/2004: EF 50-55%; inf HK. b.) TTE               04/17/2018: EF 45%; GLS -14.6%;  inferoseptal and               inferoposterior HK; sev LA enlargement; mild PR, mod TR,               sev MR; G2DD. c.) TTE 12/05/2018: EF >55%, mild LVH; mild              MR. d.) TTE 12/18/2020: EF >55%; mild LA enlargement;               triv TR/PR, mod MR; G1DD; e.) TTE 07/13/2022: EF 55-60%,               no RWMAs, mild MR;  f.) TTE 02/02/2023: EF 60-65%, no               RMWas, triv MR, G1DD No date: Acute metabolic encephalopathy No date: Acute respiratory failure with hypoxia (HCC) No date: Anemia of chronic disease No date: Anxiety No date: Aortic atherosclerosis (HCC) No date: Asthma 06/08/2004: CAD (coronary artery disease)     Comment:  a.) NSTEMI 06/05/2004. b.) LHC 06/08/2004: EF 70%; 50%               mLAD, 50% dLAD, 99% OM3, 50% pRCA, 50% mRCA; PCI to OM3               placing a DES x 1 (unknown type). c.) Lexi scan               09/14/2021: normal LV function with no ischemia  or scar. No date: Complex regional pain syndrome type 2 of both lower  extremities     Comment:  a.) followed by pain management; on transdermal               buprenorphine No date: COPD (chronic obstructive pulmonary disease) (HCC) No date: Depression No date: Diverticulosis No date: DOE (dyspnea on exertion) No date: Dysphagia 02/16/2018: Empyema of right pleural space (HCC) No date: Hepatic cirrhosis (HCC) 03/03/2023: History of 2019 novel coronavirus disease (COVID-19) No date: HLD (hyperlipidemia) No date: Hypertension No date: IDA (iron deficiency anemia) 04/11/2018: Infrarenal abdominal aortic aneurysm (AAA) without  rupture (HCC)     Comment:  a.) CT AP 04/11/2018: 2.5 cm; b.) CT AP 01/05/2022: 2.9               cm; c.) CTA chest 03/03/2023: 2.9 cm No date: Ischemic cardiomyopathy     Comment:  a.) TTE 06/07/2004: 50-55%; b.) TTE 04/17/2018: EF 45%;               c.) TTE 12/05/2018: >55%; d.) TTE 12/18/2020: >55%; e.)               MV 09/14/2021: EF 64%; f.) TTE 07/13/2022: EF  55-60%; g.)              TTE 02/02/2023: 60-65% No date: Long term current use of aspirin No date: Long term current use of clopidogrel 06/05/2004: NSTEMI (non-ST elevated myocardial infarction) (HCC)     Comment:  a.)  PCI on 06/08/2004 placing a DES x1 (unknown type)               to 99% lesion in OM3. No date: Osteopenia No date: Osteoporosis No date: Peripheral vascular disease (HCC)     Comment:  a.) s/p BILATERAL femoral endarterectomies and open               angioplasty with BILATERAL EIA stenting 09/16/2021 No date: Pneumonia No date: PONV (postoperative nausea and vomiting)  Past Surgical History: 04/12/2017: COLONOSCOPY WITH PROPOFOL; N/A     Comment:  Procedure: COLONOSCOPY WITH PROPOFOL;  Surgeon:               Christena Deem, MD;  Location: [facility] END[facility];                Service: Endoscopy;  Laterality: N/A; 06/08/2004: CORONARY ANGIOPLASTY WITH STENT PLACEMENT; Left     Comment:  Procedure: CORONARY ANGIOPLASTY WITH STENT PLACEMENT;               Location: [facility]; Surgeon: Rudean Hitt, MD 09/16/2021: ENDARTERECTOMY FEMORAL; Bilateral     Comment:  Procedure: ENDARTERECTOMY FEMORAL;  Surgeon: Annice Needy, MD;  Location: [facility] ORS;  Service: Vascular;                Laterality: Bilateral; 11/28/2017: ENDOBRONCHIAL ULTRASOUND; N/A     Comment:  Procedure: ENDOBRONCHIAL ULTRASOUND;  Surgeon:               Shane Crutch, MD;  Location: [facility] ORS;  Service:              Pulmonary;  Laterality: N/A; 09/16/2021: INSERTION OF ILIAC STENT; Bilateral     Comment:  Procedure: INSERTION OF ILIAC STENT;  Surgeon: Annice Needy, MD;  Location: [facility] ORS;  Service: Vascular;                Laterality: Bilateral; 08/12/2021: LOWER EXTREMITY ANGIOGRAPHY; Right     Comment:  Procedure: Lower Extremity Angiography;  Surgeon: Annice Needy, MD;  Location: [facility] INVASIVE CV LAB;  Service:               Cardiovascular;  Laterality:  Right; 03/07/2023: LOWER EXTREMITY ANGIOGRAPHY; Left     Comment:  Procedure: Lower Extremity Angiography;  Surgeon: Annice Needy, MD;  Location: [facility] INVASIVE CV LAB;  Service:               Cardiovascular;  Laterality: Left; No date: PARTIAL HYSTERECTOMY; N/A No date: REDUCTION MAMMAPLASTY 03/2018: VIDEO ASSISTED THORACOSCOPY (VATS)/THOROCOTOMY; Right     Comment:  Procedure: VIDEO ASSISTED THORACOSCOPY               (VATS)/THOROCOTOMY (partial decortication, loculated               empyema); Location: [facility]  BMI    Body Mass Index: 27.44 kg/m      Reproductive/Obstetrics negative OB ROS                             Anesthesia Physical Anesthesia Plan  ASA: 3 and emergent  Anesthesia Plan: General ETT   Post-op Pain Management: Toradol IV (intra-op)*, Ofirmev IV (intra-op)* and Regional block*   Induction: Intravenous  PONV Risk Score and Plan: 4 or greater and Ondansetron, Dexamethasone, Midazolam and Treatment may vary due to age or medical condition  Airway Management Planned: Oral ETT  Additional Equipment:   Intra-op Plan:   Post-operative Plan: Extubation in OR  Informed Consent: I have reviewed the patients History and Physical, chart, labs and discussed the procedure including the risks, benefits and alternatives for the proposed anesthesia with the patient or authorized representative who has indicated his/her understanding and acceptance.    Continue DNR.   Dental Advisory Given  Plan Discussed with: Anesthesiologist, CRNA and Surgeon  Anesthesia Plan Comments: (Patient consented for risks of anesthesia including but not limited to:  - adverse reactions to medications - damage to eyes, teeth, lips or other oral mucosa - nerve damage due to positioning  - sore throat or hoarseness - Damage to heart, brain, nerves, lungs, other parts of body or loss of life  Patient voiced understanding.)       Anesthesia Quick  Evaluation

## 2023-03-11 NOTE — Anesthesia Postprocedure Evaluation (Signed)
Anesthesia Post Note  Patient: Wendy Arias  Procedure(s) Performed: OPEN REDUCTION INTERNAL FIXATION (ORIF) ANKLE FRACTURE ANKLE TRIMALLEOLAR (Left: Ankle)  Patient location during evaluation: PACU Anesthesia Type: General and Regional Level of consciousness: awake and alert Pain management: pain level controlled Vital Signs Assessment: post-procedure vital signs reviewed and stable Respiratory status: spontaneous breathing, nonlabored ventilation, respiratory function stable and patient connected to nasal cannula oxygen Cardiovascular status: blood pressure returned to baseline and stable Postop Assessment: no apparent nausea or vomiting Anesthetic complications: no   No notable events documented.   Last Vitals:  Vitals:   03/11/23 1130 03/11/23 1145  BP: 113/66 117/77  Pulse: 75   Resp: 18   Temp:    SpO2: 94% 95%    Last Pain:  Vitals:   03/11/23 1145  TempSrc:   PainSc: 4                  Louie Boston

## 2023-03-11 NOTE — OR Nursing (Signed)
Clemetine Marker, [employer] vendor was outside of room for consult, but not present in room during case due to airborne precautions.

## 2023-03-11 NOTE — Progress Notes (Addendum)
Patient underwent ORIF left ankle fracture today.  Will be discharged back to the hospitalist.  Patient should remain nonweightbearing.  Keep splint clean dry and intact.  Do not allow this to get wet.  Discharge instructions have been placed as well as postoperative pain medication has been sent to her pharmacy.  Patient to follow-up in outpatient clinic in 1 week with me.  From podiatry standpoint patient will be stable for discharge tomorrow.  Can restart Plavix tomorrow.  Can continue Plavix outpatient per vascular recommendations.

## 2023-03-12 LAB — CBC
HCT: 29.4 % — ABNORMAL LOW (ref 36.0–46.0)
Hemoglobin: 9.4 g/dL — ABNORMAL LOW (ref 12.0–15.0)
MCH: 27.7 pg (ref 26.0–34.0)
MCHC: 32 g/dL (ref 30.0–36.0)
MCV: 86.7 fL (ref 80.0–100.0)
Platelets: 245 10*3/uL (ref 150–400)
RBC: 3.39 MIL/uL — ABNORMAL LOW (ref 3.87–5.11)
RDW: 16.9 % — ABNORMAL HIGH (ref 11.5–15.5)
WBC: 12.5 10*3/uL — ABNORMAL HIGH (ref 4.0–10.5)
nRBC: 0 % (ref 0.0–0.2)

## 2023-03-12 MED ORDER — HYDROMORPHONE HCL 2 MG PO TABS: 2.0000 mg | ORAL_TABLET | Freq: Two times a day (BID) | ORAL | 0 refills | Status: AC | PRN

## 2023-03-12 MED ORDER — BUPRENORPHINE 5 MCG/HR TD PTWK: 1.0000 | MEDICATED_PATCH | TRANSDERMAL | Status: AC

## 2023-03-12 NOTE — Discharge Summary (Signed)
Wendy Arias QMV:[id number] DOB: 24-Feb-1951 DOA: 03/03/2023  PCP: Wilford Corner, PA-C  Admit date: 03/03/2023 Discharge date: 03/12/2023  Time spent: 35 minutes  Recommendations for Outpatient Follow-up:  Pcp and vascular surgery f/u Podiatry f/u 1 week     Discharge Diagnoses:  Principal Problem:   Severe sepsis (HCC) Active Problems:   COPD exacerbation (HCC)   Acute metabolic encephalopathy   Essential hypertension   HLD (hyperlipidemia)   CAD (coronary artery disease)   Depression with anxiety   Ischemic cardiomyopathy   Atherosclerosis of artery of extremity with ulceration (HCC)   Closed left ankle fracture   COVID-19 virus infection   Discharge Condition: stable  Diet recommendation: heart healthy  Filed Weights   03/03/23 0913  Weight: 68 kg    History of present illness:  From admission h and p Wendy Arias is a 72 y.o. female with medical history significant of ischemic cardiomyopathy with preserved ejection fraction, COPD, depression, hepatic cirrhosis, hyperlipidemia, essential hypertension, iron deficiency anemia, depression with anxiety, osteoporosis, left ankle fracture recently who was brought to the ER with altered mental status.  Patient is on home O2 at 2 L.  In the ER however she was found to be confused.  She was scheduled to have an angiogram with vascular surgery this morning.  The daughter found her to be pale and hypoxic.  Patient also has recent left ankle fracture trimalleolar nature and was supposed to have surgery tomorrow.  While in the ER she was found to be hypoxic.  Patient also meets sepsis criteria with heart rate leukocytosis and fever.  She she turned out to have COVID-19 positive test results, also CTA showing pneumonia and urinalysis consistent with UTI.  Patient being admitted with sepsis due to pneumonia and UTI and most likely secondary to COVID-19 infection.  Patient was hypotensive initially requiring Levophed.  She has been  titrated off Levophed at the moment and maintaining systolic blood pressure over 100 and MAP of more than 65.  ICU was initially consulted but now declined admission and return back to hospitalist admit.    Hospital Course:  Patient presents with confusion, fever, hypoxia. Found to have covid pneumonia and acute hypoxic respiratory failure. Also treated for CAP with course of azithromycin/rocephin. Received steroids for covid. Respiratory symptoms and encephalopathy resolved. Has PAD and underwent angiogram with stent placement with vascular surgery on 9/9. Then had scheduled operative repair of left trimalleolar fracture with podiatry on 9/13 (the fracture happened prior to hospitlization and the timing of the operative repair coincided with her hospitalization). Evaluated by PT and deemed suitable for home with home health. Has history chronic pain and was given PO dilaudid to supplement home narcotic regimen, anticipate pain control will be challenging.   Procedures: See above   Consultations: Vascular surgery  Discharge Exam: Vitals:   03/12/23 0845 03/12/23 0953  BP: (!) 89/64 109/62  Pulse: 71 74  Resp: 14   Temp: 98 F (36.7 C)   SpO2: 95%     General exam: NAD Respiratory system: Lungs clear.  Normal work of breathing.  Room air Cardiovascular system: S1-S2, RRR, no murmurs, no pedal edema Gastrointestinal system: Soft, NT/ND, normal bowel sounds Central nervous system: Alert and oriented. No focal neurological deficits. Extremities: Left ankle dressed Skin: warm Psychiatry: Judgement and insight appear normal. Mood & affect appropriate.   Discharge Instructions   Discharge Instructions     Diet - low sodium heart healthy   Complete by: As directed  Increase activity slowly   Complete by: As directed       Allergies as of 03/12/2023       Reactions   Penicillins Other (See Comments)   Tolerated cephalosporins with no significant documented ADRs -3rd  generation: CEFTRIAXONE: 04/14/2018, 05/26/2021 and CEFDINIR: 04/14/2018  -4th generation CEFEPIME: 04/18/2018 Has tolerated amoxicillin-clavulanate in the past.  PCN rxn causing immediate rash, facial/tongue/throat swelling, SOB or lightheadedness with hypotension: Yes No reaction causing severe rash involving mucus membranes or skin necrosis, reaction that required hospitalization, or reaction occurring within last 10 years.        Medication List     STOP taking these medications    torsemide 20 MG tablet Commonly known as: DEMADEX       TAKE these medications    acetaminophen 500 MG tablet Commonly known as: TYLENOL Take 2 tablets (1,000 mg total) by mouth every 8 (eight) hours.   albuterol 108 (90 Base) MCG/ACT inhaler Commonly known as: VENTOLIN HFA Inhale into the lungs every 6 (six) hours as needed for wheezing or shortness of breath.   amitriptyline 100 MG tablet Commonly known as: ELAVIL Take 100 mg by mouth at bedtime.   aspirin EC 81 MG tablet Take 1 tablet (81 mg total) by mouth daily.   atorvastatin 40 MG tablet Commonly known as: LIPITOR Take 40 mg by mouth daily.   buprenorphine 5 MCG/HR Ptwk Commonly known as: Butrans Place 1 patch onto the skin once a week for 28 days. Replaces on Saturday What changed:  additional instructions Another medication with the same name was removed. Continue taking this medication, and follow the directions you see here.   clopidogrel 75 MG tablet Commonly known as: PLAVIX TAKE 1 TABLET BY MOUTH DAILY AT 6AM What changed: See the new instructions.   cyanocobalamin 1000 MCG tablet Commonly known as: VITAMIN B12 Take 1,000 mcg by mouth daily.   gabapentin 300 MG capsule Commonly known as: NEURONTIN Take 2 capsules (600 mg total) by mouth at bedtime. What changed:  when to take this additional instructions   HYDROcodone-acetaminophen 5-325 MG tablet Commonly known as: NORCO/VICODIN Take 1-2 tablets by mouth  every 6 (six) hours as needed for moderate pain.   HYDROmorphone 2 MG tablet Commonly known as: Dilaudid Take 1 tablet (2 mg total) by mouth every 12 (twelve) hours as needed for up to 7 days for severe pain.   hydrOXYzine 25 MG tablet Commonly known as: ATARAX TAKE ONE (1) TABLET BY MOUTH TWO TIMES PER DAY AS NEEDED FOR ITCHING   ipratropium-albuterol 0.5-2.5 (3) MG/3ML Soln Commonly known as: DUONEB Take 3 mLs by nebulization every 6 (six) hours as needed.   Magnesium 400 MG Caps Take 1 tablet by mouth daily.   nitroGLYCERIN 0.4 MG SL tablet Commonly known as: NITROSTAT Place under the tongue.   oxyCODONE 5 MG immediate release tablet Commonly known as: Roxicodone Take 1 tablet (5 mg total) by mouth every 4 (four) hours as needed for severe pain. What changed:  how much to take reasons to take this   pregabalin 150 MG capsule Commonly known as: LYRICA Take 150 mg by mouth 3 (three) times daily.   senna-docusate 8.6-50 MG tablet Commonly known as: Senokot-S Take 2 tablets by mouth at bedtime.   tiZANidine 2 MG tablet Commonly known as: ZANAFLEX Take 1-2 tablets (2-4 mg total) by mouth at bedtime as needed for muscle spasms.   Trelegy Ellipta 100-62.5-25 MCG/ACT Aepb Generic drug: Fluticasone-Umeclidin-Vilant Inhale 1 puff into the  lungs daily.   Vitamin E 268 MG (400 UNIT) Caps Take 400 Units by mouth daily.               Durable Medical Equipment  (From admission, onward)           Start     Ordered   03/11/23 1433  For home use only DME Bedside commode  Once       Question:  Patient needs a bedside commode to treat with the following condition  Answer:  Ankle fracture   03/11/23 1432           Allergies  Allergen Reactions   Penicillins Other (See Comments)    Tolerated cephalosporins with no significant documented ADRs -3rd generation: CEFTRIAXONE: 04/14/2018, 05/26/2021 and CEFDINIR: 04/14/2018  -4th generation CEFEPIME:  04/18/2018  Has tolerated amoxicillin-clavulanate in the past.   PCN rxn causing immediate rash, facial/tongue/throat swelling, SOB or lightheadedness with hypotension: Yes  No reaction causing severe rash involving mucus membranes or skin necrosis, reaction that required hospitalization, or reaction occurring within last 10 years.    Follow-up Information     Debbra Riding Hestle, PA-C Follow up.   Specialty: Family Medicine Contact information: [address] [place] [address] [phone]         Gwyneth Revels, DPM Follow up.   Specialty: Podiatry Why: one week as scheduled Contact information: [address] [place] [address] [phone]                  The results of significant diagnostics from this hospitalization (including imaging, microbiology, ancillary and laboratory) are listed below for reference.    Significant Diagnostic Studies: DG MINI C-ARM IMAGE ONLY  Result Date: 03/11/2023 There is no interpretation for this exam.  This order is for images obtained during a surgical procedure.  Please See "Surgeries" Tab for more information regarding the procedure.   [place] OR NERVE BLOCK-IMAGE ONLY [facility])  Result Date: 03/11/2023 There is no interpretation for this exam.  This order is for images obtained during a surgical procedure.  Please See "Surgeries" Tab for more information regarding the procedure.   PERIPHERAL VASCULAR CATHETERIZATION  Result Date: 03/07/2023 See surgical note for result.  CT Angio Chest PE W and/or Wo Contrast  Result Date: 03/03/2023 CLINICAL DATA:  Confused.  Hypoxic. EXAM: CT ANGIOGRAPHY CHEST WITH CONTRAST TECHNIQUE: Multidetector CT imaging of the chest was performed using the standard protocol during bolus administration of intravenous contrast. Multiplanar CT image reconstructions and MIPs were obtained to evaluate the vascular anatomy. RADIATION DOSE REDUCTION: This exam was performed according to  the departmental dose-optimization program which includes automated exposure control, adjustment of the mA and/or kV according to patient size and/or use of iterative reconstruction technique. CONTRAST:  75mL OMNIPAQUE IOHEXOL 350 MG/ML SOLN COMPARISON:  Chest CT without contrast 05/26/2021.  X-ray 03/03/2023 FINDINGS: Cardiovascular: Heart is nonenlarged. No pericardial effusion. The thoracic aorta has a normal course and caliber with some vascular calcifications. 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 Patent lumen narrows to focus of only 1.5 mm. Please correlate for any symptoms including for subclavian steal. Coronary artery calcifications are seen. No segmental or larger pulmonary embolism. Mediastinum/Nodes: Normal caliber thoracic esophagus. Slightly heterogeneous thyroid. No specific abnormal lymph node enlargement identified in the axillary regions. Some prominent bilateral hilar nodes are identified. There are some enlarged mediastinal nodes. This includes precarinal  node right of midline on series 6, image 151 measuring 2.3 by 2.0 cm. Other nodes identified elsewhere in the mediastinum including AP window, subcarinal. Lungs/Pleura: Consolidative opacity along the left lower lobe with air bronchograms. There is some mild areas in the right lower lobe, middle lobe. Underlying centrilobular emphysematous changes with interstitial areas of thickening. No pneumothorax. No significant pleural effusion on the right. Trace on the left. Upper Abdomen: Dilated gallbladder in the upper abdomen. Please correlate with symptoms. There is a ectasia of the visualized abdominal aorta measuring up to 2.9 cm. Musculoskeletal: There is moderate compression deformity once again of the L1 level with some kyphosis and canal encroachment but unchanged from the prior examination. Review of the MIP images confirms the above findings.  IMPRESSION: No pulmonary embolism identified.  Some breathing motion. Consolidative opacity left lower lobe consistent with a pneumonia with trace pleural fluid. Some mild patchy opacities elsewhere. Recommend follow-up to confirm clearance. Several enlarged mediastinal greater than hilar lymph nodes. Please correlate for any known history. These could be reactive and recommend follow up after treatment to see if these persist. Scattered vascular calcifications. 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 Please correlate for any symptomatology including for subclavian steal. Dilated gallbladder. If there is concern of acute gallbladder pathology ultrasound can be performed when clinically appropriate. 2.9 cm abdominal aortic dilatation. Recommend follow-up ultrasound every 5 years. This recommendation follows ACR consensus guidelines: White Paper of the ACR Incidental Findings Committee II on Vascular Findings. J Am Coll Radiol 2013; 10:789-794. Aortic Atherosclerosis (ICD10-I70.0) and Emphysema (ICD10-J43.9). Electronically Signed   By: Karen Kays M.D.   On: 03/03/2023 12:27   CT Head Wo Contrast  Result Date: 03/03/2023 CLINICAL DATA:  Mental status change, unknown cause. EXAM: CT HEAD WITHOUT CONTRAST TECHNIQUE: Contiguous axial images were obtained from the base of the skull through the vertex without intravenous contrast. RADIATION DOSE REDUCTION: This exam was performed according to the departmental dose-optimization program which includes automated exposure control, adjustment of the mA and/or kV according to patient size and/or use of iterative reconstruction technique. COMPARISON:  Head CT 07/13/2022 and MRI 11/03/2017 FINDINGS: Brain: There is no evidence of an acute infarct, intracranial hemorrhage, mass, midline shift, or extra-axial fluid collection. The ventricles and sulci are within normal limits for age. Vascular: Calcified  atherosclerosis at the skull base. No hyperdense vessel. Skull: No acute fracture or suspicious osseous lesion. Sinuses/Orbits: Visualized paranasal sinuses and mastoid air cells are clear. Unremarkable orbits. Other: None. IMPRESSION: Unremarkable CT appearance of the brain for age. Electronically Signed   By: Sebastian Ache M.D.   On: 03/03/2023 12:21   DG Chest Port 1 View  Result Date: 03/03/2023 CLINICAL DATA:  Shortness of breath EXAM: PORTABLE CHEST 1 VIEW COMPARISON:  X-ray 01/31/2023 FINDINGS: No pneumothorax or effusion. Underinflation. Normal cardiopericardial silhouette when adjusting for level of inflation. There is some prominence of the central vasculature. There is some ill-defined opacity along the left lung base. An infiltrate is possible. Recommend follow-up. IMPRESSION: Underinflation with developing parenchymal opacity in the left lung base. Possible infiltrate. Recommend follow-up. Electronically Signed   By: Karen Kays M.D.   On: 03/03/2023 11:00    Microbiology: Recent Results (from the past 240 hour(s))  Resp panel by RT-PCR (RSV, Flu A&B, Covid) Anterior Nasal Swab     Status: Abnormal   Collection Time: 03/03/23  9:32 AM   Specimen: Anterior Nasal Swab  Result Value  Ref Range Status   SARS Coronavirus 2 by RT PCR POSITIVE (A) NEGATIVE Final    Comment: (NOTE) SARS-CoV-2 target nucleic acids are DETECTED.  The SARS-CoV-2 RNA is generally detectable in upper respiratory specimens during the acute phase of infection. Positive results are indicative of the presence of the identified virus, but do not rule out bacterial infection or co-infection with other pathogens not detected by the test. Clinical correlation with patient history and other diagnostic information is necessary to determine patient infection status. The expected result is Negative.  Fact Sheet for Patients: BloggerCourse.com  Fact Sheet for Healthcare  Providers: SeriousBroker.it  This test is not yet approved or cleared by the [place] FDA and  has been authorized for detection and/or diagnosis of SARS-CoV-2 by FDA under an Emergency Use Authorization (EUA).  This EUA will remain in effect (meaning this test can be used) for the duration of  the COVID-19 declaration under Section 564(b)(1) of the A ct, 21 U.S.C. section 360bbb-3(b)(1), unless the authorization is terminated or revoked sooner.     Influenza A by PCR NEGATIVE NEGATIVE Final   Influenza B by PCR NEGATIVE NEGATIVE Final    Comment: (NOTE) The Xpert Xpress SARS-CoV-2/FLU/RSV plus assay is intended as an aid in the diagnosis of influenza from Nasopharyngeal swab specimens and should not be used as a sole basis for treatment. Nasal washings and aspirates are unacceptable for Xpert Xpress SARS-CoV-2/FLU/RSV testing.  Fact Sheet for Patients: BloggerCourse.com  Fact Sheet for Healthcare Providers: SeriousBroker.it  This test is not yet approved or cleared by the [place] FDA and has been authorized for detection and/or diagnosis of SARS-CoV-2 by FDA under an Emergency Use Authorization (EUA). This EUA will remain in effect (meaning this test can be used) for the duration of the COVID-19 declaration under Section 564(b)(1) of the Act, 21 U.S.C. section 360bbb-3(b)(1), unless the authorization is terminated or revoked.     Resp Syncytial Virus by PCR NEGATIVE NEGATIVE Final    Comment: (NOTE) Fact Sheet for Patients: BloggerCourse.com  Fact Sheet for Healthcare Providers: SeriousBroker.it  This test is not yet approved or cleared by the [place] FDA and has been authorized for detection and/or diagnosis of SARS-CoV-2 by FDA under an Emergency Use Authorization (EUA). This EUA will remain in effect (meaning this test can be  used) for the duration of the COVID-19 declaration under Section 564(b)(1) of the Act, 21 U.S.C. section 360bbb-3(b)(1), unless the authorization is terminated or revoked.  Performed at [facility], [address] [place], [place] [address]   Blood Culture (routine x 2)     Status: None   Collection Time: 03/03/23  9:32 AM   Specimen: BLOOD  Result Value Ref Range Status   Specimen Description BLOOD RIGHT ANTECUBITAL  Final   Special Requests   Final    BOTTLES DRAWN AEROBIC AND ANAEROBIC Blood Culture adequate volume   Culture   Final    NO GROWTH 5 DAYS Performed at [facility], [address] [place], [place] [address]    Report Status 03/08/2023 FINAL  Final  Blood Culture (routine x 2)     Status: None   Collection Time: 03/03/23  9:32 AM   Specimen: BLOOD  Result Value Ref Range Status   Specimen Description BLOOD RIGHT ANTECUBITAL  Final   Special Requests   Final    BOTTLES DRAWN AEROBIC AND ANAEROBIC Blood Culture results may not be optimal due to an excessive volume of blood received  in culture bottles   Culture   Final    NO GROWTH 5 DAYS Performed at [facility], [address] [place] [place], [place] [address]    Report Status 03/08/2023 FINAL  Final  Surgical PCR screen     Status: None   Collection Time: 03/10/23 10:00 PM   Specimen: Nasal Mucosa; Nasal Swab  Result Value Ref Range Status   MRSA, PCR NEGATIVE NEGATIVE Final   Staphylococcus aureus NEGATIVE NEGATIVE Final    Comment: (NOTE) The Xpert SA Assay (FDA approved for NASAL specimens in patients 32 years of age and older), is one component of a comprehensive surveillance program. It is not intended to diagnose infection nor to guide or monitor treatment. Performed at [facility], [address] [place], [place] [address]      Labs: Basic Metabolic Panel: Recent Labs  Lab 03/06/23 1025 03/07/23 1302 03/10/23 0458  NA 135  --   --   K 4.3  --   --    CL 102  --   --   CO2 26  --   --   GLUCOSE 157*  --   --   BUN 24* 29*  --   CREATININE 0.76 0.83 0.69  CALCIUM 8.2*  --   --    Liver Function Tests: No results for input(s): "AST", "ALT", "ALKPHOS", "BILITOT", "PROT", "ALBUMIN" in the last 168 hours. No results for input(s): "LIPASE", "AMYLASE" in the last 168 hours. No results for input(s): "AMMONIA" in the last 168 hours. CBC: Recent Labs  Lab 03/06/23 1025 03/11/23 0633 03/12/23 0618  WBC 8.6 8.1 12.5*  NEUTROABS 7.5  --   --   HGB 11.4* 11.4* 9.4*  HCT 36.1 35.4* 29.4*  MCV 87.8 86.6 86.7  PLT 258 255 245   Cardiac Enzymes: No results for input(s): "CKTOTAL", "CKMB", "CKMBINDEX", "TROPONINI" in the last 168 hours. BNP: BNP (last 3 results) Recent Labs    07/13/22 1450 01/31/23 1405  BNP 109.3* 55.3    ProBNP (last 3 results) No results for input(s): "PROBNP" in the last 8760 hours.  CBG: No results for input(s): "GLUCAP" in the last 168 hours.     Signed:  Silvano Bilis MD.  [facility] 03/12/2023, 10:47 AM

## 2023-03-12 NOTE — Plan of Care (Signed)

## 2023-03-12 NOTE — TOC Transition Note (Signed)
Transition of Care [facility]) - CM/SW Discharge Note   Patient Details  Name: Wendy Arias MRN: [id number] Date of Birth: 1951/01/27  Transition of Care [facility]) CM/SW Contact:  Liliana Cline, LCSW Phone Number: 03/12/2023, 11:02 AM   Clinical Narrative:    Patient has orders to DC home today. CSW notified Coralee North with [facility].    Final next level of care: Home w Home Health Services Barriers to Discharge: Barriers Resolved   Patient Goals and CMS Choice   Choice offered to / list presented to : [facility] POA / Guardian  Discharge Placement                         Discharge Plan and Services Additional resources added to the After Visit Summary for                    DME Agency: [employer]       HH Arranged: OT, PT [facility] Agency: [facility] Date [facility] Agency Contacted: 03/12/23   Representative spoke with at [facility] Agency: Coralee North  Social Determinants of Health (SDOH) Interventions SDOH Screenings   Food Insecurity: No Food Insecurity (03/04/2023)  Housing: Low Risk  (03/04/2023)  Transportation Needs: No Transportation Needs (03/04/2023)  Utilities: Not At Risk (03/04/2023)  Depression (PHQ2-9): Low Risk  (01/27/2023)  Financial Resource Strain: High Risk (02/16/2018)  Physical Activity: Inactive (02/16/2018)  Social Connections: Unknown (02/16/2018)  Stress: Stress Concern Present (02/16/2018)  Tobacco Use: Medium Risk (03/11/2023)     Readmission Risk Interventions    07/14/2022   11:37 AM  Readmission Risk Prevention Plan  Transportation Screening Complete  PCP or Specialist Appt within 5-7 Days Complete  Home Care Screening Complete  Medication Review (RN CM) Complete

## 2023-03-14 ENCOUNTER — Encounter: Payer: Self-pay | Admitting: Podiatry

## 2023-03-14 ENCOUNTER — Telehealth: Payer: Self-pay | Admitting: *Deleted

## 2023-03-14 DIAGNOSIS — N390 Urinary tract infection, site not specified: Secondary | ICD-10-CM | POA: Diagnosis not present

## 2023-03-14 DIAGNOSIS — J1289 Other viral pneumonia: Secondary | ICD-10-CM | POA: Diagnosis not present

## 2023-03-14 DIAGNOSIS — I110 Hypertensive heart disease with heart failure: Secondary | ICD-10-CM | POA: Diagnosis not present

## 2023-03-14 DIAGNOSIS — I5030 Unspecified diastolic (congestive) heart failure: Secondary | ICD-10-CM | POA: Diagnosis not present

## 2023-03-14 DIAGNOSIS — A419 Sepsis, unspecified organism: Secondary | ICD-10-CM | POA: Diagnosis not present

## 2023-03-14 DIAGNOSIS — U071 COVID-19: Secondary | ICD-10-CM | POA: Diagnosis not present

## 2023-03-14 DIAGNOSIS — I739 Peripheral vascular disease, unspecified: Secondary | ICD-10-CM | POA: Diagnosis not present

## 2023-03-14 DIAGNOSIS — D638 Anemia in other chronic diseases classified elsewhere: Secondary | ICD-10-CM | POA: Diagnosis not present

## 2023-03-14 DIAGNOSIS — J441 Chronic obstructive pulmonary disease with (acute) exacerbation: Secondary | ICD-10-CM | POA: Diagnosis not present

## 2023-03-14 DIAGNOSIS — G9341 Metabolic encephalopathy: Secondary | ICD-10-CM | POA: Diagnosis not present

## 2023-03-14 NOTE — Progress Notes (Unsigned)
Care Coordination  Outreach Note  03/14/2023 Name: Wendy Arias MRN: [id number] DOB: 12/06/1950   Care Coordination Outreach Attempts: An unsuccessful telephone outreach was attempted today to offer the patient information about available care coordination services.  Follow Up Plan:  Additional outreach attempts will be made to offer the patient care coordination information and services.   Encounter Outcome:  No Answer  Burman Nieves, CCMA Care Coordination Care Guide Direct Dial: [phone]

## 2023-03-15 NOTE — Progress Notes (Unsigned)
Care Coordination  Outreach Note  03/15/2023 Name: Wendy Arias MRN: [id number] DOB: [birth year]   Care Coordination Outreach Attempts: A second unsuccessful outreach was attempted today to offer the patient with information about available care coordination services.  Follow Up Plan:  Additional outreach attempts will be made to offer the patient care coordination information and services.   Encounter Outcome:  No Answer  Burman Nieves, CCMA Care Coordination Care Guide Direct Dial: [phone]

## 2023-03-16 DIAGNOSIS — I110 Hypertensive heart disease with heart failure: Secondary | ICD-10-CM | POA: Diagnosis not present

## 2023-03-16 DIAGNOSIS — I5030 Unspecified diastolic (congestive) heart failure: Secondary | ICD-10-CM | POA: Diagnosis not present

## 2023-03-16 DIAGNOSIS — U071 COVID-19: Secondary | ICD-10-CM | POA: Diagnosis not present

## 2023-03-16 DIAGNOSIS — J1289 Other viral pneumonia: Secondary | ICD-10-CM | POA: Diagnosis not present

## 2023-03-16 DIAGNOSIS — A4189 Other specified sepsis: Secondary | ICD-10-CM | POA: Diagnosis not present

## 2023-03-16 DIAGNOSIS — N390 Urinary tract infection, site not specified: Secondary | ICD-10-CM | POA: Diagnosis not present

## 2023-03-16 DIAGNOSIS — J441 Chronic obstructive pulmonary disease with (acute) exacerbation: Secondary | ICD-10-CM | POA: Diagnosis not present

## 2023-03-17 NOTE — Progress Notes (Signed)
Care Coordination  Outreach Note  03/17/2023 Name: GAGE NAGLE MRN: [id number] DOB: Oct 24, 1950   Care Coordination Outreach Attempts: A third unsuccessful outreach was attempted today to offer the patient with information about available care coordination services.  Follow Up Plan:  No further outreach attempts will be made at this time. We have been unable to contact the patient to offer or enroll patient in care coordination services  Encounter Outcome:  No Answer  Burman Nieves, [facility] Care Coordination Care Guide Direct Dial: [phone]

## 2023-03-24 DIAGNOSIS — I10 Essential (primary) hypertension: Secondary | ICD-10-CM | POA: Diagnosis not present

## 2023-03-24 DIAGNOSIS — G894 Chronic pain syndrome: Secondary | ICD-10-CM | POA: Diagnosis not present

## 2023-03-24 DIAGNOSIS — I2510 Atherosclerotic heart disease of native coronary artery without angina pectoris: Secondary | ICD-10-CM | POA: Diagnosis not present

## 2023-03-24 DIAGNOSIS — S82892G Other fracture of left lower leg, subsequent encounter for closed fracture with delayed healing: Secondary | ICD-10-CM | POA: Diagnosis not present

## 2023-03-24 DIAGNOSIS — R7303 Prediabetes: Secondary | ICD-10-CM | POA: Diagnosis not present

## 2023-03-24 DIAGNOSIS — Z09 Encounter for follow-up examination after completed treatment for conditions other than malignant neoplasm: Secondary | ICD-10-CM | POA: Diagnosis not present

## 2023-03-24 DIAGNOSIS — I5032 Chronic diastolic (congestive) heart failure: Secondary | ICD-10-CM | POA: Diagnosis not present

## 2023-03-31 ENCOUNTER — Encounter: Payer: HMO | Admitting: Student in an Organized Health Care Education/Training Program

## 2023-04-04 ENCOUNTER — Ambulatory Visit (INDEPENDENT_AMBULATORY_CARE_PROVIDER_SITE_OTHER): Payer: HMO | Admitting: Nurse Practitioner

## 2023-04-05 ENCOUNTER — Other Ambulatory Visit: Payer: Self-pay | Admitting: Family Medicine

## 2023-04-05 ENCOUNTER — Ambulatory Visit
Admission: RE | Admit: 2023-04-05 | Discharge: 2023-04-05 | Disposition: A | Discharge status: Home / Self Care | Payer: HMO | Source: Ambulatory Visit | Attending: Family Medicine | Admitting: Family Medicine

## 2023-04-05 DIAGNOSIS — M6289 Other specified disorders of muscle: Secondary | ICD-10-CM | POA: Diagnosis not present

## 2023-04-05 DIAGNOSIS — S82892A Other fracture of left lower leg, initial encounter for closed fracture: Secondary | ICD-10-CM | POA: Diagnosis not present

## 2023-04-05 DIAGNOSIS — R1012 Left upper quadrant pain: Secondary | ICD-10-CM | POA: Insufficient documentation

## 2023-04-05 DIAGNOSIS — J449 Chronic obstructive pulmonary disease, unspecified: Secondary | ICD-10-CM | POA: Diagnosis not present

## 2023-04-05 DIAGNOSIS — I7143 Infrarenal abdominal aortic aneurysm, without rupture: Secondary | ICD-10-CM | POA: Diagnosis not present

## 2023-04-05 DIAGNOSIS — K7689 Other specified diseases of liver: Secondary | ICD-10-CM | POA: Diagnosis not present

## 2023-04-05 DIAGNOSIS — R112 Nausea with vomiting, unspecified: Secondary | ICD-10-CM

## 2023-04-05 MED ORDER — IOHEXOL 300 MG/ML  SOLN
100.0000 mL | Freq: Once | INTRAMUSCULAR | Status: AC | PRN
  Administered 2023-04-05: 100 mL via INTRAVENOUS

## 2023-04-06 DIAGNOSIS — S82892A Other fracture of left lower leg, initial encounter for closed fracture: Secondary | ICD-10-CM | POA: Diagnosis not present

## 2023-04-06 DIAGNOSIS — J449 Chronic obstructive pulmonary disease, unspecified: Secondary | ICD-10-CM | POA: Diagnosis not present

## 2023-04-09 DIAGNOSIS — J449 Chronic obstructive pulmonary disease, unspecified: Secondary | ICD-10-CM | POA: Diagnosis not present

## 2023-04-18 ENCOUNTER — Other Ambulatory Visit: Payer: Self-pay | Admitting: Podiatry

## 2023-04-18 ENCOUNTER — Ambulatory Visit
Admission: RE | Admit: 2023-04-18 | Discharge: 2023-04-18 | Disposition: A | Discharge status: Home / Self Care | Payer: HMO | Source: Ambulatory Visit | Attending: Podiatry | Admitting: Podiatry

## 2023-04-18 DIAGNOSIS — L97322 Non-pressure chronic ulcer of left ankle with fat layer exposed: Secondary | ICD-10-CM | POA: Diagnosis not present

## 2023-04-18 DIAGNOSIS — I739 Peripheral vascular disease, unspecified: Secondary | ICD-10-CM | POA: Diagnosis not present

## 2023-04-18 DIAGNOSIS — R600 Localized edema: Secondary | ICD-10-CM

## 2023-04-18 DIAGNOSIS — G90523 Complex regional pain syndrome I of lower limb, bilateral: Secondary | ICD-10-CM | POA: Diagnosis not present

## 2023-04-18 DIAGNOSIS — S82892G Other fracture of left lower leg, subsequent encounter for closed fracture with delayed healing: Secondary | ICD-10-CM | POA: Diagnosis not present

## 2023-04-19 ENCOUNTER — Encounter: Payer: Self-pay | Admitting: Podiatry

## 2023-04-19 DIAGNOSIS — S91002A Unspecified open wound, left ankle, initial encounter: Secondary | ICD-10-CM | POA: Diagnosis not present

## 2023-05-06 DIAGNOSIS — S82892A Other fracture of left lower leg, initial encounter for closed fracture: Secondary | ICD-10-CM | POA: Diagnosis not present

## 2023-05-06 DIAGNOSIS — J449 Chronic obstructive pulmonary disease, unspecified: Secondary | ICD-10-CM | POA: Diagnosis not present

## 2023-05-07 DIAGNOSIS — S82892A Other fracture of left lower leg, initial encounter for closed fracture: Secondary | ICD-10-CM | POA: Diagnosis not present

## 2023-05-07 DIAGNOSIS — J449 Chronic obstructive pulmonary disease, unspecified: Secondary | ICD-10-CM | POA: Diagnosis not present

## 2023-05-09 DIAGNOSIS — G90523 Complex regional pain syndrome I of lower limb, bilateral: Secondary | ICD-10-CM | POA: Diagnosis not present

## 2023-05-09 DIAGNOSIS — L97322 Non-pressure chronic ulcer of left ankle with fat layer exposed: Secondary | ICD-10-CM | POA: Diagnosis not present

## 2023-05-09 DIAGNOSIS — S82892G Other fracture of left lower leg, subsequent encounter for closed fracture with delayed healing: Secondary | ICD-10-CM | POA: Diagnosis not present

## 2023-05-10 DIAGNOSIS — S91002A Unspecified open wound, left ankle, initial encounter: Secondary | ICD-10-CM | POA: Diagnosis not present

## 2023-05-10 DIAGNOSIS — J449 Chronic obstructive pulmonary disease, unspecified: Secondary | ICD-10-CM | POA: Diagnosis not present

## 2023-05-17 ENCOUNTER — Ambulatory Visit (INDEPENDENT_AMBULATORY_CARE_PROVIDER_SITE_OTHER): Payer: HMO | Admitting: Nurse Practitioner

## 2023-05-17 ENCOUNTER — Other Ambulatory Visit (INDEPENDENT_AMBULATORY_CARE_PROVIDER_SITE_OTHER): Payer: Self-pay | Admitting: Nurse Practitioner

## 2023-05-17 ENCOUNTER — Ambulatory Visit (INDEPENDENT_AMBULATORY_CARE_PROVIDER_SITE_OTHER): Payer: HMO

## 2023-05-17 ENCOUNTER — Encounter (INDEPENDENT_AMBULATORY_CARE_PROVIDER_SITE_OTHER): Payer: Self-pay | Admitting: Nurse Practitioner

## 2023-05-17 VITALS — BP 118/81 | HR 76 | Resp 18 | Ht 60.0 in | Wt 140.0 lb

## 2023-05-17 DIAGNOSIS — Z9889 Other specified postprocedural states: Secondary | ICD-10-CM

## 2023-05-17 DIAGNOSIS — M79605 Pain in left leg: Secondary | ICD-10-CM | POA: Diagnosis not present

## 2023-05-17 DIAGNOSIS — I70223 Atherosclerosis of native arteries of extremities with rest pain, bilateral legs: Secondary | ICD-10-CM

## 2023-05-17 DIAGNOSIS — S82892A Other fracture of left lower leg, initial encounter for closed fracture: Secondary | ICD-10-CM

## 2023-05-17 DIAGNOSIS — I10 Essential (primary) hypertension: Secondary | ICD-10-CM | POA: Diagnosis not present

## 2023-05-17 DIAGNOSIS — I739 Peripheral vascular disease, unspecified: Secondary | ICD-10-CM

## 2023-05-17 MED ORDER — DOXYCYCLINE HYCLATE 100 MG PO CAPS: 100.0000 mg | ORAL_CAPSULE | Freq: Two times a day (BID) | ORAL | 0 refills | Status: DC

## 2023-05-18 NOTE — Progress Notes (Signed)
Subjective:    Patient ID: Wendy Arias, female    DOB: 1951/05/15, 72 y.o.   MRN: [id number] Chief Complaint  Patient presents with   Follow-up    Follow up with [facility]    The patient returns to the office for followup and review status post angiogram with intervention on 03/07/2023.   Procedure: Procedure(s) Performed:             1.  Ultrasound guidance for vascular access right femoral artery             2.  Catheter placement into left common femoral artery from right femoral approach             3.  Aortogram and selective left lower extremity angiogram             4.  Mechanical thrombectomy using the penumbra CAT 6 device to the left SFA, popliteal artery, tibioperoneal trunk, and posterior tibial arteries             5.  Percutaneous transluminal angioplasty of the left posterior tibial artery with 2.5 mm diameter by 8 cm length angioplasty balloon             6.  Stent placement to the left SFA with 5 mm diameter by 10 cm length Viabahn stent             7.  StarClose closure device right femoral artery   The patient notes improvement in the lower extremity symptoms. No interval shortening of the patient's claudication distance or rest pain symptoms. No new ulcers or wounds have occurred since the last visit.  There have been no significant changes to the patient's overall health care.  No documented history of amaurosis fugax or recent TIA symptoms. There are no recent neurological changes noted. No documented history of DVT, PE or superficial thrombophlebitis. The patient denies recent episodes of angina or shortness of breath.   The noninvasive studies show open and patent SFA following intervention.  No evidence of reocclusion.    Review of Systems  Skin:  Positive for wound.  Neurological:  Positive for weakness.  All other systems reviewed and are negative.      Objective:   Physical Exam Vitals reviewed.  HENT:     Head: Normocephalic.   Cardiovascular:     Rate and Rhythm: Normal rate.  Pulmonary:     Effort: Pulmonary effort is normal.  Musculoskeletal:     Left lower leg: Edema present.  Skin:    General: Skin is warm and dry.  Neurological:     Mental Status: She is alert and oriented to person, place, and time.  Psychiatric:        Mood and Affect: Mood normal.        Thought Content: Thought content normal.        Judgment: Judgment normal.     BP 118/81 (BP Location: Left Arm)   Pulse 76   Resp 18   Ht 5' (1.524 m)   Wt 140 lb (63.5 kg)   BMI 27.34 kg/m   Past Medical History:  Diagnosis Date   (HFpEF) heart failure with preserved ejection fraction (HCC) 06/07/2004   a.) TTE 06/07/2004: EF 50-55%; inf HK. b.) TTE 04/17/2018: EF 45%; GLS -14.6%; inferoseptal and inferoposterior HK; sev LA enlargement; mild PR, mod TR, sev MR; G2DD. c.) TTE 12/05/2018: EF >55%, mild LVH; mild MR. d.) TTE 12/18/2020: EF >55%; mild LA enlargement; triv TR/PR,  mod MR; G1DD; e.) TTE 07/13/2022: EF 55-60%, no RWMAs, mild MR;  f.) TTE 02/02/2023: EF 60-65%, no RMWas, triv MR, G1DD   Acute metabolic encephalopathy    Acute respiratory failure with hypoxia (HCC)    Anemia of chronic disease    Anxiety    Aortic atherosclerosis (HCC)    Asthma    CAD (coronary artery disease) 06/08/2004   a.) NSTEMI 06/05/2004. b.) LHC 06/08/2004: EF 70%; 50% mLAD, 50% dLAD, 99% OM3, 50% pRCA, 50% mRCA; PCI to OM3 placing a DES x 1 (unknown type). c.) Lexi scan 09/14/2021: normal LV function with no ischemia or scar.   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    a.) followed by pain management; on transdermal buprenorphine   COPD (chronic obstructive pulmonary disease) (HCC)    Depression    Diverticulosis    DOE (dyspnea on exertion)    Dysphagia    Empyema of right pleural space (HCC) 02/16/2018   Hepatic cirrhosis (HCC)    History of 2019 novel coronavirus disease (COVID-19) 03/03/2023   HLD (hyperlipidemia)     Hypertension    IDA (iron deficiency anemia)    Infrarenal abdominal aortic aneurysm (AAA) without rupture (HCC) 04/11/2018   a.) CT AP 04/11/2018: 2.5 cm; b.) CT AP 01/05/2022: 2.9 cm; c.) CTA chest 03/03/2023: 2.9 cm   Ischemic cardiomyopathy    a.) TTE 06/07/2004: 50-55%; b.) TTE 04/17/2018: EF 45%; c.) TTE 12/05/2018: >55%; d.) TTE 12/18/2020: >55%; e.) MV 09/14/2021: EF 64%; f.) TTE 07/13/2022: EF 55-60%; g.) TTE 02/02/2023: 60-65%   Long term current use of aspirin    Long term current use of clopidogrel    NSTEMI (non-ST elevated myocardial infarction) (HCC) 06/05/2004   a.)  PCI on 06/08/2004 placing a DES x1 (unknown type) to 99% lesion in OM3.   Osteopenia    Osteoporosis    Peripheral vascular disease (HCC)    a.) s/p BILATERAL femoral endarterectomies and open angioplasty with BILATERAL EIA stenting 09/16/2021   Pneumonia    PONV (postoperative nausea and vomiting)     Social History   Socioeconomic History   Marital status: Divorced    Spouse name: Not on file   Number of children: Not on file   Years of education: Not on file   Highest education level: Not on file  Occupational History   Not on file  Tobacco Use   Smoking status: Former    Current packs/day: 0.50    Average packs/day: 0.5 packs/day for 20.0 years (10.0 ttl pk-yrs)    Types: Cigarettes   Smokeless tobacco: Never  Vaping Use   Vaping status: Some Days   Substances: Nicotine, Flavoring  Substance and Sexual Activity   Alcohol use: Never    Alcohol/week: 3.0 standard drinks of alcohol    Types: 1 Glasses of wine, 2 Cans of beer per week    Comment: rarely   Drug use: Never   Sexual activity: Not Currently  Other Topics Concern   Not on file  Social History Narrative   Not on file   Social Determinants of Health   Financial Resource Strain: High Risk (02/16/2018)   Overall Financial Resource Strain (CARDIA)    Difficulty of Paying Living Expenses: Very hard  Food Insecurity: No Food  Insecurity (03/04/2023)   Hunger Vital Sign    Worried About Running Out of Food in the Last Year: Never true    Ran Out of Food in the Last Year: Never  true  Transportation Needs: No Transportation Needs (03/04/2023)   PRAPARE - Administrator, Civil Service (Medical): No    Lack of Transportation (Non-Medical): No  Physical Activity: Inactive (02/16/2018)   Exercise Vital Sign    Days of Exercise per Week: 0 days    Minutes of Exercise per Session: 0 min  Stress: Stress Concern Present (02/16/2018)   [employer] of Occupational Health - Occupational Stress Questionnaire    Feeling of Stress : Rather much  Social Connections: Unknown (02/16/2018)   Social Connection and Isolation Panel [NHANES]    Frequency of Communication with Friends and Family: More than three times a week    Frequency of Social Gatherings with Friends and Family: More than three times a week    Attends Religious Services: Patient declined    Active Member of Clubs or Organizations: Patient declined    Attends Banker Meetings: Patient declined    Marital Status: Divorced  Catering manager Violence: Not At Risk (03/04/2023)   Humiliation, Afraid, Rape, and Kick questionnaire    Fear of Current or Ex-Partner: No    Emotionally Abused: No    Physically Abused: No    Sexually Abused: No    Past Surgical History:  Procedure Laterality Date   COLONOSCOPY WITH PROPOFOL N/A 04/12/2017   Procedure: COLONOSCOPY WITH PROPOFOL;  Surgeon: Christena Deem, MD;  Location: [facility];  Service: Endoscopy;  Laterality: N/A;   CORONARY ANGIOPLASTY WITH STENT PLACEMENT Left 06/08/2004   Procedure: CORONARY ANGIOPLASTY WITH STENT PLACEMENT; Location: [facility]; Surgeon: Rudean Hitt, MD   ENDARTERECTOMY FEMORAL Bilateral 09/16/2021   Procedure: ENDARTERECTOMY FEMORAL;  Surgeon: Annice Needy, MD;  Location: [facility];  Service: Vascular;  Laterality: Bilateral;   ENDOBRONCHIAL ULTRASOUND N/A 11/28/2017    Procedure: ENDOBRONCHIAL ULTRASOUND;  Surgeon: Shane Crutch, MD;  Location: [facility];  Service: Pulmonary;  Laterality: N/A;   INSERTION OF ILIAC STENT Bilateral 09/16/2021   Procedure: INSERTION OF ILIAC STENT;  Surgeon: Annice Needy, MD;  Location: [facility];  Service: Vascular;  Laterality: Bilateral;   LOWER EXTREMITY ANGIOGRAPHY Right 08/12/2021   Procedure: Lower Extremity Angiography;  Surgeon: Annice Needy, MD;  Location: [facility];  Service: Cardiovascular;  Laterality: Right;   LOWER EXTREMITY ANGIOGRAPHY Left 03/07/2023   Procedure: Lower Extremity Angiography;  Surgeon: Annice Needy, MD;  Location: [facility];  Service: Cardiovascular;  Laterality: Left;   ORIF ANKLE FRACTURE Left 03/11/2023   Procedure: OPEN REDUCTION INTERNAL FIXATION (ORIF) ANKLE FRACTURE ANKLE TRIMALLEOLAR;  Surgeon: Gwyneth Revels, DPM;  Location: [facility];  Service: Orthopedics/Podiatry;  Laterality: Left;  Regional block with general   PARTIAL HYSTERECTOMY N/A    REDUCTION MAMMAPLASTY     VIDEO ASSISTED THORACOSCOPY (VATS)/THOROCOTOMY Right 03/2018   Procedure: VIDEO ASSISTED THORACOSCOPY (VATS)/THOROCOTOMY (partial decortication, loculated empyema); Location: [facility]    Family History  Problem Relation Age of Onset   Breast cancer Mother 7   Hypertension Mother    Dementia Mother    Breast cancer Maternal Aunt 70   Breast cancer Other 35   Hypertension Father    Heart disease Father    Diabetes Father    Diabetes Sister     Allergies  Allergen Reactions   Penicillins Other (See Comments)    Tolerated cephalosporins with no significant documented ADRs -3rd generation: CEFTRIAXONE: 04/14/2018, 05/26/2021 and CEFDINIR: 04/14/2018  -4th generation CEFEPIME: 04/18/2018  Has tolerated amoxicillin-clavulanate in the past.   PCN rxn causing immediate  rash, facial/tongue/throat swelling, SOB or lightheadedness with hypotension: Yes  No reaction causing severe rash involving  mucus membranes or skin necrosis, reaction that required hospitalization, or reaction occurring within last 10 years.       Latest Ref Rng & Units 03/12/2023    6:18 AM 03/11/2023    6:33 AM 03/06/2023   10:25 AM  CBC  WBC 4.0 - 10.5 K/uL 12.5  8.1  8.6   Hemoglobin 12.0 - 15.0 g/dL 9.4  24.4  01.0   Hematocrit 36.0 - 46.0 % 29.4  35.4  36.1   Platelets 150 - 400 K/uL 245  255  258       CMP     Component Value Date/Time   NA 135 03/06/2023 1025   NA 138 10/31/2013 1541   K 4.3 03/06/2023 1025   K 4.5 10/31/2013 1541   CL 102 03/06/2023 1025   CL 101 10/31/2013 1541   CO2 26 03/06/2023 1025   CO2 29 10/31/2013 1541   GLUCOSE 157 (H) 03/06/2023 1025   GLUCOSE 96 10/31/2013 1541   BUN 29 (H) 03/07/2023 1302   BUN 10 10/31/2013 1541   CREATININE 0.69 03/10/2023 0458   CREATININE 0.91 10/31/2013 1541   CALCIUM 8.2 (L) 03/06/2023 1025   CALCIUM 9.1 10/31/2013 1541   PROT 5.8 (L) 03/04/2023 0521   PROT 7.2 10/31/2013 1541   ALBUMIN 2.8 (L) 03/04/2023 0521   ALBUMIN 4.0 10/31/2013 1541   AST 18 03/04/2023 0521   AST 14 (L) 10/31/2013 1541   ALT 13 03/04/2023 0521   ALT 20 10/31/2013 1541   ALKPHOS 76 03/04/2023 0521   ALKPHOS 82 10/31/2013 1541   BILITOT 0.3 03/04/2023 0521   BILITOT 0.2 10/31/2013 1541   GFRNONAA >60 03/10/2023 0458   GFRNONAA >60 10/31/2013 1541     No results found.     Assessment & Plan:   1. Atherosclerosis of native artery of both lower extremities with rest pain (HCC) Recommend:  The patient is status post successful angiogram with intervention.  The patient reports that the claudication symptoms and leg pain has improved.   The patient denies lifestyle limiting changes at this point in time.  No further invasive studies, angiography or surgery at this time. The patient should continue walking and begin a more formal exercise program.  The patient should continue antiplatelet therapy and aggressive treatment of the lipid  abnormalities  Continued surveillance is indicated as atherosclerosis is likely to progress with time.    Patient should undergo noninvasive studies as ordered. The patient will follow up with me to review the studies.   2. Essential hypertension Continue antihypertensive medications as already ordered, these medications have been reviewed and there are no changes at this time.  3. Closed fracture of left ankle, initial encounter The patient was concern for possible infection with her ankle wound.  This is currently being treated by podiatry.  Per the patient's request we sent in an antibiotic but she is advised for follow-up to continue with podiatrist if she has additional concerns.   Current Outpatient Medications on File Prior to Visit  Medication Sig Dispense Refill   acetaminophen (TYLENOL) 500 MG tablet Take 2 tablets (1,000 mg total) by mouth every 8 (eight) hours.     albuterol (PROVENTIL HFA;VENTOLIN HFA) 108 (90 Base) MCG/ACT inhaler Inhale into the lungs every 6 (six) hours as needed for wheezing or shortness of breath.     amitriptyline (ELAVIL) 100 MG tablet Take 100 mg  by mouth at bedtime.     aspirin EC 81 MG tablet Take 1 tablet (81 mg total) by mouth daily. 150 tablet 2   atorvastatin (LIPITOR) 40 MG tablet Take 40 mg by mouth daily.     buprenorphine (BUTRANS) 7.5 MCG/HR 1 patch once a week.     clopidogrel (PLAVIX) 75 MG tablet TAKE 1 TABLET BY MOUTH DAILY AT 6AM (Patient taking differently: Take 75 mg by mouth daily.) 30 tablet 6   furosemide (LASIX) 20 MG tablet Take 20 mg by mouth daily.     gabapentin (NEURONTIN) 300 MG capsule Take 2 capsules (600 mg total) by mouth at bedtime. (Patient taking differently: Take 600 mg by mouth 2 (two) times daily. One capsule in the morning, 2 capsules at bedtime) 60 capsule 3   hydrOXYzine (ATARAX) 25 MG tablet TAKE ONE (1) TABLET BY MOUTH TWO TIMES PER DAY AS NEEDED FOR ITCHING     Magnesium 400 MG CAPS Take 1 tablet by mouth  daily.     nitroGLYCERIN (NITROSTAT) 0.4 MG SL tablet Place under the tongue.     oxyCODONE (ROXICODONE) 5 MG immediate release tablet Take 1 tablet (5 mg total) by mouth every 4 (four) hours as needed for severe pain. 20 tablet 0   pregabalin (LYRICA) 150 MG capsule Take 150 mg by mouth 3 (three) times daily.     senna-docusate (SENOKOT-S) 8.6-50 MG tablet Take 2 tablets by mouth at bedtime. 30 tablet 0   TRELEGY ELLIPTA 100-62.5-25 MCG/ACT AEPB Inhale 1 puff into the lungs daily. 1 each 3   vitamin B-12 (CYANOCOBALAMIN) 1000 MCG tablet Take 1,000 mcg by mouth daily.     Vitamin E 268 MG (400 UNIT) CAPS Take 400 Units by mouth daily.     HYDROcodone-acetaminophen (NORCO/VICODIN) 5-325 MG tablet Take 1-2 tablets by mouth every 6 (six) hours as needed for moderate pain. (Patient not taking: Reported on 05/17/2023)     ipratropium-albuterol (DUONEB) 0.5-2.5 (3) MG/3ML SOLN Take 3 mLs by nebulization every 6 (six) hours as needed. 270 mL 0   No current facility-administered medications on file prior to visit.    There are no Patient Instructions on file for this visit. No follow-ups on file.   Georgiana Spinner, NP

## 2023-06-05 DIAGNOSIS — S82892A Other fracture of left lower leg, initial encounter for closed fracture: Secondary | ICD-10-CM | POA: Diagnosis not present

## 2023-06-05 DIAGNOSIS — J449 Chronic obstructive pulmonary disease, unspecified: Secondary | ICD-10-CM | POA: Diagnosis not present

## 2023-06-06 DIAGNOSIS — S82892A Other fracture of left lower leg, initial encounter for closed fracture: Secondary | ICD-10-CM | POA: Diagnosis not present

## 2023-06-06 DIAGNOSIS — J449 Chronic obstructive pulmonary disease, unspecified: Secondary | ICD-10-CM | POA: Diagnosis not present

## 2023-06-09 DIAGNOSIS — J449 Chronic obstructive pulmonary disease, unspecified: Secondary | ICD-10-CM | POA: Diagnosis not present

## 2023-07-06 ENCOUNTER — Encounter (INDEPENDENT_AMBULATORY_CARE_PROVIDER_SITE_OTHER): Payer: Self-pay | Admitting: Nurse Practitioner

## 2023-07-15 ENCOUNTER — Other Ambulatory Visit: Payer: Self-pay | Admitting: Student in an Organized Health Care Education/Training Program

## 2023-08-12 ENCOUNTER — Other Ambulatory Visit (INDEPENDENT_AMBULATORY_CARE_PROVIDER_SITE_OTHER): Payer: Self-pay | Admitting: Nurse Practitioner

## 2023-08-12 DIAGNOSIS — Z9889 Other specified postprocedural states: Secondary | ICD-10-CM

## 2023-08-17 ENCOUNTER — Ambulatory Visit (INDEPENDENT_AMBULATORY_CARE_PROVIDER_SITE_OTHER): Payer: HMO | Admitting: Nurse Practitioner

## 2023-08-17 ENCOUNTER — Encounter (INDEPENDENT_AMBULATORY_CARE_PROVIDER_SITE_OTHER): Payer: HMO

## 2023-08-18 ENCOUNTER — Ambulatory Visit: Payer: HMO | Admitting: Student in an Organized Health Care Education/Training Program

## 2023-08-18 ENCOUNTER — Telehealth: Payer: Self-pay

## 2023-09-27 ENCOUNTER — Encounter: Payer: Self-pay | Admitting: Student in an Organized Health Care Education/Training Program

## 2023-09-27 ENCOUNTER — Ambulatory Visit
Attending: Student in an Organized Health Care Education/Training Program | Admitting: Student in an Organized Health Care Education/Training Program

## 2023-09-27 VITALS — BP 149/81 | HR 82 | Temp 97.5°F | Resp 16 | Ht 60.0 in | Wt 128.0 lb

## 2023-09-27 DIAGNOSIS — G5773 Causalgia of bilateral lower limbs: Secondary | ICD-10-CM | POA: Diagnosis present

## 2023-09-27 DIAGNOSIS — I739 Peripheral vascular disease, unspecified: Secondary | ICD-10-CM

## 2023-09-27 DIAGNOSIS — M792 Neuralgia and neuritis, unspecified: Secondary | ICD-10-CM

## 2023-09-27 DIAGNOSIS — G894 Chronic pain syndrome: Secondary | ICD-10-CM

## 2023-09-27 MED ORDER — BUPRENORPHINE 7.5 MCG/HR TD PTWK: 1.0000 | MEDICATED_PATCH | TRANSDERMAL | 2 refills | Status: DC

## 2023-09-27 NOTE — Patient Instructions (Signed)
 Medication Rules  Applies to: All patients receiving prescriptions (written or electronic).  Pharmacy of record: Pharmacy where electronic prescriptions will be sent. If written prescriptions are taken to a different pharmacy, please inform the nursing staff. The pharmacy listed in the electronic medical record should be the one where you would like electronic prescriptions to be sent.  Prescription refills: Only during scheduled appointments. Applies to both, written and electronic prescriptions.  NOTE: The following applies primarily to controlled substances (Opioid* Pain Medications).   Patient's responsibilities: Pain Pills: Bring all pain pills to every appointment (except for procedure appointments). Pill Bottles: Bring pills in original pharmacy bottle. Always bring newest bottle. Bring bottle, even if empty. Medication refills: You are responsible for knowing and keeping track of what medications you need refilled. The day before your appointment, write a list of all prescriptions that need to be refilled. Bring that list to your appointment and give it to the admitting nurse. Prescriptions will be written only during appointments. If you forget a medication, it will not be "Called in", "Faxed", or "electronically sent". You will need to get another appointment to get these prescribed. Prescription Accuracy: You are responsible for carefully inspecting your prescriptions before leaving our office. Have the discharge nurse carefully go over each prescription with you, before taking them home. Make sure that your name is accurately spelled, that your address is correct. Check the name and dose of your medication to make sure it is accurate. Check the number of pills, and the written instructions to make sure they are clear and accurate. Make sure that you are given enough medication to last until your next medication refill appointment. Taking Medication: Take medication as prescribed. Never  take more pills than instructed. Never take medication more frequently than prescribed. Taking less pills or less frequently is permitted and encouraged, when it comes to controlled substances (written prescriptions).  Inform other Doctors: Always inform, all of your healthcare providers, of all the medications you take. Pain Medication from other Providers: You are not allowed to accept any additional pain medication from any other Doctor or Healthcare provider. There are two exceptions to this rule. (see below) In the event that you require additional pain medication, you are responsible for notifying us, as stated below. Medication Agreement: You are responsible for carefully reading and following our Medication Agreement. This must be signed before receiving any prescriptions from our practice. Safely store a copy of your signed Agreement. Violations to the Agreement will result in no further prescriptions. (Additional copies of our Medication Agreement are available upon request.) Laws, Rules, & Regulations: All patients are expected to follow all [address] and [employer], Rules, [place] [employer]. Ignorance of the Laws does not constitute a valid excuse. The use of any illegal substances is prohibited. Adopted CDC guidelines & recommendations: Target dosing levels will be at or below 60 MME/day. Use of benzodiazepines** is not recommended.  Exceptions: There are only two exceptions to the rule of not receiving pain medications from other Healthcare Providers. Exception #1 (Emergencies): In the event of an emergency (i.e.: accident requiring emergency care), you are allowed to receive additional pain medication. However, you are responsible for: As soon as you are able, call our office [phone], at any time of the day or night, and leave a message stating your name, the date and nature of the emergency, and the name and dose of the medication prescribed. In the event that your call is answered  by a member of  our staff, make sure to document and save the date, time, and the name of the person that took your information.  Exception #2 (Planned Surgery): In the event that you are scheduled by another doctor or dentist to have any type of surgery or procedure, you are allowed (for a period no longer than 30 days), to receive additional pain medication, for the acute post-op pain. However, in this case, you are responsible for picking up a copy of our "Post-op Pain Management for Surgeons" handout, and giving it to your surgeon or dentist. This document is available at our office, and does not require an appointment to obtain it. Simply go to our office during business hours (Monday-Thursday from 8:00 AM to 4:00 PM) (Friday 8:00 AM to 12:00 Noon) or if you have a scheduled appointment with [place], prior to your surgery, and ask for it by name. In addition, you will need to provide [place] with your name, name of your surgeon, type of surgery, and date of procedure or surgery.  *Opioid medications include: morphine, codeine, oxycodone, oxymorphone, hydrocodone, hydromorphone, meperidine, tramadol, tapentadol, buprenorphine, fentanyl, methadone. **Benzodiazepine medications include: diazepam (Valium), alprazolam (Xanax), clonazepam (Klonopine), lorazepam (Ativan), clorazepate (Tranxene), chlordiazepoxide (Librium), estazolam (Prosom), oxazepam (Serax), temazepam (Restoril), triazolam (Halcion) (Last updated: 08/25/2017)    Opioid Overdose Opioids are drugs that are often used to treat pain. Opioids include illegal drugs, such as heroin, as well as prescription pain medicines, such as codeine, morphine, hydrocodone, and fentanyl. An opioid overdose happens when you take too much of an opioid. An overdose may be intentional or accidental and can happen with any type of opioid. The effects of an overdose can be mild, dangerous, or even deadly. Opioid overdose is a medical emergency. What are the causes? This  condition may be caused by: Taking too much of an opioid on purpose. Taking too much of an opioid by accident. Using two or more substances that contain opioids at the same time. Taking an opioid with a substance that affects your heart, breathing, or blood pressure. These include alcohol, tranquilizers, sleeping pills, illegal drugs, and some over-the-counter medicines. This condition may also happen due to an error made by: A health care provider who prescribes a medicine. The pharmacist who fills the prescription. What increases the risk? This condition is more likely in: Children. They may be attracted to colorful pills. Because of a child's small size, even a small amount of a medicine can be dangerous. Older people. They may be taking many different medicines. Older people may have difficulty reading labels or remembering when they last took their medicines. They may also be more sensitive to the effects of opioids. People with chronic medical conditions, especially heart, liver, kidney, or neurological diseases. People who take an opioid for a long period of time. People who take opioids and use illegal drugs, such as heroin, or other substances, such as alcohol. People who: Have a history of drug or alcohol abuse. Have certain mental health conditions. Have a history of previous drug overdoses. People who take opioids that are not prescribed for them. What are the signs or symptoms? Symptoms of this condition depend on the type of opioid and the amount that was taken. Common symptoms include: Sleepiness or difficulty waking from sleep. Confusion. Slurred speech. Slowed breathing and a slow pulse (bradycardia). Nausea and vomiting. Abnormally small pupils. Signs and symptoms that require emergency treatment include: Cold, clammy, and pale skin. Blue lips and fingernails. Vomiting. Gurgling sounds in the throat. A pulse  that is very slow or difficult to detect. Breathing that  is very irregular, slow, noisy, or difficult to detect. Inability to respond to speech or be awakened from sleep (stupor). Seizures. How is this diagnosed? This condition is diagnosed based on your symptoms and medical history. It is important to tell your health care provider: About all of the opioids that you took. When you took the opioids. Whether you were drinking alcohol or using marijuana, cocaine, or other drugs. Your health care provider will do a physical exam. This exam may include: Checking and monitoring your heart rate and rhythm, breathing rate, temperature, and blood pressure. Measuring oxygen levels in your blood. Checking for abnormally small pupils. You may also have blood tests or urine tests. You may have X-rays if you are having severe breathing problems. How is this treated? This condition requires immediate medical treatment and hospitalization. Reversing the effects of the opioid is the first step in treatment. If you have a Narcan kit or naloxone, use it right away. Follow your health care provider's instructions. A friend or family member can also help you with this. The rest of your treatment will be given in the hospital intensive care (ICU). Treatment in the hospital may include: Giving salts and minerals (electrolytes) along with fluids through an IV. Inserting a breathing tube (endotracheal tube) in your airway to help you breathe if you cannot breathe on your own or you are in danger of not being able to breathe on your own. Giving oxygen through a small tube under your nose. Passing a tube through your nose and into your stomach (nasogastric tube, or NG tube) to empty your stomach. Giving medicines that: Increase your blood pressure. Relieve nausea and vomiting. Relieve abdominal pain and cramping. Reverse the effects of the opioid (naloxone). Monitoring your heart and oxygen levels. Ongoing counseling and mental health support if you intentionally  overdosed or used an illegal drug. Follow these instructions at home:  Medicines Take over-the-counter and prescription medicines only as told by your health care provider. Always ask your health care provider about possible side effects and interactions of any new medicine that you start taking. Keep a list of all the medicines that you take, including over-the-counter medicines. Bring this list with you to all your medical visits. General instructions Drink enough fluid to keep your urine pale yellow. Keep all follow-up visits. This is important. How is this prevented? Read the drug inserts that come with your opioid pain medicines. Take medicines only as told by your health care provider. Do not take more medicine than you are told. Do not take medicines more frequently than you are told. Do not drink alcohol or take sedatives when taking opioids. Do not use illegal or recreational drugs, including cocaine, ecstasy, and marijuana. Do not take opioid medicines that are not prescribed for you. Store all medicines in safety containers that are out of the reach of children. Get help if you are struggling with: Alcohol or drug use. Depression or another mental health problem. Thoughts of hurting yourself or another person. Keep the phone number of your local poison control center near your phone or in your mobile phone. In the [place] the hotline of the [facility] is 484-616-6758. If you were prescribed naloxone, make sure you understand how to take it. Contact a health care provider if: You need help understanding how to take your pain medicines. You feel your medicines are too strong. You are concerned that your pain  medicines are not working well for your pain. You develop new symptoms or side effects when you are taking medicines. Get help right away if: You or someone else is having symptoms of an opioid overdose. Get help even if you are not sure. You have  thoughts about hurting yourself or others. You have: Chest pain. Difficulty breathing. A loss of consciousness. These symptoms may represent a serious problem that is an emergency. Do not wait to see if the symptoms will go away. Get medical help right away. Call your local emergency services (911 in the [place] Do not drive yourself to the hospital. If you ever feel like you may hurt yourself or others, or have thoughts about taking your own life, get help right away. You can go to your nearest emergency department or: Call your local emergency services (911 in the [place] Call a suicide crisis helpline, such as the National Suicide Prevention Lifeline at (251)870-4774 or 988 in the [place] This is open 24 hours a day in the [place] If you're a Veteran: Call 988 and press 1. This is open 24 hours a day. Text the [employer] at (781)428-7524. Summary Opioids are drugs that are often used to treat pain. Opioids include illegal drugs, such as heroin, as well as prescription pain medicines. An opioid overdose happens when you take too much of an opioid. Overdoses can be intentional or accidental. Opioid overdose is very dangerous. It is a life-threatening emergency. If you or someone you know is experiencing an opioid overdose, get help right away. This information is not intended to replace advice given to you by your health care provider. Make sure you discuss any questions you have with your health care provider. Document Revised: 03/21/2023 Document Reviewed: 09/24/2020 Else[employer] Patient Education  2024 [employer].

## 2023-09-27 NOTE — Progress Notes (Signed)
 Safety precautions to be maintained throughout the outpatient stay will include: orient to surroundings, keep bed in low position, maintain call bell within reach at all times, provide assistance with transfer out of bed and ambulation.

## 2023-09-27 NOTE — Progress Notes (Signed)
 PROVIDER NOTE: Information contained herein reflects review and annotations entered in association with encounter. Interpretation of such information and data should be left to medically-trained personnel. Information provided to patient can be located elsewhere in the medical record under "Patient Instructions". Document created using STT-dictation technology, any transcriptional errors that may result from process are unintentional.    Patient: Wendy Arias  Service Category: E/M  Provider: Edward Jolly, MD  DOB: [birth year]  DOS: 09/27/2023  Referring Provider: Wilford Corner  MRN: [id number]  Specialty: Interventional Pain Management  PCP: Wilford Corner, PA-C  Type: Established Patient  Setting: Ambulatory outpatient    Location: Office  Delivery: Face-to-face     HPI  [name], a 73 y.o. year old female, is here today because of her Chronic pain syndrome [G89.4]. [name]'s primary complain today is Leg Pain (Upper leg ) and Knee Pain  The primary encounter diagnosis was Chronic pain syndrome. 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  Pain Assessment: Severity of Chronic pain is reported as a 7 /10. Location: Leg Anterior, Right, Left/Pain from pelvis down front of thighs bilateral to knee bilateral. Onset: More than a month ago. Quality: Constant, Burning, Pins and needles ("electrical shocks"). Timing: Constant. Modifying factor(s): Pain patches. Vitals:  height is 5' (1.524 m) and weight is 128 lb (58.1 kg). Her temporal temperature is 97.5 F (36.4 C) (abnormal). Her blood pressure is 149/81 (abnormal) and her pulse is 82. Her respiration is 16 and oxygen saturation is 95%.  BMI: Estimated body mass index is 25 kg/m as calculated from the following:   Height as of this  encounter: 5' (1.524 m).   Weight as of this encounter: 128 lb (58.1 kg). Last encounter: 02/08/2023. Last procedure: Visit date not found.  Reason for encounter:   Discussed the use of AI scribe software for clinical note transcription with the patient, who gave verbal consent to proceed.  History of Present Illness   Wendy Arias is a 73 year old female who presents with chronic pain management issues.  She has been experiencing persistent pain that has not improved since discontinuing her pain patch, which was previously effective in managing her pain. She was on a dose of 5, which was increased to 7.5 before stopping it several months ago. She is considering resuming the patch at the 7.5 dose to improve her quality of life, acknowledging that some level of pain is expected.  She suffered a left ankle fracture prior to March 11, 2023, which required hospitalization and surgery. Her recovery has been generally good, with satisfactory range of motion, although she notes some tightness during flexion and extension and a small 'hole' at the surgical site. She has completed her follow-up with the podiatrist.  She also experienced a COVID pneumonia infection following her ankle surgery, which contributed to her discontinuation of the pain patch.  No diabetes.         ROS  Constitutional: Denies any fever or chills Gastrointestinal: No reported hemesis, hematochezia, vomiting, or acute GI distress Musculoskeletal: Denies any acute onset joint swelling, redness, loss of ROM, or weakness Neurological:  left ankle pain  Medication Review  Fluticasone-Umeclidin-Vilant, Magnesium, Vitamin E, acetaminophen, albuterol, amitriptyline, aspirin EC, atorvastatin, buprenorphine, clopidogrel, cyanocobalamin, furosemide, gabapentin, ipratropium-albuterol, and nitroGLYCERIN  History Review  Allergy: Wendy Arias is  allergic to penicillins. Drug: Wendy Arias  reports no history of drug use. Alcohol:   reports no history of alcohol use. Tobacco:  reports that she has been smoking cigarettes. She has a 10 pack-year smoking history. She has never used smokeless tobacco. Social: Wendy Arias  reports that she has been smoking cigarettes. She has a 10 pack-year smoking history. She has never used smokeless tobacco. She reports that she does not drink alcohol and does not use drugs. 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, COPD (chronic obstructive pulmonary disease) (HCC), Depression, Diverticulosis, DOE (dyspnea on exertion), Dysphagia, Empyema of right pleural space (HCC) (02/16/2018), Hepatic cirrhosis (HCC), History of 2019 novel coronavirus disease (COVID-19) (03/03/2023), HLD (hyperlipidemia), Hypertension, IDA (iron deficiency anemia), Infrarenal abdominal aortic aneurysm (AAA) without rupture (HCC) (04/11/2018), Ischemic cardiomyopathy, Long term current use of aspirin, Long term current use of clopidogrel, NSTEMI (non-ST elevated myocardial infarction) (HCC) (06/05/2004), Osteopenia, Osteoporosis, Peripheral vascular disease (HCC), Pneumonia, and PONV (postoperative nausea and vomiting). Surgical: Wendy Arias  has a past surgical history that includes Reduction mammaplasty; Colonoscopy with propofol (N/A, 04/12/2017); Partial hysterectomy (N/A); Coronary angioplasty with stent (Left, 06/08/2004); Endobronchial ultrasound (N/A, 11/28/2017); Lower Extremity Angiography (Right, 08/12/2021); Video assisted thoracoscopy (vats)/thorocotomy (Right, 03/2018); Endarterectomy femoral (Bilateral, 09/16/2021); Insertion of iliac stent (Bilateral, 09/16/2021); Lower Extremity Angiography (Left,  03/07/2023); and ORIF ankle fracture (Left, 03/11/2023). Family: family history includes Breast cancer (age of onset: 56) in an other family member; Breast cancer (age of onset: 31) in her maternal aunt and mother; Dementia in her mother; Diabetes in her father and sister; Heart disease in her father; Hypertension in her father and mother.  Laboratory Chemistry Profile   Renal Lab Results  Component Value Date   BUN 29 (H) 03/07/2023   CREATININE 0.69 03/10/2023   GFRAA >60 04/13/2018   GFRNONAA >60 03/10/2023    Hepatic Lab Results  Component Value Date   AST 18 03/04/2023   ALT 13 03/04/2023   ALBUMIN 2.8 (L) 03/04/2023   ALKPHOS 76 03/04/2023   LIPASE 23 02/16/2018   AMMONIA 36 (H) 07/13/2022    Electrolytes Lab Results  Component Value Date   NA 135 03/06/2023   K 4.3 03/06/2023   CL 102 03/06/2023   CALCIUM 8.2 (L) 03/06/2023   MG 2.3 02/03/2023    Bone Lab Results  Component Value Date   VD25OH 15.5 (L) 08/12/2015    Inflammation (CRP: Acute Phase) (ESR: Chronic Phase) Lab Results  Component Value Date   LATICACIDVEN 1.1 03/03/2023         Note: Above Lab results reviewed.  Recent Imaging Review  VAS [place] LOWER EXTREMITY ARTERIAL DUPLEX LOWER EXTREMITY ARTERIAL DUPLEX STUDY  Patient Name:  Wendy Arias  Date of Exam:   05/17/2023 Medical Rec #: [id number]     Accession #:    [id number] Date of Birth: 1951-06-26     Patient Gender: F Patient Age:   49 years Exam Location:  [facility] Procedure:      VAS [place] LOWER EXTREMITY ARTERIAL DUPLEX Referring Phys: Sheppard Plumber  --------------------------------------------------------------------------------   Indications: Peripheral artery disease.  High Risk Factors: Past history of smoking, coronary artery disease.   Vascular Interventions: 09/16/2021 Bilat FEA, Bilat CIA EIA stents  03/07/2023 Left SFA stent, thrombectomy sfa/ pop/ tib                         per. Current  ABI:            Not performed today  Performing Technologist: Hardie Lora RVT    Examination Guidelines: A complete evaluation includes B-mode imaging, spectral Doppler, color Doppler, and power Doppler as needed of all accessible portions of each vessel. Bilateral testing is considered an integral part of a complete examination. Limited examinations for reoccurring indications may be performed as noted.        +----------+--------+-----+--------+----------+--------+ LEFT      PSV cm/sRatioStenosisWaveform  Comments +----------+--------+-----+--------+----------+--------+ CIA [id number]                  biphasic           +----------+--------+-----+--------+----------+--------+ EIA Distal83                   biphasic           +----------+--------+-----+--------+----------+--------+ CFA Distal156                  biphasic           +----------+--------+-----+--------+----------+--------+ DFA       64                   triphasic          +----------+--------+-----+--------+----------+--------+ SFA Prox  151                  biphasic           +----------+--------+-----+--------+----------+--------+ SFA Mid   153                  biphasic  stent    +----------+--------+-----+--------+----------+--------+ SFA Distal104                  monophasic         +----------+--------+-----+--------+----------+--------+ POP Prox  90                   monophasic         +----------+--------+-----+--------+----------+--------+ POP Distal102                  monophasic         +----------+--------+-----+--------+----------+--------+ TP Trunk  133                  monophasic         +----------+--------+-----+--------+----------+--------+ ATA Distal67                   monophasic         +----------+--------+-----+--------+----------+--------+ PTA Distal81                   monophasic          +----------+--------+-----+--------+----------+--------+  Patent SFA stent. No evidence of focal stenosis.   Left Stent(s): +--------------+--------+--------+----------+--------+ Mid SFA       PSV cm/sStenosisWaveform  Comments +--------------+--------+--------+----------+--------+ Prox to Stent 165             monophasic         +--------------+--------+--------+----------+--------+ Proximal [id number]             monophasic         +--------------+--------+--------+----------+--------+ Mid Stent     108             monophasic         +--------------+--------+--------+----------+--------+  Distal Stent  86              monophasic         +--------------+--------+--------+----------+--------+           Summary: Left: No focal stenosis identified.    See table(s) above for measurements and observations.  Electronically signed by Festus Barren MD on 05/24/2023 at 7:19:38 AM.       Final   Note: Reviewed        Physical Exam  General appearance: Well nourished, well developed, and well hydrated. In no apparent acute distress Mental status: Alert, oriented x 3 (person, place, & time)       Respiratory: No evidence of acute respiratory distress Eyes: PERLA Vitals: BP (!) 149/81 (Patient Position: Sitting, Cuff Size: Normal)   Pulse 82   Temp (!) 97.5 F (36.4 C) (Temporal)   Resp 16   Ht 5' (1.524 m)   Wt 128 lb (58.1 kg)   SpO2 95%   BMI 25.00 kg/m  BMI: Estimated body mass index is 25 kg/m as calculated from the following:   Height as of this encounter: 5' (1.524 m).   Weight as of this encounter: 128 lb (58.1 kg). Ideal: Ideal body weight: 45.5 kg (100 lb 4.9 oz) Adjusted ideal body weight: 50.5 kg (111 lb 6.2 oz)  Physical Exam   MUSCULOSKELETAL: Ankle rotation and flexion-extension intact.      Assessment   Diagnosis Status  1. Chronic pain syndrome   2. Neuropathic pain of lower extremity   3. Peripheral vascular disease  (HCC) (hx of femoral endarterectomy September 17 2021)   4. 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  Plan of Care  Problem-specific:  Assessment and Plan    Chronic neuropathic pain   Chronic neuropathic pain persists as a long-term condition. Previously managed with a pain patch, she stopped using it due to a left ankle fracture and surgery. The goal is to reduce pain to improve daily functioning and quality of life, acknowledging complete relief is unlikely. Restart the pain patch at 7.5 mg with two refills. Evaluate pain control in 2-3 months to consider increasing the dose to 10 mg if necessary.  Left ankle fracture post-surgery   Post-surgical healing of the left ankle fracture continues with residual issues, including a small hole at the surgical site. She has completed follow-up with the podiatrist and exhibits good range of motion, though some tightness remains.       [name] has a current medication list which includes the following long-term medication(s): albuterol, amitriptyline, atorvastatin, gabapentin, ipratropium-albuterol, and nitroglycerin.  Pharmacotherapy (Medications Ordered): Meds ordered this encounter  Medications   buprenorphine (BUTRANS) 7.5 MCG/HR    Sig: Place 1 patch onto the skin once a week.    Dispense:  4 patch    Refill:  2    Chronic Pain: STOP Act (Not applicable) Fill 1 day early if closed on refill date. Avoid benzodiazepines within 8 hours of opioids   Orders:  No orders of the defined types were placed in this encounter.  Follow-up plan:   Return in about 3 months (around 12/27/2023) for MM, F2F.     Recent Visits No visits were found meeting these conditions. Showing recent visits within past 90 days and meeting all other requirements Today's Visits Date Type Provider Dept  09/27/23 Office Visit Edward Jolly, MD [facility]  Showing today's visits and meeting all other requirements Future Appointments Date Type Provider Dept  12/22/23 Appointment Edward Jolly, MD [facility]  Showing future appointments within next 90 days and meeting all other requirements  I discussed the assessment and treatment plan with the patient. The patient was provided an opportunity to ask questions and all were answered. The patient agreed with the plan and demonstrated an understanding of the instructions.  Patient advised to call back or seek an in-person evaluation if the symptoms or condition worsens.  Duration of encounter: .  Total time on encounter, as per AMA guidelines included both the face-to-face and non-face-to-face time personally spent by the physician and/or other qualified health care professional(s) on the day of the encounter (includes time in activities that require the physician or other qualified health care professional and does not include time in activities normally performed by clinical staff). Physician's time may include the following activities when performed: Preparing to see the patient (e.g., pre-charting review of records, searching for previously ordered imaging, lab work, and nerve conduction tests) Review of prior analgesic pharmacotherapies. Reviewing PMP Interpreting ordered tests (e.g., lab work, imaging, nerve conduction tests) Performing post-procedure evaluations, including interpretation of diagnostic procedures Obtaining and/or reviewing separately obtained history Performing a medically appropriate examination and/or evaluation Counseling and educating the patient/family/caregiver Ordering medications, tests, or procedures Referring and communicating with other health care professionals (when not separately reported) Documenting clinical information in the electronic or other health record Independently interpreting results (not separately reported) and  communicating results to the patient/ family/caregiver Care coordination (not separately reported)  Note by: Edward Jolly, MD Date: 09/27/2023; Time: 11:53 AM

## 2023-10-01 ENCOUNTER — Inpatient Hospital Stay
Admission: EM | Admit: 2023-10-01 | Discharge: 2023-10-06 | DRG: 190 | Disposition: A | Discharge status: Home / Self Care | Attending: Hospitalist | Admitting: Hospitalist

## 2023-10-01 ENCOUNTER — Other Ambulatory Visit: Payer: Self-pay

## 2023-10-01 ENCOUNTER — Observation Stay

## 2023-10-01 ENCOUNTER — Emergency Department

## 2023-10-01 DIAGNOSIS — K7460 Unspecified cirrhosis of liver: Secondary | ICD-10-CM | POA: Diagnosis present

## 2023-10-01 DIAGNOSIS — Z955 Presence of coronary angioplasty implant and graft: Secondary | ICD-10-CM

## 2023-10-01 DIAGNOSIS — I7143 Infrarenal abdominal aortic aneurysm, without rupture: Secondary | ICD-10-CM | POA: Diagnosis present

## 2023-10-01 DIAGNOSIS — F17210 Nicotine dependence, cigarettes, uncomplicated: Secondary | ICD-10-CM | POA: Diagnosis present

## 2023-10-01 DIAGNOSIS — D638 Anemia in other chronic diseases classified elsewhere: Secondary | ICD-10-CM | POA: Diagnosis present

## 2023-10-01 DIAGNOSIS — J441 Chronic obstructive pulmonary disease with (acute) exacerbation: Secondary | ICD-10-CM | POA: Diagnosis not present

## 2023-10-01 DIAGNOSIS — F419 Anxiety disorder, unspecified: Secondary | ICD-10-CM | POA: Diagnosis present

## 2023-10-01 DIAGNOSIS — I2510 Atherosclerotic heart disease of native coronary artery without angina pectoris: Secondary | ICD-10-CM | POA: Diagnosis present

## 2023-10-01 DIAGNOSIS — I739 Peripheral vascular disease, unspecified: Secondary | ICD-10-CM | POA: Diagnosis present

## 2023-10-01 DIAGNOSIS — Z8249 Family history of ischemic heart disease and other diseases of the circulatory system: Secondary | ICD-10-CM

## 2023-10-01 DIAGNOSIS — G894 Chronic pain syndrome: Secondary | ICD-10-CM | POA: Diagnosis present

## 2023-10-01 DIAGNOSIS — J439 Emphysema, unspecified: Secondary | ICD-10-CM | POA: Diagnosis present

## 2023-10-01 DIAGNOSIS — Z8616 Personal history of COVID-19: Secondary | ICD-10-CM

## 2023-10-01 DIAGNOSIS — J449 Chronic obstructive pulmonary disease, unspecified: Secondary | ICD-10-CM | POA: Diagnosis present

## 2023-10-01 DIAGNOSIS — Z66 Do not resuscitate: Secondary | ICD-10-CM | POA: Diagnosis present

## 2023-10-01 DIAGNOSIS — B3781 Candidal esophagitis: Secondary | ICD-10-CM | POA: Diagnosis present

## 2023-10-01 DIAGNOSIS — R933 Abnormal findings on diagnostic imaging of other parts of digestive tract: Secondary | ICD-10-CM

## 2023-10-01 DIAGNOSIS — J9601 Acute respiratory failure with hypoxia: Principal | ICD-10-CM | POA: Diagnosis present

## 2023-10-01 DIAGNOSIS — Z716 Tobacco abuse counseling: Secondary | ICD-10-CM

## 2023-10-01 DIAGNOSIS — R0602 Shortness of breath: Secondary | ICD-10-CM | POA: Diagnosis not present

## 2023-10-01 DIAGNOSIS — Z1152 Encounter for screening for COVID-19: Secondary | ICD-10-CM

## 2023-10-01 DIAGNOSIS — M4854XA Collapsed vertebra, not elsewhere classified, thoracic region, initial encounter for fracture: Secondary | ICD-10-CM | POA: Diagnosis present

## 2023-10-01 DIAGNOSIS — K0889 Other specified disorders of teeth and supporting structures: Secondary | ICD-10-CM | POA: Diagnosis present

## 2023-10-01 DIAGNOSIS — I255 Ischemic cardiomyopathy: Secondary | ICD-10-CM | POA: Diagnosis present

## 2023-10-01 DIAGNOSIS — F17290 Nicotine dependence, other tobacco product, uncomplicated: Secondary | ICD-10-CM | POA: Diagnosis present

## 2023-10-01 DIAGNOSIS — I5032 Chronic diastolic (congestive) heart failure: Secondary | ICD-10-CM | POA: Diagnosis present

## 2023-10-01 DIAGNOSIS — K229 Disease of esophagus, unspecified: Secondary | ICD-10-CM | POA: Diagnosis present

## 2023-10-01 DIAGNOSIS — Z8701 Personal history of pneumonia (recurrent): Secondary | ICD-10-CM

## 2023-10-01 DIAGNOSIS — F32A Depression, unspecified: Secondary | ICD-10-CM | POA: Diagnosis present

## 2023-10-01 DIAGNOSIS — G8929 Other chronic pain: Secondary | ICD-10-CM

## 2023-10-01 DIAGNOSIS — Z7951 Long term (current) use of inhaled steroids: Secondary | ICD-10-CM

## 2023-10-01 DIAGNOSIS — Z7982 Long term (current) use of aspirin: Secondary | ICD-10-CM

## 2023-10-01 DIAGNOSIS — F1120 Opioid dependence, uncomplicated: Secondary | ICD-10-CM | POA: Diagnosis present

## 2023-10-01 DIAGNOSIS — R000 Tachycardia, unspecified: Secondary | ICD-10-CM | POA: Diagnosis present

## 2023-10-01 DIAGNOSIS — E785 Hyperlipidemia, unspecified: Secondary | ICD-10-CM | POA: Diagnosis present

## 2023-10-01 DIAGNOSIS — M4856XA Collapsed vertebra, not elsewhere classified, lumbar region, initial encounter for fracture: Secondary | ICD-10-CM | POA: Diagnosis present

## 2023-10-01 DIAGNOSIS — M810 Age-related osteoporosis without current pathological fracture: Secondary | ICD-10-CM | POA: Diagnosis present

## 2023-10-01 DIAGNOSIS — Z720 Tobacco use: Secondary | ICD-10-CM | POA: Diagnosis present

## 2023-10-01 DIAGNOSIS — I252 Old myocardial infarction: Secondary | ICD-10-CM

## 2023-10-01 DIAGNOSIS — R1310 Dysphagia, unspecified: Secondary | ICD-10-CM

## 2023-10-01 DIAGNOSIS — Z7902 Long term (current) use of antithrombotics/antiplatelets: Secondary | ICD-10-CM

## 2023-10-01 DIAGNOSIS — K2289 Other specified disease of esophagus: Secondary | ICD-10-CM

## 2023-10-01 DIAGNOSIS — Z79899 Other long term (current) drug therapy: Secondary | ICD-10-CM

## 2023-10-01 DIAGNOSIS — Z880 Allergy status to penicillin: Secondary | ICD-10-CM

## 2023-10-01 DIAGNOSIS — I110 Hypertensive heart disease with heart failure: Secondary | ICD-10-CM | POA: Diagnosis present

## 2023-10-01 LAB — EXPECTORATED SPUTUM ASSESSMENT W GRAM STAIN, RFLX TO RESP C

## 2023-10-01 LAB — RESP PANEL BY RT-PCR (RSV, FLU A&B, COVID)  RVPGX2
Influenza A by PCR: NEGATIVE
Influenza B by PCR: NEGATIVE
Resp Syncytial Virus by PCR: NEGATIVE
SARS Coronavirus 2 by RT PCR: NEGATIVE

## 2023-10-01 LAB — RESPIRATORY PANEL BY PCR

## 2023-10-01 LAB — CBC WITH DIFFERENTIAL/PLATELET
Abs Immature Granulocytes: 0.02 10*3/uL (ref 0.00–0.07)
Basophils Absolute: 0.1 10*3/uL (ref 0.0–0.1)
Basophils Relative: 1 %
Eosinophils Absolute: 0.1 10*3/uL (ref 0.0–0.5)
Eosinophils Relative: 1 %
HCT: 42 % (ref 36.0–46.0)
Hemoglobin: 12.9 g/dL (ref 12.0–15.0)
Immature Granulocytes: 0 %
Lymphocytes Relative: 18 %
Lymphs Abs: 1.2 10*3/uL (ref 0.7–4.0)
MCH: 27.3 pg (ref 26.0–34.0)
MCHC: 30.7 g/dL (ref 30.0–36.0)
MCV: 88.8 fL (ref 80.0–100.0)
Monocytes Absolute: 0.7 10*3/uL (ref 0.1–1.0)
Monocytes Relative: 10 %
Neutro Abs: 4.8 10*3/uL (ref 1.7–7.7)
Neutrophils Relative %: 70 %
Platelets: 267 10*3/uL (ref 150–400)
RBC: 4.73 MIL/uL (ref 3.87–5.11)
RDW: 23 % — ABNORMAL HIGH (ref 11.5–15.5)
Smear Review: NORMAL
WBC: 6.8 10*3/uL (ref 4.0–10.5)
nRBC: 0 % (ref 0.0–0.2)

## 2023-10-01 LAB — COMPREHENSIVE METABOLIC PANEL WITH GFR
ALT: 14 U/L (ref 0–44)
AST: 21 U/L (ref 15–41)
Albumin: 4 g/dL (ref 3.5–5.0)
Alkaline Phosphatase: 120 U/L (ref 38–126)
Anion gap: 10 (ref 5–15)
BUN: 19 mg/dL (ref 8–23)
CO2: 26 mmol/L (ref 22–32)
Calcium: 9 mg/dL (ref 8.9–10.3)
Chloride: 102 mmol/L (ref 98–111)
Creatinine, Ser: 1 mg/dL (ref 0.44–1.00)
GFR, Estimated: 59 mL/min — ABNORMAL LOW (ref >60)
Glucose, Bld: 91 mg/dL (ref 70–99)
Potassium: 4.3 mmol/L (ref 3.5–5.1)
Sodium: 138 mmol/L (ref 135–145)
Total Bilirubin: 0.7 mg/dL (ref 0.0–1.2)
Total Protein: 7.2 g/dL (ref 6.5–8.1)

## 2023-10-01 LAB — D-DIMER, QUANTITATIVE: D-Dimer, Quant: 1.53 ug{FEU}/mL — ABNORMAL HIGH (ref 0.00–0.50)

## 2023-10-01 LAB — LIPASE, BLOOD: Lipase: 19 U/L (ref 11–51)

## 2023-10-01 LAB — TROPONIN I (HIGH SENSITIVITY)
Troponin I (High Sensitivity): 7 ng/L (ref <18)
Troponin I (High Sensitivity): 7 ng/L (ref <18)

## 2023-10-01 LAB — BRAIN NATRIURETIC PEPTIDE: B Natriuretic Peptide: 138.4 pg/mL — ABNORMAL HIGH (ref 0.0–100.0)

## 2023-10-01 MED ORDER — ALBUTEROL SULFATE (2.5 MG/3ML) 0.083% IN NEBU: 2.5000 mg | INHALATION_SOLUTION | Freq: Four times a day (QID) | RESPIRATORY_TRACT | Status: DC | PRN

## 2023-10-01 MED ORDER — ONDANSETRON HCL 4 MG PO TABS
4.0000 mg | ORAL_TABLET | Freq: Four times a day (QID) | ORAL | Status: DC | PRN
  Administered 2023-10-02: 4 mg via ORAL
  Filled 2023-10-01: qty 1

## 2023-10-01 MED ORDER — ONDANSETRON HCL 4 MG/2ML IJ SOLN
4.0000 mg | Freq: Once | INTRAMUSCULAR | Status: AC
  Administered 2023-10-01: 4 mg via INTRAVENOUS
  Filled 2023-10-01: qty 2

## 2023-10-01 MED ORDER — PREDNISONE 20 MG PO TABS
40.0000 mg | ORAL_TABLET | Freq: Every day | ORAL | Status: AC
  Administered 2023-10-02 – 2023-10-05 (×4): 40 mg via ORAL
  Filled 2023-10-01 (×4): qty 2

## 2023-10-01 MED ORDER — CLOPIDOGREL BISULFATE 75 MG PO TABS
75.0000 mg | ORAL_TABLET | Freq: Every day | ORAL | Status: DC
  Administered 2023-10-02: 75 mg via ORAL
  Filled 2023-10-01: qty 1

## 2023-10-01 MED ORDER — GUAIFENESIN ER 600 MG PO TB12
600.0000 mg | ORAL_TABLET | Freq: Two times a day (BID) | ORAL | Status: DC
  Administered 2023-10-01 – 2023-10-05 (×10): 600 mg via ORAL
  Filled 2023-10-01 (×10): qty 1

## 2023-10-01 MED ORDER — FUROSEMIDE 20 MG PO TABS
20.0000 mg | ORAL_TABLET | Freq: Every day | ORAL | Status: DC
  Administered 2023-10-01 – 2023-10-05 (×5): 20 mg via ORAL
  Filled 2023-10-01 (×5): qty 1

## 2023-10-01 MED ORDER — BENZONATATE 100 MG PO CAPS
100.0000 mg | ORAL_CAPSULE | Freq: Three times a day (TID) | ORAL | Status: DC | PRN
  Administered 2023-10-02 – 2023-10-03 (×2): 100 mg via ORAL
  Filled 2023-10-01 (×2): qty 1

## 2023-10-01 MED ORDER — UMECLIDINIUM BROMIDE 62.5 MCG/ACT IN AEPB: 1.0000 | INHALATION_SPRAY | Freq: Every day | RESPIRATORY_TRACT | Status: DC

## 2023-10-01 MED ORDER — SENNOSIDES-DOCUSATE SODIUM 8.6-50 MG PO TABS: 1.0000 | ORAL_TABLET | Freq: Every evening | ORAL | Status: DC | PRN

## 2023-10-01 MED ORDER — IPRATROPIUM-ALBUTEROL 0.5-2.5 (3) MG/3ML IN SOLN
3.0000 mL | RESPIRATORY_TRACT | Status: DC
  Administered 2023-10-01 – 2023-10-04 (×14): 3 mL via RESPIRATORY_TRACT
  Filled 2023-10-01 (×12): qty 3
  Filled 2023-10-01: qty 6
  Filled 2023-10-01: qty 3

## 2023-10-01 MED ORDER — FLUTICASONE FUROATE-VILANTEROL 100-25 MCG/ACT IN AEPB
1.0000 | INHALATION_SPRAY | Freq: Every day | RESPIRATORY_TRACT | Status: DC
  Administered 2023-10-02 – 2023-10-06 (×5): 1 via RESPIRATORY_TRACT
  Filled 2023-10-01 (×2): qty 28

## 2023-10-01 MED ORDER — AMITRIPTYLINE HCL 25 MG PO TABS
100.0000 mg | ORAL_TABLET | Freq: Every day | ORAL | Status: DC
  Administered 2023-10-01 – 2023-10-05 (×5): 100 mg via ORAL
  Filled 2023-10-01: qty 2
  Filled 2023-10-01 (×4): qty 4

## 2023-10-01 MED ORDER — METHYLPREDNISOLONE SODIUM SUCC 40 MG IJ SOLR
40.0000 mg | Freq: Two times a day (BID) | INTRAMUSCULAR | Status: AC
  Administered 2023-10-01 – 2023-10-02 (×2): 40 mg via INTRAVENOUS
  Filled 2023-10-01 (×2): qty 1

## 2023-10-01 MED ORDER — ATORVASTATIN CALCIUM 20 MG PO TABS
40.0000 mg | ORAL_TABLET | Freq: Every day | ORAL | Status: DC
  Administered 2023-10-02 – 2023-10-05 (×4): 40 mg via ORAL
  Filled 2023-10-01 (×4): qty 2

## 2023-10-01 MED ORDER — GABAPENTIN 300 MG PO CAPS
600.0000 mg | ORAL_CAPSULE | Freq: Every day | ORAL | Status: DC
  Administered 2023-10-01 – 2023-10-05 (×5): 600 mg via ORAL
  Filled 2023-10-01 (×5): qty 2

## 2023-10-01 MED ORDER — IPRATROPIUM-ALBUTEROL 0.5-2.5 (3) MG/3ML IN SOLN
3.0000 mL | Freq: Once | RESPIRATORY_TRACT | Status: AC
  Administered 2023-10-01: 3 mL via RESPIRATORY_TRACT
  Filled 2023-10-01: qty 3

## 2023-10-01 MED ORDER — LIDOCAINE 5 % EX PTCH
1.0000 | MEDICATED_PATCH | CUTANEOUS | Status: DC
  Administered 2023-10-01 – 2023-10-06 (×6): 1 via TRANSDERMAL
  Filled 2023-10-01 (×6): qty 1

## 2023-10-01 MED ORDER — OXYCODONE HCL 5 MG PO TABS
10.0000 mg | ORAL_TABLET | Freq: Four times a day (QID) | ORAL | Status: DC | PRN
  Administered 2023-10-01 – 2023-10-02 (×3): 10 mg via ORAL
  Filled 2023-10-01 (×3): qty 2

## 2023-10-01 MED ORDER — IOHEXOL 350 MG/ML SOLN
75.0000 mL | Freq: Once | INTRAVENOUS | Status: AC | PRN
  Administered 2023-10-01: 75 mL via INTRAVENOUS

## 2023-10-01 MED ORDER — ASPIRIN 81 MG PO TBEC
81.0000 mg | DELAYED_RELEASE_TABLET | Freq: Every day | ORAL | Status: DC
  Administered 2023-10-02: 81 mg via ORAL
  Filled 2023-10-01: qty 1

## 2023-10-01 MED ORDER — ONDANSETRON HCL 4 MG/2ML IJ SOLN
4.0000 mg | Freq: Four times a day (QID) | INTRAMUSCULAR | Status: DC | PRN
Start: 2023-10-01 — End: 2023-10-06
  Administered 2023-10-01: 4 mg via INTRAVENOUS
  Filled 2023-10-01: qty 2

## 2023-10-01 MED ORDER — OXYCODONE HCL 5 MG PO TABS
5.0000 mg | ORAL_TABLET | ORAL | Status: DC | PRN
  Administered 2023-10-01: 5 mg via ORAL
  Filled 2023-10-01: qty 1

## 2023-10-01 MED ORDER — AZITHROMYCIN 500 MG PO TABS
500.0000 mg | ORAL_TABLET | Freq: Every day | ORAL | Status: DC
  Administered 2023-10-01 – 2023-10-02 (×2): 500 mg via ORAL
  Filled 2023-10-01 (×2): qty 1

## 2023-10-01 MED ORDER — HYDROMORPHONE HCL 1 MG/ML IJ SOLN
0.5000 mg | Freq: Once | INTRAMUSCULAR | Status: AC
  Administered 2023-10-01: 0.5 mg via INTRAVENOUS
  Filled 2023-10-01: qty 0.5

## 2023-10-01 MED ORDER — ENOXAPARIN SODIUM 40 MG/0.4ML IJ SOSY
40.0000 mg | PREFILLED_SYRINGE | INTRAMUSCULAR | Status: DC
  Administered 2023-10-01 – 2023-10-06 (×6): 40 mg via SUBCUTANEOUS
  Filled 2023-10-01 (×6): qty 0.4

## 2023-10-01 MED ORDER — ACETAMINOPHEN 500 MG PO TABS
1000.0000 mg | ORAL_TABLET | Freq: Three times a day (TID) | ORAL | Status: DC
  Administered 2023-10-01 – 2023-10-02 (×4): 1000 mg via ORAL
  Filled 2023-10-01 (×4): qty 2

## 2023-10-01 NOTE — Group Note (Deleted)
 Date:  10/01/2023 Time:  9:53 PM  Group Topic/Focus:  Wrap-Up Group:   The focus of this group is to help patients review their daily goal of treatment and discuss progress on daily workbooks.     Participation Level:  {BHH PARTICIPATION WUJWJ:19147}  Participation Quality:  {BHH PARTICIPATION QUALITY:22265}  Affect:  {BHH AFFECT:22266}  Cognitive:  {BHH COGNITIVE:22267}  Insight: {BHH Insight2:20797}  Engagement in Group:  {BHH ENGAGEMENT IN WGNFA:21308}  Modes of Intervention:  {BHH MODES OF INTERVENTION:22269}  Additional Comments:  ***  Maglione,Angell Honse E 10/01/2023, 9:53 PM

## 2023-10-01 NOTE — ED Notes (Signed)
Patient transferred to Xray

## 2023-10-01 NOTE — ED Triage Notes (Signed)
 Coming home. Called out for SOB. Pt was in 80s on room air. Patient does not wear oxygen at home. Patietn gave herself 3 albuterol breathing treatments. EMS gave 1 breathing treatme. Patient has been coughing for the last week and been complaining of left rib pain for the last week. 1 Do-neb with EMS, 125 soulmerol. Hurts when EDP palpates left side. Patient denies falling. Hx of COPD. Patient takes lasix at home.

## 2023-10-01 NOTE — ED Provider Notes (Signed)
 [facility] Provider Note    Event Date/Time   First MD Initiated Contact with Patient 10/01/23 1117     (approximate)   History   Shortness of Breath   HPI  Wendy Arias is a 73 y.o. female with history of COPD still smoking, ischemic cardiomyopathy, hypertension complex regional pain syndrome who comes in with concerns for cough.  Patient has had a cough for about 1 week and then yesterday she developed pain with the coughing.  Patient has reproducible chest wall pain on the left side worse with coughing, movement.  She reports of increasing shortness of breath associated with that.  Patient called EMS and noted to be satting in the 80s got 1 DuoNeb, Solu-Medrol due to concern for wheezing.  Patient reports being compliant with her diuretics and states that she has had no swelling in her legs recently.   Physical Exam   Triage Vital Signs: ED Triage Vitals [10/01/23 1120]  Encounter Vitals Group     BP      Systolic BP Percentile      Diastolic BP Percentile      Pulse Rate 90     Resp (!) 40     Temp      Temp src      SpO2 97 %     Weight      Height      Head Circumference      Peak Flow      Pain Score 10     Pain Loc      Pain Education      Exclude from Growth Chart     Most recent vital signs: Vitals:   10/01/23 1120  Pulse: 90  Resp: (!) 40  SpO2: 97%     General: Awake, no distress.  CV:  Good peripheral perfusion.  Resp:  Normal effort. Slight wheeze noted  Abd:  No distention.  Other:  No swelling in legs/ Pain in the right chest wall with palpation   ED Results / Procedures / Treatments   Labs (all labs ordered are listed, but only abnormal results are displayed) Labs Reviewed  CBC WITH DIFFERENTIAL/PLATELET - Abnormal; Notable for the following components:      Result Value   RDW 23.0 (*)    All other components within normal limits  COMPREHENSIVE METABOLIC PANEL WITH GFR - Abnormal; Notable for the following  components:   GFR, Estimated 59 (*)    All other components within normal limits  BRAIN NATRIURETIC PEPTIDE - Abnormal; Notable for the following components:   B Natriuretic Peptide 138.4 (*)    All other components within normal limits  D-DIMER, QUANTITATIVE - Abnormal; Notable for the following components:   D-Dimer, Quant 1.53 (*)    All other components within normal limits  RESP PANEL BY RT-PCR (RSV, FLU A&B, COVID)  [id number]  RESPIRATORY PANEL BY PCR  EXPECTORATED SPUTUM ASSESSMENT W GRAM STAIN, RFLX TO RESP C  LIPASE, BLOOD  TROPONIN I (HIGH SENSITIVITY)  TROPONIN I (HIGH SENSITIVITY)     EKG  My interpretation of EKG:  Sinus tachycardia rate of 94 without any ST elevation or T wave inversions, normal intervals  RADIOLOGY I have reviewed the xray personally and interpreted no evidence of any pneumonia   PROCEDURES:  Critical Care performed: Yes, see critical care procedure note(s)  .Critical Care  Performed by: Concha Se, MD Authorized by: Concha Se, MD   Critical care provider statement:  Critical care time (minutes):  30   Critical care was necessary to treat or prevent imminent or life-threatening deterioration of the following conditions:  Respiratory failure   Critical care was time spent personally by me on the following activities:  Development of treatment plan with patient or surrogate, discussions with consultants, evaluation of patient's response to treatment, examination of patient, ordering and review of laboratory studies, ordering and review of radiographic studies, ordering and performing treatments and interventions, pulse oximetry, re-evaluation of patient's condition and review of old charts .1-3 Lead EKG Interpretation  Performed by: Concha Se, MD Authorized by: Concha Se, MD     Interpretation: normal     ECG rate:  90   ECG rate assessment: normal     Rhythm: sinus rhythm     Ectopy: none     Conduction: normal       MEDICATIONS ORDERED IN ED: Medications  HYDROmorphone (DILAUDID) injection 0.5 mg (has no administration in time range)  ondansetron (ZOFRAN) injection 4 mg (has no administration in time range)  ipratropium-albuterol (DUONEB) 0.5-2.5 (3) MG/3ML nebulizer solution 3 mL (has no administration in time range)  ipratropium-albuterol (DUONEB) 0.5-2.5 (3) MG/3ML nebulizer solution 3 mL (has no administration in time range)     IMPRESSION / MDM / ASSESSMENT AND PLAN / ED COURSE  I reviewed the triage vital signs and the nursing notes.   Patient's presentation is most consistent with acute presentation with potential threat to life or bodily function.   Differential includes COPD, COVID, flu, pneumonia, patient treated with DuoNebs already received steroids.  Patient hypoxic requiring 4 L no significant work of breathing to suggest hypercapnia requiring BiPAP.  She has significant pain on her left chest wall that seems reproducible I suspect from all the coughing she strained a muscle.  Her chest x-ray was negative COVID and flu are negative CBC reassuring BMP reassuring troponin negative.  I will add on D-dimer to ensure no evidence of PE.  Did discuss the hospitalist so they are aware for follow-up of this.  Although she has no symptoms of DVT.  The patient is on the cardiac monitor to evaluate for evidence of arrhythmia and/or significant heart rate changes.      FINAL CLINICAL IMPRESSION(S) / ED DIAGNOSES   Final diagnoses:  COPD exacerbation (HCC)  Acute respiratory failure with hypoxia (HCC)     Rx / DC Orders   ED Discharge Orders     None        Note:  This document was prepared using Dragon voice recognition software and may include unintentional dictation errors.   Concha Se, MD 10/01/23 [phone]

## 2023-10-01 NOTE — H&P (Signed)
 History and Physical    Wendy Arias NWG:[id number] DOB: [birth year] DOA: 10/01/2023  PCP: Wilford Corner, PA-C (Confirm with patient/family/NH records and if not entered, this has to be entered at [facility] point of entry) Patient coming from: Home  I have personally briefly reviewed patient's old medical records in [facility]  Chief Complaint: Cough, SOB, back pain, rib cage pain  HPI: Wendy Arias is a 73 y.o. female with medical history significant of COPD Gold stage II, chronic HFpEF, cigarette smoking, CAD status post stenting, chronic pain syndrome, chronic narcotic dependence, presented with worsening of shortness of breath and back pain.  Symptoms started 3 days ago, patient started to have productive cough with yellow-grayish thick sputum production congestion, cough and wheezing and increasing exertional dyspnea.  Since yesterday, patient developed severe back pain, radiating to left-sided chest wall and rib cage, worsening with cough and deep breath.  She denied any fever chills no peripheral edema.  She claimed that " smoke occasionally". ED Course: Afebrile, no tachycardia nonhypotensive, positive for tachypnea and hypoxic O2 saturation 80% on room air and placed on 4 L 93%.  Chest x-ray showed emphysema like changes and new onset of T6 compression fracture and chronic L1 compression fracture.  Patient was given breathing treatment x 3 but continued to experience dyspnea.  Review of Systems: As per HPI otherwise 14 point review of systems negative.    Past Medical History:  Diagnosis Date   (HFpEF) heart failure with preserved ejection fraction (HCC) 06/07/2004   a.) TTE 06/07/2004: EF 50-55%; inf HK. b.) TTE 04/17/2018: EF 45%; GLS -14.6%; inferoseptal and inferoposterior HK; sev LA enlargement; mild PR, mod TR, sev MR; G2DD. c.) TTE 12/05/2018: EF >55%, mild LVH; mild MR. d.) TTE 12/18/2020: EF >55%; mild LA enlargement; triv TR/PR, mod MR; G1DD; e.) TTE 07/13/2022: EF  55-60%, no RWMAs, mild MR;  f.) TTE 02/02/2023: EF 60-65%, no RMWas, triv MR, G1DD   Acute metabolic encephalopathy    Acute respiratory failure with hypoxia (HCC)    Anemia of chronic disease    Anxiety    Aortic atherosclerosis (HCC)    Asthma    CAD (coronary artery disease) 06/08/2004   a.) NSTEMI 06/05/2004. b.) LHC 06/08/2004: EF 70%; 50% mLAD, 50% dLAD, 99% OM3, 50% pRCA, 50% mRCA; PCI to OM3 placing a DES x 1 (unknown type). c.) Lexi scan 09/14/2021: normal LV function with no ischemia or scar.   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    a.) followed by pain management; on transdermal buprenorphine   COPD (chronic obstructive pulmonary disease) (HCC)    Depression    Diverticulosis    DOE (dyspnea on exertion)    Dysphagia    Empyema of right pleural space (HCC) 02/16/2018   Hepatic cirrhosis (HCC)    History of 2019 novel coronavirus disease (COVID-19) 03/03/2023   HLD (hyperlipidemia)    Hypertension    IDA (iron deficiency anemia)    Infrarenal abdominal aortic aneurysm (AAA) without rupture (HCC) 04/11/2018   a.) CT AP 04/11/2018: 2.5 cm; b.) CT AP 01/05/2022: 2.9 cm; c.) CTA chest 03/03/2023: 2.9 cm   Ischemic cardiomyopathy    a.) TTE 06/07/2004: 50-55%; b.) TTE 04/17/2018: EF 45%; c.) TTE 12/05/2018: >55%; d.) TTE 12/18/2020: >55%; e.) MV 09/14/2021: EF 64%; f.) TTE 07/13/2022: EF 55-60%; g.) TTE 02/02/2023: 60-65%   Long term current use of aspirin    Long term current use of clopidogrel  NSTEMI (non-ST elevated myocardial infarction) (HCC) 06/05/2004   a.)  PCI on 06/08/2004 placing a DES x1 (unknown type) to 99% lesion in OM3.   Osteopenia    Osteoporosis    Peripheral vascular disease (HCC)    a.) s/p BILATERAL femoral endarterectomies and open angioplasty with BILATERAL EIA stenting 09/16/2021   Pneumonia    PONV (postoperative nausea and vomiting)     Past Surgical History:  Procedure Laterality Date   COLONOSCOPY WITH PROPOFOL  N/A 04/12/2017   Procedure: COLONOSCOPY WITH PROPOFOL;  Surgeon: Christena Deem, MD;  Location: [facility] ENDOSCOPY;  Service: Endoscopy;  Laterality: N/A;   CORONARY ANGIOPLASTY WITH STENT PLACEMENT Left 06/08/2004   Procedure: CORONARY ANGIOPLASTY WITH STENT PLACEMENT; Location: [facility]; Surgeon: Rudean Hitt, MD   ENDARTERECTOMY FEMORAL Bilateral 09/16/2021   Procedure: ENDARTERECTOMY FEMORAL;  Surgeon: Annice Needy, MD;  Location: [facility] ORS;  Service: Vascular;  Laterality: Bilateral;   ENDOBRONCHIAL ULTRASOUND N/A 11/28/2017   Procedure: ENDOBRONCHIAL ULTRASOUND;  Surgeon: Shane Crutch, MD;  Location: [facility] ORS;  Service: Pulmonary;  Laterality: N/A;   INSERTION OF ILIAC STENT Bilateral 09/16/2021   Procedure: INSERTION OF ILIAC STENT;  Surgeon: Annice Needy, MD;  Location: [facility] ORS;  Service: Vascular;  Laterality: Bilateral;   LOWER EXTREMITY ANGIOGRAPHY Right 08/12/2021   Procedure: Lower Extremity Angiography;  Surgeon: Annice Needy, MD;  Location: [facility] INVASIVE CV LAB;  Service: Cardiovascular;  Laterality: Right;   LOWER EXTREMITY ANGIOGRAPHY Left 03/07/2023   Procedure: Lower Extremity Angiography;  Surgeon: Annice Needy, MD;  Location: [facility] INVASIVE CV LAB;  Service: Cardiovascular;  Laterality: Left;   ORIF ANKLE FRACTURE Left 03/11/2023   Procedure: OPEN REDUCTION INTERNAL FIXATION (ORIF) ANKLE FRACTURE ANKLE TRIMALLEOLAR;  Surgeon: Gwyneth Revels, DPM;  Location: [facility] ORS;  Service: Orthopedics/Podiatry;  Laterality: Left;  Regional block with general   PARTIAL HYSTERECTOMY N/A    REDUCTION MAMMAPLASTY     VIDEO ASSISTED THORACOSCOPY (VATS)/THOROCOTOMY Right 03/2018   Procedure: VIDEO ASSISTED THORACOSCOPY (VATS)/THOROCOTOMY (partial decortication, loculated empyema); Location: [facility]     reports that she has been smoking cigarettes. She has a 10 pack-year smoking history. She has never used smokeless tobacco. She reports that she does not drink alcohol and does not use  drugs.  Allergies  Allergen Reactions   Penicillins Other (See Comments)    Tolerated cephalosporins with no significant documented ADRs -3rd generation: CEFTRIAXONE: 04/14/2018, 05/26/2021 and CEFDINIR: 04/14/2018  -4th generation CEFEPIME: 04/18/2018  Has tolerated amoxicillin-clavulanate in the past.   PCN rxn causing immediate rash, facial/tongue/throat swelling, SOB or lightheadedness with hypotension: Yes  No reaction causing severe rash involving mucus membranes or skin necrosis, reaction that required hospitalization, or reaction occurring within last 10 years.    Family History  Problem Relation Age of Onset   Breast cancer Mother 68   Hypertension Mother    Dementia Mother    Breast cancer Maternal Aunt 36   Breast cancer Other 35   Hypertension Father    Heart disease Father    Diabetes Father    Diabetes Sister      Prior to Admission medications   Medication Sig Start Date End Date Taking? Authorizing Provider  acetaminophen (TYLENOL) 500 MG tablet Take 2 tablets (1,000 mg total) by mouth every 8 (eight) hours. 02/04/23   Esaw Grandchild A, DO  albuterol (PROVENTIL HFA;VENTOLIN HFA) 108 (90 Base) MCG/ACT inhaler Inhale into the lungs every 6 (six) hours as needed for wheezing or shortness of breath.    [provider]  amitriptyline (ELAVIL) 100 MG tablet Take 100 mg by mouth at bedtime. 06/02/22 09/27/23  [provider]  aspirin EC 81 MG tablet Take 1 tablet (81 mg total) by mouth daily. 08/12/21   Annice Needy, MD  atorvastatin (LIPITOR) 40 MG tablet Take 40 mg by mouth daily. 05/18/21   [provider]  buprenorphine (BUTRANS) 7.5 MCG/HR Place 1 patch onto the skin once a week. 09/27/23 12/20/23  Edward Jolly, MD  clopidogrel (PLAVIX) 75 MG tablet TAKE 1 TABLET BY MOUTH DAILY AT 6AM Patient taking differently: Take 75 mg by mouth daily. 04/30/22   Georgiana Spinner, NP  furosemide (LASIX) 20 MG tablet Take 20 mg by mouth daily. 04/19/23    [provider]  gabapentin (NEURONTIN) 300 MG capsule Take 2 capsules (600 mg total) by mouth at bedtime. Patient taking differently: Take 600 mg by mouth 2 (two) times daily. One capsule in the morning, 2 capsules at bedtime 10/29/21   Georgiana Spinner, NP  ipratropium-albuterol (DUONEB) 0.5-2.5 (3) MG/3ML SOLN Take 3 mLs by nebulization every 6 (six) hours as needed. 02/04/23 09/27/23  Esaw Grandchild A, DO  Magnesium 400 MG CAPS Take 1 tablet by mouth daily.    [provider]  nitroGLYCERIN (NITROSTAT) 0.4 MG SL tablet Place under the tongue. 01/20/23 01/20/24  [provider]  TRELEGY ELLIPTA 100-62.5-25 MCG/ACT AEPB Inhale 1 puff into the lungs daily. 05/30/21   Enedina Finner, MD  vitamin B-12 (CYANOCOBALAMIN) 1000 MCG tablet Take 1,000 mcg by mouth daily.    [provider]  Vitamin E 268 MG (400 UNIT) CAPS Take 400 Units by mouth daily.    [provider]    Physical Exam: Vitals:   10/01/23 1207 10/01/23 1236 10/01/23 1237 10/01/23 1239  BP: 118/63     Pulse: 90     Resp: 18     Temp: 98.3 F (36.8 C)     TempSrc: Axillary     SpO2: 97% 93% 93% 92%  Weight:      Height:        Constitutional: NAD, calm, comfortable Vitals:   10/01/23 1207 10/01/23 1236 10/01/23 1237 10/01/23 1239  BP: 118/63     Pulse: 90     Resp: 18     Temp: 98.3 F (36.8 C)     TempSrc: Axillary     SpO2: 97% 93% 93% 92%  Weight:      Height:       Eyes: PERRL, lids and conjunctivae normal ENMT: Mucous membranes are moist. Posterior pharynx clear of any exudate or lesions.Normal dentition.  Neck: normal, supple, no masses, no thyromegaly Respiratory: Diminished breathing sound bilaterally, diffused wheezing, no crackles, increasing respiratory effort. No accessory muscle use.  Cardiovascular: Regular rate and rhythm, no murmurs / rubs / gallops. No extremity edema. 2+ pedal pulses. No carotid bruits.  Abdomen: no tenderness, no masses palpated. No  hepatosplenomegaly. Bowel sounds positive.  Musculoskeletal: no clubbing / cyanosis. No joint deformity upper and lower extremities. Good ROM, no contractures. Normal muscle tone.  Skin: no rashes, lesions, ulcers. No induration Neurologic: CN 2-12 grossly intact. Sensation intact, DTR normal. Strength 5/5 in all 4.  Psychiatric: Normal judgment and insight. Alert and oriented x 3. Normal mood.    Labs on Admission: I have personally reviewed following labs and imaging studies  CBC: Recent Labs  Lab 10/01/23 1123  WBC 6.8  NEUTROABS 4.8  HGB 12.9  HCT 42.0  MCV 88.8  PLT 267   Basic Metabolic Panel: Recent Labs  Lab 10/01/23 1123  NA 138  K 4.3  CL 102  CO2 26  GLUCOSE 91  BUN 19  CREATININE 1.00  CALCIUM 9.0   GFR: Estimated Creatinine Clearance: 39.7 mL/min (by C-G formula based on SCr of 1 mg/dL). Liver Function Tests: Recent Labs  Lab 10/01/23 1123  AST 21  ALT 14  ALKPHOS 120  BILITOT 0.7  PROT 7.2  ALBUMIN 4.0   Recent Labs  Lab 10/01/23 1123  LIPASE 19   No results for input(s): "AMMONIA" in the last 168 hours. Coagulation Profile: No results for input(s): "INR", "PROTIME" in the last 168 hours. Cardiac Enzymes: No results for input(s): "CKTOTAL", "CKMB", "CKMBINDEX", "TROPONINI" in the last 168 hours. BNP (last 3 results) No results for input(s): "PROBNP" in the last 8760 hours. HbA1C: No results for input(s): "HGBA1C" in the last 72 hours. CBG: No results for input(s): "GLUCAP" in the last 168 hours. Lipid Profile: No results for input(s): "CHOL", "HDL", "LDLCALC", "TRIG", "CHOLHDL", "LDLDIRECT" in the last 72 hours. Thyroid Function Tests: No results for input(s): "TSH", "T4TOTAL", "FREET4", "T3FREE", "THYROIDAB" in the last 72 hours. Anemia Panel: No results for input(s): "VITAMINB12", "FOLATE", "FERRITIN", "TIBC", "IRON", "RETICCTPCT" in the last 72 hours. Urine analysis:    Component Value Date/Time   COLORURINE YELLOW (A) 03/03/2023  1635   APPEARANCEUR HAZY (A) 03/03/2023 1635   LABSPEC 1.033 (H) 03/03/2023 1635   PHURINE 5.0 03/03/2023 1635   GLUCOSEU NEGATIVE 03/03/2023 1635   HGBUR SMALL (A) 03/03/2023 1635   BILIRUBINUR NEGATIVE 03/03/2023 1635   KETONESUR NEGATIVE 03/03/2023 1635   PROTEINUR NEGATIVE 03/03/2023 1635   NITRITE POSITIVE (A) 03/03/2023 1635   LEUKOCYTESUR NEGATIVE 03/03/2023 1635    Radiological Exams on Admission: DG Chest 2 View Result Date: 10/01/2023 CLINICAL DATA:  73 year old female with shortness of breath, hypoxia. EXAM: CHEST - 2 VIEW COMPARISON:  CTA chest 03/03/2023 and earlier. FINDINGS: AP seated and lateral views 1138 hours. Chronically large lung volumes, some emphysema demonstrated by CTA last year. Chronic tortuosity of the thoracic aorta, mediastinal contours are stable. Coarse, symmetric interstitial markings are stable. No pneumothorax, pulmonary edema, pleural effusion or confluent lung opacity identified. Osteopenia. Chronic L1 compression fracture. A T6 compression fracture is new since last year, age indeterminate. Negative visible bowel gas. Calcified aortic atherosclerosis. IMPRESSION: 1. Chronic lung disease with evidence hyperinflation. No acute cardiopulmonary abnormality. 2. Osteopenia with a T6 compression fracture new since last September, age indeterminate. Chronic L1 compression fracture. Electronically Signed   By: Odessa Fleming M.D.   On: 10/01/2023 11:59    EKG: Independently reviewed.  Sinus rhythm, reported by computer as A-fib but has P waves before each QRS, no acute ST changes.  Assessment/Plan Principal Problem:   COPD (chronic obstructive pulmonary disease) (HCC) Active Problems:   COPD exacerbation (HCC)   Tobacco abuse  (please populate well all problems here in Problem List. (For example, if patient is on BP meds at home and you resume or decide to hold them, it is a problem that needs to be her. Same for CAD, COPD, HLD and so on)  Acute hypoxic respiratory  failure Acute COPD exacerbation -Given there is a change of sputum production and color, treat patient with short course of antibiotics with azithromycin -IV Solu-Medrol bridging for p.o. prednisone -Continue ICS and LABA -DuoNebs -Incentive spirometry and flutter valve -Smoking cessation education performed at bedside  T6 compression fracture, vertebral pathological fracture Osteoporosis -Patient reported  that she takes vitamin D3 and calcium supplement -Recommend outpatient Prolia -P.o. oxycodone PRN and lidocaine patch  HTN Chronic HFpEF -Euvolemic -Continue p.o. Lasix  History of CAD -No acute concern -Continue aspirin Plavix and statin  Cigarette smoking -Cessation education performed at bedside  DVT prophylaxis: Lovenox Code Status: Full code Family Communication: None at bedside Disposition Plan: Expect less than 2 midnight hospital stay Consults called: None Admission status: MedSurg observation   Emeline General MD [facility] Pager [phone]  10/01/2023, 1:22 PM

## 2023-10-02 DIAGNOSIS — Z1152 Encounter for screening for COVID-19: Secondary | ICD-10-CM | POA: Diagnosis not present

## 2023-10-02 DIAGNOSIS — I2510 Atherosclerotic heart disease of native coronary artery without angina pectoris: Secondary | ICD-10-CM | POA: Diagnosis present

## 2023-10-02 DIAGNOSIS — M4854XA Collapsed vertebra, not elsewhere classified, thoracic region, initial encounter for fracture: Secondary | ICD-10-CM | POA: Diagnosis present

## 2023-10-02 DIAGNOSIS — Z8616 Personal history of COVID-19: Secondary | ICD-10-CM | POA: Diagnosis not present

## 2023-10-02 DIAGNOSIS — Z8249 Family history of ischemic heart disease and other diseases of the circulatory system: Secondary | ICD-10-CM | POA: Diagnosis not present

## 2023-10-02 DIAGNOSIS — R933 Abnormal findings on diagnostic imaging of other parts of digestive tract: Secondary | ICD-10-CM | POA: Diagnosis not present

## 2023-10-02 DIAGNOSIS — J439 Emphysema, unspecified: Secondary | ICD-10-CM | POA: Diagnosis present

## 2023-10-02 DIAGNOSIS — F1120 Opioid dependence, uncomplicated: Secondary | ICD-10-CM | POA: Diagnosis present

## 2023-10-02 DIAGNOSIS — Z720 Tobacco use: Secondary | ICD-10-CM

## 2023-10-02 DIAGNOSIS — F32A Depression, unspecified: Secondary | ICD-10-CM | POA: Diagnosis present

## 2023-10-02 DIAGNOSIS — M4856XA Collapsed vertebra, not elsewhere classified, lumbar region, initial encounter for fracture: Secondary | ICD-10-CM | POA: Diagnosis present

## 2023-10-02 DIAGNOSIS — I739 Peripheral vascular disease, unspecified: Secondary | ICD-10-CM | POA: Diagnosis present

## 2023-10-02 DIAGNOSIS — Z66 Do not resuscitate: Secondary | ICD-10-CM | POA: Diagnosis present

## 2023-10-02 DIAGNOSIS — E785 Hyperlipidemia, unspecified: Secondary | ICD-10-CM | POA: Diagnosis present

## 2023-10-02 DIAGNOSIS — R0602 Shortness of breath: Secondary | ICD-10-CM | POA: Diagnosis present

## 2023-10-02 DIAGNOSIS — D638 Anemia in other chronic diseases classified elsewhere: Secondary | ICD-10-CM | POA: Diagnosis present

## 2023-10-02 DIAGNOSIS — F17210 Nicotine dependence, cigarettes, uncomplicated: Secondary | ICD-10-CM | POA: Diagnosis present

## 2023-10-02 DIAGNOSIS — G894 Chronic pain syndrome: Secondary | ICD-10-CM | POA: Diagnosis present

## 2023-10-02 DIAGNOSIS — I5032 Chronic diastolic (congestive) heart failure: Secondary | ICD-10-CM | POA: Diagnosis present

## 2023-10-02 DIAGNOSIS — I255 Ischemic cardiomyopathy: Secondary | ICD-10-CM | POA: Diagnosis present

## 2023-10-02 DIAGNOSIS — Z79899 Other long term (current) drug therapy: Secondary | ICD-10-CM | POA: Diagnosis not present

## 2023-10-02 DIAGNOSIS — J9601 Acute respiratory failure with hypoxia: Secondary | ICD-10-CM | POA: Diagnosis present

## 2023-10-02 DIAGNOSIS — F17290 Nicotine dependence, other tobacco product, uncomplicated: Secondary | ICD-10-CM | POA: Diagnosis present

## 2023-10-02 DIAGNOSIS — B3781 Candidal esophagitis: Secondary | ICD-10-CM | POA: Diagnosis present

## 2023-10-02 DIAGNOSIS — R1310 Dysphagia, unspecified: Secondary | ICD-10-CM | POA: Diagnosis not present

## 2023-10-02 DIAGNOSIS — J441 Chronic obstructive pulmonary disease with (acute) exacerbation: Secondary | ICD-10-CM | POA: Diagnosis present

## 2023-10-02 DIAGNOSIS — K7460 Unspecified cirrhosis of liver: Secondary | ICD-10-CM | POA: Diagnosis present

## 2023-10-02 DIAGNOSIS — I110 Hypertensive heart disease with heart failure: Secondary | ICD-10-CM | POA: Diagnosis present

## 2023-10-02 LAB — PROCALCITONIN: Procalcitonin: <0.1 ng/mL

## 2023-10-02 MED ORDER — ACETAMINOPHEN 325 MG PO TABS
650.0000 mg | ORAL_TABLET | Freq: Three times a day (TID) | ORAL | Status: DC
  Administered 2023-10-02 – 2023-10-06 (×11): 650 mg via ORAL
  Filled 2023-10-02 (×12): qty 2

## 2023-10-02 MED ORDER — HYDROCODONE-ACETAMINOPHEN 5-325 MG PO TABS
1.0000 | ORAL_TABLET | Freq: Four times a day (QID) | ORAL | Status: DC | PRN
  Administered 2023-10-02 – 2023-10-05 (×3): 1 via ORAL
  Filled 2023-10-02 (×3): qty 1

## 2023-10-02 MED ORDER — NICOTINE 21 MG/24HR TD PT24
21.0000 mg | MEDICATED_PATCH | Freq: Every day | TRANSDERMAL | Status: DC
  Administered 2023-10-02 – 2023-10-06 (×5): 21 mg via TRANSDERMAL
  Filled 2023-10-02 (×5): qty 1

## 2023-10-02 MED ORDER — ENSURE ENLIVE PO LIQD
237.0000 mL | Freq: Two times a day (BID) | ORAL | Status: DC
Start: 2023-10-02 — End: 2023-10-06
  Administered 2023-10-02 – 2023-10-06 (×7): 237 mL via ORAL

## 2023-10-02 MED ORDER — OXYCODONE HCL 5 MG PO TABS
10.0000 mg | ORAL_TABLET | Freq: Four times a day (QID) | ORAL | Status: DC | PRN
  Administered 2023-10-02 – 2023-10-05 (×9): 10 mg via ORAL
  Filled 2023-10-02 (×9): qty 2

## 2023-10-02 NOTE — Progress Notes (Signed)
 Patient given incentive spirometer and flutter valve and instructed on use.

## 2023-10-02 NOTE — Plan of Care (Signed)

## 2023-10-02 NOTE — Progress Notes (Signed)
 PROGRESS NOTE    Wendy Arias  NFA:[id number] DOB: 05/09/1951 DOA: 10/01/2023 PCP: Wilford Corner, PA-C  Chief Complaint  Patient presents with   Shortness of Breath    Hospital Course:  Wendy Arias is 73 y.o. female with COPD Gold stage II, chronic heart failure preserved EF, tobacco abuse, CAD status post stenting, chronic pain syndrome, chronic narcotic dependence, who presents with worsening dyspnea and back pain.  Patient endorses productive cough and thick sputum with wheezing and exertional dyspnea.  She reports prior to arrival she developed back pain and rib cage pain worse with coughing and deep breath.  On arrival to the ED she was tachycardic, not hypotensive, and hypoxic to 80% on room air.  Chest x-ray revealed emphysema like changes and new onset of T6 compression fracture and chronic L1 compression fracture.  Patient was given breathing treatments but was persistently dyspneic and thus admitted for further workup.  Subjective: This morning patient reports that she is still in pain, she is still coughing.  Currently on 5 L nasal cannula.  She is requesting increasing her pain medication.  She endorses that the abdominal pain and left-sided rib pain has been chronic but is acutely worse. When discussing CT findings of esophageal abnormality patient endorses that she vomits every time she eats regardless of solid or liquid consistency.  She endorses this has been happening daily for the last year.  She believes she has had about a 20 pound weight loss.   She denies history of ever having endoscopy in the past.   Objective: Vitals:   10/02/23 0710 10/02/23 0800 10/02/23 0900 10/02/23 0939  BP:  133/68 (!) 149/65   Pulse: 91 85 83 88  Resp:  13 (!) 21 (!) 26  Temp:      TempSrc:      SpO2: 94% 94% 92% 94%  Weight:      Height:       No intake or output data in the 24 hours ending 10/02/23 1009 Filed Weights   10/01/23 1151  Weight: 57.2 kg     Examination: General exam: Appears calm and comfortable, NAD  Respiratory system: No work of breathing, end expiratory wheeze, currently on 5 L nasal cannula. Cardiovascular system: S1 & S2 heard, RRR.  Gastrointestinal system: Abdomen is nondistended, soft and nontender.  Neuro: Alert and oriented. No focal neurological deficits. Extremities: Symmetric, expected ROM Skin: No rashes, lesions Psychiatry: Demonstrates appropriate judgement and insight. Mood & affect appropriate for situation.   Assessment & Plan:  Principal Problem:   COPD (chronic obstructive pulmonary disease) (HCC) Active Problems:   COPD exacerbation (HCC)   Tobacco abuse    Acute hypoxic respiratory failure Acute COPD exacerbation - Currently on azithromycin, procalcitonin pending may be able to discontinue.  Afebrile without leukocytosis - Respiratory viral panel negative - Sputum culture pending, continue to follow - Currently on IV Solu-Medrol, will continue with prednisone taper as tolerated - Continue with nebulizer treatments - Encourage incentive spirometry and flutter valve - Currently requiring supplemental oxygen, is on no O2 at home.  Continue to wean as tolerated.  Goal O2 sats 88 to 92%.  Esophageal mass Chronic nausea vomiting - CT chest with incidental finding of esophageal thickening.  Concerning for esophageal mass.  This was not seen on October CT - Patient endorses daily vomiting undigested food with solids and liquids, as well as 20 pound weight loss - Patient would benefit from EGD this admission.  At this time respiratory  status too unstable.  Will consult gastroenterology when off oxygen  Tobacco abuse - Added smoking cessation - Nicotine patch ordered  T6 compression fracture Osteoporosis - Patient endorses taking vitamin D and calcium outpatient - Will likely need more aggressive therapy.  Needs to follow-up with PCP - As needed pain meds - PT/OT  Hypertension Chronic  heart failure with preserved EF - Clinically euvolemic - Resume home meds  CAD - Continue aspirin, Plavix, statin  Chronic pain syndrome Neuropathic pain lower extremity Peripheral vascular disease - History of femoral endarterectomy in March 2023 - Follows outpatient with pain clinic - Resume home meds for now  DVT prophylaxis: Lovenox   Code Status: Limited: Do not attempt resuscitation (DNR) -DNR-LIMITED -Do Not Intubate/DNI  Family Communication:  Discussed directly with patient Disposition: Will make inpatient, has ongoing supplemental oxygen needs.  Currently on high flow nasal cannula.  Would also benefit from gastroenterology consult and EGD during this admission.  Continue to monitor closely and wean O2 as tolerated.   Consultants:    Procedures:    Antimicrobials:  Anti-infectives (From admission, onward)    Start     Dose/Rate Route Frequency Ordered Stop   10/01/23 1330  azithromycin (ZITHROMAX) tablet 500 mg        500 mg Oral Daily 10/01/23 1317         Data Reviewed: I have personally reviewed following labs and imaging studies CBC: Recent Labs  Lab 10/01/23 1123  WBC 6.8  NEUTROABS 4.8  HGB 12.9  HCT 42.0  MCV 88.8  PLT 267   Basic Metabolic Panel: Recent Labs  Lab 10/01/23 1123  NA 138  K 4.3  CL 102  CO2 26  GLUCOSE 91  BUN 19  CREATININE 1.00  CALCIUM 9.0   GFR: Estimated Creatinine Clearance: 39.7 mL/min (by C-G formula based on SCr of 1 mg/dL). Liver Function Tests: Recent Labs  Lab 10/01/23 1123  AST 21  ALT 14  ALKPHOS 120  BILITOT 0.7  PROT 7.2  ALBUMIN 4.0   CBG: No results for input(s): "GLUCAP" in the last 168 hours.  Recent Results (from the past 240 hours)  Resp panel by RT-PCR (RSV, Flu A&B, Covid) Anterior Nasal Swab     Status: None   Collection Time: 10/01/23 11:23 AM   Specimen: Anterior Nasal Swab  Result Value Ref Range Status   SARS Coronavirus 2 by RT PCR NEGATIVE NEGATIVE Final    Comment:  (NOTE) SARS-CoV-2 target nucleic acids are NOT DETECTED.  The SARS-CoV-2 RNA is generally detectable in upper respiratory specimens during the acute phase of infection. The lowest concentration of SARS-CoV-2 viral copies this assay can detect is 138 copies/mL. A negative result does not preclude SARS-Cov-2 infection and should not be used as the sole basis for treatment or other patient management decisions. A negative result may occur with  improper specimen collection/handling, submission of specimen other than nasopharyngeal swab, presence of viral mutation(s) within the areas targeted by this assay, and inadequate number of viral copies(<138 copies/mL). A negative result must be combined with clinical observations, patient history, and epidemiological information. The expected result is Negative.  Fact Sheet for Patients:  BloggerCourse.com  Fact Sheet for Healthcare Providers:  SeriousBroker.it  This test is no t yet approved or cleared by the [place] FDA and  has been authorized for detection and/or diagnosis of SARS-CoV-2 by FDA under an Emergency Use Authorization (EUA). This EUA will remain  in effect (meaning this test can be  used) for the duration of the COVID-19 declaration under Section 564(b)(1) of the Act, 21 U.S.C.section 360bbb-3(b)(1), unless the authorization is terminated  or revoked sooner.       Influenza A by PCR NEGATIVE NEGATIVE Final   Influenza B by PCR NEGATIVE NEGATIVE Final    Comment: (NOTE) The Xpert Xpress SARS-CoV-2/FLU/RSV plus assay is intended as an aid in the diagnosis of influenza from Nasopharyngeal swab specimens and should not be used as a sole basis for treatment. Nasal washings and aspirates are unacceptable for Xpert Xpress SARS-CoV-2/FLU/RSV testing.  Fact Sheet for Patients: BloggerCourse.com  Fact Sheet for Healthcare  Providers: SeriousBroker.it  This test is not yet approved or cleared by the [place] FDA and has been authorized for detection and/or diagnosis of SARS-CoV-2 by FDA under an Emergency Use Authorization (EUA). This EUA will remain in effect (meaning this test can be used) for the duration of the COVID-19 declaration under Section 564(b)(1) of the Act, 21 U.S.C. section 360bbb-3(b)(1), unless the authorization is terminated or revoked.     Resp Syncytial Virus by PCR NEGATIVE NEGATIVE Final    Comment: (NOTE) Fact Sheet for Patients: BloggerCourse.com  Fact Sheet for Healthcare Providers: SeriousBroker.it  This test is not yet approved or cleared by the [place] FDA and has been authorized for detection and/or diagnosis of SARS-CoV-2 by FDA under an Emergency Use Authorization (EUA). This EUA will remain in effect (meaning this test can be used) for the duration of the COVID-19 declaration under Section 564(b)(1) of the Act, 21 U.S.C. section 360bbb-3(b)(1), unless the authorization is terminated or revoked.  Performed at [facility], [address] [place], [place] [address]   Respiratory (~20 pathogens) panel by PCR     Status: None   Collection Time: 10/01/23  1:40 PM   Specimen: Nasopharyngeal Swab; Respiratory  Result Value Ref Range Status   Adenovirus NOT DETECTED NOT DETECTED Final   Coronavirus 229E NOT DETECTED NOT DETECTED Final    Comment: (NOTE) The Coronavirus on the Respiratory Panel, DOES NOT test for the novel  Coronavirus (2019 nCoV)    Coronavirus HKU1 NOT DETECTED NOT DETECTED Final   Coronavirus NL63 NOT DETECTED NOT DETECTED Final   Coronavirus OC43 NOT DETECTED NOT DETECTED Final   Metapneumovirus NOT DETECTED NOT DETECTED Final   Rhinovirus / Enterovirus NOT DETECTED NOT DETECTED Final   Influenza A NOT DETECTED NOT DETECTED Final   Influenza B NOT  DETECTED NOT DETECTED Final   Parainfluenza Virus 1 NOT DETECTED NOT DETECTED Final   Parainfluenza Virus 2 NOT DETECTED NOT DETECTED Final   Parainfluenza Virus 3 NOT DETECTED NOT DETECTED Final   Parainfluenza Virus 4 NOT DETECTED NOT DETECTED Final   Respiratory Syncytial Virus NOT DETECTED NOT DETECTED Final   Bordetella pertussis NOT DETECTED NOT DETECTED Final   Bordetella Parapertussis NOT DETECTED NOT DETECTED Final   Chlamydophila pneumoniae NOT DETECTED NOT DETECTED Final   Mycoplasma pneumoniae NOT DETECTED NOT DETECTED Final    Comment: Performed at [facility], [address] [place], [place] [address]  Expectorated Sputum Assessment w Gram Stain, Rflx to Resp Cult     Status: None   Collection Time: 10/01/23  3:36 PM   Specimen: Sputum  Result Value Ref Range Status   Specimen Description SPUTUM  Final   Special Requests NONE  Final   Sputum evaluation   Final    THIS SPECIMEN IS ACCEPTABLE FOR SPUTUM CULTURE Performed at [facility], [address], [place], [place] [address]    Report Status 10/01/2023 FINAL  Final  Culture, Respiratory w Gram Stain     Status: None (Preliminary result)   Collection Time: 10/01/23  3:36 PM   Specimen: SPU  Result Value Ref Range Status   Specimen Description   Final    SPUTUM Performed at [facility], [address], [place], [place] [address]    Special Requests   Final    NONE Reflexed from [id number] Performed at [facility], [address] [place], [place] [address]    Gram Stain   Final    FEW WBC PRESENT, PREDOMINANTLY PMN FEW GRAM POSITIVE COCCI RARE GRAM NEGATIVE RODS RARE GRAM NEGATIVE DIPLOCOCCI Performed at [facility], [address], [place], [place] [address]    Culture PENDING  Incomplete   Report Status PENDING  Incomplete     Radiology Studies: CT Angio Chest Pulmonary Embolism (PE) W or WO Contrast Result Date: 10/01/2023 CLINICAL DATA:  Shortness of breath.   Hypoxia.  Coughing. EXAM: CT ANGIOGRAPHY CHEST WITH CONTRAST TECHNIQUE: Multidetector CT imaging of the chest was performed using the standard protocol during bolus administration of intravenous contrast. Multiplanar CT image reconstructions and MIPs were obtained to evaluate the vascular anatomy. RADIATION DOSE REDUCTION: This exam was performed according to the departmental dose-optimization program which includes automated exposure control, adjustment of the mA and/or kV according to patient size and/or use of iterative reconstruction technique. CONTRAST:  75mL OMNIPAQUE IOHEXOL 350 MG/ML SOLN COMPARISON:  03/03/2023 FINDINGS: Cardiovascular: Satisfactory opacification of pulmonary arteries noted, and no pulmonary emboli identified. No evidence of thoracic aortic dissection or aneurysm. Mediastinum/Nodes: Mild mediastinal lymphadenopathy shows mild decrease since previous study, with index lymph node in the right paratracheal region measuring 15 mm compared to 19 mm previously. Concentric wall thickening is seen involving the midthoracic esophagus, suspicious for esophageal mass. Mild dilatation and debris is seen in the upper thoracic esophagus. Lungs/Pleura: Previously seen left lower lobe airspace disease has resolved since previous study. Bilateral pleural-parenchymal scarring is again noted. Mild emphysema again seen. No evidence of pulmonary consolidation, mass, or pleural effusion. Upper abdomen: No acute findings. Musculoskeletal: No suspicious bone lesions identified. Old compression fracture deformity of the L1 vertebral body again noted. Review of the MIP images confirms the above findings. IMPRESSION: No evidence of pulmonary embolism. Concentric wall thickening of midthoracic esophagus, with proximal esophageal dilatation. This is suspicious for esophageal mass. Mild mediastinal lymphadenopathy, mildly decreased since previous study. Interval resolution of previously seen left lower lobe airspace  disease. Emphysema (ICD10-J43.9) and bilateral pleural-parenchymal scarring. Electronically Signed   By: Danae Orleans M.D.   On: 10/01/2023 15:13   DG Chest 2 View Result Date: 10/01/2023 CLINICAL DATA:  73 year old female with shortness of breath, hypoxia. EXAM: CHEST - 2 VIEW COMPARISON:  CTA chest 03/03/2023 and earlier. FINDINGS: AP seated and lateral views 1138 hours. Chronically large lung volumes, some emphysema demonstrated by CTA last year. Chronic tortuosity of the thoracic aorta, mediastinal contours are stable. Coarse, symmetric interstitial markings are stable. No pneumothorax, pulmonary edema, pleural effusion or confluent lung opacity identified. Osteopenia. Chronic L1 compression fracture. A T6 compression fracture is new since last year, age indeterminate. Negative visible bowel gas. Calcified aortic atherosclerosis. IMPRESSION: 1. Chronic lung disease with evidence hyperinflation. No acute cardiopulmonary abnormality. 2. Osteopenia with a T6 compression fracture new since last September, age indeterminate. Chronic L1 compression fracture. Electronically Signed   By: Althea Grimmer.D.  On: 10/01/2023 11:59    Scheduled Meds:  acetaminophen  1,000 mg Oral Q8H   amitriptyline  100 mg Oral QHS   aspirin EC  81 mg Oral Daily   atorvastatin  40 mg Oral Daily   azithromycin  500 mg Oral Daily   clopidogrel  75 mg Oral Daily   enoxaparin (LOVENOX) injection  40 mg Subcutaneous Q24H   fluticasone furoate-vilanterol  1 puff Inhalation Daily   furosemide  20 mg Oral Daily   gabapentin  600 mg Oral QHS   guaiFENesin  600 mg Oral BID   ipratropium-albuterol  3 mL Nebulization Q4H   lidocaine  1 patch Transdermal Q24H   predniSONE  40 mg Oral Q breakfast   Continuous Infusions:   LOS: 0 days  MDM: Patient is high risk for one or more organ failure.  They necessitate ongoing hospitalization for continued IV therapies and subsequent lab monitoring. Total time spent interpreting labs and vitals,  coordinating care amongst consultants and care team members, directly assessing and discussing care with the patient and/or family: 55 min    Debarah Crape, DO [facility]  To contact the attending physician between 7A-7P please use Epic Chat. To contact the covering physician during after hours 7P-7A, please review Amion.   10/02/2023, 10:09 AM   *This document has been created with the assistance of dictation software. Please excuse typographical errors. *

## 2023-10-02 NOTE — Care Management Obs Status (Signed)
 MEDICARE OBSERVATION STATUS NOTIFICATION   Patient Details  Name: Wendy Arias MRN: [id number] Date of Birth: February 04, 1951   Medicare Observation Status Notification Given:  Yes    Susa Simmonds, LCSWA 10/02/2023, 1:49 PM

## 2023-10-03 DIAGNOSIS — R933 Abnormal findings on diagnostic imaging of other parts of digestive tract: Secondary | ICD-10-CM

## 2023-10-03 DIAGNOSIS — J441 Chronic obstructive pulmonary disease with (acute) exacerbation: Secondary | ICD-10-CM | POA: Diagnosis not present

## 2023-10-03 DIAGNOSIS — G8929 Other chronic pain: Secondary | ICD-10-CM

## 2023-10-03 DIAGNOSIS — Z720 Tobacco use: Secondary | ICD-10-CM | POA: Diagnosis not present

## 2023-10-03 DIAGNOSIS — K2289 Other specified disease of esophagus: Secondary | ICD-10-CM

## 2023-10-03 MED ORDER — MORPHINE SULFATE (PF) 2 MG/ML IV SOLN
2.0000 mg | INTRAVENOUS | Status: DC | PRN
  Administered 2023-10-03 – 2023-10-05 (×3): 2 mg via INTRAVENOUS
  Filled 2023-10-03 (×3): qty 1

## 2023-10-03 MED ORDER — PANTOPRAZOLE SODIUM 40 MG PO TBEC
40.0000 mg | DELAYED_RELEASE_TABLET | Freq: Every day | ORAL | Status: DC
  Administered 2023-10-03 – 2023-10-05 (×3): 40 mg via ORAL
  Filled 2023-10-03 (×3): qty 1

## 2023-10-03 NOTE — Plan of Care (Signed)

## 2023-10-03 NOTE — Consult Note (Addendum)
 Midge Minium, MD [facility]  [address] [place], [place] [address] Phone: [phone] Fax : [phone]  Consultation  Referring Provider:     Dr, Rennis Chris Primary Care Physician:  Wilford Corner, PA-C Primary Gastroenterologist: [facility]         Reason for Consultation:     Epigastric pain with abnormal CT scan  Date of Admission:  10/01/2023 Date of Consultation:  10/03/2023         HPI:   Wendy Arias is a 73 y.o. female who comes in with a history of of her last colonoscopy in 2018 and has had abdominal pain that she reports to be in the left upper quadrant and epigastric area.  She went to her PCP who did a CT scan on her that showed:  IMPRESSION: No acute findings within the abdomen or pelvis.   Decreased size of small lesion adjacent to the left psoas muscle, consistent with benign etiology.   Stable 2.8 cm infrarenal abdominal aortic aneurysm. Recommend surveillance ultrasound in 5 years.  Patient also underwent a CT angio for pulmonary embolus that showed:  IMPRESSION: No evidence of pulmonary embolism.   Concentric wall thickening of midthoracic esophagus, with proximal esophageal dilatation. This is suspicious for esophageal mass.   Mild mediastinal lymphadenopathy, mildly decreased since previous study.   Interval resolution of previously seen left lower lobe airspace disease.   Emphysema (ICD10-J43.9) and bilateral pleural-parenchymal scarring.  The hospice is now consulting me for the concentric wall thickening in the mid thoracic esophagus with proximal esophageal dilation suspicious for an esophageal mass.  The patient is here for COPD exacerbation and is on Plavix for vascular issues.  The last time she had her Plavix was 937 this morning.  Past Medical History:  Diagnosis Date   (HFpEF) heart failure with preserved ejection fraction (HCC) 06/07/2004   a.) TTE 06/07/2004: EF 50-55%; inf HK. b.) TTE 04/17/2018: EF 45%; GLS -14.6%;  inferoseptal and inferoposterior HK; sev LA enlargement; mild PR, mod TR, sev MR; G2DD. c.) TTE 12/05/2018: EF >55%, mild LVH; mild MR. d.) TTE 12/18/2020: EF >55%; mild LA enlargement; triv TR/PR, mod MR; G1DD; e.) TTE 07/13/2022: EF 55-60%, no RWMAs, mild MR;  f.) TTE 02/02/2023: EF 60-65%, no RMWas, triv MR, G1DD   Acute metabolic encephalopathy    Acute respiratory failure with hypoxia (HCC)    Anemia of chronic disease    Anxiety    Aortic atherosclerosis (HCC)    Asthma    CAD (coronary artery disease) 06/08/2004   a.) NSTEMI 06/05/2004. b.) LHC 06/08/2004: EF 70%; 50% mLAD, 50% dLAD, 99% OM3, 50% pRCA, 50% mRCA; PCI to OM3 placing a DES x 1 (unknown type). c.) Lexi scan 09/14/2021: normal LV function with no ischemia or scar.   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    a.) followed by pain management; on transdermal buprenorphine   COPD (chronic obstructive pulmonary disease) (HCC)    Depression    Diverticulosis    DOE (dyspnea on exertion)    Dysphagia    Empyema of right pleural space (HCC) 02/16/2018   Hepatic cirrhosis (HCC)    History of 2019 novel coronavirus disease (COVID-19) 03/03/2023   HLD (hyperlipidemia)    Hypertension    IDA (iron deficiency anemia)    Infrarenal abdominal aortic aneurysm (AAA) without rupture (HCC) 04/11/2018   a.) CT AP 04/11/2018: 2.5 cm; b.) CT AP 01/05/2022: 2.9 cm; c.) CTA  chest 03/03/2023: 2.9 cm   Ischemic cardiomyopathy    a.) TTE 06/07/2004: 50-55%; b.) TTE 04/17/2018: EF 45%; c.) TTE 12/05/2018: >55%; d.) TTE 12/18/2020: >55%; e.) MV 09/14/2021: EF 64%; f.) TTE 07/13/2022: EF 55-60%; g.) TTE 02/02/2023: 60-65%   Long term current use of aspirin    Long term current use of clopidogrel    NSTEMI (non-ST elevated myocardial infarction) (HCC) 06/05/2004   a.)  PCI on 06/08/2004 placing a DES x1 (unknown type) to 99% lesion in OM3.   Osteopenia    Osteoporosis    Peripheral vascular disease (HCC)    a.) s/p  BILATERAL femoral endarterectomies and open angioplasty with BILATERAL EIA stenting 09/16/2021   Pneumonia    PONV (postoperative nausea and vomiting)     Past Surgical History:  Procedure Laterality Date   COLONOSCOPY WITH PROPOFOL N/A 04/12/2017   Procedure: COLONOSCOPY WITH PROPOFOL;  Surgeon: Christena Deem, MD;  Location: [facility] ENDOSCOPY;  Service: Endoscopy;  Laterality: N/A;   CORONARY ANGIOPLASTY WITH STENT PLACEMENT Left 06/08/2004   Procedure: CORONARY ANGIOPLASTY WITH STENT PLACEMENT; Location: [facility]; Surgeon: Rudean Hitt, MD   ENDARTERECTOMY FEMORAL Bilateral 09/16/2021   Procedure: ENDARTERECTOMY FEMORAL;  Surgeon: Annice Needy, MD;  Location: [facility] ORS;  Service: Vascular;  Laterality: Bilateral;   ENDOBRONCHIAL ULTRASOUND N/A 11/28/2017   Procedure: ENDOBRONCHIAL ULTRASOUND;  Surgeon: Shane Crutch, MD;  Location: [facility] ORS;  Service: Pulmonary;  Laterality: N/A;   INSERTION OF ILIAC STENT Bilateral 09/16/2021   Procedure: INSERTION OF ILIAC STENT;  Surgeon: Annice Needy, MD;  Location: [facility] ORS;  Service: Vascular;  Laterality: Bilateral;   LOWER EXTREMITY ANGIOGRAPHY Right 08/12/2021   Procedure: Lower Extremity Angiography;  Surgeon: Annice Needy, MD;  Location: [facility] INVASIVE CV LAB;  Service: Cardiovascular;  Laterality: Right;   LOWER EXTREMITY ANGIOGRAPHY Left 03/07/2023   Procedure: Lower Extremity Angiography;  Surgeon: Annice Needy, MD;  Location: [facility] INVASIVE CV LAB;  Service: Cardiovascular;  Laterality: Left;   ORIF ANKLE FRACTURE Left 03/11/2023   Procedure: OPEN REDUCTION INTERNAL FIXATION (ORIF) ANKLE FRACTURE ANKLE TRIMALLEOLAR;  Surgeon: Gwyneth Revels, DPM;  Location: [facility] ORS;  Service: Orthopedics/Podiatry;  Laterality: Left;  Regional block with general   PARTIAL HYSTERECTOMY N/A    REDUCTION MAMMAPLASTY     VIDEO ASSISTED THORACOSCOPY (VATS)/THOROCOTOMY Right 03/2018   Procedure: VIDEO ASSISTED THORACOSCOPY (VATS)/THOROCOTOMY (partial  decortication, loculated empyema); Location: [facility]    Prior to Admission medications   Medication Sig Start Date End Date Taking? Authorizing Provider  acetaminophen (TYLENOL) 500 MG tablet Take 2 tablets (1,000 mg total) by mouth every 8 (eight) hours. 02/04/23  Yes Esaw Grandchild A, DO  albuterol (PROVENTIL HFA;VENTOLIN HFA) 108 (90 Base) MCG/ACT inhaler Inhale into the lungs every 6 (six) hours as needed for wheezing or shortness of breath.   Yes [provider]  amitriptyline (ELAVIL) 100 MG tablet Take 100 mg by mouth at bedtime. 06/02/22 10/01/23 Yes [provider]  aspirin EC 81 MG tablet Take 1 tablet (81 mg total) by mouth daily. 08/12/21  Yes Dew, Marlow Baars, MD  atorvastatin (LIPITOR) 40 MG tablet Take 40 mg by mouth daily. 05/18/21  Yes [provider]  clopidogrel (PLAVIX) 75 MG tablet TAKE 1 TABLET BY MOUTH DAILY AT 6AM Patient taking differently: Take 75 mg by mouth daily. 04/30/22  Yes Georgiana Spinner, NP  furosemide (LASIX) 20 MG tablet Take 20 mg by mouth daily. 04/19/23  Yes [provider]  gabapentin (NEURONTIN) 300 MG capsule Take  2 capsules (600 mg total) by mouth at bedtime. Patient taking differently: Take 600 mg by mouth 2 (two) times daily. One capsule in the morning, 2 capsules at bedtime 10/29/21  Yes Georgiana Spinner, NP  ipratropium-albuterol (DUONEB) 0.5-2.5 (3) MG/3ML SOLN Take 3 mLs by nebulization every 6 (six) hours as needed. 02/04/23 10/01/23 Yes Esaw Grandchild A, DO  Magnesium 400 MG CAPS Take 1 tablet by mouth daily.   Yes [provider]  nitroGLYCERIN (NITROSTAT) 0.4 MG SL tablet Place under the tongue. 01/20/23 01/20/24 Yes [provider]  TRELEGY ELLIPTA 100-62.5-25 MCG/ACT AEPB Inhale 1 puff into the lungs daily. 05/30/21  Yes Enedina Finner, MD  vitamin B-12 (CYANOCOBALAMIN) 1000 MCG tablet Take 1,000 mcg by mouth daily.   Yes [provider]  Vitamin E 268 MG (400 UNIT) CAPS Take 400 Units by mouth daily.    Yes [provider]  buprenorphine (BUTRANS) 7.5 MCG/HR Place 1 patch onto the skin once a week. Patient not taking: Reported on 10/01/2023 09/27/23 12/20/23  Edward Jolly, MD    Family History  Problem Relation Age of Onset   Breast cancer Mother 54   Hypertension Mother    Dementia Mother    Breast cancer Maternal Aunt 35   Breast cancer Other 35   Hypertension Father    Heart disease Father    Diabetes Father    Diabetes Sister      Social History   Tobacco Use   Smoking status: Every Day    Current packs/day: 0.50    Average packs/day: 0.5 packs/day for 20.0 years (10.0 ttl pk-yrs)    Types: Cigarettes   Smokeless tobacco: Never  Vaping Use   Vaping status: Some Days   Substances: Nicotine, Flavoring  Substance Use Topics   Alcohol use: Never    Alcohol/week: 3.0 standard drinks of alcohol    Types: 1 Glasses of wine, 2 Cans of beer per week    Comment: rarely   Drug use: Never    Allergies as of 10/01/2023 - Review Complete 10/01/2023  Allergen Reaction Noted   Penicillins Other (See Comments) 09/14/2016    Review of Systems:    All systems reviewed and negative except where noted in HPI.   Physical Exam:  Vital signs in last 24 hours: Temp:  [97.7 F (36.5 C)-98.5 F (36.9 C)] 97.9 F (36.6 C) (04/07 0730) Pulse Rate:  [81-91] 84 (04/07 0730) Resp:  [16-20] 16 (04/07 0730) BP: (139-175)/(71-95) 175/80 (04/07 0730) SpO2:  [91 %-98 %] 94 % (04/07 1142) Last BM Date : 09/30/23 General:   Pleasant, cooperative in NAD Head:  Normocephalic and atraumatic. Eyes:   No icterus.   Conjunctiva pink. PERRLA. Ears:  Normal auditory acuity. Neck:  Supple; no masses or thyroidomegaly Lungs: Diffuse rhonchi with decreased breath sounds Heart:  Regular rate and rhythm;  Without murmur, clicks, rubs or gallops Abdomen:  Soft, nondistended, nontender. Normal bowel sounds. No appreciable masses or hepatomegaly.  No rebound or guarding.  Rectal:  Not  performed. Msk:  Symmetrical without gross deformities.   Extremities:  Without edema, cyanosis or clubbing. Neurologic:  Alert and oriented x3;  grossly normal neurologically. Skin:  Intact without significant lesions or rashes. Cervical Nodes:  No significant cervical adenopathy. Psych:  Alert and cooperative. Normal affect.  LAB RESULTS: Recent Labs    10/01/23 1123  WBC 6.8  HGB 12.9  HCT 42.0  PLT 267   BMET Recent Labs    10/01/23 1123  NA 138  K 4.3  CL 102  CO2 26  GLUCOSE 91  BUN 19  CREATININE 1.00  CALCIUM 9.0   LFT Recent Labs    10/01/23 1123  PROT 7.2  ALBUMIN 4.0  AST 21  ALT 14  ALKPHOS 120  BILITOT 0.7   PT/INR No results for input(s): "LABPROT", "INR" in the last 72 hours.  STUDIES: CT Angio Chest Pulmonary Embolism (PE) W or WO Contrast Result Date: 10/01/2023 CLINICAL DATA:  Shortness of breath.  Hypoxia.  Coughing. EXAM: CT ANGIOGRAPHY CHEST WITH CONTRAST TECHNIQUE: Multidetector CT imaging of the chest was performed using the standard protocol during bolus administration of intravenous contrast. Multiplanar CT image reconstructions and MIPs were obtained to evaluate the vascular anatomy. RADIATION DOSE REDUCTION: This exam was performed according to the departmental dose-optimization program which includes automated exposure control, adjustment of the mA and/or kV according to patient size and/or use of iterative reconstruction technique. CONTRAST:  75mL OMNIPAQUE IOHEXOL 350 MG/ML SOLN COMPARISON:  03/03/2023 FINDINGS: Cardiovascular: Satisfactory opacification of pulmonary arteries noted, and no pulmonary emboli identified. No evidence of thoracic aortic dissection or aneurysm. Mediastinum/Nodes: Mild mediastinal lymphadenopathy shows mild decrease since previous study, with index lymph node in the right paratracheal region measuring 15 mm compared to 19 mm previously. Concentric wall thickening is seen involving the midthoracic esophagus,  suspicious for esophageal mass. Mild dilatation and debris is seen in the upper thoracic esophagus. Lungs/Pleura: Previously seen left lower lobe airspace disease has resolved since previous study. Bilateral pleural-parenchymal scarring is again noted. Mild emphysema again seen. No evidence of pulmonary consolidation, mass, or pleural effusion. Upper abdomen: No acute findings. Musculoskeletal: No suspicious bone lesions identified. Old compression fracture deformity of the L1 vertebral body again noted. Review of the MIP images confirms the above findings. IMPRESSION: No evidence of pulmonary embolism. Concentric wall thickening of midthoracic esophagus, with proximal esophageal dilatation. This is suspicious for esophageal mass. Mild mediastinal lymphadenopathy, mildly decreased since previous study. Interval resolution of previously seen left lower lobe airspace disease. Emphysema (ICD10-J43.9) and bilateral pleural-parenchymal scarring. Electronically Signed   By: Danae Orleans M.D.   On: 10/01/2023 15:13      Impression / Plan:   Assessment: Principal Problem:   COPD (chronic obstructive pulmonary disease) (HCC) Active Problems:   COPD exacerbation (HCC)   Tobacco abuse   Wendy Arias is a 73 y.o. y/o female with an abnormal CT scan of the chest showing thickening of the esophagus with a questionable mass.  Differential includes a mass versus a benign stricture.  Plan:  The patient will be set up for an EGD.  She will need to have the Plavix stopped for 5 days prior to any GI intervention.  The patient has been explained the plan and agrees with it.  Thank you for involving me in the care of this patient.      LOS: 1 day   Midge Minium, MD, MD. Clementeen Graham 10/03/2023, 2:19 PM,  Pager [phone] 7am-5pm  Check AMION for 5pm -7am coverage and on weekends   Note: This dictation was prepared with Dragon dictation along with smaller phrase technology. Any transcriptional errors that result from  this process are unintentional.

## 2023-10-03 NOTE — Plan of Care (Signed)
   Problem: Education: Goal: Knowledge of General Education information will improve Description Including pain rating scale, medication(s)/side effects and non-pharmacologic comfort measures Outcome: Progressing

## 2023-10-03 NOTE — Progress Notes (Addendum)
 Pt is complaining of chronic back pain 10/10. PRN oxycodone 10 mg PO tablet administered at 1800. MD Mansy made aware. Will continue to monitor.  Update 2015: See new orders. Will continue to monitor.

## 2023-10-03 NOTE — Progress Notes (Addendum)
 PROGRESS NOTE    [name]  UEA:[id number] DOB: [birth year] DOA: 10/01/2023 PCP: Wilford Corner, PA-C  Chief Complaint  Patient presents with   Shortness of Breath    Hospital Course:  Wendy Arias is 73 y.o. female with COPD Gold stage II, chronic heart failure preserved EF, tobacco abuse, CAD status post stenting, chronic pain syndrome, chronic narcotic dependence, who presents with worsening dyspnea and back pain.  Patient endorses productive cough and thick sputum with wheezing and exertional dyspnea.  She reports prior to arrival she developed back pain and rib cage pain worse with coughing and deep breath.  On arrival to the ED she was tachycardic, not hypotensive, and hypoxic to 80% on room air.  Chest x-ray revealed emphysema like changes and new onset of T6 compression fracture and chronic L1 compression fracture.  Patient was given breathing treatments but was persistently dyspneic and thus admitted for further workup.  Subjective: No acute events overnight.  Still on 4 L nasal cannula this morning.  Still complaining of back pain   Objective: Vitals:   10/02/23 2023 10/03/23 0439 10/03/23 0442 10/03/23 0730  BP:  (!) 140/95  (!) 175/80  Pulse:  81  84  Resp:  18  16  Temp:  97.7 F (36.5 C)  97.9 F (36.6 C)  TempSrc:    Oral  SpO2: 97% 94% 98% 95%  Weight:      Height:        Intake/Output Summary (Last 24 hours) at 10/03/2023 0758 Last data filed at 10/03/2023 0554 Gross per 24 hour  Intake --  Output 160 ml  Net -160 ml   Filed Weights   10/01/23 1151  Weight: 57.2 kg    Examination: General exam: Appears calm and comfortable, NAD  Respiratory system: No work of breathing, no wheeze, currently on 3L nasal cannula. Cardiovascular system: S1 & S2 heard, RRR.  Gastrointestinal system: Abdomen is nondistended, soft and nontender.  Neuro: Alert and oriented. No focal neurological deficits. Extremities: Symmetric, expected ROM Skin: No rashes,  lesions Psychiatry: Demonstrates appropriate judgement and insight. Mood & affect appropriate for situation.   Assessment & Plan:  Principal Problem:   COPD (chronic obstructive pulmonary disease) (HCC) Active Problems:   COPD exacerbation (HCC)   Tobacco abuse    Acute hypoxic respiratory failure Acute COPD exacerbation - Was initially started on azithromycin, procalcitonin unremarkable, afebrile without leukocytosis.  Doubt pneumonia.  Discontinue further antibiotics  -- Respiratory viral panel negative - Sputum culture polymicrobial, continue to follow - Continue with prednisone, taper as tolerated - Continue nebulizer treatments - Encourage incentive spirometry and flutter valve - Currently requiring 3L nasal cannula, will wean as tolerated.  Patient is not on home O2 though she may require it at discharge if she is unable to wean entirely.   - Goal O2 sats 88 to 92%.  Esophageal mass Chronic nausea vomiting - CT chest with incidental finding of esophageal thickening.  Concerning for esophageal mass.  This was not seen on October CT - Patient endorses daily vomiting undigested food with solids and liquids, as well as 20 pound weight loss -- Start PPI    - Patient would benefit from EGD this admission when respiratory status stabilizes - Discussed with gastroenterology today, hold Plavix.  Last dose Plavix 4/6  Tobacco abuse - Added smoking cessation - Nicotine patch ordered  T6 compression fracture Osteoporosis - Patient endorses taking vitamin D and calcium outpatient - Will likely need more aggressive therapy.  Needs to follow-up with PCP - As needed pain meds - PT/OT  Hypertension Chronic heart failure with preserved EF - Clinically euvolemic - Resume home meds  CAD -Continue statin - Hold aspirin Plavix for upcoming EGD/biopsy  Chronic pain syndrome Neuropathic pain lower extremity Peripheral vascular disease - History of femoral endarterectomy in March  2023 - Follows outpatient with pain clinic -Have resumed home pain meds for now.  Will increase as needed.  Patient continues to endorse ongoing pain.  DVT prophylaxis: Lovenox   Code Status: Limited: Do not attempt resuscitation (DNR) -DNR-LIMITED -Do Not Intubate/DNI  Family Communication:  Discussed directly with patient, as well as sister at bedside and sister on the phone Disposition: Remains inpatient for supplemental oxygen, pending workup with gastroenterology.  Likely EGD this admission.   Consultants:    Procedures:    Antimicrobials:  Anti-infectives (From admission, onward)    Start     Dose/Rate Route Frequency Ordered Stop   10/01/23 1330  azithromycin (ZITHROMAX) tablet 500 mg        500 mg Oral Daily 10/01/23 1317         Data Reviewed: I have personally reviewed following labs and imaging studies CBC: Recent Labs  Lab 10/01/23 1123  WBC 6.8  NEUTROABS 4.8  HGB 12.9  HCT 42.0  MCV 88.8  PLT 267   Basic Metabolic Panel: Recent Labs  Lab 10/01/23 1123  NA 138  K 4.3  CL 102  CO2 26  GLUCOSE 91  BUN 19  CREATININE 1.00  CALCIUM 9.0   GFR: Estimated Creatinine Clearance: 39.7 mL/min (by C-G formula based on SCr of 1 mg/dL). Liver Function Tests: Recent Labs  Lab 10/01/23 1123  AST 21  ALT 14  ALKPHOS 120  BILITOT 0.7  PROT 7.2  ALBUMIN 4.0   CBG: No results for input(s): "GLUCAP" in the last 168 hours.  Recent Results (from the past 240 hours)  Resp panel by RT-PCR (RSV, Flu A&B, Covid) Anterior Nasal Swab     Status: None   Collection Time: 10/01/23 11:23 AM   Specimen: Anterior Nasal Swab  Result Value Ref Range Status   SARS Coronavirus 2 by RT PCR NEGATIVE NEGATIVE Final    Comment: (NOTE) SARS-CoV-2 target nucleic acids are NOT DETECTED.  The SARS-CoV-2 RNA is generally detectable in upper respiratory specimens during the acute phase of infection. The lowest concentration of SARS-CoV-2 viral copies this assay can detect  is 138 copies/mL. A negative result does not preclude SARS-Cov-2 infection and should not be used as the sole basis for treatment or other patient management decisions. A negative result may occur with  improper specimen collection/handling, submission of specimen other than nasopharyngeal swab, presence of viral mutation(s) within the areas targeted by this assay, and inadequate number of viral copies(<138 copies/mL). A negative result must be combined with clinical observations, patient history, and epidemiological information. The expected result is Negative.  Fact Sheet for Patients:  BloggerCourse.com  Fact Sheet for Healthcare Providers:  SeriousBroker.it  This test is no t yet approved or cleared by the [place] FDA and  has been authorized for detection and/or diagnosis of SARS-CoV-2 by FDA under an Emergency Use Authorization (EUA). This EUA will remain  in effect (meaning this test can be used) for the duration of the COVID-19 declaration under Section 564(b)(1) of the Act, 21 U.S.C.section 360bbb-3(b)(1), unless the authorization is terminated  or revoked sooner.       Influenza A by PCR NEGATIVE NEGATIVE  Final   Influenza B by PCR NEGATIVE NEGATIVE Final    Comment: (NOTE) The Xpert Xpress SARS-CoV-2/FLU/RSV plus assay is intended as an aid in the diagnosis of influenza from Nasopharyngeal swab specimens and should not be used as a sole basis for treatment. Nasal washings and aspirates are unacceptable for Xpert Xpress SARS-CoV-2/FLU/RSV testing.  Fact Sheet for Patients: BloggerCourse.com  Fact Sheet for Healthcare Providers: SeriousBroker.it  This test is not yet approved or cleared by the [place] FDA and has been authorized for detection and/or diagnosis of SARS-CoV-2 by FDA under an Emergency Use Authorization (EUA). This EUA will remain in effect  (meaning this test can be used) for the duration of the COVID-19 declaration under Section 564(b)(1) of the Act, 21 U.S.C. section 360bbb-3(b)(1), unless the authorization is terminated or revoked.     Resp Syncytial Virus by PCR NEGATIVE NEGATIVE Final    Comment: (NOTE) Fact Sheet for Patients: BloggerCourse.com  Fact Sheet for Healthcare Providers: SeriousBroker.it  This test is not yet approved or cleared by the [place] FDA and has been authorized for detection and/or diagnosis of SARS-CoV-2 by FDA under an Emergency Use Authorization (EUA). This EUA will remain in effect (meaning this test can be used) for the duration of the COVID-19 declaration under Section 564(b)(1) of the Act, 21 U.S.C. section 360bbb-3(b)(1), unless the authorization is terminated or revoked.  Performed at [facility] [place], [address] [place], [place] [address]   Respiratory (~20 pathogens) panel by PCR     Status: None   Collection Time: 10/01/23  1:40 PM   Specimen: Nasopharyngeal Swab; Respiratory  Result Value Ref Range Status   Adenovirus NOT DETECTED NOT DETECTED Final   Coronavirus 229E NOT DETECTED NOT DETECTED Final    Comment: (NOTE) The Coronavirus on the Respiratory Panel, DOES NOT test for the novel  Coronavirus (2019 nCoV)    Coronavirus HKU1 NOT DETECTED NOT DETECTED Final   Coronavirus NL63 NOT DETECTED NOT DETECTED Final   Coronavirus OC43 NOT DETECTED NOT DETECTED Final   Metapneumovirus NOT DETECTED NOT DETECTED Final   Rhinovirus / Enterovirus NOT DETECTED NOT DETECTED Final   Influenza A NOT DETECTED NOT DETECTED Final   Influenza B NOT DETECTED NOT DETECTED Final   Parainfluenza Virus 1 NOT DETECTED NOT DETECTED Final   Parainfluenza Virus 2 NOT DETECTED NOT DETECTED Final   Parainfluenza Virus 3 NOT DETECTED NOT DETECTED Final   Parainfluenza Virus 4 NOT DETECTED NOT DETECTED Final   Respiratory Syncytial  Virus NOT DETECTED NOT DETECTED Final   Bordetella pertussis NOT DETECTED NOT DETECTED Final   Bordetella Parapertussis NOT DETECTED NOT DETECTED Final   Chlamydophila pneumoniae NOT DETECTED NOT DETECTED Final   Mycoplasma pneumoniae NOT DETECTED NOT DETECTED Final    Comment: Performed at [facility], [address] [place], [place] [address]  Expectorated Sputum Assessment w Gram Stain, Rflx to Resp Cult     Status: None   Collection Time: 10/01/23  3:36 PM   Specimen: Sputum  Result Value Ref Range Status   Specimen Description SPUTUM  Final   Special Requests NONE  Final   Sputum evaluation   Final    THIS SPECIMEN IS ACCEPTABLE FOR SPUTUM CULTURE Performed at [facility], [address], [place], [place] [address]    Report Status 10/01/2023 FINAL  Final  Culture, Respiratory w Gram Stain     Status: None (Preliminary result)   Collection Time: 10/01/23  3:36 PM  Specimen: SPU  Result Value Ref Range Status   Specimen Description   Final    SPUTUM Performed at [facility], [address] [place], [place] [address]    Special Requests   Final    NONE Reflexed from [phone] Performed at [facility], [address] [place], [place] [address]    Gram Stain   Final    FEW WBC PRESENT, PREDOMINANTLY PMN FEW GRAM POSITIVE COCCI RARE GRAM NEGATIVE RODS RARE GRAM NEGATIVE DIPLOCOCCI Performed at [facility], [address] [place], [place] [address]    Culture PENDING  Incomplete   Report Status PENDING  Incomplete     Radiology Studies: CT Angio Chest Pulmonary Embolism (PE) W or WO Contrast Result Date: 10/01/2023 CLINICAL DATA:  Shortness of breath.  Hypoxia.  Coughing. EXAM: CT ANGIOGRAPHY CHEST WITH CONTRAST TECHNIQUE: Multidetector CT imaging of the chest was performed using the standard protocol during bolus administration of intravenous contrast. Multiplanar CT image reconstructions and MIPs were obtained to evaluate the  vascular anatomy. RADIATION DOSE REDUCTION: This exam was performed according to the departmental dose-optimization program which includes automated exposure control, adjustment of the mA and/or kV according to patient size and/or use of iterative reconstruction technique. CONTRAST:  75mL OMNIPAQUE IOHEXOL 350 MG/ML SOLN COMPARISON:  03/03/2023 FINDINGS: Cardiovascular: Satisfactory opacification of pulmonary arteries noted, and no pulmonary emboli identified. No evidence of thoracic aortic dissection or aneurysm. Mediastinum/Nodes: Mild mediastinal lymphadenopathy shows mild decrease since previous study, with index lymph node in the right paratracheal region measuring 15 mm compared to 19 mm previously. Concentric wall thickening is seen involving the midthoracic esophagus, suspicious for esophageal mass. Mild dilatation and debris is seen in the upper thoracic esophagus. Lungs/Pleura: Previously seen left lower lobe airspace disease has resolved since previous study. Bilateral pleural-parenchymal scarring is again noted. Mild emphysema again seen. No evidence of pulmonary consolidation, mass, or pleural effusion. Upper abdomen: No acute findings. Musculoskeletal: No suspicious bone lesions identified. Old compression fracture deformity of the L1 vertebral body again noted. Review of the MIP images confirms the above findings. IMPRESSION: No evidence of pulmonary embolism. Concentric wall thickening of midthoracic esophagus, with proximal esophageal dilatation. This is suspicious for esophageal mass. Mild mediastinal lymphadenopathy, mildly decreased since previous study. Interval resolution of previously seen left lower lobe airspace disease. Emphysema (ICD10-J43.9) and bilateral pleural-parenchymal scarring. Electronically Signed   By: Danae Orleans M.D.   On: 10/01/2023 15:13   DG Chest 2 View Result Date: 10/01/2023 CLINICAL DATA:  73 year old female with shortness of breath, hypoxia. EXAM: CHEST - 2 VIEW  COMPARISON:  CTA chest 03/03/2023 and earlier. FINDINGS: AP seated and lateral views 1138 hours. Chronically large lung volumes, some emphysema demonstrated by CTA last year. Chronic tortuosity of the thoracic aorta, mediastinal contours are stable. Coarse, symmetric interstitial markings are stable. No pneumothorax, pulmonary edema, pleural effusion or confluent lung opacity identified. Osteopenia. Chronic L1 compression fracture. A T6 compression fracture is new since last year, age indeterminate. Negative visible bowel gas. Calcified aortic atherosclerosis. IMPRESSION: 1. Chronic lung disease with evidence hyperinflation. No acute cardiopulmonary abnormality. 2. Osteopenia with a T6 compression fracture new since last September, age indeterminate. Chronic L1 compression fracture. Electronically Signed   By: Odessa Fleming M.D.   On: 10/01/2023 11:59    Scheduled Meds:  acetaminophen  650 mg Oral Q8H   amitriptyline  100 mg Oral QHS   aspirin EC  81 mg Oral Daily   atorvastatin  40 mg  Oral Daily   azithromycin  500 mg Oral Daily   clopidogrel  75 mg Oral Daily   enoxaparin (LOVENOX) injection  40 mg Subcutaneous Q24H   feeding supplement  237 mL Oral BID BM   fluticasone furoate-vilanterol  1 puff Inhalation Daily   furosemide  20 mg Oral Daily   gabapentin  600 mg Oral QHS   guaiFENesin  600 mg Oral BID   ipratropium-albuterol  3 mL Nebulization Q4H   lidocaine  1 patch Transdermal Q24H   nicotine  21 mg Transdermal Daily   predniSONE  40 mg Oral Q breakfast   Continuous Infusions:   LOS: 1 day  MDM: Patient is high risk for one or more organ failure.  They necessitate ongoing hospitalization for continued IV therapies and subsequent lab monitoring. Total time spent interpreting labs and vitals, coordinating care amongst consultants and care team members, directly assessing and discussing care with the patient and/or family: 55 min    Debarah Crape, DO [facility]  To contact the  attending physician between 7A-7P please use Epic Chat. To contact the covering physician during after hours 7P-7A, please review Amion.   10/03/2023, 7:58 AM   *This document has been created with the assistance of dictation software. Please excuse typographical errors. *

## 2023-10-04 DIAGNOSIS — Z720 Tobacco use: Secondary | ICD-10-CM | POA: Diagnosis not present

## 2023-10-04 DIAGNOSIS — J441 Chronic obstructive pulmonary disease with (acute) exacerbation: Secondary | ICD-10-CM | POA: Diagnosis not present

## 2023-10-04 LAB — COMPREHENSIVE METABOLIC PANEL WITH GFR
ALT: 14 U/L (ref 0–44)
AST: 15 U/L (ref 15–41)
Albumin: 3.3 g/dL — ABNORMAL LOW (ref 3.5–5.0)
Alkaline Phosphatase: 95 U/L (ref 38–126)
Anion gap: 8 (ref 5–15)
BUN: 32 mg/dL — ABNORMAL HIGH (ref 8–23)
CO2: 28 mmol/L (ref 22–32)
Calcium: 8.8 mg/dL — ABNORMAL LOW (ref 8.9–10.3)
Chloride: 102 mmol/L (ref 98–111)
Creatinine, Ser: 1.02 mg/dL — ABNORMAL HIGH (ref 0.44–1.00)
GFR, Estimated: 58 mL/min — ABNORMAL LOW (ref >60)
Glucose, Bld: 96 mg/dL (ref 70–99)
Potassium: 4.2 mmol/L (ref 3.5–5.1)
Sodium: 138 mmol/L (ref 135–145)
Total Bilirubin: 0.5 mg/dL (ref 0.0–1.2)
Total Protein: 6.3 g/dL — ABNORMAL LOW (ref 6.5–8.1)

## 2023-10-04 LAB — CBC
HCT: 37.1 % (ref 36.0–46.0)
Hemoglobin: 11.8 g/dL — ABNORMAL LOW (ref 12.0–15.0)
MCH: 27.1 pg (ref 26.0–34.0)
MCHC: 31.8 g/dL (ref 30.0–36.0)
MCV: 85.1 fL (ref 80.0–100.0)
Platelets: 268 10*3/uL (ref 150–400)
RBC: 4.36 MIL/uL (ref 3.87–5.11)
RDW: 22.1 % — ABNORMAL HIGH (ref 11.5–15.5)
WBC: 8.7 10*3/uL (ref 4.0–10.5)
nRBC: 0 % (ref 0.0–0.2)

## 2023-10-04 LAB — CULTURE, RESPIRATORY W GRAM STAIN

## 2023-10-04 MED ORDER — BACLOFEN 10 MG PO TABS
10.0000 mg | ORAL_TABLET | Freq: Three times a day (TID) | ORAL | Status: DC
  Administered 2023-10-04 – 2023-10-06 (×7): 10 mg via ORAL
  Filled 2023-10-04 (×7): qty 1

## 2023-10-04 MED ORDER — IPRATROPIUM-ALBUTEROL 0.5-2.5 (3) MG/3ML IN SOLN
3.0000 mL | Freq: Four times a day (QID) | RESPIRATORY_TRACT | Status: DC
  Administered 2023-10-04 – 2023-10-05 (×3): 3 mL via RESPIRATORY_TRACT
  Filled 2023-10-04 (×4): qty 3

## 2023-10-04 NOTE — Progress Notes (Signed)
 PROGRESS NOTE    Wendy Arias  ZOX:[id number] DOB: [birth year] DOA: 10/01/2023 PCP: Wilford Corner, PA-C  Chief Complaint  Patient presents with   Shortness of Breath    Hospital Course:  Wendy Arias is 73 y.o. female with COPD Gold stage II, chronic heart failure preserved EF, tobacco abuse, CAD status post stenting, chronic pain syndrome, chronic narcotic dependence, who presents with worsening dyspnea and back pain.  Patient endorses productive cough and thick sputum with wheezing and exertional dyspnea.  She reports prior to arrival she developed back pain and rib cage pain worse with coughing and deep breath.  On arrival to the ED she was tachycardic, not hypotensive, and hypoxic to 80% on room air.  Chest x-ray revealed emphysema like changes and new onset of T6 compression fracture and chronic L1 compression fracture.  Patient was given breathing treatments but was persistently dyspneic and thus admitted for further workup.  Subjective: Patient continues to complain of pain this morning.  She reports the pain is over her left scapula.  Is very tender to palpation.  Denies any new trauma.  Labs and vitals remained stable.  Continues to wean oxygen   Objective: Vitals:   10/04/23 0437 10/04/23 0708 10/04/23 0731 10/04/23 0820  BP: (!) 139/90  (!) 123/101   Pulse: 71  92   Resp: 18  17   Temp: 98.2 F (36.8 C)  98.2 F (36.8 C)   TempSrc: Oral     SpO2: 97% 94% (!) 89% 93%  Weight:      Height:        Intake/Output Summary (Last 24 hours) at 10/04/2023 0851 Last data filed at 10/03/2023 2200 Gross per 24 hour  Intake 120 ml  Output 1950 ml  Net -1830 ml   Filed Weights   10/01/23 1151  Weight: 57.2 kg    Examination: General exam: Appears calm and comfortable, NAD  Respiratory system: No work of breathing, no wheeze, currently on 3L nasal cannula. Cardiovascular system: S1 & S2 heard, RRR.  Gastrointestinal system: Abdomen is nondistended, soft and nontender.   Neuro: Alert and oriented. No focal neurological deficits. Extremities: Symmetric, expected ROM Skin: No rashes, lesions Psychiatry: Demonstrates appropriate judgement and insight. Mood & affect appropriate for situation.   Assessment & Plan:  Principal Problem:   COPD (chronic obstructive pulmonary disease) (HCC) Active Problems:   COPD exacerbation (HCC)   Tobacco abuse   Esophageal thickening   Abdominal pain, chronic, epigastric    Acute hypoxic respiratory failure Acute COPD exacerbation - Was initially started on azithromycin, procalcitonin unremarkable, afebrile without leukocytosis.  Doubt pneumonia.  Discontinued further antibiotics -- Respiratory viral panel negative - Sputum cultures with Moraxella catarrhalis.  Likely colonization. - Continue prednisone, taper as tolerated - Continue nebulizer treatments - Continue to encourage incentive spirometry and flutter valve - Still requiring supplemental O2.  Wean as tolerated. - Patient is not on home O2 though given the degree of her emphysema she may require it at discharge.  Esophageal mass Chronic nausea vomiting - CT chest with incidental finding of esophageal thickening.  Concerning for esophageal mass.  This was not seen on October CT - Patient endorses daily vomiting undigested food with solids and liquids, as well as 20 pound weight loss -Have started PPI on this admission. - Discussed with gastroenterology. hold Plavix.  Last dose Plavix 4/6.  GI tentatively planning for EGD on Thursday/Friday.  Tobacco abuse -Counseled on smoking cessation, patient resistant to discontinuation. - Nicotine  patch ordered  T6 compression fracture Osteoporosis - Patient endorses taking vitamin D and calcium outpatient - Will likely need more aggressive therapy.  Needs to follow-up with PCP -Continue with as needed pain meds - PT/OT  Hypertension Chronic heart failure with preserved EF - Clinically euvolemic - Resume home  meds  CAD -Continue statin - Hold aspirin Plavix for upcoming EGD/biopsy  Chronic pain syndrome Neuropathic pain lower extremity Peripheral vascular disease - History of femoral endarterectomy in March 2023 - Follows outpatient with pain clinic - Pain remains difficult to control.  Continue to titrate meds as needed.  Multimodal pain plan, add baclofen today.  DVT prophylaxis: Lovenox   Code Status: Limited: Do not attempt resuscitation (DNR) -DNR-LIMITED -Do Not Intubate/DNI  Family Communication:  Discussed directly with patient, as well as sister at bedside and sister on the phone Disposition: Remains inpatient for supplemental oxygen, pending workup with gastroenterology.  Likely EGD this admission.   Consultants:  Treatment Team:  Consulting Physician: Midge Minium, MD  Procedures:    Antimicrobials:  Anti-infectives (From admission, onward)    Start     Dose/Rate Route Frequency Ordered Stop   10/01/23 1330  azithromycin (ZITHROMAX) tablet 500 mg  Status:  Discontinued        500 mg Oral Daily 10/01/23 1317 10/03/23 0759       Data Reviewed: I have personally reviewed following labs and imaging studies CBC: Recent Labs  Lab 10/01/23 1123 10/04/23 0327  WBC 6.8 8.7  NEUTROABS 4.8  --   HGB 12.9 11.8*  HCT 42.0 37.1  MCV 88.8 85.1  PLT 267 268   Basic Metabolic Panel: Recent Labs  Lab 10/01/23 1123 10/04/23 0327  NA 138 138  K 4.3 4.2  CL 102 102  CO2 26 28  GLUCOSE 91 96  BUN 19 32*  CREATININE 1.00 1.02*  CALCIUM 9.0 8.8*   GFR: Estimated Creatinine Clearance: 38.9 mL/min (A) (by C-G formula based on SCr of 1.02 mg/dL (H)). Liver Function Tests: Recent Labs  Lab 10/01/23 1123 10/04/23 0327  AST 21 15  ALT 14 14  ALKPHOS 120 95  BILITOT 0.7 0.5  PROT 7.2 6.3*  ALBUMIN 4.0 3.3*   CBG: No results for input(s): "GLUCAP" in the last 168 hours.  Recent Results (from the past 240 hours)  Resp panel by RT-PCR (RSV, Flu A&B, Covid) Anterior  Nasal Swab     Status: None   Collection Time: 10/01/23 11:23 AM   Specimen: Anterior Nasal Swab  Result Value Ref Range Status   SARS Coronavirus 2 by RT PCR NEGATIVE NEGATIVE Final    Comment: (NOTE) SARS-CoV-2 target nucleic acids are NOT DETECTED.  The SARS-CoV-2 RNA is generally detectable in upper respiratory specimens during the acute phase of infection. The lowest concentration of SARS-CoV-2 viral copies this assay can detect is 138 copies/mL. A negative result does not preclude SARS-Cov-2 infection and should not be used as the sole basis for treatment or other patient management decisions. A negative result may occur with  improper specimen collection/handling, submission of specimen other than nasopharyngeal swab, presence of viral mutation(s) within the areas targeted by this assay, and inadequate number of viral copies(<138 copies/mL). A negative result must be combined with clinical observations, patient history, and epidemiological information. The expected result is Negative.  Fact Sheet for Patients:  BloggerCourse.com  Fact Sheet for Healthcare Providers:  SeriousBroker.it  This test is no t yet approved or cleared by the [place] and  has been authorized for detection and/or diagnosis of SARS-CoV-2 by FDA under an Emergency Use Authorization (EUA). This EUA will remain  in effect (meaning this test can be used) for the duration of the COVID-19 declaration under Section 564(b)(1) of the Act, 21 U.S.C.section 360bbb-3(b)(1), unless the authorization is terminated  or revoked sooner.       Influenza A by PCR NEGATIVE NEGATIVE Final   Influenza B by PCR NEGATIVE NEGATIVE Final    Comment: (NOTE) The Xpert Xpress SARS-CoV-2/FLU/RSV plus assay is intended as an aid in the diagnosis of influenza from Nasopharyngeal swab specimens and should not be used as a sole basis for treatment. Nasal washings  and aspirates are unacceptable for Xpert Xpress SARS-CoV-2/FLU/RSV testing.  Fact Sheet for Patients: BloggerCourse.com  Fact Sheet for Healthcare Providers: SeriousBroker.it  This test is not yet approved or cleared by the [place] FDA and has been authorized for detection and/or diagnosis of SARS-CoV-2 by FDA under an Emergency Use Authorization (EUA). This EUA will remain in effect (meaning this test can be used) for the duration of the COVID-19 declaration under Section 564(b)(1) of the Act, 21 U.S.C. section 360bbb-3(b)(1), unless the authorization is terminated or revoked.     Resp Syncytial Virus by PCR NEGATIVE NEGATIVE Final    Comment: (NOTE) Fact Sheet for Patients: BloggerCourse.com  Fact Sheet for Healthcare Providers: SeriousBroker.it  This test is not yet approved or cleared by the [place] FDA and has been authorized for detection and/or diagnosis of SARS-CoV-2 by FDA under an Emergency Use Authorization (EUA). This EUA will remain in effect (meaning this test can be used) for the duration of the COVID-19 declaration under Section 564(b)(1) of the Act, 21 U.S.C. section 360bbb-3(b)(1), unless the authorization is terminated or revoked.  Performed at [facility], [address] [place], [place] [address]   Respiratory (~20 pathogens) panel by PCR     Status: None   Collection Time: 10/01/23  1:40 PM   Specimen: Nasopharyngeal Swab; Respiratory  Result Value Ref Range Status   Adenovirus NOT DETECTED NOT DETECTED Final   Coronavirus 229E NOT DETECTED NOT DETECTED Final    Comment: (NOTE) The Coronavirus on the Respiratory Panel, DOES NOT test for the novel  Coronavirus (2019 nCoV)    Coronavirus HKU1 NOT DETECTED NOT DETECTED Final   Coronavirus NL63 NOT DETECTED NOT DETECTED Final   Coronavirus OC43 NOT DETECTED NOT DETECTED Final    Metapneumovirus NOT DETECTED NOT DETECTED Final   Rhinovirus / Enterovirus NOT DETECTED NOT DETECTED Final   Influenza A NOT DETECTED NOT DETECTED Final   Influenza B NOT DETECTED NOT DETECTED Final   Parainfluenza Virus 1 NOT DETECTED NOT DETECTED Final   Parainfluenza Virus 2 NOT DETECTED NOT DETECTED Final   Parainfluenza Virus 3 NOT DETECTED NOT DETECTED Final   Parainfluenza Virus 4 NOT DETECTED NOT DETECTED Final   Respiratory Syncytial Virus NOT DETECTED NOT DETECTED Final   Bordetella pertussis NOT DETECTED NOT DETECTED Final   Bordetella Parapertussis NOT DETECTED NOT DETECTED Final   Chlamydophila pneumoniae NOT DETECTED NOT DETECTED Final   Mycoplasma pneumoniae NOT DETECTED NOT DETECTED Final    Comment: Performed at [facility], [address] [place], [place] [address]  Expectorated Sputum Assessment w Gram Stain, Rflx to Resp Cult     Status: None   Collection Time: 10/01/23  3:36 PM   Specimen: Sputum  Result Value Ref Range Status   Specimen Description SPUTUM  Final  Special Requests NONE  Final   Sputum evaluation   Final    THIS SPECIMEN IS ACCEPTABLE FOR SPUTUM CULTURE Performed at [facility], [address] [place], [place] [address]    Report Status 10/01/2023 FINAL  Final  Culture, Respiratory w Gram Stain     Status: None (Preliminary result)   Collection Time: 10/01/23  3:36 PM   Specimen: SPU  Result Value Ref Range Status   Specimen Description   Final    SPUTUM Performed at [facility], [address] [place], [place] [address]    Special Requests   Final    NONE Reflexed from [phone] Performed at [facility], [address] [place], [place] [address]    Gram Stain   Final    FEW WBC PRESENT, PREDOMINANTLY PMN FEW GRAM POSITIVE COCCI RARE GRAM NEGATIVE RODS RARE GRAM NEGATIVE DIPLOCOCCI    Culture   Final    ABUNDANT MORAXELLA CATARRHALIS(BRANHAMELLA) BETA LACTAMASE POSITIVE Performed at [facility], [address], [place], [place] [address]    Report Status PENDING  Incomplete     Radiology Studies: No results found.   Scheduled Meds:  acetaminophen  650 mg Oral Q8H   amitriptyline  100 mg Oral QHS   atorvastatin  40 mg Oral Daily   enoxaparin (LOVENOX) injection  40 mg Subcutaneous Q24H   feeding supplement  237 mL Oral BID BM   fluticasone furoate-vilanterol  1 puff Inhalation Daily   furosemide  20 mg Oral Daily   gabapentin  600 mg Oral QHS   guaiFENesin  600 mg Oral BID   ipratropium-albuterol  3 mL Nebulization Q6H   lidocaine  1 patch Transdermal Q24H   nicotine  21 mg Transdermal Daily   pantoprazole  40 mg Oral Daily   predniSONE  40 mg Oral Q breakfast   Continuous Infusions:   LOS: 2 days  MDM: Patient is high risk for one or more organ failure.  They necessitate ongoing hospitalization for continued IV therapies and subsequent lab monitoring. Total time spent interpreting labs and vitals, coordinating care amongst consultants and care team members, directly assessing and discussing care with the patient and/or family: 55 min    Debarah Crape, DO [facility]  To contact the attending physician between 7A-7P please use Epic Chat. To contact the covering physician during after hours 7P-7A, please review Amion.   10/04/2023, 8:51 AM   *This document has been created with the assistance of dictation software. Please excuse typographical errors. *

## 2023-10-04 NOTE — Plan of Care (Signed)
  Problem: Health Behavior/Discharge Planning: Goal: Ability to manage health-related needs will improve Outcome: Progressing   Problem: Clinical Measurements: Goal: Respiratory complications will improve Outcome: Progressing   Problem: Clinical Measurements: Goal: Cardiovascular complication will be avoided Outcome: Progressing   Problem: Pain Managment: Goal: General experience of comfort will improve and/or be controlled Outcome: Progressing   Problem: Safety: Goal: Ability to remain free from injury will improve Outcome: Progressing

## 2023-10-04 NOTE — Plan of Care (Signed)

## 2023-10-05 ENCOUNTER — Other Ambulatory Visit: Payer: Self-pay

## 2023-10-05 DIAGNOSIS — J441 Chronic obstructive pulmonary disease with (acute) exacerbation: Secondary | ICD-10-CM | POA: Diagnosis not present

## 2023-10-05 DIAGNOSIS — R933 Abnormal findings on diagnostic imaging of other parts of digestive tract: Secondary | ICD-10-CM

## 2023-10-05 MED ORDER — IPRATROPIUM-ALBUTEROL 0.5-2.5 (3) MG/3ML IN SOLN: 3.0000 mL | Freq: Two times a day (BID) | RESPIRATORY_TRACT | Status: DC

## 2023-10-05 MED ORDER — SODIUM CHLORIDE 3 % IN NEBU
4.0000 mL | INHALATION_SOLUTION | Freq: Two times a day (BID) | RESPIRATORY_TRACT | Status: DC
  Administered 2023-10-05 – 2023-10-06 (×2): 4 mL via RESPIRATORY_TRACT
  Filled 2023-10-05 (×3): qty 4

## 2023-10-05 MED ORDER — IPRATROPIUM-ALBUTEROL 0.5-2.5 (3) MG/3ML IN SOLN
3.0000 mL | Freq: Two times a day (BID) | RESPIRATORY_TRACT | Status: DC
  Administered 2023-10-05 – 2023-10-06 (×2): 3 mL via RESPIRATORY_TRACT
  Filled 2023-10-05 (×2): qty 3

## 2023-10-05 NOTE — Discharge Instructions (Signed)
 Food Resources  Agency Name: [facility] Agency Address: [address] [place], [place] [address] Phone: [phone] Website: [email or web address] Service(s) Offered: Housing services, self-sufficiency, congregate meal program, weatherization program, Event organiser program, emergency food assistance,  housing counseling, home ownership program, wheels - to work program.  [employer] free for 60 and older at various locations from [employer], Monday-Friday:  [employer], [address] [place], [phone] -[facility], [address], [place] [phone]  -[facility], [address], [place] [phone]  -[address], [address], [place], [phone]  Agency Name: [facility] Address: [phone] [address], [place], [place] [address] Phone: [phone] Website: [email or web address] Service(s) Offered: Home delivered hot, frozen, and emergency  meals. Grocery assistance program which matches  volunteers one-on-one with seniors unable to grocery shop  for themselves. Must be 60 years and older; less than 20  hours of in-home aide service, limited or no driving ability;  live alone or with someone with a disability; live in  [place].  Agency Name: [employer] [facility] [employer]) Address: [address] [place], [place] [address] Phone: [phone] Service(s) Offered: Food is served to shut-ins, homeless, elderly, and low income people in the community every Saturday (11:30 am-12:30 pm) and Sunday (12:30 pm-1:30pm). Volunteers also offer help and encouragement in seeking employment,  and spiritual guidance.  Agency Name: [employer] Address: [address] [place], [place] [address] Phone: [phone] Service(s) Offered: Child support services; child welfare services; food stamps; Medicaid; work first family assistance; and aid  with fuel,  rent, food and medicine.  Agency Name: [employer] Address: [address] [place], [place] Phone: [phone] Website: [email or web address] Services Offered: Monday 10:00am-12:00, 8:00pm-9:00pm, and Friday 10:00am-12:00.  Agency Name: [employer] [facility] [place] Address: [address], [place], [place] [address] Phone: [phone] Website: [email or web address] Service(s) Offered: Serves weekday meals, open from 11:30 am- 1:00 pm., and 6:30-7:30pm, Monday-Wednesday-Friday distributes food 3:30-6pm, Monday-Wednesday-Friday.  Agency Name: [facility] Address: [address] [place], [place] Phone: [phone] Website: [email or web address] Services Offered: Distributes food the 4th Saturday of the month, starting at 8:00 am  Agency Name: [facility] Address: [phone] [address] [place], [place] [address] Phone: [phone] Website: [email or web address]://hbc..net Service(s) Offered: Bread of life, weekly food pantry. Open Wednesdays from 10:00am-noon.  Agency Name: [facility] [employer] Address: [address] [place], [place], [place] Phone: [phone] Services Offered: Distributes food 9am-1pm, Monday-Thursday. Call for details.  Agency Name: [facility] Address: [address], [place], [place] [address] Phone: [phone] Website: [email or web address] Service(s) Offered: Games developer. Call for assistance.  Agency Name: [facility] Address: [address] [place], [place] [address] Phone: [phone] Service Offered: Emergency Food Pantry. Call for appointment.  Agency Name: [facility] Address: [address], [place], [place] [address] Phone: [phone] Website: [email or web address] Services Offered: Games developer. Call for details  Agency Name: [facility] Address: [address] [place], [place] Phone:  [phone] Website: [email or web address] Service(s) Offered: Emergency Food Pantry. Call for details.  Agency Name: [employer] Address: [address] [place], [place] [address] Phone: [phone] or [phone] Website: [email or web address] Service(s) Offered: Distribute food 9am-11:30 am, Tuesday-Friday, and 1-3:30pm, Monday-Friday. Food pantry Monday-Friday 1pm-3pm, fresh items, Mon.-Wed.-Fri.  Agency Name: [facility] Address: [address] [place], [place] [address] Phone: [phone] Website: [email or web address] Services Offered: Distribute food Tues and Sats from 9:00am-noon.  Closed 1st Saturday of each month. Call for details  Agency Name: [employer] Address: [address] [facility] [address] [place] [address] Phone: [phone]  Services Offered: Delivers meals every Thursday   Agency Name: [facility] Agency Address: [address] [place], [place] [address] Phone: [phone] Website: [email or web address] Service(s) Offered: Housing services, self-sufficiency, congregate meal program, and individual development account program.  Agency Name: [employer] [facility] [place] Address: [address] [place], [place] [address] Phone: [phone] Email: [email or web address] Website: [email or web address] Service(s) Offered: Housing the homeless, feeding the hungry, Company secretary, job and education related services.  Agency Name: [facility] Address: [address], [place], [place] [address] Phone: [phone] Email: [email or web address] Service(s) Offered: Counseling, problem pregnancy, advocacy for Hispanics, limited emergency financial assistance.  Agency Name: Department [employer] Social Services Address: [address] [place], [place] [address] Phone: [phone] Website: [email or web address] Service(s) Offered: Child  support services; child welfare services; SNAP; Medicaid; work first family assistance; and aid with fuel,  rent, food and medicine.  Agency Name: [employer] Address: [address], [place], [place] [address] Phone: [phone] or [phone] Email: [email or web address] Service(s) Offered: Family services and transient assistance; emergency food, fuel, clothing, limited furniture, utilities; budget counseling, general counseling; give a kid a coat; thrift store; Christmas food and toys. Utility assistance, food pantry, rental  assistance, life sustaining medicine

## 2023-10-05 NOTE — Plan of Care (Signed)

## 2023-10-05 NOTE — Progress Notes (Signed)
 Midge Minium, MD [facility]   [address] [place], [place] [address] Phone: [phone] Fax : [phone]   Subjective: This patient was seen this morning with only minimal improvement in her respiratory status.  The patient has been told about the need for an upper endoscopy and the need to wait for her Plavix to wear off.  She is not having any GI issues at the present time but she does report back pain.   Objective: Vital signs in last 24 hours: Vitals:   10/04/23 2129 10/05/23 0412 10/05/23 0727 10/05/23 0751  BP: (!) 139/90 (!) 158/101 (!) 153/114   Pulse: 76 78 69   Resp: 15 18 16    Temp: 97.8 F (36.6 C) 97.7 F (36.5 C) 97.9 F (36.6 C)   TempSrc: Oral Oral Oral   SpO2: 90% 93% 98% 96%  Weight:      Height:       Weight change:   Intake/Output Summary (Last 24 hours) at 10/05/2023 0844 Last data filed at 10/04/2023 1100 Gross per 24 hour  Intake 0 ml  Output 600 ml  Net -600 ml     Exam: General: The patient laying in bed in obvious discomfort from her back pain   Lab Results: @LABTEST2 @ Micro Results: Recent Results (from the past 240 hours)  Resp panel by RT-PCR (RSV, Flu A&B, Covid) Anterior Nasal Swab     Status: None   Collection Time: 10/01/23 11:23 AM   Specimen: Anterior Nasal Swab  Result Value Ref Range Status   SARS Coronavirus 2 by RT PCR NEGATIVE NEGATIVE Final    Comment: (NOTE) SARS-CoV-2 target nucleic acids are NOT DETECTED.  The SARS-CoV-2 RNA is generally detectable in upper respiratory specimens during the acute phase of infection. The lowest concentration of SARS-CoV-2 viral copies this assay can detect is 138 copies/mL. A negative result does not preclude SARS-Cov-2 infection and should not be used as the sole basis for treatment or other patient management decisions. A negative result may occur with  improper specimen collection/handling, submission of specimen other than nasopharyngeal swab, presence of viral  mutation(s) within the areas targeted by this assay, and inadequate number of viral copies(<138 copies/mL). A negative result must be combined with clinical observations, patient history, and epidemiological information. The expected result is Negative.  Fact Sheet for Patients:  BloggerCourse.com  Fact Sheet for Healthcare Providers:  SeriousBroker.it  This test is no t yet approved or cleared by the [place] FDA and  has been authorized for detection and/or diagnosis of SARS-CoV-2 by FDA under an Emergency Use Authorization (EUA). This EUA will remain  in effect (meaning this test can be used) for the duration of the COVID-19 declaration under Section 564(b)(1) of the Act, 21 U.S.C.section 360bbb-3(b)(1), unless the authorization is terminated  or revoked sooner.       Influenza A by PCR NEGATIVE NEGATIVE Final   Influenza B by PCR NEGATIVE NEGATIVE Final    Comment: (NOTE) The Xpert Xpress SARS-CoV-2/FLU/RSV plus assay is intended as an aid in the diagnosis of influenza from Nasopharyngeal swab specimens and should not be used as a sole basis for treatment. Nasal washings and aspirates are unacceptable for Xpert Xpress SARS-CoV-2/FLU/RSV testing.  Fact Sheet for Patients: BloggerCourse.com  Fact Sheet for Healthcare Providers: SeriousBroker.it  This test is not yet approved or cleared by the [place] FDA and has been authorized for detection and/or diagnosis of SARS-CoV-2 by FDA under an Emergency Use Authorization (EUA). This  EUA will remain in effect (meaning this test can be used) for the duration of the COVID-19 declaration under Section 564(b)(1) of the Act, 21 U.S.C. section 360bbb-3(b)(1), unless the authorization is terminated or revoked.     Resp Syncytial Virus by PCR NEGATIVE NEGATIVE Final    Comment: (NOTE) Fact Sheet for  Patients: BloggerCourse.com  Fact Sheet for Healthcare Providers: SeriousBroker.it  This test is not yet approved or cleared by the [place] FDA and has been authorized for detection and/or diagnosis of SARS-CoV-2 by FDA under an Emergency Use Authorization (EUA). This EUA will remain in effect (meaning this test can be used) for the duration of the COVID-19 declaration under Section 564(b)(1) of the Act, 21 U.S.C. section 360bbb-3(b)(1), unless the authorization is terminated or revoked.  Performed at [facility], [address] [place], [place] [address]   Respiratory (~20 pathogens) panel by PCR     Status: None   Collection Time: 10/01/23  1:40 PM   Specimen: Nasopharyngeal Swab; Respiratory  Result Value Ref Range Status   Adenovirus NOT DETECTED NOT DETECTED Final   Coronavirus 229E NOT DETECTED NOT DETECTED Final    Comment: (NOTE) The Coronavirus on the Respiratory Panel, DOES NOT test for the novel  Coronavirus (2019 nCoV)    Coronavirus HKU1 NOT DETECTED NOT DETECTED Final   Coronavirus NL63 NOT DETECTED NOT DETECTED Final   Coronavirus OC43 NOT DETECTED NOT DETECTED Final   Metapneumovirus NOT DETECTED NOT DETECTED Final   Rhinovirus / Enterovirus NOT DETECTED NOT DETECTED Final   Influenza A NOT DETECTED NOT DETECTED Final   Influenza B NOT DETECTED NOT DETECTED Final   Parainfluenza Virus 1 NOT DETECTED NOT DETECTED Final   Parainfluenza Virus 2 NOT DETECTED NOT DETECTED Final   Parainfluenza Virus 3 NOT DETECTED NOT DETECTED Final   Parainfluenza Virus 4 NOT DETECTED NOT DETECTED Final   Respiratory Syncytial Virus NOT DETECTED NOT DETECTED Final   Bordetella pertussis NOT DETECTED NOT DETECTED Final   Bordetella Parapertussis NOT DETECTED NOT DETECTED Final   Chlamydophila pneumoniae NOT DETECTED NOT DETECTED Final   Mycoplasma pneumoniae NOT DETECTED NOT DETECTED Final    Comment: Performed at  [facility], [address] [place], [place] [address]  Expectorated Sputum Assessment w Gram Stain, Rflx to Resp Cult     Status: None   Collection Time: 10/01/23  3:36 PM   Specimen: Sputum  Result Value Ref Range Status   Specimen Description SPUTUM  Final   Special Requests NONE  Final   Sputum evaluation   Final    THIS SPECIMEN IS ACCEPTABLE FOR SPUTUM CULTURE Performed at [facility], [address], [place], [place] [address]    Report Status 10/01/2023 FINAL  Final  Culture, Respiratory w Gram Stain     Status: None   Collection Time: 10/01/23  3:36 PM   Specimen: SPU  Result Value Ref Range Status   Specimen Description   Final    SPUTUM Performed at [facility], [address], [place], [place] [address]    Special Requests   Final    NONE Reflexed from [id number] Performed at [facility], [address] [place], [place] [address]    Gram Stain   Final    FEW WBC PRESENT, PREDOMINANTLY PMN FEW GRAM POSITIVE COCCI RARE GRAM NEGATIVE RODS RARE GRAM NEGATIVE DIPLOCOCCI    Culture   Final    ABUNDANT MORAXELLA CATARRHALIS(BRANHAMELLA) BETA LACTAMASE POSITIVE Performed at [facility], [address] [place], [place] [address]    Report Status 10/04/2023 FINAL  Final   Studies/Results: No results found. Medications: I have reviewed the patient's current medications. Scheduled Meds:  acetaminophen  650 mg Oral Q8H   amitriptyline  100 mg Oral QHS   atorvastatin  40 mg Oral Daily   baclofen  10 mg Oral TID   enoxaparin (LOVENOX) injection  40 mg Subcutaneous Q24H   feeding supplement  237 mL Oral BID BM   fluticasone furoate-vilanterol  1 puff Inhalation Daily   furosemide  20 mg Oral Daily   gabapentin  600 mg Oral QHS   guaiFENesin  600 mg Oral BID   ipratropium-albuterol  3 mL Nebulization Q6H   lidocaine  1 patch Transdermal Q24H   nicotine  21 mg Transdermal Daily   pantoprazole  40 mg Oral Daily   predniSONE   40 mg Oral Q breakfast   Continuous Infusions: PRN Meds:.albuterol, benzonatate, HYDROcodone-acetaminophen, morphine injection, ondansetron **OR** ondansetron (ZOFRAN) IV, oxyCODONE, senna-docusate   Assessment: Principal Problem:   COPD (chronic obstructive pulmonary disease) (HCC) Active Problems:   COPD exacerbation (HCC)   Tobacco abuse   Esophageal thickening   Abdominal pain, chronic, epigastric    Plan: This patient will be set up for an EGD for tomorrow due to the concentric wall thickening in the mid thoracic esophagus with proximal dilation suggestive of a possible esophageal mass.  The patient and her sister have been explained the plan and agree with it.   LOS: 3 days   Midge Minium, MD.FACG 10/05/2023, 8:44 AM Pager [phone] 7am-5pm  Check AMION for 5pm -7am coverage and on weekends

## 2023-10-05 NOTE — Plan of Care (Signed)
 Pt showered today.  Pt has BM.  Pt doing well.  Pain under control with prn meds.  Pt npo tonight for EGD in the morning to check an abnormal CT of esophagus and dysphagia problems.  Problem: Education: Goal: Knowledge of General Education information will improve Description: Including pain rating scale, medication(s)/side effects and non-pharmacologic comfort measures Outcome: Progressing   Problem: Health Behavior/Discharge Planning: Goal: Ability to manage health-related needs will improve Outcome: Progressing   Problem: Clinical Measurements: Goal: Ability to maintain clinical measurements within normal limits will improve Outcome: Progressing Goal: Will remain free from infection Outcome: Progressing Goal: Diagnostic test results will improve Outcome: Progressing Goal: Respiratory complications will improve Outcome: Progressing Goal: Cardiovascular complication will be avoided Outcome: Progressing   Problem: Activity: Goal: Risk for activity intolerance will decrease Outcome: Progressing   Problem: Nutrition: Goal: Adequate nutrition will be maintained Outcome: Progressing   Problem: Coping: Goal: Level of anxiety will decrease Outcome: Progressing   Problem: Elimination: Goal: Will not experience complications related to bowel motility Outcome: Progressing Goal: Will not experience complications related to urinary retention Outcome: Progressing   Problem: Pain Managment: Goal: General experience of comfort will improve and/or be controlled Outcome: Progressing   Problem: Safety: Goal: Ability to remain free from injury will improve Outcome: Progressing   Problem: Skin Integrity: Goal: Risk for impaired skin integrity will decrease Outcome: Progressing   Problem: Education: Goal: Knowledge of disease or condition will improve Outcome: Progressing Goal: Knowledge of the prescribed therapeutic regimen will improve Outcome: Progressing Goal: Individualized  Educational Video(s) Outcome: Progressing   Problem: Activity: Goal: Ability to tolerate increased activity will improve Outcome: Progressing Goal: Will verbalize the importance of balancing activity with adequate rest periods Outcome: Progressing   Problem: Respiratory: Goal: Ability to maintain a clear airway will improve Outcome: Progressing Goal: Levels of oxygenation will improve Outcome: Progressing Goal: Ability to maintain adequate ventilation will improve Outcome: Progressing

## 2023-10-05 NOTE — TOC Initial Note (Signed)
 Transition of Care [facility]) - Initial/Assessment Note    Patient Details  Name: Wendy Arias MRN: [id number] Date of Birth: 05-29-1951  Transition [facility]) CM/SW Contact:    Margarito Liner, LCSW Phone Number: 10/05/2023, 11:59 AM  Clinical Narrative:  Readmission prevention screen complete. CSW met with patient. Sister at bedside. CSW introduced role and explained that discharge planning would be discussed. PCP is Debbra Riding, PA-C. Sister drives her to appointments. She uses [facility]. Patient and sister stated they require prior authorization for pain patch. Patient also reports difficulty affording other medications including Trelegy and Gabapentin. Pharmacy is aware. Patient reports difficulty affording food and utility bills. CSW added resources to AVS. Patient lives home with her two children. No home health or DME use prior to admission. Patient is currently on acute oxygen. She stated she has gone home with oxygen several times but ends up not needing it. No further concerns. CSW will continue to follow patient for support and facilitate return home once stable. Sister will likely transport her home at discharge.                 Expected Discharge Plan: Home/Self Care Barriers to Discharge: Continued Medical Work up   Patient Goals and CMS Choice            Expected Discharge Plan and Services     Post Acute Care Choice: NA Living arrangements for the past 2 months: Single Family Home                                      Prior Living Arrangements/Services Living arrangements for the past 2 months: Single Family Home Lives with:: Adult Children Patient language and need for interpreter reviewed:: Yes Do you feel safe going back to the place where you live?: Yes      Need for Family Participation in Patient Care: Yes (Comment) Care giver support system in place?: Yes (comment)   Criminal Activity/Legal Involvement Pertinent to Current  Situation/Hospitalization: No - Comment as needed  Activities of Daily Living   ADL Screening (condition at time of admission) Independently performs ADLs?: Yes (appropriate for developmental age) Is the patient deaf or have difficulty hearing?: No Does the patient have difficulty seeing, even when wearing glasses/contacts?: No Does the patient have difficulty concentrating, remembering, or making decisions?: No  Permission Sought/Granted Permission sought to share information with : Family Supports Permission granted to share information with : Yes, Verbal Permission Granted  Share Information with NAME: Jon Gills     Permission granted to share info w Relationship: Sister  Permission granted to share info w Contact Information: [phone]  Emotional Assessment Appearance:: Appears stated age Attitude/Demeanor/Rapport: Engaged, Gracious Affect (typically observed): Accepting, Appropriate, Calm, Pleasant Orientation: : Oriented to Self, Oriented to Place, Oriented to  Time, Oriented to Situation Alcohol / Substance Use: Not Applicable Psych Involvement: No (comment)  Admission diagnosis:  COPD (chronic obstructive pulmonary disease) (HCC) [J44.9] COPD exacerbation (HCC) [J44.1] Acute respiratory failure with hypoxia (HCC) [J96.01] Patient Active Problem List   Diagnosis Date Noted   Abnormal CT scan, esophagus 10/05/2023   Esophageal thickening 10/03/2023   Abdominal pain, chronic, epigastric 10/03/2023   COPD (chronic obstructive pulmonary disease) (HCC) 10/01/2023   Respiratory distress 03/03/2023   Severe sepsis (HCC) 03/03/2023   COVID-19 virus infection 03/03/2023   Closed left ankle fracture 01/31/2023   Peripheral vascular  disease (HCC) (hx of femoral endarterectomy September 17 2021) 01/27/2023   Neuropathic pain of lower extremity 01/27/2023   Acute respiratory failure with hypoxia (HCC) 09/22/2022   Altered mental status 09/22/2022   Acute on chronic diastolic  CHF (congestive heart failure) (HCC) 07/13/2022   Acute metabolic encephalopathy 07/13/2022   HLD (hyperlipidemia) 07/13/2022   CAD (coronary artery disease) 07/13/2022   Depression with anxiety 07/13/2022   Tobacco abuse 07/13/2022   Atherosclerosis of artery of extremity with rest pain (HCC) 09/16/2021   Atherosclerosis of artery of extremity with ulceration (HCC) 08/07/2021   COPD exacerbation (HCC) 05/26/2021   Elevated serum alkaline phosphatase level 08/10/2018   Chronic diastolic CHF (congestive heart failure), NYHA class 3 (HCC) 08/09/2018   History of empyema of pleura 08/09/2018   Chronic insomnia 08/09/2018   Itching 08/09/2018   Chronic cough 08/09/2018   Coronary artery disease involving native coronary artery of native heart without angina pectoris 05/08/2018   At risk for fluid volume overload 04/18/2018   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 04/18/2018   Ischemic cardiomyopathy 04/18/2018   Mitral regurgitation 04/18/2018   Aspiration into respiratory tract 04/17/2018   Anxiety and depression 04/17/2018   Dysphagia 04/17/2018   Dyspnea on exertion 04/17/2018   IDA (iron deficiency anemia) 04/17/2018   Infection by Streptococcus, viridans group 04/17/2018   Abnormal pleural fluid    Hepatic cirrhosis (HCC)    Anemia of chronic disease    Empyema of right pleural space (HCC)    Pneumonia 04/09/2018   Postmenopausal 03/29/2018   Gastroesophageal reflux disease without esophagitis 03/01/2018   Pure hypercholesterolemia 03/01/2018   Abscess of middle lobe of right lung with pneumonia (HCC) 02/16/2018   Essential hypertension 12/16/2017   Other nonspecific abnormal finding of lung field 12/16/2017   Mass of middle lobe of right lung 11/22/2017   Mass of lower lobe of right lung 11/22/2017   Mediastinal adenopathy 11/22/2017   Acute encephalopathy 11/03/2017   Benzodiazepine withdrawal (HCC) 11/03/2017   H/O diarrhea 06/14/2017   PCP:   Wilford Corner, PA-C Pharmacy:   [facility] - [place], [place] - [address] [place] [address] Phone: [phone] Fax: [phone]     Social Drivers of Health (SDOH) Social History: SDOH Screenings   Food Insecurity: Food Insecurity Present (10/02/2023)  Housing: High Risk (10/02/2023)  Transportation Needs: Unmet Transportation Needs (10/02/2023)  Utilities: Not At Risk (10/02/2023)  Depression (PHQ2-9): Low Risk  (09/27/2023)  Financial Resource Strain: Low Risk  (07/11/2023)   Received from [facility]  Physical Activity: Inactive (02/16/2018)  Social Connections: Unknown (10/02/2023)  Stress: Stress Concern Present (02/16/2018)  Tobacco Use: High Risk (10/01/2023)   SDOH Interventions:     Readmission Risk Interventions    10/05/2023   11:56 AM 07/14/2022   11:37 AM  Readmission Risk Prevention Plan  Transportation Screening Complete Complete  PCP or Specialist Appt within 5-7 Days  Complete  Home Care Screening  Complete  Medication Review (RN CM)  Complete  Medication Review (RN Care Manager) Referral to Pharmacy   PCP or Specialist appointment within 3-5 days of discharge Complete   SW Recovery Care/Counseling Consult Complete   Palliative Care Screening Not Applicable   Skilled Nursing Facility Not Applicable

## 2023-10-05 NOTE — Care Management Important Message (Signed)
 Important Message  Patient Details  Name: Wendy Arias MRN: [id number] Date of Birth: [birth year]   Important Message Given:  Yes - Medicare IM     Cristela Blue, CMA 10/05/2023, 10:39 AM

## 2023-10-05 NOTE — Progress Notes (Signed)
 PROGRESS NOTE    Wendy Arias  AVW:[id number] DOB: [birth year] DOA: 10/01/2023 PCP: Wendy Riding Hestle, PA-C  131A/131A-AA  LOS: 3 days   Brief hospital course:   Assessment & Plan: Wendy Arias is 73 y.o. female with COPD Gold stage II, chronic heart failure preserved EF, tobacco abuse, CAD status post stenting, chronic pain syndrome, chronic narcotic dependence, who presents with worsening dyspnea and back pain.  Patient endorses productive cough and thick sputum with wheezing and exertional dyspnea.  She reports prior to arrival she developed back pain and rib cage pain worse with coughing and deep breath.  On arrival to the ED she was tachycardic, not hypotensive, and hypoxic to 80% on room air.  Chest x-ray revealed emphysema like changes and new onset of T6 compression fracture and chronic L1 compression fracture.  Patient was given breathing treatments but was persistently dyspneic and thus admitted for further workup.    Acute hypoxic respiratory failure Acute COPD exacerbation - Was initially started on azithromycin, procalcitonin unremarkable, afebrile without leukocytosis.  Doubt pneumonia.  Discontinued further antibiotics -- Respiratory viral panel negative - Sputum cultures with Moraxella catarrhalis.  Likely colonization. --cont prednisone --start hypertonic saline neb BID with DuoNeb --Continue supplemental O2 to keep sats >=90%, wean as tolerated   Esophageal mass Chronic nausea vomiting - CT chest with incidental finding of esophageal thickening.  Concerning for esophageal mass.  This was not seen on October CT - Patient endorses daily vomiting undigested food with solids and liquids, as well as 20 pound weight loss -Have started PPI on this admission. --EGD tomorrow   Tobacco abuse -Counseled on smoking cessation, patient resistant to discontinuation. --cont nicotine patch   T6 compression fracture Osteoporosis - Patient endorses taking vitamin D and calcium  outpatient - Will likely need more aggressive therapy.  Needs to follow-up with PCP -Continue with as needed pain meds - PT/OT   Hypertension Chronic heart failure with preserved EF - Clinically euvolemic --cont lasix   CAD -Continue statin --hold ASA plavix for upcoming EGD/biopsy   Chronic pain syndrome Neuropathic pain lower extremity Peripheral vascular disease - History of femoral endarterectomy in March 2023 - Follows outpatient with pain clinic - Pain remains difficult to control.  Continue to titrate meds as needed.  Multimodal pain plan, add baclofen today.   DVT prophylaxis: Lovenox SQ Code Status: DNR  Family Communication:  Level of care: Med-Surg Dispo:   The patient is from: home Anticipated d/c is to: home Anticipated d/c date is: 1-2 days   Subjective and Interval History:  Pt reported pain in the lateral chest wall.  Reported coughing with sputum production.  Sometimes can't swallow her food and have to spit it back up.   Objective: Vitals:   10/05/23 0727 10/05/23 0751 10/05/23 1100 10/05/23 1609  BP: (!) 153/114  118/87 109/85  Pulse: 69  77 82  Resp: 16  16 16   Temp: 97.9 F (36.6 C)   97.7 F (36.5 C)  TempSrc: Oral   Oral  SpO2: 98% 96% 91% 94%  Weight:      Height:        Intake/Output Summary (Last 24 hours) at 10/05/2023 1923 Last data filed at 10/05/2023 1916 Gross per 24 hour  Intake 240 ml  Output --  Net 240 ml   Filed Weights   10/01/23 1151  Weight: 57.2 kg    Examination:   Constitutional: NAD, AAOx3 HEENT: conjunctivae and lids normal, EOMI CV: No cyanosis.  RESP: normal respiratory effort, on 1L Neuro: II - XII grossly intact.   Psych: Normal mood and affect.  Appropriate judgement and reason   Data Reviewed: I have personally reviewed labs and imaging studies  Time spent: 50 minutes  Darlin Priestly, MD [facility] If 7PM-7AM, please contact night-coverage 10/05/2023, 7:23 PM

## 2023-10-06 ENCOUNTER — Inpatient Hospital Stay: Admitting: Certified Registered"

## 2023-10-06 ENCOUNTER — Encounter
Admission: EM | Disposition: A | Discharge status: Home / Self Care | Payer: Self-pay | Source: Home / Self Care | Attending: Family Medicine

## 2023-10-06 ENCOUNTER — Encounter: Payer: Self-pay | Admitting: Family Medicine

## 2023-10-06 ENCOUNTER — Other Ambulatory Visit: Payer: Self-pay

## 2023-10-06 DIAGNOSIS — J441 Chronic obstructive pulmonary disease with (acute) exacerbation: Secondary | ICD-10-CM | POA: Diagnosis not present

## 2023-10-06 DIAGNOSIS — R933 Abnormal findings on diagnostic imaging of other parts of digestive tract: Secondary | ICD-10-CM | POA: Diagnosis not present

## 2023-10-06 DIAGNOSIS — R1310 Dysphagia, unspecified: Secondary | ICD-10-CM

## 2023-10-06 HISTORY — PX: ESOPHAGOGASTRODUODENOSCOPY: SHX5428

## 2023-10-06 LAB — KOH PREP: KOH Prep: NONE SEEN

## 2023-10-06 SURGERY — EGD (ESOPHAGOGASTRODUODENOSCOPY)
Anesthesia: General

## 2023-10-06 MED ORDER — SODIUM CHLORIDE 0.9 % IV SOLN: INTRAVENOUS | Status: DC

## 2023-10-06 MED ORDER — LIDOCAINE HCL (PF) 2 % IJ SOLN
INTRAMUSCULAR | Status: AC
  Filled 2023-10-06: qty 5

## 2023-10-06 MED ORDER — LIDOCAINE HCL (PF) 2 % IJ SOLN
INTRAMUSCULAR | Status: DC | PRN
  Administered 2023-10-06: 100 mg via INTRADERMAL

## 2023-10-06 MED ORDER — FLUCONAZOLE 200 MG PO TABS
400.0000 mg | ORAL_TABLET | Freq: Every day | ORAL | 0 refills | Status: AC
  Filled 2023-10-06: qty 28, 14d supply, fill #0

## 2023-10-06 MED ORDER — ACETAMINOPHEN 500 MG PO TABS: 1000.0000 mg | ORAL_TABLET | Freq: Three times a day (TID) | ORAL | Status: DC | PRN

## 2023-10-06 MED ORDER — PROPOFOL 10 MG/ML IV BOLUS
INTRAVENOUS | Status: AC
  Filled 2023-10-06: qty 40

## 2023-10-06 MED ORDER — PROPOFOL 500 MG/50ML IV EMUL
INTRAVENOUS | Status: DC | PRN
Start: 2023-10-06 — End: 2023-10-06
  Administered 2023-10-06: 100 mg via INTRAVENOUS
  Administered 2023-10-06 (×3): 10 mg via INTRAVENOUS

## 2023-10-06 MED ORDER — NICOTINE 21 MG/24HR TD PT24
21.0000 mg | MEDICATED_PATCH | Freq: Every day | TRANSDERMAL | 0 refills | Status: DC
  Filled 2023-10-06: qty 28, 28d supply, fill #0

## 2023-10-06 MED ORDER — FLUCONAZOLE 100 MG PO TABS
100.0000 mg | ORAL_TABLET | Freq: Every day | ORAL | Status: DC
  Filled 2023-10-06: qty 1

## 2023-10-06 MED ORDER — FLUCONAZOLE 100 MG PO TABS
400.0000 mg | ORAL_TABLET | Freq: Every day | ORAL | Status: DC
  Administered 2023-10-06: 400 mg via ORAL
  Filled 2023-10-06: qty 4

## 2023-10-06 MED ORDER — ENSURE ENLIVE PO LIQD: 237.0000 mL | Freq: Two times a day (BID) | ORAL | Status: DC

## 2023-10-06 NOTE — Anesthesia Postprocedure Evaluation (Signed)
 Anesthesia Post Note  Patient: Wendy Arias  Procedure(s) Performed: EGD (ESOPHAGOGASTRODUODENOSCOPY)  Patient location during evaluation: PACU Anesthesia Type: General Level of consciousness: awake and awake and alert Pain management: satisfactory to patient Vital Signs Assessment: post-procedure vital signs reviewed and stable Respiratory status: nonlabored ventilation Cardiovascular status: stable Anesthetic complications: no   No notable events documented.   Last Vitals:  Vitals:   10/06/23 1400 10/06/23 1410  BP: (!) 160/79 (!) 147/82  Pulse: 84 76  Resp: (!) 24 15  Temp:    SpO2: 99% 99%    Last Pain:  Vitals:   10/06/23 1410  TempSrc:   PainSc: 9                  VAN STAVEREN,Alisah Grandberry

## 2023-10-06 NOTE — Transfer of Care (Signed)
 Immediate Anesthesia Transfer of Care Note  Patient: Wendy Arias  Procedure(s) Performed: EGD (ESOPHAGOGASTRODUODENOSCOPY)  Patient Location: Endoscopy Unit  Anesthesia Type:General  Level of Consciousness: awake, alert , and oriented  Airway & Oxygen Therapy: Patient Spontanous Breathing  Post-op Assessment: Report given to RN and Post -op Vital signs reviewed and stable  Post vital signs: Reviewed and stable  Last Vitals:  Vitals Value Taken Time  BP 135/74 1348  Temp 35.8 1348  Pulse 79 1348  Resp 20 1348  SpO2 98 1348    Last Pain:  Vitals:   10/06/23 1320  TempSrc: Temporal  PainSc: 8          Complications: No notable events documented.

## 2023-10-06 NOTE — Anesthesia Preprocedure Evaluation (Addendum)
 Anesthesia Evaluation  Patient identified by MRN, date of birth, ID band Patient awake    Reviewed: Allergy & Precautions, NPO status , Patient's Chart, lab work & pertinent test results  Airway Mallampati: IV  TM Distance: >3 FB     Dental  (+) Upper Dentures   Pulmonary asthma , COPD,  oxygen dependent, Current Smoker and Patient abstained from smoking.    + decreased breath sounds      Cardiovascular hypertension, Pt. on medications + CAD, + Past MI, + Peripheral Vascular Disease, +CHF and + DOE   Rhythm:Regular Rate:Normal     Neuro/Psych   Anxiety        GI/Hepatic Neg liver ROS,GERD  Medicated,,  Endo/Other  negative endocrine ROS    Renal/GU negative Renal ROS     Musculoskeletal   Abdominal Normal abdominal exam  (+)   Peds negative pediatric ROS (+)  Hematology  (+) Blood dyscrasia, anemia   Anesthesia Other Findings   Reproductive/Obstetrics                             Anesthesia Physical Anesthesia Plan  ASA: 3  Anesthesia Plan: General   Post-op Pain Management:    Induction: Intravenous  PONV Risk Score and Plan:   Airway Management Planned: Natural Airway and Nasal Cannula  Additional Equipment:   Intra-op Plan:   Post-operative Plan:   Informed Consent: I have reviewed the patients History and Physical, chart, labs and discussed the procedure including the risks, benefits and alternatives for the proposed anesthesia with the patient or authorized representative who has indicated his/her understanding and acceptance.       Plan Discussed with: CRNA  Anesthesia Plan Comments:        Anesthesia Quick Evaluation

## 2023-10-06 NOTE — Op Note (Signed)
 [facility] Patient Name: Wendy Arias Procedure Date: 10/06/2023 12:37 PM MRN: [id number] Account #: [id number] Date of Birth: 1951-06-18 Admit Type: Inpatient Age: 73 Room: [facility] ENDO ROOM 4 Gender: Female Note Status: Finalized Instrument Name: Laurette Schimke [id number] Procedure:             Upper GI endoscopy Indications:           Odynophagia Providers:             Midge Minium MD, MD Referring MD:          Wilford Corner (Referring MD) Medicines:             Propofol per Anesthesia Complications:         No immediate complications. Procedure:             Pre-Anesthesia Assessment:                        - Prior to the procedure, a History and Physical was                         performed, and patient medications and allergies were                         reviewed. The patient's tolerance of previous                         anesthesia was also reviewed. The risks and benefits                         of the procedure and the sedation options and risks                         were discussed with the patient. All questions were                         answered, and informed consent was obtained. Prior                         Anticoagulants: The patient has taken Plavix                         (clopidogrel), last dose was 5 days prior to                         procedure. ASA Grade Assessment: III - A patient with                         severe systemic disease. After reviewing the risks and                         benefits, the patient was deemed in satisfactory                         condition to undergo the procedure.                        After obtaining informed consent, the endoscope was  passed under direct vision. Throughout the procedure,                         the patient's blood pressure, pulse, and oxygen                         saturations were monitored continuously. The Endoscope                          was introduced through the mouth, and advanced to the                         second part of duodenum. The upper GI endoscopy was                         accomplished without difficulty. The patient tolerated                         the procedure well. Findings:      Patchy, white plaques were found in the entire esophagus. Cells for       cytology were obtained by brushing.      The stomach was normal.      The examined duodenum was normal. Impression:            - Esophageal plaques were found, suspicious for                         candidiasis. Cells for cytology obtained.                        - Normal stomach.                        - Normal examined duodenum. Recommendation:        - Return patient to hospital ward for ongoing care.                        - Resume previous diet.                        - Continue present medications.                        - Await pathology results. Procedure Code(s):     --- Professional ---                        [phone], Esophagogastroduodenoscopy, flexible,                         transoral; diagnostic, including collection of                         specimen(s) by brushing or washing, when performed                         (separate procedure) Diagnosis Code(s):     --- Professional ---                        R13.10, Dysphagia, unspecified  K22.9, Disease of esophagus, unspecified CPT copyright 2022 [employer]. All rights reserved. The codes documented in this report are preliminary and upon coder review may  be revised to meet current compliance requirements. Midge Minium MD, MD 10/06/2023 1:41:10 PM This report has been signed electronically. Number of Addenda: 0 Note Initiated On: 10/06/2023 12:37 PM Estimated Blood Loss:  Estimated blood loss: none.      [facility]

## 2023-10-06 NOTE — Plan of Care (Signed)

## 2023-10-06 NOTE — TOC Transition Note (Signed)
 Transition of Care [facility]) - Discharge Note   Patient Details  Name: Wendy Arias MRN: [id number] Date of Birth: 06-26-1951  [facility] CM/SW Contact:  Alesia Richards, RN 10/06/2023, 4:11 PM   Clinical Narrative:      Noted, discharge orders for home/self care. Private transportation.    Barriers to Discharge: Continued Medical Work up   Patient Goals and CMS Choice      Home/self care    Discharge Placement       Home/self care         Discharge Plan and Services Additional resources added to the After Visit Summary for       Post Acute Care Choice: NA              Social Drivers of Health (SDOH) Interventions SDOH Screenings   Food Insecurity: Food Insecurity Present (10/02/2023)  Housing: High Risk (10/02/2023)  Transportation Needs: Unmet Transportation Needs (10/02/2023)  Utilities: Not At Risk (10/02/2023)  Depression (PHQ2-9): Low Risk  (09/27/2023)  Financial Resource Strain: Low Risk  (07/11/2023)   Received from [facility]  Physical Activity: Inactive (02/16/2018)  Social Connections: Unknown (10/02/2023)  Stress: Stress Concern Present (02/16/2018)  Tobacco Use: High Risk (10/06/2023)     Readmission Risk Interventions    10/05/2023   11:56 AM 07/14/2022   11:37 AM  Readmission Risk Prevention Plan  Transportation Screening Complete Complete  PCP or Specialist Appt within 5-7 Days  Complete  Home Care Screening  Complete  Medication Review (RN CM)  Complete  Medication Review (RN Care Manager) Referral to Pharmacy   PCP or Specialist appointment within 3-5 days of discharge Complete   SW Recovery Care/Counseling Consult Complete   Palliative Care Screening Not Applicable   Skilled Nursing Facility Not Applicable

## 2023-10-06 NOTE — Plan of Care (Signed)

## 2023-10-06 NOTE — Progress Notes (Signed)
SATURATION QUALIFICATIONS: (This note is used to comply with regulatory documentation for home oxygen)  Patient Saturations on Room Air at Rest = 96 %  Patient Saturations on Room Air while Ambulating = 92 %  

## 2023-10-06 NOTE — Discharge Summary (Signed)
 Physician Discharge Summary   Wendy Arias  female DOB: 02/04/1951  ZOX:[id number]  PCP: Wilford Corner, PA-C  Admit date: 10/01/2023 Discharge date: 10/06/2023  Admitted From: home Disposition:  home CODE STATUS: DNR  Discharge Instructions     Discharge instructions   Complete by: As directed    You have yeast infection in your esophagus.  Please take fluconazole 400 mg daily for 14 days. [facility] Course:  For full details, please see H&P, progress notes, consult notes and ancillary notes.  Briefly,  Wendy Arias is 73 y.o. female with COPD Gold stage II, chronic heart failure preserved EF, tobacco abuse, CAD status post stenting, chronic pain syndrome, chronic narcotic dependence, who presented with worsening dyspnea and back pain.    On arrival to the ED she was tachycardic, not hypotensive, and hypoxic to 80% on room air.  Chest x-ray revealed emphysema like changes and new onset of T6 compression fracture and chronic L1 compression fracture.  Patient was given breathing treatments but was persistently dyspneic and thus admitted for further workup.    Acute hypoxic respiratory failure Acute COPD exacerbation - Was initially started on azithromycin, procalcitonin unremarkable, afebrile without leukocytosis.  Doubt pneumonia.  Discontinued further antibiotics -- Respiratory viral panel negative - Sputum cultures with Moraxella catarrhalis.  Likely colonization. --finished a course of steroid burst.  Received hypertonic saline neb BID with DuoNeb for mucus clearance.   --cont home bronchodilators --pt was weaned down to room air prior to discharge.  Esophageal thickening 2/2 Esophageal candidiasis  Chronic nausea vomiting - CT chest with incidental finding of esophageal thickening.  Concerning for esophageal mass.  This was not seen on October CT - Patient endorses daily vomiting undigested food with solids and liquids, as well as 20 pound weight  loss --EGD found findings consistent with esophageal candidiasis. --discharged on fluconazole 400 mg for 14 days.   Tobacco abuse -Counseled on smoking cessation, patient resistant to discontinuation. --cont nicotine patch   T6 compression fracture Osteoporosis - Patient endorses taking vitamin D and calcium outpatient - Will likely need more aggressive therapy.  Needs to follow-up with PCP   Hypertension Chronic heart failure with preserved EF - Clinically euvolemic --cont lasix   CAD -Continue statin --resume ASA plavix after discharge.   Chronic pain syndrome Neuropathic pain lower extremity Peripheral vascular disease - History of femoral endarterectomy in March 2023 - Follows outpatient with pain clinic --pt not taking buprenorphine PTA.   Discharge Diagnoses:  Principal Problem:   COPD (chronic obstructive pulmonary disease) (HCC) Active Problems:   COPD exacerbation (HCC)   Tobacco abuse   Esophageal thickening   Abdominal pain, chronic, epigastric   Abnormal CT scan, esophagus   Problems with swallowing and mastication   30 Day Unplanned Readmission Risk Score    Flowsheet Row ED to Hosp-Admission (Current) from 10/01/2023 in [facility] (1A)  30 Day Unplanned Readmission Risk Score (%) 29.18 Filed at 10/06/2023 1200       This score is the patient's risk of an unplanned readmission within 30 days of being discharged (0 -100%). The score is based on dignosis, age, lab data, medications, orders, and past utilization.   Low:  0-14.9   Medium: 15-21.9   High: 22-29.9   Extreme: 30 and above         Discharge Instructions:  Allergies as of 10/06/2023       Reactions   Penicillins Other (  See Comments)   Tolerated cephalosporins with no significant documented ADRs -3rd generation: CEFTRIAXONE: 04/14/2018, 05/26/2021 and CEFDINIR: 04/14/2018  -4th generation CEFEPIME: 04/18/2018 Has tolerated amoxicillin-clavulanate in  the past.  PCN rxn causing immediate rash, facial/tongue/throat swelling, SOB or lightheadedness with hypotension: Yes No reaction causing severe rash involving mucus membranes or skin necrosis, reaction that required hospitalization, or reaction occurring within last 10 years.        Medication List     STOP taking these medications    buprenorphine 7.5 MCG/HR Commonly known as: Butrans       TAKE these medications    acetaminophen 500 MG tablet Commonly known as: TYLENOL Take 2 tablets (1,000 mg total) by mouth every 8 (eight) hours as needed. What changed:  when to take this reasons to take this   albuterol 108 (90 Base) MCG/ACT inhaler Commonly known as: VENTOLIN HFA Inhale into the lungs every 6 (six) hours as needed for wheezing or shortness of breath.   amitriptyline 100 MG tablet Commonly known as: ELAVIL Take 100 mg by mouth at bedtime.   aspirin EC 81 MG tablet Take 1 tablet (81 mg total) by mouth daily.   atorvastatin 40 MG tablet Commonly known as: LIPITOR Take 40 mg by mouth daily.   clopidogrel 75 MG tablet Commonly known as: PLAVIX TAKE 1 TABLET BY MOUTH DAILY AT 6AM What changed: See the new instructions.   cyanocobalamin 1000 MCG tablet Commonly known as: VITAMIN B12 Take 1,000 mcg by mouth daily.   feeding supplement Liqd Take 237 mLs by mouth 2 (two) times daily between meals. Start taking on: October 07, 2023   fluconazole 200 MG tablet Commonly known as: DIFLUCAN Take 2 tablets (400 mg total) by mouth daily for 14 days.   furosemide 20 MG tablet Commonly known as: LASIX Take 20 mg by mouth daily.   gabapentin 300 MG capsule Commonly known as: NEURONTIN Take 2 capsules (600 mg total) by mouth at bedtime. What changed:  when to take this additional instructions   ipratropium-albuterol 0.5-2.5 (3) MG/3ML Soln Commonly known as: DUONEB Take 3 mLs by nebulization every 6 (six) hours as needed.   Magnesium 400 MG Caps Take 1  tablet by mouth daily.   nicotine 21 mg/24hr patch Commonly known as: NICODERM CQ - dosed in mg/24 hours Place 1 patch (21 mg total) onto the skin daily. Start taking on: October 07, 2023   nitroGLYCERIN 0.4 MG SL tablet Commonly known as: NITROSTAT Place under the tongue.   Trelegy Ellipta 100-62.5-25 MCG/ACT Aepb Generic drug: Fluticasone-Umeclidin-Vilant Inhale 1 puff into the lungs daily.   Vitamin E 268 MG (400 UNIT) Caps Take 400 Units by mouth daily.         Follow-up Information     Debbra Riding Hestle, PA-C Follow up in 1 week(s).   Specialty: Family Medicine Contact information: [address] [place] [address] [phone]                 Allergies  Allergen Reactions   Penicillins Other (See Comments)    Tolerated cephalosporins with no significant documented ADRs -3rd generation: CEFTRIAXONE: 04/14/2018, 05/26/2021 and CEFDINIR: 04/14/2018  -4th generation CEFEPIME: 04/18/2018  Has tolerated amoxicillin-clavulanate in the past.   PCN rxn causing immediate rash, facial/tongue/throat swelling, SOB or lightheadedness with hypotension: Yes  No reaction causing severe rash involving mucus membranes or skin necrosis, reaction that required hospitalization, or reaction occurring within last 10 years.     The results of  significant diagnostics from this hospitalization (including imaging, microbiology, ancillary and laboratory) are listed below for reference.   Consultations:   Procedures/Studies: CT Angio Chest Pulmonary Embolism (PE) W or WO Contrast Result Date: 10/01/2023 CLINICAL DATA:  Shortness of breath.  Hypoxia.  Coughing. EXAM: CT ANGIOGRAPHY CHEST WITH CONTRAST TECHNIQUE: Multidetector CT imaging of the chest was performed using the standard protocol during bolus administration of intravenous contrast. Multiplanar CT image reconstructions and MIPs were obtained to evaluate the vascular anatomy. RADIATION DOSE REDUCTION: This  exam was performed according to the departmental dose-optimization program which includes automated exposure control, adjustment of the mA and/or kV according to patient size and/or use of iterative reconstruction technique. CONTRAST:  75mL OMNIPAQUE IOHEXOL 350 MG/ML SOLN COMPARISON:  03/03/2023 FINDINGS: Cardiovascular: Satisfactory opacification of pulmonary arteries noted, and no pulmonary emboli identified. No evidence of thoracic aortic dissection or aneurysm. Mediastinum/Nodes: Mild mediastinal lymphadenopathy shows mild decrease since previous study, with index lymph node in the right paratracheal region measuring 15 mm compared to 19 mm previously. Concentric wall thickening is seen involving the midthoracic esophagus, suspicious for esophageal mass. Mild dilatation and debris is seen in the upper thoracic esophagus. Lungs/Pleura: Previously seen left lower lobe airspace disease has resolved since previous study. Bilateral pleural-parenchymal scarring is again noted. Mild emphysema again seen. No evidence of pulmonary consolidation, mass, or pleural effusion. Upper abdomen: No acute findings. Musculoskeletal: No suspicious bone lesions identified. Old compression fracture deformity of the L1 vertebral body again noted. Review of the MIP images confirms the above findings. IMPRESSION: No evidence of pulmonary embolism. Concentric wall thickening of midthoracic esophagus, with proximal esophageal dilatation. This is suspicious for esophageal mass. Mild mediastinal lymphadenopathy, mildly decreased since previous study. Interval resolution of previously seen left lower lobe airspace disease. Emphysema (ICD10-J43.9) and bilateral pleural-parenchymal scarring. Electronically Signed   By: Danae Orleans M.D.   On: 10/01/2023 15:13   DG Chest 2 View Result Date: 10/01/2023 CLINICAL DATA:  73 year old female with shortness of breath, hypoxia. EXAM: CHEST - 2 VIEW COMPARISON:  CTA chest 03/03/2023 and earlier.  FINDINGS: AP seated and lateral views 1138 hours. Chronically large lung volumes, some emphysema demonstrated by CTA last year. Chronic tortuosity of the thoracic aorta, mediastinal contours are stable. Coarse, symmetric interstitial markings are stable. No pneumothorax, pulmonary edema, pleural effusion or confluent lung opacity identified. Osteopenia. Chronic L1 compression fracture. A T6 compression fracture is new since last year, age indeterminate. Negative visible bowel gas. Calcified aortic atherosclerosis. IMPRESSION: 1. Chronic lung disease with evidence hyperinflation. No acute cardiopulmonary abnormality. 2. Osteopenia with a T6 compression fracture new since last September, age indeterminate. Chronic L1 compression fracture. Electronically Signed   By: Odessa Fleming M.D.   On: 10/01/2023 11:59      Labs: BNP (last 3 results) Recent Labs    01/31/23 1405 10/01/23 1123  BNP 55.3 138.4*   Basic Metabolic Panel: Recent Labs  Lab 10/01/23 1123 10/04/23 0327  NA 138 138  K 4.3 4.2  CL 102 102  CO2 26 28  GLUCOSE 91 96  BUN 19 32*  CREATININE 1.00 1.02*  CALCIUM 9.0 8.8*   Liver Function Tests: Recent Labs  Lab 10/01/23 1123 10/04/23 0327  AST 21 15  ALT 14 14  ALKPHOS 120 95  BILITOT 0.7 0.5  PROT 7.2 6.3*  ALBUMIN 4.0 3.3*   Recent Labs  Lab 10/01/23 1123  LIPASE 19   No results for input(s): "AMMONIA" in the last 168 hours. CBC: Recent Labs  Lab 10/01/23 1123 10/04/23 0327  WBC 6.8 8.7  NEUTROABS 4.8  --   HGB 12.9 11.8*  HCT 42.0 37.1  MCV 88.8 85.1  PLT 267 268   Cardiac Enzymes: No results for input(s): "CKTOTAL", "CKMB", "CKMBINDEX", "TROPONINI" in the last 168 hours. BNP: Invalid input(s): "POCBNP" CBG: No results for input(s): "GLUCAP" in the last 168 hours. D-Dimer No results for input(s): "DDIMER" in the last 72 hours. Hgb A1c No results for input(s): "HGBA1C" in the last 72 hours. Lipid Profile No results for input(s): "CHOL", "HDL",  "LDLCALC", "TRIG", "CHOLHDL", "LDLDIRECT" in the last 72 hours. Thyroid function studies No results for input(s): "TSH", "T4TOTAL", "T3FREE", "THYROIDAB" in the last 72 hours.  Invalid input(s): "FREET3" Anemia work up No results for input(s): "VITAMINB12", "FOLATE", "FERRITIN", "TIBC", "IRON", "RETICCTPCT" in the last 72 hours. Urinalysis    Component Value Date/Time   COLORURINE YELLOW (A) 03/03/2023 1635   APPEARANCEUR HAZY (A) 03/03/2023 1635   LABSPEC 1.033 (H) 03/03/2023 1635   PHURINE 5.0 03/03/2023 1635   GLUCOSEU NEGATIVE 03/03/2023 1635   HGBUR SMALL (A) 03/03/2023 1635   BILIRUBINUR NEGATIVE 03/03/2023 1635   KETONESUR NEGATIVE 03/03/2023 1635   PROTEINUR NEGATIVE 03/03/2023 1635   NITRITE POSITIVE (A) 03/03/2023 1635   LEUKOCYTESUR NEGATIVE 03/03/2023 1635   Sepsis Labs Recent Labs  Lab 10/01/23 1123 10/04/23 0327  WBC 6.8 8.7   Microbiology Recent Results (from the past 240 hours)  Resp panel by RT-PCR (RSV, Flu A&B, Covid) Anterior Nasal Swab     Status: None   Collection Time: 10/01/23 11:23 AM   Specimen: Anterior Nasal Swab  Result Value Ref Range Status   SARS Coronavirus 2 by RT PCR NEGATIVE NEGATIVE Final    Comment: (NOTE) SARS-CoV-2 target nucleic acids are NOT DETECTED.  The SARS-CoV-2 RNA is generally detectable in upper respiratory specimens during the acute phase of infection. The lowest concentration of SARS-CoV-2 viral copies this assay can detect is 138 copies/mL. A negative result does not preclude SARS-Cov-2 infection and should not be used as the sole basis for treatment or other patient management decisions. A negative result may occur with  improper specimen collection/handling, submission of specimen other than nasopharyngeal swab, presence of viral mutation(s) within the areas targeted by this assay, and inadequate number of viral copies(<138 copies/mL). A negative result must be combined with clinical observations, patient history,  and epidemiological information. The expected result is Negative.  Fact Sheet for Patients:  BloggerCourse.com  Fact Sheet for Healthcare Providers:  SeriousBroker.it  This test is no t yet approved or cleared by the [place] FDA and  has been authorized for detection and/or diagnosis of SARS-CoV-2 by FDA under an Emergency Use Authorization (EUA). This EUA will remain  in effect (meaning this test can be used) for the duration of the COVID-19 declaration under Section 564(b)(1) of the Act, 21 U.S.C.section 360bbb-3(b)(1), unless the authorization is terminated  or revoked sooner.       Influenza A by PCR NEGATIVE NEGATIVE Final   Influenza B by PCR NEGATIVE NEGATIVE Final    Comment: (NOTE) The Xpert Xpress SARS-CoV-2/FLU/RSV plus assay is intended as an aid in the diagnosis of influenza from Nasopharyngeal swab specimens and should not be used as a sole basis for treatment. Nasal washings and aspirates are unacceptable for Xpert Xpress SARS-CoV-2/FLU/RSV testing.  Fact Sheet for Patients: BloggerCourse.com  Fact Sheet for Healthcare Providers: SeriousBroker.it  This test is not yet approved or cleared by the [place] and  has been authorized for detection and/or diagnosis of SARS-CoV-2 by FDA under an Emergency Use Authorization (EUA). This EUA will remain in effect (meaning this test can be used) for the duration of the COVID-19 declaration under Section 564(b)(1) of the Act, 21 U.S.C. section 360bbb-3(b)(1), unless the authorization is terminated or revoked.     Resp Syncytial Virus by PCR NEGATIVE NEGATIVE Final    Comment: (NOTE) Fact Sheet for Patients: BloggerCourse.com  Fact Sheet for Healthcare Providers: SeriousBroker.it  This test is not yet approved or cleared by the [place] FDA and has  been authorized for detection and/or diagnosis of SARS-CoV-2 by FDA under an Emergency Use Authorization (EUA). This EUA will remain in effect (meaning this test can be used) for the duration of the COVID-19 declaration under Section 564(b)(1) of the Act, 21 U.S.C. section 360bbb-3(b)(1), unless the authorization is terminated or revoked.  Performed at [facility], [address] [place], [place] [address]   Respiratory (~20 pathogens) panel by PCR     Status: None   Collection Time: 10/01/23  1:40 PM   Specimen: Nasopharyngeal Swab; Respiratory  Result Value Ref Range Status   Adenovirus NOT DETECTED NOT DETECTED Final   Coronavirus 229E NOT DETECTED NOT DETECTED Final    Comment: (NOTE) The Coronavirus on the Respiratory Panel, DOES NOT test for the novel  Coronavirus (2019 nCoV)    Coronavirus HKU1 NOT DETECTED NOT DETECTED Final   Coronavirus NL63 NOT DETECTED NOT DETECTED Final   Coronavirus OC43 NOT DETECTED NOT DETECTED Final   Metapneumovirus NOT DETECTED NOT DETECTED Final   Rhinovirus / Enterovirus NOT DETECTED NOT DETECTED Final   Influenza A NOT DETECTED NOT DETECTED Final   Influenza B NOT DETECTED NOT DETECTED Final   Parainfluenza Virus 1 NOT DETECTED NOT DETECTED Final   Parainfluenza Virus 2 NOT DETECTED NOT DETECTED Final   Parainfluenza Virus 3 NOT DETECTED NOT DETECTED Final   Parainfluenza Virus 4 NOT DETECTED NOT DETECTED Final   Respiratory Syncytial Virus NOT DETECTED NOT DETECTED Final   Bordetella pertussis NOT DETECTED NOT DETECTED Final   Bordetella Parapertussis NOT DETECTED NOT DETECTED Final   Chlamydophila pneumoniae NOT DETECTED NOT DETECTED Final   Mycoplasma pneumoniae NOT DETECTED NOT DETECTED Final    Comment: Performed at [facility], [address], [place], [place] [address]  Expectorated Sputum Assessment w Gram Stain, Rflx to Resp Cult     Status: None   Collection Time: 10/01/23  3:36 PM   Specimen: Sputum  Result  Value Ref Range Status   Specimen Description SPUTUM  Final   Special Requests NONE  Final   Sputum evaluation   Final    THIS SPECIMEN IS ACCEPTABLE FOR SPUTUM CULTURE Performed at [facility], [address] [place], [place] [address]    Report Status 10/01/2023 FINAL  Final  Culture, Respiratory w Gram Stain     Status: None   Collection Time: 10/01/23  3:36 PM   Specimen: SPU  Result Value Ref Range Status   Specimen Description   Final    SPUTUM Performed at [facility], [address], [place], [place] [address]    Special Requests   Final    NONE Reflexed from [id number] Performed at [facility], [address] [place], [place] [address]    Gram Stain   Final    FEW WBC PRESENT, PREDOMINANTLY PMN FEW GRAM POSITIVE COCCI RARE GRAM NEGATIVE RODS RARE GRAM NEGATIVE DIPLOCOCCI  Culture   Final    ABUNDANT MORAXELLA CATARRHALIS(BRANHAMELLA) BETA LACTAMASE POSITIVE Performed at [facility], [address], [place], [place] [address]    Report Status 10/04/2023 FINAL  Final  KOH prep     Status: None   Collection Time: 10/06/23  1:38 PM   Specimen: Other Source; Body Fluid  Result Value Ref Range Status   Specimen Description ESOPHAGUS  Final   Special Requests NONE  Final   KOH Prep   Final    NO YEAST OR FUNGAL ELEMENTS SEEN Performed at [facility], [address] [place], [place] [address]    Report Status 10/06/2023 FINAL  Final     Total time spend on discharging this patient, including the last patient exam, discussing the hospital stay, instructions for ongoing care as it relates to all pertinent caregivers, as well as preparing the medical discharge records, prescriptions, and/or referrals as applicable, is 40 minutes.    Darlin Priestly, MD  [facility] 10/06/2023, 3:22 PM

## 2023-10-07 ENCOUNTER — Other Ambulatory Visit: Payer: Self-pay

## 2023-10-07 ENCOUNTER — Encounter: Payer: Self-pay | Admitting: Gastroenterology

## 2023-10-25 ENCOUNTER — Other Ambulatory Visit: Payer: Self-pay

## 2023-10-25 ENCOUNTER — Emergency Department

## 2023-10-25 ENCOUNTER — Emergency Department: Admission: EM | Admit: 2023-10-25 | Discharge: 2023-10-25 | Discharge status: Against Medical Advice

## 2023-10-25 DIAGNOSIS — Z5321 Procedure and treatment not carried out due to patient leaving prior to being seen by health care provider: Secondary | ICD-10-CM | POA: Insufficient documentation

## 2023-10-25 DIAGNOSIS — R072 Precordial pain: Secondary | ICD-10-CM | POA: Insufficient documentation

## 2023-10-25 LAB — BASIC METABOLIC PANEL WITH GFR
Anion gap: 10 (ref 5–15)
BUN: 32 mg/dL — ABNORMAL HIGH (ref 8–23)
CO2: 26 mmol/L (ref 22–32)
Calcium: 9.1 mg/dL (ref 8.9–10.3)
Chloride: 102 mmol/L (ref 98–111)
Creatinine, Ser: 1.02 mg/dL — ABNORMAL HIGH (ref 0.44–1.00)
GFR, Estimated: 58 mL/min — ABNORMAL LOW (ref >60)
Glucose, Bld: 102 mg/dL — ABNORMAL HIGH (ref 70–99)
Potassium: 4.2 mmol/L (ref 3.5–5.1)
Sodium: 138 mmol/L (ref 135–145)

## 2023-10-25 LAB — CBC
HCT: 37.8 % (ref 36.0–46.0)
Hemoglobin: 11.9 g/dL — ABNORMAL LOW (ref 12.0–15.0)
MCH: 28.1 pg (ref 26.0–34.0)
MCHC: 31.5 g/dL (ref 30.0–36.0)
MCV: 89.2 fL (ref 80.0–100.0)
Platelets: 321 10*3/uL (ref 150–400)
RBC: 4.24 MIL/uL (ref 3.87–5.11)
RDW: 21.1 % — ABNORMAL HIGH (ref 11.5–15.5)
WBC: 4.8 10*3/uL (ref 4.0–10.5)
nRBC: 0 % (ref 0.0–0.2)

## 2023-10-25 LAB — TROPONIN I (HIGH SENSITIVITY): Troponin I (High Sensitivity): 5 ng/L (ref <18)

## 2023-10-25 NOTE — ED Triage Notes (Signed)
 Patient comes in from [facility] with complaints of mid sternal chest pain that radiates around to her spine that started on Saturday. Pt has been alternating between tylenol  and motrin , but has had no relief. No n/v/ SOB or weakness reported. Pt states that when she sits down,the pain is much worse. Pt is alert and oriented x4, with no signs of acute distress at time.

## 2023-10-25 NOTE — ED Triage Notes (Signed)
 First Nurse Note: Patient to ED from [facility] for mid sternal CP since Saturday. Also had low O2 at 88% on RA but came back up to 95% per [facility] after sitting.

## 2023-10-25 NOTE — ED Notes (Signed)
 Pt to First Nurse desk stating she no longer wanted to wait to be seen. Pt encouraged to stay but declined stating she would f/u with PCP. NAD noted.

## 2023-11-07 ENCOUNTER — Other Ambulatory Visit: Payer: Self-pay | Admitting: Family Medicine

## 2023-11-07 DIAGNOSIS — S22000A Wedge compression fracture of unspecified thoracic vertebra, initial encounter for closed fracture: Secondary | ICD-10-CM

## 2023-11-11 ENCOUNTER — Ambulatory Visit
Admission: RE | Admit: 2023-11-11 | Discharge: 2023-11-11 | Disposition: A | Discharge status: Home / Self Care | Source: Ambulatory Visit | Attending: Family Medicine | Admitting: Family Medicine

## 2023-11-11 DIAGNOSIS — S22000A Wedge compression fracture of unspecified thoracic vertebra, initial encounter for closed fracture: Secondary | ICD-10-CM | POA: Insufficient documentation

## 2023-11-15 ENCOUNTER — Encounter (INDEPENDENT_AMBULATORY_CARE_PROVIDER_SITE_OTHER): Payer: Self-pay

## 2023-11-25 ENCOUNTER — Telehealth: Payer: Self-pay | Admitting: Student in an Organized Health Care Education/Training Program

## 2023-11-25 NOTE — Telephone Encounter (Signed)
 Returned patient phone call, she has had imaging that reveal muliple compression fx's in her spine and is lining up Kyphoplasty with Ortho.  She states buprenorphine  is covering her leg pain but is not touching her back.  She was wondering if this should be different.  I told her that buprenorphine  is acting through out her body to relieve pain but that maybe the acute pain in her back is more severe and is not enough. Patient verbalizes u/o.  Encouraged to f/up with ortho, since I do not have a provider here today and see if they would write for pain relief until her procedure.  Otherwise, if she still needs us  she can reach out and I will ask Seema or BL.  I did explain that they may not want to do anything different but that we would be glad to ask for her.  Again patient verbalizes u/o information.

## 2023-11-25 NOTE — Telephone Encounter (Signed)
 Patient lvmail asking if she can get pain patches to put on her back she has new fractures. Says the patches work very well for her legs and she is wondering if they would help her back pain. Please advise patient

## 2023-11-28 ENCOUNTER — Other Ambulatory Visit: Payer: Self-pay | Admitting: Family Medicine

## 2023-11-28 DIAGNOSIS — S22000A Wedge compression fracture of unspecified thoracic vertebra, initial encounter for closed fracture: Secondary | ICD-10-CM

## 2023-11-29 ENCOUNTER — Ambulatory Visit
Admission: RE | Admit: 2023-11-29 | Discharge: 2023-11-29 | Disposition: A | Discharge status: Home / Self Care | Source: Ambulatory Visit | Attending: Family Medicine | Admitting: Family Medicine

## 2023-11-29 DIAGNOSIS — S22000A Wedge compression fracture of unspecified thoracic vertebra, initial encounter for closed fracture: Secondary | ICD-10-CM

## 2023-11-29 HISTORY — PX: IR RADIOLOGIST EVAL & MGMT: IMG5224

## 2023-11-29 NOTE — Consult Note (Signed)
 Chief Complaint: Patient was seen in consultation today for back pain at the request of [facility]  Referring Physician(s): Whitaker,Jason Hestle  History of Present Illness: Wendy Arias is a 73 y.o. female with a known history of osteoporosis who was in her usual state of health until several weeks ago when she was lying in bed and attempted to reposition herself and felt acute onset pain in her back between her shoulder blades which radiated around toward her central chest.  The pain has persisted and been insufferable.  Workup confirms a subacute compression fracture of the T6 vertebral body with approximately 80% height loss anteriorly on MRI dated 11/11/2023.  She has chronic pain issues from complex regional pain syndrome in her lower extremities and is on a buprenorphine  patch.  She has been unable to get additional narcotic pain medicines due to the patch.  She feels the patch is doing nothing to alleviate her severe back pain.  She has been using Tylenol  which helps minimally.  Her pain is severe.  She rates it a 9 out of 10 on a 10 point scale.  Her pain is also debilitating.  She scored a 21 out of 24 on the [employer] disability questionnaire.  She normally takes aspirin  and Plavix  but has been holding them for the past week in anticipation of having some procedure to fix her fracture.  She denies lower extremity weakness, paresthesias or other new symptoms.  Past Medical History:  Diagnosis Date   (HFpEF) heart failure with preserved ejection fraction (HCC) 06/07/2004   a.) TTE 06/07/2004: EF 50-55%; inf HK. b.) TTE 04/17/2018: EF 45%; GLS -14.6%; inferoseptal and inferoposterior HK; sev LA enlargement; mild PR, mod TR, sev MR; G2DD. c.) TTE 12/05/2018: EF >55%, mild LVH; mild MR. d.) TTE 12/18/2020: EF >55%; mild LA enlargement; triv TR/PR, mod MR; G1DD; e.) TTE 07/13/2022: EF 55-60%, no RWMAs, mild MR;  f.) TTE 02/02/2023: EF 60-65%, no RMWas, triv MR, G1DD    Acute metabolic encephalopathy    Acute respiratory failure with hypoxia (HCC)    Anemia of chronic disease    Anxiety    Aortic atherosclerosis (HCC)    Asthma    CAD (coronary artery disease) 06/08/2004   a.) NSTEMI 06/05/2004. b.) LHC 06/08/2004: EF 70%; 50% mLAD, 50% dLAD, 99% OM3, 50% pRCA, 50% mRCA; PCI to OM3 placing a DES x 1 (unknown type). c.) Lexi scan 09/14/2021: normal LV function with no ischemia or scar.   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    a.) followed by pain management; on transdermal buprenorphine    COPD (chronic obstructive pulmonary disease) (HCC)    Depression    Diverticulosis    DOE (dyspnea on exertion)    Dysphagia    Empyema of right pleural space (HCC) 02/16/2018   Hepatic cirrhosis (HCC)    History of 2019 novel coronavirus disease (COVID-19) 03/03/2023   HLD (hyperlipidemia)    Hypertension    IDA (iron deficiency anemia)    Infrarenal abdominal aortic aneurysm (AAA) without rupture (HCC) 04/11/2018   a.) CT AP 04/11/2018: 2.5 cm; b.) CT AP 01/05/2022: 2.9 cm; c.) CTA chest 03/03/2023: 2.9 cm   Ischemic cardiomyopathy    a.) TTE 06/07/2004: 50-55%; b.) TTE 04/17/2018: EF 45%; c.) TTE 12/05/2018: >55%; d.) TTE 12/18/2020: >55%; e.) MV 09/14/2021: EF 64%; f.) TTE 07/13/2022: EF 55-60%; g.) TTE 02/02/2023: 60-65%   Long term current use of aspirin     Long term  current use of clopidogrel     NSTEMI (non-ST elevated myocardial infarction) (HCC) 06/05/2004   a.)  PCI on 06/08/2004 placing a DES x1 (unknown type) to 99% lesion in OM3.   Osteopenia    Osteoporosis    Peripheral vascular disease (HCC)    a.) s/p BILATERAL femoral endarterectomies and open angioplasty with BILATERAL EIA stenting 09/16/2021   Pneumonia    PONV (postoperative nausea and vomiting)     Past Surgical History:  Procedure Laterality Date   COLONOSCOPY WITH PROPOFOL  N/A 04/12/2017   Procedure: COLONOSCOPY WITH PROPOFOL ;  Surgeon: Deveron Fly, MD;  Location: [facility] ENDOSCOPY;  Service: Endoscopy;  Laterality: N/A;   CORONARY ANGIOPLASTY WITH STENT PLACEMENT Left 06/08/2004   Procedure: CORONARY ANGIOPLASTY WITH STENT PLACEMENT; Location: [facility]; Surgeon: Thais Fill, MD   ENDARTERECTOMY FEMORAL Bilateral 09/16/2021   Procedure: ENDARTERECTOMY FEMORAL;  Surgeon: Celso College, MD;  Location: [facility] ORS;  Service: Vascular;  Laterality: Bilateral;   ENDOBRONCHIAL ULTRASOUND N/A 11/28/2017   Procedure: ENDOBRONCHIAL ULTRASOUND;  Surgeon: Enos Harts, MD;  Location: [facility] ORS;  Service: Pulmonary;  Laterality: N/A;   ESOPHAGOGASTRODUODENOSCOPY N/A 10/06/2023   Procedure: EGD (ESOPHAGOGASTRODUODENOSCOPY);  Surgeon: Marnee Sink, MD;  Location: [facility] ENDOSCOPY;  Service: Endoscopy;  Laterality: N/A;   INSERTION OF ILIAC STENT Bilateral 09/16/2021   Procedure: INSERTION OF ILIAC STENT;  Surgeon: Celso College, MD;  Location: [facility] ORS;  Service: Vascular;  Laterality: Bilateral;   IR RADIOLOGIST EVAL & MGMT  11/29/2023   LOWER EXTREMITY ANGIOGRAPHY Right 08/12/2021   Procedure: Lower Extremity Angiography;  Surgeon: Celso College, MD;  Location: [facility] INVASIVE CV LAB;  Service: Cardiovascular;  Laterality: Right;   LOWER EXTREMITY ANGIOGRAPHY Left 03/07/2023   Procedure: Lower Extremity Angiography;  Surgeon: Celso College, MD;  Location: [facility] INVASIVE CV LAB;  Service: Cardiovascular;  Laterality: Left;   ORIF ANKLE FRACTURE Left 03/11/2023   Procedure: OPEN REDUCTION INTERNAL FIXATION (ORIF) ANKLE FRACTURE ANKLE TRIMALLEOLAR;  Surgeon: Anell Baptist, DPM;  Location: [facility] ORS;  Service: Orthopedics/Podiatry;  Laterality: Left;  Regional block with general   PARTIAL HYSTERECTOMY N/A    REDUCTION MAMMAPLASTY     VIDEO ASSISTED THORACOSCOPY (VATS)/THOROCOTOMY Right 03/2018   Procedure: VIDEO ASSISTED THORACOSCOPY (VATS)/THOROCOTOMY (partial decortication, loculated empyema); Location: [facility]    Allergies: Penicillins  Medications: Prior to  Admission medications   Medication Sig Start Date End Date Taking? Authorizing Provider  acetaminophen  (TYLENOL ) 500 MG tablet Take 2 tablets (1,000 mg total) by mouth every 8 (eight) hours as needed. 10/06/23  Yes Garrison Kanner, MD  albuterol  (PROVENTIL  HFA;VENTOLIN  HFA) 108 (90 Base) MCG/ACT inhaler Inhale into the lungs every 6 (six) hours as needed for wheezing or shortness of breath.   Yes [provider]  atorvastatin  (LIPITOR) 40 MG tablet Take 40 mg by mouth daily. 05/18/21  Yes [provider]  buprenorphine  (BUTRANS ) 7.5 MCG/HR 1 patch once a week. 11/01/23  Yes [provider]  busPIRone  (BUSPAR ) 5 MG tablet Take 5 mg by mouth 2 (two) times daily.   Yes [provider]  furosemide  (LASIX ) 20 MG tablet Take 20 mg by mouth daily. 04/19/23  Yes [provider]  gabapentin  (NEURONTIN ) 300 MG capsule Take 2 capsules (600 mg total) by mouth at bedtime. Patient taking differently: Take 600 mg by mouth 2 (two) times daily. One capsule in the morning, 2 capsules at bedtime 10/29/21  Yes Brown, Fallon E, NP  lidocaine  (LIDODERM ) 5 % Place 1 patch onto the skin. 10/31/23 11/30/23  Yes [provider]  Magnesium  400 MG CAPS Take 1 tablet by mouth daily.   Yes [provider]  nitroGLYCERIN  (NITROSTAT ) 0.4 MG SL tablet Place under the tongue. 01/20/23 01/20/24 Yes [provider]  TRELEGY ELLIPTA  100-62.5-25 MCG/ACT AEPB Inhale 1 puff into the lungs daily. 05/30/21  Yes Patel, Sona, MD  vitamin B-12 (CYANOCOBALAMIN ) 1000 MCG tablet Take 1,000 mcg by mouth daily.   Yes [provider]  Vitamin E  268 MG (400 UNIT) CAPS Take 400 Units by mouth daily.   Yes [provider]  amitriptyline  (ELAVIL ) 100 MG tablet Take 100 mg by mouth at bedtime. 06/02/22 10/01/23  [provider]  aspirin  EC 81 MG tablet Take 1 tablet (81 mg total) by mouth daily. 08/12/21   Celso College, MD  clopidogrel  (PLAVIX ) 75 MG tablet TAKE 1 TABLET BY  MOUTH DAILY AT 6AM Patient taking differently: Take 75 mg by mouth daily. 04/30/22   Brown, Fallon E, NP  ipratropium-albuterol  (DUONEB) 0.5-2.5 (3) MG/3ML SOLN Take 3 mLs by nebulization every 6 (six) hours as needed. 02/04/23 10/01/23  Montey Apa, DO     Family History  Problem Relation Age of Onset   Breast cancer Mother 23   Hypertension Mother    Dementia Mother    Breast cancer Maternal Aunt 20   Breast cancer Other 35   Hypertension Father    Heart disease Father    Diabetes Father    Diabetes Sister     Social History   Socioeconomic History   Marital status: Divorced    Spouse name: Not on file   Number of children: Not on file   Years of education: Not on file   Highest education level: Not on file  Occupational History   Not on file  Tobacco Use   Smoking status: Every Day    Current packs/day: 0.50    Average packs/day: 0.5 packs/day for 20.0 years (10.0 ttl pk-yrs)    Types: Cigarettes   Smokeless tobacco: Never  Vaping Use   Vaping status: Some Days   Substances: Nicotine , Flavoring  Substance and Sexual Activity   Alcohol use: Never    Alcohol/week: 3.0 standard drinks of alcohol    Types: 1 Glasses of wine, 2 Cans of beer per week    Comment: rarely   Drug use: Never   Sexual activity: Not Currently  Other Topics Concern   Not on file  Social History Narrative   Not on file   Social Drivers of Health   Financial Resource Strain: Low Risk  (07/11/2023)   Received from [facility]   Overall Financial Resource Strain (CARDIA)    Difficulty of Paying Living Expenses: Not hard at all  Food Insecurity: Food Insecurity Present (10/02/2023)   Hunger Vital Sign    Worried About Running Out of Food in the Last Year: Sometimes true    Ran Out of Food in the Last Year: Sometimes true  Transportation Needs: Unmet Transportation Needs (10/02/2023)   PRAPARE - Administrator, Civil Service (Medical): Yes    Lack of  Transportation (Non-Medical): No  Physical Activity: Inactive (02/16/2018)   Exercise Vital Sign    Days of Exercise per Week: 0 days    Minutes of Exercise per Session: 0 min  Stress: Stress Concern Present (02/16/2018)   [employer] of Occupational Health - Occupational Stress Questionnaire    Feeling of Stress : Rather much  Social Connections: Unknown (10/02/2023)  Social Advertising account executive [NHANES]    Frequency of Communication with Friends and Family: More than three times a week    Frequency of Social Gatherings with Friends and Family: More than three times a week    Attends Religious Services: Patient declined    Database administrator or Organizations: Patient declined    Attends Banker Meetings: Patient declined    Marital Status: Divorced     Review of Systems: A 12 point ROS discussed and pertinent positives are indicated in the HPI above.  All other systems are negative.  Review of Systems  Vital Signs: BP 128/84 (BP Location: Left Arm, Patient Position: Sitting, Cuff Size: Normal)   Pulse 89   Temp 98.5 F (36.9 C)   SpO2 90%    Physical Exam Constitutional:      General: She is not in acute distress.    Appearance: Normal appearance. She is normal weight.  HENT:     Head: Normocephalic and atraumatic.  Eyes:     General: No scleral icterus. Cardiovascular:     Rate and Rhythm: Normal rate.  Pulmonary:     Effort: Pulmonary effort is normal.  Abdominal:     General: Abdomen is flat.     Palpations: Abdomen is soft.     Tenderness: There is no abdominal tenderness.  Musculoskeletal:       Back:     Comments: TTP over the T6 spinous process  Skin:    General: Skin is warm and dry.  Neurological:     Mental Status: She is alert and oriented to person, place, and time.  Psychiatric:        Mood and Affect: Mood normal.        Behavior: Behavior normal.       Imaging: IR Radiologist Eval & Mgmt Result Date:  11/29/2023 EXAM: NEW PATIENT OFFICE VISIT CHIEF COMPLAINT: SEE NOTE IN EPIC HISTORY OF PRESENT ILLNESS: SEE NOTE IN EPIC REVIEW OF SYSTEMS: SEE NOTE IN EPIC PHYSICAL EXAMINATION: SEE NOTE IN EPIC ASSESSMENT AND PLAN: SEE NOTE IN EPIC Electronically Signed   By: Fernando Hoyer M.D.   On: 11/29/2023 10:06   MR THORACIC SPINE WO CONTRAST Result Date: 11/22/2023 CLINICAL DATA:  Pain under both breasts radiating around to the back for 1 month. Concern for compression fracture. EXAM: MRI THORACIC SPINE WITHOUT CONTRAST TECHNIQUE: Multiplanar, multisequence MR imaging of the thoracic spine was performed. No intravenous contrast was administered. COMPARISON:  CTA chest 10/01/2023. FINDINGS: Alignment: There is slight exaggeration of the thoracic kyphosis. No significant listhesis. Vertebrae: Compression fracture of T6 with approximately 80% height loss anteriorly which appears increased since the CT in April 2025. Approximately 3 mm retropulsion of T6. Diffuse edema throughout the T6 vertebral body particularly within the inferior aspect. Edema extends into the right pedicle. There is additional focus of edema along the anterior inferior endplate of T5 without significant irregularity or height loss. Additional edema in the right pedicle of T5. Chronic fracture deformity of L1 with significant height loss anteriorly and similar retropulsion. There are additional chronic appearing mild deformities of the superior endplates of T2 through T4. Vertebral body heights otherwise maintained. Cord:  Normal signal and morphology. Paraspinal and other soft tissues: The paraspinal soft tissues are unremarkable. Disc levels: Disc desiccation at multiple levels. There is mild disc height loss at multiple levels in the midthoracic spine. No large disc herniation or severe spinal canal stenosis. Shallow disc bulges at T4-5, T5-6, T6-7,  and T7-8 without significant spinal canal stenosis. Retropulsion of fracture fragments at T6 indents  the ventral thecal sac without significant flattening of the cervical cord or cord signal abnormality. Additional shallow disc bulges in the lower thoracic spine at T10-11 through T12-L1. Retropulsed fracture fragments at L1 indent the ventral thecal sac resulting in mild spinal canal stenosis. IMPRESSION: Acute/subacute compression fracture of T6 with 80% height loss anteriorly and approximately 3 mm retropulsion. Additional edema along the T5 inferior endplate and within the right pedicle of T5 which may reflect nondisplaced fractures versus stress reaction. Similar chronic compression fracture of L1 with retropulsion at the superior endplate resulting in mild spinal canal stenosis. Additional chronic appearing compression deformities of the T2 through T4 superior endplates. No high-grade spinal canal stenosis. No cord compression or cord signal abnormality. Electronically Signed   By: Denny Flack M.D.   On: 11/22/2023 15:23    Labs:  CBC: Recent Labs    03/12/23 0618 10/01/23 1123 10/04/23 0327 10/25/23 1403  WBC 12.5* 6.8 8.7 4.8  HGB 9.4* 12.9 11.8* 11.9*  HCT 29.4* 42.0 37.1 37.8  PLT 245 267 268 321    COAGS: Recent Labs    03/04/23 0521  INR 1.2    BMP: Recent Labs    03/06/23 1025 03/07/23 1302 03/10/23 0458 10/01/23 1123 10/04/23 0327 10/25/23 1403  NA 135  --   --  138 138 138  K 4.3  --   --  4.3 4.2 4.2  CL 102  --   --  102 102 102  CO2 26  --   --  26 28 26   GLUCOSE 157*  --   --  91 96 102*  BUN 24* 29*  --  19 32* 32*  CALCIUM  8.2*  --   --  9.0 8.8* 9.1  CREATININE 0.76 0.83 0.69 1.00 1.02* 1.02*  GFRNONAA >60 >60 >60 59* 58* 58*    LIVER FUNCTION TESTS: Recent Labs    03/03/23 0932 03/04/23 0521 10/01/23 1123 10/04/23 0327  BILITOT 0.6 0.3 0.7 0.5  AST 19 18 21 15   ALT 15 13 14 14   ALKPHOS 107 76 120 95  PROT 6.7 5.8* 7.2 6.3*  ALBUMIN  3.3* 2.8* 4.0 3.3*    TUMOR MARKERS: No results for input(s): "AFPTM", "CEA", "CA199", "CHROMGRNA" in  the last 8760 hours.  Assessment & Plan:   Patient has suffered subacute osteoporotic fracture of the T6 vertebra.   History and exam have demonstrated the following:  Acute/Subacute fracture by imaging dated 11/11/23, Pain on exam concordant with level of fracture, Failure of conservative therapy and pain refractory to narcotic pain mediation, and Significant disability on the [employer] Disability Questionnaire with 21/24 positive symptoms, reflecting significant impact/impairment of (ADLs)   ICD-10-CM Codes that Support Medical Necessity (WelshBlog.at.aspx?articleId=[email or web address])  M80.08XA    Age-related osteoporosis with current pathological fracture, vertebra(e), initial encounter for fracture   Plan:  T6 vertebral body augmentation with balloon kyphoplasty  Post-procedure disposition: outpatient  Medication holds: ASA, Plavix  (already holding)  The patient has suffered a fracture of the T6 vertebral body. It is recommended that patients aged 52 years or older be evaluated for possible testing or treatment of osteoporosis. A copy of this consult report is sent to the patient's referring physician.  Advanced Care Plan: The patient did not want to provide an Advanced Care Plan at the time of this visit     Total time spent on today's visit was over 40 Minutes , including  both face-to-face time and non face-to-face time, personally spent on review of chart (including labs and relevant imaging), discussing further workup and treatment options, referral to specialist if needed, reviewing outside records if pertinent, answering patient questions, and coordinating care regarding T6 osteoporotic compression fracture as well as management strategy.   Electronically Signed: Colman Deans Shaquanda Graves 11/29/2023, 10:16 AM

## 2023-11-30 ENCOUNTER — Other Ambulatory Visit: Payer: Self-pay | Admitting: Interventional Radiology

## 2023-11-30 DIAGNOSIS — S22000A Wedge compression fracture of unspecified thoracic vertebra, initial encounter for closed fracture: Secondary | ICD-10-CM

## 2023-12-05 ENCOUNTER — Telehealth: Payer: Self-pay

## 2023-12-05 NOTE — Discharge Instructions (Signed)

## 2023-12-06 ENCOUNTER — Ambulatory Visit
Admission: RE | Admit: 2023-12-06 | Discharge: 2023-12-06 | Disposition: A | Discharge status: Home / Self Care | Source: Ambulatory Visit | Attending: Interventional Radiology | Admitting: Interventional Radiology

## 2023-12-06 DIAGNOSIS — S22000A Wedge compression fracture of unspecified thoracic vertebra, initial encounter for closed fracture: Secondary | ICD-10-CM

## 2023-12-06 HISTORY — PX: IR KYPHO THORACIC WITH BONE BIOPSY: IMG5518

## 2023-12-06 MED ORDER — CEFAZOLIN SODIUM-DEXTROSE 2-4 GM/100ML-% IV SOLN
2.0000 g | INTRAVENOUS | Status: AC
  Administered 2023-12-06: 2 g via INTRAVENOUS

## 2023-12-06 MED ORDER — MIDAZOLAM HCL 2 MG/2ML IJ SOLN: 1.0000 mg | INTRAMUSCULAR | Status: DC | PRN

## 2023-12-06 MED ORDER — MIDAZOLAM HCL 2 MG/2ML IJ SOLN
INTRAMUSCULAR | Status: DC | PRN
  Administered 2023-12-06: .5 mg via INTRAVENOUS
  Administered 2023-12-06 (×2): 1 mg via INTRAVENOUS
  Administered 2023-12-06: .5 mg via INTRAVENOUS

## 2023-12-06 MED ORDER — FENTANYL CITRATE (PF) 100 MCG/2ML IJ SOLN
INTRAMUSCULAR | Status: DC | PRN
  Administered 2023-12-06 (×2): 25 ug via INTRAVENOUS
  Administered 2023-12-06: 50 ug via INTRAVENOUS
  Administered 2023-12-06: 25 ug via INTRAVENOUS

## 2023-12-06 MED ORDER — LIDOCAINE-EPINEPHRINE 1 %-1:100000 IJ SOLN
20.0000 mL | Freq: Once | INTRAMUSCULAR | Status: AC
  Administered 2023-12-06: 20 mL via INTRADERMAL

## 2023-12-06 MED ORDER — FENTANYL CITRATE PF 50 MCG/ML IJ SOSY: 25.0000 ug | PREFILLED_SYRINGE | INTRAMUSCULAR | Status: DC | PRN

## 2023-12-06 MED ORDER — SODIUM CHLORIDE 0.9 % IV SOLN
INTRAVENOUS | Status: DC
  Administered 2023-12-06: 250 mL via INTRAVENOUS

## 2023-12-06 MED ORDER — ACETAMINOPHEN 10 MG/ML IV SOLN
1000.0000 mg | Freq: Once | INTRAVENOUS | Status: AC
  Administered 2023-12-06: 1000 mg via INTRAVENOUS

## 2023-12-07 ENCOUNTER — Telehealth: Payer: Self-pay

## 2023-12-08 ENCOUNTER — Telehealth: Payer: Self-pay

## 2023-12-13 ENCOUNTER — Telehealth: Payer: Self-pay

## 2023-12-22 ENCOUNTER — Other Ambulatory Visit (HOSPITAL_COMMUNITY): Payer: Self-pay

## 2023-12-22 ENCOUNTER — Encounter: Admitting: Student in an Organized Health Care Education/Training Program

## 2024-01-24 ENCOUNTER — Encounter: Payer: Self-pay | Admitting: Nurse Practitioner

## 2024-01-24 ENCOUNTER — Other Ambulatory Visit (HOSPITAL_COMMUNITY): Payer: Self-pay

## 2024-04-28 DEATH — deceased
